# Patient Record
Sex: Male | Born: 1950 | Race: White | Hispanic: No | Marital: Married | Smoking: Former smoker
Health system: Southern US, Community
[De-identification: ages and names within clinical notes are randomized; demographics above are authoritative.]

## PROBLEM LIST (undated history)

## (undated) DIAGNOSIS — R51 Headache: Secondary | ICD-10-CM

## (undated) DIAGNOSIS — Z860100 Personal history of colon polyps, unspecified: Secondary | ICD-10-CM

## (undated) DIAGNOSIS — K449 Diaphragmatic hernia without obstruction or gangrene: Secondary | ICD-10-CM

## (undated) DIAGNOSIS — N289 Disorder of kidney and ureter, unspecified: Secondary | ICD-10-CM

## (undated) DIAGNOSIS — A088 Other specified intestinal infections: Secondary | ICD-10-CM

## (undated) DIAGNOSIS — B009 Herpesviral infection, unspecified: Secondary | ICD-10-CM

## (undated) DIAGNOSIS — Z86718 Personal history of other venous thrombosis and embolism: Secondary | ICD-10-CM

## (undated) DIAGNOSIS — D689 Coagulation defect, unspecified: Secondary | ICD-10-CM

## (undated) DIAGNOSIS — M1990 Unspecified osteoarthritis, unspecified site: Secondary | ICD-10-CM

## (undated) DIAGNOSIS — Z87442 Personal history of urinary calculi: Secondary | ICD-10-CM

## (undated) DIAGNOSIS — E785 Hyperlipidemia, unspecified: Secondary | ICD-10-CM

## (undated) DIAGNOSIS — I10 Essential (primary) hypertension: Principal | ICD-10-CM

## (undated) DIAGNOSIS — N200 Calculus of kidney: Secondary | ICD-10-CM

## (undated) DIAGNOSIS — IMO0002 Reserved for concepts with insufficient information to code with codable children: Secondary | ICD-10-CM

## (undated) DIAGNOSIS — K219 Gastro-esophageal reflux disease without esophagitis: Secondary | ICD-10-CM

## (undated) HISTORY — PX: GALLBLADDER SURGERY: SHX652

## (undated) HISTORY — DX: Diaphragmatic hernia without obstruction or gangrene: K44.9

## (undated) HISTORY — DX: Personal history of colon polyps, unspecified: Z86.0100

## (undated) HISTORY — DX: Herpesviral infection, unspecified: B00.9

## (undated) HISTORY — DX: Headache: R51

## (undated) HISTORY — PX: FINGER SURGERY: SHX640

## (undated) HISTORY — DX: Other specified intestinal infections: A08.8

## (undated) HISTORY — DX: Calculus of kidney: N20.0

## (undated) HISTORY — DX: Hyperlipidemia, unspecified: E78.5

## (undated) HISTORY — PX: APPENDECTOMY: SHX54

## (undated) HISTORY — PX: CHOLECYSTECTOMY: SHX55

## (undated) HISTORY — PX: TONSILLECTOMY: SUR1361

## (undated) HISTORY — PX: LITHOTRIPSY: SUR834

## (undated) HISTORY — PX: SHOULDER SURGERY: SHX246

## (undated) HISTORY — DX: Essential (primary) hypertension: I10

## (undated) HISTORY — DX: Personal history of colonic polyps: Z86.010

## (undated) HISTORY — DX: Coagulation defect, unspecified: D68.9

## (undated) HISTORY — PX: CERVICAL DISC SURGERY: SHX588

## (undated) HISTORY — DX: Gastro-esophageal reflux disease without esophagitis: K21.9

## (undated) HISTORY — DX: Reserved for concepts with insufficient information to code with codable children: IMO0002

## (undated) HISTORY — DX: Personal history of other venous thrombosis and embolism: Z86.718

## (undated) HISTORY — DX: Personal history of urinary calculi: Z87.442

---

## 1997-06-13 ENCOUNTER — Emergency Department (HOSPITAL_COMMUNITY): Admission: EM | Admit: 1997-06-13 | Discharge: 1997-06-13 | Payer: Self-pay | Admitting: Emergency Medicine

## 1997-12-16 ENCOUNTER — Emergency Department (HOSPITAL_COMMUNITY): Admission: EM | Admit: 1997-12-16 | Discharge: 1997-12-16 | Payer: Self-pay | Admitting: Emergency Medicine

## 1997-12-16 ENCOUNTER — Encounter: Payer: Self-pay | Admitting: Emergency Medicine

## 1998-06-03 ENCOUNTER — Ambulatory Visit (HOSPITAL_COMMUNITY): Admission: RE | Admit: 1998-06-03 | Discharge: 1998-06-03 | Payer: Self-pay | Admitting: Gastroenterology

## 1998-06-03 ENCOUNTER — Encounter: Payer: Self-pay | Admitting: Gastroenterology

## 1998-07-29 ENCOUNTER — Ambulatory Visit (HOSPITAL_COMMUNITY): Admission: RE | Admit: 1998-07-29 | Discharge: 1998-07-29 | Payer: Self-pay | Admitting: Gastroenterology

## 1998-07-29 ENCOUNTER — Encounter: Payer: Self-pay | Admitting: Gastroenterology

## 1998-10-01 ENCOUNTER — Ambulatory Visit (HOSPITAL_COMMUNITY): Admission: RE | Admit: 1998-10-01 | Discharge: 1998-10-01 | Payer: Self-pay | Admitting: Gastroenterology

## 1998-10-01 ENCOUNTER — Encounter (INDEPENDENT_AMBULATORY_CARE_PROVIDER_SITE_OTHER): Payer: Self-pay | Admitting: Specialist

## 1999-08-24 ENCOUNTER — Emergency Department (HOSPITAL_COMMUNITY): Admission: EM | Admit: 1999-08-24 | Discharge: 1999-08-25 | Payer: Self-pay | Admitting: Emergency Medicine

## 2000-02-15 ENCOUNTER — Emergency Department (HOSPITAL_COMMUNITY): Admission: EM | Admit: 2000-02-15 | Discharge: 2000-02-15 | Payer: Self-pay | Admitting: Emergency Medicine

## 2000-02-15 ENCOUNTER — Encounter: Payer: Self-pay | Admitting: Emergency Medicine

## 2000-06-29 ENCOUNTER — Encounter: Payer: Self-pay | Admitting: Neurosurgery

## 2000-06-29 ENCOUNTER — Ambulatory Visit (HOSPITAL_COMMUNITY): Admission: RE | Admit: 2000-06-29 | Discharge: 2000-06-29 | Payer: Self-pay | Admitting: Neurosurgery

## 2000-12-31 ENCOUNTER — Emergency Department (HOSPITAL_COMMUNITY): Admission: EM | Admit: 2000-12-31 | Discharge: 2001-01-01 | Payer: Self-pay | Admitting: Emergency Medicine

## 2001-01-28 ENCOUNTER — Encounter: Payer: Self-pay | Admitting: Family Medicine

## 2001-01-28 ENCOUNTER — Encounter: Admission: RE | Admit: 2001-01-28 | Discharge: 2001-01-28 | Payer: Self-pay | Admitting: Family Medicine

## 2001-08-14 ENCOUNTER — Encounter: Payer: Self-pay | Admitting: Family Medicine

## 2001-08-14 ENCOUNTER — Encounter: Admission: RE | Admit: 2001-08-14 | Discharge: 2001-08-14 | Payer: Self-pay | Admitting: Family Medicine

## 2001-08-24 ENCOUNTER — Ambulatory Visit (HOSPITAL_COMMUNITY): Admission: RE | Admit: 2001-08-24 | Discharge: 2001-08-24 | Payer: Self-pay | Admitting: Gastroenterology

## 2001-08-24 ENCOUNTER — Encounter: Payer: Self-pay | Admitting: Gastroenterology

## 2001-08-24 ENCOUNTER — Encounter: Payer: Self-pay | Admitting: Nurse Practitioner

## 2002-04-02 ENCOUNTER — Emergency Department (HOSPITAL_COMMUNITY): Admission: EM | Admit: 2002-04-02 | Discharge: 2002-04-02 | Payer: Self-pay | Admitting: Emergency Medicine

## 2002-04-02 ENCOUNTER — Encounter: Payer: Self-pay | Admitting: Emergency Medicine

## 2002-06-04 ENCOUNTER — Encounter: Admission: RE | Admit: 2002-06-04 | Discharge: 2002-09-02 | Payer: Self-pay | Admitting: Family Medicine

## 2002-09-18 ENCOUNTER — Encounter: Payer: Self-pay | Admitting: Gastroenterology

## 2002-09-18 ENCOUNTER — Encounter: Admission: RE | Admit: 2002-09-18 | Discharge: 2002-09-18 | Payer: Self-pay | Admitting: Gastroenterology

## 2002-09-27 ENCOUNTER — Ambulatory Visit (HOSPITAL_COMMUNITY): Admission: RE | Admit: 2002-09-27 | Discharge: 2002-09-27 | Payer: Self-pay | Admitting: Gastroenterology

## 2002-09-27 ENCOUNTER — Encounter: Payer: Self-pay | Admitting: Gastroenterology

## 2002-10-23 ENCOUNTER — Encounter: Admission: RE | Admit: 2002-10-23 | Discharge: 2002-10-23 | Payer: Self-pay | Admitting: Gastroenterology

## 2002-10-23 ENCOUNTER — Encounter: Payer: Self-pay | Admitting: Gastroenterology

## 2002-10-31 ENCOUNTER — Encounter: Payer: Self-pay | Admitting: Cardiology

## 2002-10-31 ENCOUNTER — Ambulatory Visit (HOSPITAL_COMMUNITY): Admission: RE | Admit: 2002-10-31 | Discharge: 2002-10-31 | Payer: Self-pay | Admitting: Cardiology

## 2002-11-08 ENCOUNTER — Encounter: Payer: Self-pay | Admitting: Nurse Practitioner

## 2002-11-08 ENCOUNTER — Ambulatory Visit (HOSPITAL_COMMUNITY): Admission: RE | Admit: 2002-11-08 | Discharge: 2002-11-08 | Payer: Self-pay | Admitting: Gastroenterology

## 2002-12-16 ENCOUNTER — Emergency Department (HOSPITAL_COMMUNITY): Admission: EM | Admit: 2002-12-16 | Discharge: 2002-12-16 | Payer: Self-pay | Admitting: Emergency Medicine

## 2003-02-12 ENCOUNTER — Encounter: Payer: Self-pay | Admitting: Nurse Practitioner

## 2003-08-20 ENCOUNTER — Encounter: Admission: RE | Admit: 2003-08-20 | Discharge: 2003-08-20 | Payer: Self-pay

## 2003-09-24 ENCOUNTER — Encounter: Payer: Self-pay | Admitting: Nurse Practitioner

## 2003-09-24 ENCOUNTER — Ambulatory Visit (HOSPITAL_COMMUNITY): Admission: RE | Admit: 2003-09-24 | Discharge: 2003-09-24 | Payer: Self-pay | Admitting: Gastroenterology

## 2004-02-01 ENCOUNTER — Ambulatory Visit: Payer: Self-pay | Admitting: Family Medicine

## 2004-02-03 ENCOUNTER — Ambulatory Visit: Payer: Self-pay | Admitting: Family Medicine

## 2004-02-14 ENCOUNTER — Ambulatory Visit: Payer: Self-pay | Admitting: Family Medicine

## 2004-04-10 ENCOUNTER — Ambulatory Visit: Payer: Self-pay | Admitting: Family Medicine

## 2004-07-29 ENCOUNTER — Ambulatory Visit (HOSPITAL_COMMUNITY): Admission: RE | Admit: 2004-07-29 | Discharge: 2004-07-29 | Payer: Self-pay | Admitting: Neurosurgery

## 2004-11-26 ENCOUNTER — Ambulatory Visit: Payer: Self-pay | Admitting: Family Medicine

## 2005-01-18 ENCOUNTER — Ambulatory Visit: Payer: Self-pay | Admitting: Family Medicine

## 2005-02-23 ENCOUNTER — Ambulatory Visit: Payer: Self-pay | Admitting: Family Medicine

## 2005-02-26 ENCOUNTER — Encounter: Payer: Self-pay | Admitting: Nurse Practitioner

## 2005-02-26 ENCOUNTER — Ambulatory Visit: Payer: Self-pay | Admitting: Family Medicine

## 2005-02-26 ENCOUNTER — Encounter: Admission: RE | Admit: 2005-02-26 | Discharge: 2005-02-26 | Payer: Self-pay | Admitting: Family Medicine

## 2005-03-03 ENCOUNTER — Ambulatory Visit: Payer: Self-pay | Admitting: Family Medicine

## 2005-05-05 ENCOUNTER — Ambulatory Visit: Payer: Self-pay | Admitting: Family Medicine

## 2005-05-24 ENCOUNTER — Encounter: Admission: RE | Admit: 2005-05-24 | Discharge: 2005-05-24 | Payer: Self-pay | Admitting: Gastroenterology

## 2005-06-14 ENCOUNTER — Encounter: Payer: Self-pay | Admitting: Nurse Practitioner

## 2005-06-22 ENCOUNTER — Ambulatory Visit: Payer: Self-pay | Admitting: Family Medicine

## 2005-07-02 ENCOUNTER — Encounter: Payer: Self-pay | Admitting: Nurse Practitioner

## 2005-08-23 ENCOUNTER — Ambulatory Visit (HOSPITAL_COMMUNITY): Admission: RE | Admit: 2005-08-23 | Discharge: 2005-08-23 | Payer: Self-pay | Admitting: Urology

## 2005-10-07 ENCOUNTER — Encounter: Payer: Self-pay | Admitting: Nurse Practitioner

## 2005-10-18 ENCOUNTER — Encounter: Admission: RE | Admit: 2005-10-18 | Discharge: 2005-10-18 | Payer: Self-pay | Admitting: Gastroenterology

## 2005-11-14 ENCOUNTER — Encounter: Admission: RE | Admit: 2005-11-14 | Discharge: 2005-11-14 | Payer: Self-pay | Admitting: Orthopedic Surgery

## 2006-01-31 ENCOUNTER — Ambulatory Visit: Payer: Self-pay | Admitting: Family Medicine

## 2006-04-26 ENCOUNTER — Ambulatory Visit: Payer: Self-pay | Admitting: Family Medicine

## 2006-06-15 ENCOUNTER — Ambulatory Visit: Payer: Self-pay | Admitting: Family Medicine

## 2006-06-22 ENCOUNTER — Ambulatory Visit: Payer: Self-pay | Admitting: Family Medicine

## 2006-06-22 LAB — CONVERTED CEMR LAB
ALT: 38 units/L (ref 0–40)
Albumin: 4 g/dL (ref 3.5–5.2)
Alkaline Phosphatase: 45 units/L (ref 39–117)
BUN: 19 mg/dL (ref 6–23)
Basophils Absolute: 0 10*3/uL (ref 0.0–0.1)
Basophils Relative: 0.7 % (ref 0.0–1.0)
CO2: 30 meq/L (ref 19–32)
Calcium: 9.7 mg/dL (ref 8.4–10.5)
Cholesterol: 192 mg/dL (ref 0–200)
Direct LDL: 119.6 mg/dL
GFR calc Af Amer: 62 mL/min
HDL: 30.9 mg/dL — ABNORMAL LOW (ref >39.0)
Lymphocytes Relative: 29.1 % (ref 12.0–46.0)
MCHC: 34.9 g/dL (ref 30.0–36.0)
Monocytes Relative: 9.2 % (ref 3.0–11.0)
Neutro Abs: 3 10*3/uL (ref 1.4–7.7)
Platelets: 242 10*3/uL (ref 150–400)
RDW: 12.5 % (ref 11.5–14.6)
TSH: 2.13 microintl units/mL (ref 0.35–5.50)
Total CHOL/HDL Ratio: 6.2
Total Protein: 6.7 g/dL (ref 6.0–8.3)
VLDL: 45 mg/dL — ABNORMAL HIGH (ref 0–40)

## 2006-07-01 ENCOUNTER — Ambulatory Visit: Payer: Self-pay | Admitting: Family Medicine

## 2006-08-15 ENCOUNTER — Encounter: Payer: Self-pay | Admitting: Family Medicine

## 2006-10-24 ENCOUNTER — Ambulatory Visit: Payer: Self-pay | Admitting: Family Medicine

## 2006-10-24 DIAGNOSIS — Z87442 Personal history of urinary calculi: Secondary | ICD-10-CM | POA: Insufficient documentation

## 2006-10-24 DIAGNOSIS — B009 Herpesviral infection, unspecified: Secondary | ICD-10-CM | POA: Insufficient documentation

## 2006-10-24 DIAGNOSIS — K219 Gastro-esophageal reflux disease without esophagitis: Secondary | ICD-10-CM

## 2006-10-24 DIAGNOSIS — Z8719 Personal history of other diseases of the digestive system: Secondary | ICD-10-CM

## 2006-10-24 DIAGNOSIS — E785 Hyperlipidemia, unspecified: Secondary | ICD-10-CM | POA: Insufficient documentation

## 2006-10-24 DIAGNOSIS — Z87898 Personal history of other specified conditions: Secondary | ICD-10-CM | POA: Insufficient documentation

## 2006-10-24 DIAGNOSIS — G43909 Migraine, unspecified, not intractable, without status migrainosus: Secondary | ICD-10-CM

## 2006-10-27 ENCOUNTER — Telehealth (INDEPENDENT_AMBULATORY_CARE_PROVIDER_SITE_OTHER): Payer: Self-pay | Admitting: *Deleted

## 2006-10-28 ENCOUNTER — Telehealth: Payer: Self-pay | Admitting: Family Medicine

## 2006-10-31 DIAGNOSIS — IMO0002 Reserved for concepts with insufficient information to code with codable children: Secondary | ICD-10-CM | POA: Insufficient documentation

## 2006-11-02 ENCOUNTER — Telehealth (INDEPENDENT_AMBULATORY_CARE_PROVIDER_SITE_OTHER): Payer: Self-pay | Admitting: *Deleted

## 2006-11-03 ENCOUNTER — Telehealth (INDEPENDENT_AMBULATORY_CARE_PROVIDER_SITE_OTHER): Payer: Self-pay | Admitting: *Deleted

## 2006-11-16 ENCOUNTER — Telehealth: Payer: Self-pay | Admitting: Internal Medicine

## 2007-01-18 ENCOUNTER — Ambulatory Visit: Payer: Self-pay | Admitting: Family Medicine

## 2007-01-26 ENCOUNTER — Telehealth: Payer: Self-pay | Admitting: Family Medicine

## 2007-01-27 ENCOUNTER — Telehealth: Payer: Self-pay | Admitting: Family Medicine

## 2007-02-01 ENCOUNTER — Telehealth: Payer: Self-pay | Admitting: Family Medicine

## 2007-03-01 ENCOUNTER — Inpatient Hospital Stay (HOSPITAL_COMMUNITY): Admission: AD | Admit: 2007-03-01 | Discharge: 2007-03-03 | Payer: Self-pay | Admitting: Family Medicine

## 2007-03-01 ENCOUNTER — Ambulatory Visit: Payer: Self-pay | Admitting: Internal Medicine

## 2007-03-01 ENCOUNTER — Ambulatory Visit: Payer: Self-pay | Admitting: Family Medicine

## 2007-03-01 ENCOUNTER — Ambulatory Visit: Payer: Self-pay

## 2007-03-01 DIAGNOSIS — I82409 Acute embolism and thrombosis of unspecified deep veins of unspecified lower extremity: Secondary | ICD-10-CM | POA: Insufficient documentation

## 2007-03-02 LAB — CONVERTED CEMR LAB
ALT: 31 units/L (ref 0–53)
AST: 24 units/L (ref 0–37)
Basophils Relative: 1 % (ref 0.0–1.0)
Bilirubin, Direct: 0.1 mg/dL (ref 0.0–0.3)
Calcium: 9.4 mg/dL (ref 8.4–10.5)
Chloride: 104 meq/L (ref 96–112)
Creatinine, Ser: 1.2 mg/dL (ref 0.4–1.5)
Eosinophils Relative: 2.7 % (ref 0.0–5.0)
GFR calc non Af Amer: 66 mL/min
Glucose, Bld: 119 mg/dL — ABNORMAL HIGH (ref 70–99)
HCT: 44.2 % (ref 39.0–52.0)
INR: 1.1 — ABNORMAL HIGH (ref 0.8–1.0)
Neutrophils Relative %: 64.1 % (ref 43.0–77.0)
Platelets: 285 10*3/uL (ref 150–400)
Prothrombin Time: 12.9 s (ref 10.9–13.3)
RBC: 5.02 M/uL (ref 4.22–5.81)
RDW: 12.4 % (ref 11.5–14.6)
Sodium: 138 meq/L (ref 135–145)
Total Bilirubin: 1 mg/dL (ref 0.3–1.2)
WBC: 6.7 10*3/uL (ref 4.5–10.5)
aPTT: 31.7 s — ABNORMAL HIGH (ref 21.7–29.8)

## 2007-03-06 ENCOUNTER — Ambulatory Visit: Payer: Self-pay | Admitting: Family Medicine

## 2007-03-06 DIAGNOSIS — Z86718 Personal history of other venous thrombosis and embolism: Secondary | ICD-10-CM | POA: Insufficient documentation

## 2007-03-07 ENCOUNTER — Ambulatory Visit: Payer: Self-pay | Admitting: Hematology & Oncology

## 2007-03-08 LAB — CONVERTED CEMR LAB
AntiThromb III Func: 123 % (ref 76–126)
Anticardiolipin IgA: <7 (ref <13)
Anticardiolipin IgM: <7 (ref <10)
Homocysteine: 17.1 micromoles/L — ABNORMAL HIGH (ref 4.0–15.4)
Protein S Ag, Total: 190 % — ABNORMAL HIGH (ref 70–140)

## 2007-03-15 ENCOUNTER — Ambulatory Visit: Payer: Self-pay | Admitting: Family Medicine

## 2007-03-15 LAB — CONVERTED CEMR LAB: Prothrombin Time: 21 s

## 2007-03-29 ENCOUNTER — Ambulatory Visit: Payer: Self-pay | Admitting: Family Medicine

## 2007-03-29 LAB — CONVERTED CEMR LAB: INR: 3.8

## 2007-04-07 ENCOUNTER — Encounter: Payer: Self-pay | Admitting: Family Medicine

## 2007-04-10 LAB — BASIC METABOLIC PANEL
CO2: 26 mEq/L (ref 19–32)
Calcium: 9.1 mg/dL (ref 8.4–10.5)
Sodium: 139 mEq/L (ref 135–145)

## 2007-04-11 ENCOUNTER — Ambulatory Visit (HOSPITAL_COMMUNITY): Admission: RE | Admit: 2007-04-11 | Discharge: 2007-04-11 | Payer: Self-pay | Admitting: Hematology & Oncology

## 2007-04-12 ENCOUNTER — Encounter: Payer: Self-pay | Admitting: Family Medicine

## 2007-04-12 ENCOUNTER — Ambulatory Visit: Payer: Self-pay | Admitting: Vascular Surgery

## 2007-04-13 ENCOUNTER — Ambulatory Visit: Payer: Self-pay | Admitting: Family Medicine

## 2007-04-13 LAB — CONVERTED CEMR LAB
INR: 2.3
Prothrombin Time: 18.6 s

## 2007-04-14 ENCOUNTER — Telehealth: Payer: Self-pay | Admitting: Family Medicine

## 2007-05-02 ENCOUNTER — Telehealth: Payer: Self-pay | Admitting: Family Medicine

## 2007-05-04 ENCOUNTER — Ambulatory Visit: Payer: Self-pay | Admitting: Hematology & Oncology

## 2007-05-08 ENCOUNTER — Encounter: Payer: Self-pay | Admitting: Family Medicine

## 2007-05-08 LAB — CBC WITH DIFFERENTIAL/PLATELET
BASO%: 0.1 % (ref 0.0–2.0)
Basophils Absolute: 0 10*3/uL (ref 0.0–0.1)
Eosinophils Absolute: 0.2 10*3/uL (ref 0.0–0.5)
HCT: 41.2 % (ref 38.7–49.9)
HGB: 14.4 g/dL (ref 13.0–17.1)
MCHC: 35 g/dL (ref 32.0–35.9)
MONO#: 0.4 10*3/uL (ref 0.1–0.9)
NEUT#: 4 10*3/uL (ref 1.5–6.5)
NEUT%: 67.5 % (ref 40.0–75.0)
WBC: 5.9 10*3/uL (ref 4.0–10.0)
lymph#: 1.3 10*3/uL (ref 0.9–3.3)

## 2007-05-08 LAB — D-DIMER, QUANTITATIVE: D-Dimer, Quant: 0.22 ug/mL-FEU (ref 0.00–0.48)

## 2007-05-08 LAB — PROTIME-INR: INR: 2.7 (ref 2.00–3.50)

## 2007-05-09 ENCOUNTER — Ambulatory Visit: Payer: Self-pay | Admitting: Family Medicine

## 2007-06-06 ENCOUNTER — Ambulatory Visit: Payer: Self-pay | Admitting: Family Medicine

## 2007-06-06 LAB — CONVERTED CEMR LAB: INR: 3.8

## 2007-06-12 ENCOUNTER — Encounter: Admission: RE | Admit: 2007-06-12 | Discharge: 2007-06-12 | Payer: Self-pay | Admitting: Family Medicine

## 2007-06-19 ENCOUNTER — Ambulatory Visit (HOSPITAL_COMMUNITY): Admission: RE | Admit: 2007-06-19 | Discharge: 2007-06-19 | Payer: Self-pay | Admitting: Hematology & Oncology

## 2007-06-21 ENCOUNTER — Ambulatory Visit: Payer: Self-pay | Admitting: Family Medicine

## 2007-06-21 DIAGNOSIS — M25569 Pain in unspecified knee: Secondary | ICD-10-CM | POA: Insufficient documentation

## 2007-06-26 ENCOUNTER — Ambulatory Visit: Payer: Self-pay | Admitting: Hematology & Oncology

## 2007-06-28 ENCOUNTER — Encounter: Payer: Self-pay | Admitting: Family Medicine

## 2007-07-19 ENCOUNTER — Ambulatory Visit: Payer: Self-pay | Admitting: Family Medicine

## 2007-07-20 LAB — CONVERTED CEMR LAB
ALT: 44 units/L (ref 0–53)
AST: 39 units/L — ABNORMAL HIGH (ref 0–37)
Albumin: 4 g/dL (ref 3.5–5.2)
BUN: 18 mg/dL (ref 6–23)
CO2: 26 meq/L (ref 19–32)
Chloride: 108 meq/L (ref 96–112)
Cholesterol: 204 mg/dL (ref 0–200)
Creatinine, Ser: 1.4 mg/dL (ref 0.4–1.5)
Direct LDL: 137.1 mg/dL
HCT: 43.3 % (ref 39.0–52.0)
Hemoglobin: 15.1 g/dL (ref 13.0–17.0)
MCHC: 34.9 g/dL (ref 30.0–36.0)
Monocytes Absolute: 0.5 10*3/uL (ref 0.1–1.0)
Neutro Abs: 2.5 10*3/uL (ref 1.4–7.7)
Platelets: 232 10*3/uL (ref 150–400)
RDW: 13.1 % (ref 11.5–14.6)
Total CHOL/HDL Ratio: 5.9
Triglycerides: 229 mg/dL (ref 0–149)

## 2007-08-16 ENCOUNTER — Ambulatory Visit: Payer: Self-pay | Admitting: Family Medicine

## 2007-08-16 LAB — CONVERTED CEMR LAB: INR: 3.1

## 2007-09-13 ENCOUNTER — Ambulatory Visit: Payer: Self-pay | Admitting: Family Medicine

## 2007-09-13 LAB — CONVERTED CEMR LAB: INR: 3.2

## 2007-10-06 ENCOUNTER — Ambulatory Visit: Payer: Self-pay | Admitting: Hematology & Oncology

## 2007-10-10 ENCOUNTER — Ambulatory Visit (HOSPITAL_COMMUNITY): Admission: RE | Admit: 2007-10-10 | Discharge: 2007-10-10 | Payer: Self-pay | Admitting: Hematology & Oncology

## 2007-10-10 LAB — COMPREHENSIVE METABOLIC PANEL WITH GFR
ALT: 40 U/L (ref 0–53)
AST: 36 U/L (ref 0–37)
Albumin: 4 g/dL (ref 3.5–5.2)
Alkaline Phosphatase: 55 U/L (ref 39–117)
BUN: 11 mg/dL (ref 6–23)
CO2: 28 meq/L (ref 19–32)
Calcium: 9 mg/dL (ref 8.4–10.5)
Chloride: 106 meq/L (ref 96–112)
Creatinine, Ser: 1.35 mg/dL (ref 0.40–1.50)
Glucose, Bld: 100 mg/dL — ABNORMAL HIGH (ref 70–99)
Potassium: 4 meq/L (ref 3.5–5.3)
Sodium: 140 meq/L (ref 135–145)
Total Bilirubin: 1 mg/dL (ref 0.3–1.2)
Total Protein: 6.6 g/dL (ref 6.0–8.3)

## 2007-10-11 ENCOUNTER — Ambulatory Visit: Payer: Self-pay | Admitting: Family Medicine

## 2007-10-12 LAB — CONVERTED CEMR LAB
Testosterone: 273.87 ng/dL — ABNORMAL LOW (ref 350.00–890)
Vitamin B-12: 396 pg/mL (ref 211–911)

## 2007-10-17 ENCOUNTER — Ambulatory Visit: Payer: Self-pay | Admitting: Hematology & Oncology

## 2007-10-18 ENCOUNTER — Encounter: Payer: Self-pay | Admitting: Family Medicine

## 2007-10-18 ENCOUNTER — Ambulatory Visit (HOSPITAL_BASED_OUTPATIENT_CLINIC_OR_DEPARTMENT_OTHER): Admission: RE | Admit: 2007-10-18 | Discharge: 2007-10-18 | Payer: Self-pay | Admitting: Hematology & Oncology

## 2007-11-08 ENCOUNTER — Ambulatory Visit: Payer: Self-pay | Admitting: Family Medicine

## 2007-11-08 LAB — CONVERTED CEMR LAB: INR: 2.9

## 2007-11-17 ENCOUNTER — Emergency Department (HOSPITAL_COMMUNITY): Admission: EM | Admit: 2007-11-17 | Discharge: 2007-11-17 | Payer: Self-pay | Admitting: Family Medicine

## 2007-11-17 ENCOUNTER — Telehealth: Payer: Self-pay | Admitting: Family Medicine

## 2007-12-08 ENCOUNTER — Ambulatory Visit: Payer: Self-pay | Admitting: Family Medicine

## 2007-12-08 LAB — CONVERTED CEMR LAB: Prothrombin Time: 19.2 s

## 2008-01-04 ENCOUNTER — Ambulatory Visit: Payer: Self-pay | Admitting: Family Medicine

## 2008-01-10 ENCOUNTER — Ambulatory Visit: Payer: Self-pay | Admitting: Family Medicine

## 2008-01-24 ENCOUNTER — Ambulatory Visit: Payer: Self-pay | Admitting: Hematology & Oncology

## 2008-01-25 ENCOUNTER — Encounter: Payer: Self-pay | Admitting: Family Medicine

## 2008-01-25 LAB — CBC WITH DIFFERENTIAL (CANCER CENTER ONLY)
BASO%: 0.9 % (ref 0.0–2.0)
EOS%: 3.5 % (ref 0.0–7.0)
Eosinophils Absolute: 0.2 10*3/uL (ref 0.0–0.5)
LYMPH%: 26.3 % (ref 14.0–48.0)
MCH: 29.5 pg (ref 28.0–33.4)
MCHC: 34.3 g/dL (ref 32.0–35.9)
MCV: 86 fL (ref 82–98)
MONO%: 7 % (ref 0.0–13.0)
NEUT#: 3.5 10*3/uL (ref 1.5–6.5)
Platelets: 217 10*3/uL (ref 145–400)
RBC: 5.26 10*6/uL (ref 4.20–5.70)
RDW: 11.8 % (ref 10.5–14.6)

## 2008-01-25 LAB — PROTIME-INR (CHCC SATELLITE)
INR: 2.9 (ref 2.0–3.5)
Protime: 34.8 Seconds — ABNORMAL HIGH (ref 10.6–13.4)

## 2008-02-29 ENCOUNTER — Ambulatory Visit: Payer: Self-pay | Admitting: Family Medicine

## 2008-02-29 LAB — CONVERTED CEMR LAB: Prothrombin Time: 18.1 s

## 2008-03-14 ENCOUNTER — Ambulatory Visit: Payer: Self-pay | Admitting: Family Medicine

## 2008-03-14 LAB — CONVERTED CEMR LAB
INR: 3.1
Prothrombin Time: 21.1 s

## 2008-03-28 ENCOUNTER — Telehealth: Payer: Self-pay | Admitting: Family Medicine

## 2008-04-08 ENCOUNTER — Ambulatory Visit: Payer: Self-pay | Admitting: Family Medicine

## 2008-04-08 LAB — CONVERTED CEMR LAB: Prothrombin Time: 21.6 s

## 2008-05-07 ENCOUNTER — Ambulatory Visit: Payer: Self-pay | Admitting: Family Medicine

## 2008-05-07 LAB — CONVERTED CEMR LAB: INR: 2.7

## 2008-06-04 ENCOUNTER — Ambulatory Visit: Payer: Self-pay | Admitting: Family Medicine

## 2008-07-03 ENCOUNTER — Ambulatory Visit: Payer: Self-pay | Admitting: Family Medicine

## 2008-07-03 LAB — CONVERTED CEMR LAB: INR: 3.6

## 2008-07-17 ENCOUNTER — Ambulatory Visit: Payer: Self-pay | Admitting: Hematology & Oncology

## 2008-07-18 ENCOUNTER — Encounter: Payer: Self-pay | Admitting: Family Medicine

## 2008-07-18 LAB — COMPREHENSIVE METABOLIC PANEL
Albumin: 4.4 g/dL (ref 3.5–5.2)
Alkaline Phosphatase: 52 U/L (ref 39–117)
BUN: 22 mg/dL (ref 6–23)
CO2: 25 mEq/L (ref 19–32)
Glucose, Bld: 70 mg/dL (ref 70–99)
Potassium: 4.3 mEq/L (ref 3.5–5.3)
Sodium: 139 mEq/L (ref 135–145)
Total Protein: 7 g/dL (ref 6.0–8.3)

## 2008-07-18 LAB — CBC WITH DIFFERENTIAL (CANCER CENTER ONLY)
BASO#: 0 10*3/uL (ref 0.0–0.2)
EOS%: 2.9 % (ref 0.0–7.0)
HGB: 14.1 g/dL (ref 13.0–17.1)
LYMPH%: 28.8 % (ref 14.0–48.0)
MCH: 29.5 pg (ref 28.0–33.4)
MCHC: 33.1 g/dL (ref 32.0–35.9)
MCV: 89 fL (ref 82–98)
MONO%: 7.5 % (ref 0.0–13.0)
NEUT#: 2.9 10*3/uL (ref 1.5–6.5)

## 2008-07-18 LAB — PROTIME-INR (CHCC SATELLITE): Protime: 33.6 Seconds — ABNORMAL HIGH (ref 10.6–13.4)

## 2008-07-18 LAB — LACTATE DEHYDROGENASE: LDH: 171 U/L (ref 94–250)

## 2008-07-24 ENCOUNTER — Ambulatory Visit: Payer: Self-pay | Admitting: Family Medicine

## 2008-07-24 LAB — CONVERTED CEMR LAB
Bilirubin Urine: NEGATIVE
Ketones, urine, test strip: NEGATIVE
Nitrite: NEGATIVE
Specific Gravity, Urine: 1.025
pH: 6

## 2008-07-26 LAB — CONVERTED CEMR LAB
Albumin: 4.1 g/dL (ref 3.5–5.2)
Alkaline Phosphatase: 55 units/L (ref 39–117)
BUN: 20 mg/dL (ref 6–23)
Basophils Absolute: 0 10*3/uL (ref 0.0–0.1)
Basophils Relative: 0.3 % (ref 0.0–3.0)
Bilirubin, Direct: 0.1 mg/dL (ref 0.0–0.3)
CO2: 30 meq/L (ref 19–32)
Calcium: 8.9 mg/dL (ref 8.4–10.5)
Chloride: 107 meq/L (ref 96–112)
Cholesterol: 192 mg/dL (ref 0–200)
Creatinine, Ser: 1.4 mg/dL (ref 0.4–1.5)
Eosinophils Absolute: 0.1 10*3/uL (ref 0.0–0.7)
Glucose, Bld: 97 mg/dL (ref 70–99)
HDL: 38.3 mg/dL — ABNORMAL LOW (ref >39.00)
Lymphocytes Relative: 30.7 % (ref 12.0–46.0)
MCHC: 35.2 g/dL (ref 30.0–36.0)
MCV: 88 fL (ref 78.0–100.0)
Monocytes Absolute: 0.4 10*3/uL (ref 0.1–1.0)
Neutrophils Relative %: 57 % (ref 43.0–77.0)
PSA: 0.5 ng/mL (ref 0.10–4.00)
Platelets: 224 10*3/uL (ref 150.0–400.0)
RDW: 12.6 % (ref 11.5–14.6)
Total CHOL/HDL Ratio: 5
Total Protein: 7 g/dL (ref 6.0–8.3)
Triglycerides: 184 mg/dL — ABNORMAL HIGH (ref 0.0–149.0)

## 2008-07-31 ENCOUNTER — Ambulatory Visit: Payer: Self-pay | Admitting: Family Medicine

## 2008-07-31 DIAGNOSIS — G609 Hereditary and idiopathic neuropathy, unspecified: Secondary | ICD-10-CM

## 2008-07-31 LAB — CONVERTED CEMR LAB
INR: 4.4
Prothrombin Time: 25.3 s

## 2008-08-08 ENCOUNTER — Ambulatory Visit: Payer: Self-pay | Admitting: Family Medicine

## 2008-08-08 LAB — CONVERTED CEMR LAB: INR: 3.9

## 2008-08-30 ENCOUNTER — Encounter: Payer: Self-pay | Admitting: Family Medicine

## 2008-09-04 ENCOUNTER — Ambulatory Visit: Payer: Self-pay | Admitting: Hematology & Oncology

## 2008-09-05 ENCOUNTER — Encounter: Payer: Self-pay | Admitting: Family Medicine

## 2008-09-05 LAB — CBC WITH DIFFERENTIAL (CANCER CENTER ONLY)
BASO#: 0 10*3/uL (ref 0.0–0.2)
EOS%: 3.4 % (ref 0.0–7.0)
Eosinophils Absolute: 0.2 10*3/uL (ref 0.0–0.5)
HCT: 45.2 % (ref 38.7–49.9)
HGB: 15.4 g/dL (ref 13.0–17.1)
LYMPH#: 1.7 10*3/uL (ref 0.9–3.3)
MCH: 30 pg (ref 28.0–33.4)
MCHC: 34 g/dL (ref 32.0–35.9)
MONO%: 7.5 % (ref 0.0–13.0)
NEUT%: 56.1 % (ref 40.0–80.0)

## 2008-09-05 LAB — PROTIME-INR (CHCC SATELLITE): Protime: 42 Seconds — ABNORMAL HIGH (ref 10.6–13.4)

## 2008-09-18 ENCOUNTER — Encounter: Payer: Self-pay | Admitting: Family Medicine

## 2008-09-18 ENCOUNTER — Emergency Department (HOSPITAL_COMMUNITY): Admission: EM | Admit: 2008-09-18 | Discharge: 2008-09-18 | Payer: Self-pay | Admitting: Emergency Medicine

## 2008-09-18 ENCOUNTER — Telehealth: Payer: Self-pay | Admitting: Internal Medicine

## 2008-09-19 ENCOUNTER — Telehealth: Payer: Self-pay | Admitting: Family Medicine

## 2008-09-20 ENCOUNTER — Ambulatory Visit (HOSPITAL_BASED_OUTPATIENT_CLINIC_OR_DEPARTMENT_OTHER): Admission: RE | Admit: 2008-09-20 | Discharge: 2008-09-20 | Payer: Self-pay | Admitting: Urology

## 2008-09-26 ENCOUNTER — Ambulatory Visit (HOSPITAL_COMMUNITY): Admission: RE | Admit: 2008-09-26 | Discharge: 2008-09-26 | Payer: Self-pay | Admitting: Urology

## 2008-10-04 ENCOUNTER — Emergency Department (HOSPITAL_COMMUNITY): Admission: EM | Admit: 2008-10-04 | Discharge: 2008-10-04 | Payer: Self-pay | Admitting: Emergency Medicine

## 2008-10-04 ENCOUNTER — Ambulatory Visit: Payer: Self-pay | Admitting: Family Medicine

## 2008-10-04 DIAGNOSIS — R1011 Right upper quadrant pain: Secondary | ICD-10-CM

## 2008-10-04 LAB — CONVERTED CEMR LAB
Glucose, Urine, Semiquant: NEGATIVE
Nitrite: NEGATIVE
Protein, U semiquant: NEGATIVE
WBC Urine, dipstick: NEGATIVE

## 2008-10-05 ENCOUNTER — Telehealth: Payer: Self-pay | Admitting: Family Medicine

## 2008-10-05 DIAGNOSIS — K8020 Calculus of gallbladder without cholecystitis without obstruction: Secondary | ICD-10-CM | POA: Insufficient documentation

## 2008-10-29 ENCOUNTER — Emergency Department (HOSPITAL_COMMUNITY): Admission: EM | Admit: 2008-10-29 | Discharge: 2008-10-30 | Payer: Self-pay | Admitting: Emergency Medicine

## 2008-11-05 ENCOUNTER — Ambulatory Visit: Payer: Self-pay | Admitting: Gastroenterology

## 2008-11-06 LAB — CONVERTED CEMR LAB
ALT: 50 units/L (ref 0–53)
AST: 29 units/L (ref 0–37)
Alkaline Phosphatase: 61 units/L (ref 39–117)
Basophils Absolute: 0 10*3/uL (ref 0.0–0.1)
Creatinine, Ser: 1.6 mg/dL — ABNORMAL HIGH (ref 0.4–1.5)
Eosinophils Absolute: 0.1 10*3/uL (ref 0.0–0.7)
GFR calc non Af Amer: 47.09 mL/min (ref >60)
HCT: 42 % (ref 39.0–52.0)
Hemoglobin: 14.3 g/dL (ref 13.0–17.0)
Lymphs Abs: 1.5 10*3/uL (ref 0.7–4.0)
MCHC: 34 g/dL (ref 30.0–36.0)
Monocytes Absolute: 0.5 10*3/uL (ref 0.1–1.0)
Neutro Abs: 3.2 10*3/uL (ref 1.4–7.7)
Platelets: 237 10*3/uL (ref 150.0–400.0)
RDW: 12.3 % (ref 11.5–14.6)
Sodium: 141 meq/L (ref 135–145)
Total Bilirubin: 0.9 mg/dL (ref 0.3–1.2)

## 2008-11-13 ENCOUNTER — Ambulatory Visit (HOSPITAL_COMMUNITY): Admission: RE | Admit: 2008-11-13 | Discharge: 2008-11-14 | Payer: Self-pay | Admitting: General Surgery

## 2008-11-13 ENCOUNTER — Encounter (INDEPENDENT_AMBULATORY_CARE_PROVIDER_SITE_OTHER): Payer: Self-pay | Admitting: General Surgery

## 2008-11-26 ENCOUNTER — Encounter (INDEPENDENT_AMBULATORY_CARE_PROVIDER_SITE_OTHER): Payer: Self-pay | Admitting: *Deleted

## 2008-12-17 ENCOUNTER — Ambulatory Visit: Payer: Self-pay | Admitting: Hematology & Oncology

## 2008-12-19 ENCOUNTER — Encounter: Payer: Self-pay | Admitting: Family Medicine

## 2008-12-19 LAB — CBC WITH DIFFERENTIAL (CANCER CENTER ONLY)
BASO#: 0 10*3/uL (ref 0.0–0.2)
HCT: 42.8 % (ref 38.7–49.9)
HGB: 14.8 g/dL (ref 13.0–17.1)
LYMPH#: 1.7 10*3/uL (ref 0.9–3.3)
MONO#: 0.4 10*3/uL (ref 0.1–0.9)
NEUT%: 59.6 % (ref 40.0–80.0)
RBC: 4.99 10*6/uL (ref 4.20–5.70)
WBC: 6 10*3/uL (ref 4.0–10.0)

## 2009-01-07 ENCOUNTER — Telehealth: Payer: Self-pay | Admitting: Family Medicine

## 2009-01-26 ENCOUNTER — Inpatient Hospital Stay (HOSPITAL_COMMUNITY): Admission: EM | Admit: 2009-01-26 | Discharge: 2009-01-28 | Payer: Self-pay | Admitting: Emergency Medicine

## 2009-01-27 ENCOUNTER — Ambulatory Visit: Payer: Self-pay | Admitting: Gastroenterology

## 2009-02-03 ENCOUNTER — Encounter: Payer: Self-pay | Admitting: Family Medicine

## 2009-02-03 ENCOUNTER — Ambulatory Visit (HOSPITAL_COMMUNITY): Admission: RE | Admit: 2009-02-03 | Discharge: 2009-02-03 | Payer: Self-pay | Admitting: General Surgery

## 2009-03-21 ENCOUNTER — Telehealth: Payer: Self-pay | Admitting: Family Medicine

## 2009-03-27 ENCOUNTER — Telehealth: Payer: Self-pay | Admitting: Family Medicine

## 2009-07-28 ENCOUNTER — Ambulatory Visit: Payer: Self-pay | Admitting: Family Medicine

## 2009-07-28 LAB — CONVERTED CEMR LAB
Blood in Urine, dipstick: NEGATIVE
Ketones, urine, test strip: NEGATIVE
Nitrite: NEGATIVE
Protein, U semiquant: NEGATIVE
Specific Gravity, Urine: 1.025
Urobilinogen, UA: 0.2

## 2009-07-29 LAB — CONVERTED CEMR LAB
Alkaline Phosphatase: 53 units/L (ref 39–117)
Basophils Absolute: 0 10*3/uL (ref 0.0–0.1)
Bilirubin, Direct: 0.1 mg/dL (ref 0.0–0.3)
CO2: 31 meq/L (ref 19–32)
Calcium: 9.3 mg/dL (ref 8.4–10.5)
Creatinine, Ser: 1.4 mg/dL (ref 0.4–1.5)
Eosinophils Absolute: 0.2 10*3/uL (ref 0.0–0.7)
GFR calc non Af Amer: 53.92 mL/min (ref >60)
HDL: 38.2 mg/dL — ABNORMAL LOW (ref >39.00)
Lymphocytes Relative: 31.1 % (ref 12.0–46.0)
MCHC: 34.9 g/dL (ref 30.0–36.0)
Neutrophils Relative %: 56.6 % (ref 43.0–77.0)
RBC: 4.8 M/uL (ref 4.22–5.81)
RDW: 13.5 % (ref 11.5–14.6)
Total CHOL/HDL Ratio: 5
Triglycerides: 162 mg/dL — ABNORMAL HIGH (ref 0.0–149.0)
VLDL: 32.4 mg/dL (ref 0.0–40.0)

## 2009-08-04 ENCOUNTER — Ambulatory Visit: Payer: Self-pay | Admitting: Family Medicine

## 2009-10-23 ENCOUNTER — Ambulatory Visit: Payer: Self-pay | Admitting: Family Medicine

## 2009-10-23 DIAGNOSIS — A088 Other specified intestinal infections: Secondary | ICD-10-CM

## 2009-10-27 ENCOUNTER — Encounter: Payer: Self-pay | Admitting: Family Medicine

## 2010-01-14 ENCOUNTER — Encounter: Payer: Self-pay | Admitting: Family Medicine

## 2010-01-14 ENCOUNTER — Ambulatory Visit: Payer: Self-pay | Admitting: Cardiology

## 2010-01-14 ENCOUNTER — Ambulatory Visit: Admit: 2010-01-14 | Payer: Self-pay | Admitting: Family Medicine

## 2010-01-14 ENCOUNTER — Ambulatory Visit
Admission: RE | Admit: 2010-01-14 | Discharge: 2010-01-14 | Payer: Self-pay | Source: Home / Self Care | Attending: Family Medicine | Admitting: Family Medicine

## 2010-01-14 ENCOUNTER — Other Ambulatory Visit: Payer: Self-pay | Admitting: Family Medicine

## 2010-01-14 LAB — BASIC METABOLIC PANEL
BUN: 19 mg/dL (ref 6–23)
CO2: 29 mEq/L (ref 19–32)
Calcium: 9.5 mg/dL (ref 8.4–10.5)
Chloride: 105 mEq/L (ref 96–112)
Creatinine, Ser: 1.4 mg/dL (ref 0.4–1.5)
GFR: 57.07 mL/min — ABNORMAL LOW (ref >60.00)
Glucose, Bld: 82 mg/dL (ref 70–99)
Potassium: 4.5 mEq/L (ref 3.5–5.1)
Sodium: 141 mEq/L (ref 135–145)

## 2010-01-14 LAB — CBC WITH DIFFERENTIAL/PLATELET
Basophils Absolute: 0 10*3/uL (ref 0.0–0.1)
Basophils Relative: 0.5 % (ref 0.0–3.0)
Eosinophils Absolute: 0.2 10*3/uL (ref 0.0–0.7)
Eosinophils Relative: 3.2 % (ref 0.0–5.0)
HCT: 45.8 % (ref 39.0–52.0)
Hemoglobin: 16.1 g/dL (ref 13.0–17.0)
Lymphocytes Relative: 28.1 % (ref 12.0–46.0)
Lymphs Abs: 1.8 10*3/uL (ref 0.7–4.0)
MCHC: 35.1 g/dL (ref 30.0–36.0)
MCV: 89 fl (ref 78.0–100.0)
Monocytes Absolute: 0.5 10*3/uL (ref 0.1–1.0)
Monocytes Relative: 8.2 % (ref 3.0–12.0)
Neutro Abs: 3.9 10*3/uL (ref 1.4–7.7)
Neutrophils Relative %: 60 % (ref 43.0–77.0)
Platelets: 248 10*3/uL (ref 150.0–400.0)
RBC: 5.15 Mil/uL (ref 4.22–5.81)
RDW: 13.3 % (ref 11.5–14.6)
WBC: 6.4 10*3/uL (ref 4.5–10.5)

## 2010-01-14 LAB — HEPATIC FUNCTION PANEL
ALT: 44 U/L (ref 0–53)
AST: 43 U/L — ABNORMAL HIGH (ref 0–37)
Albumin: 4.3 g/dL (ref 3.5–5.2)
Alkaline Phosphatase: 53 U/L (ref 39–117)
Bilirubin, Direct: 0.2 mg/dL (ref 0.0–0.3)
Total Bilirubin: 0.9 mg/dL (ref 0.3–1.2)
Total Protein: 7.4 g/dL (ref 6.0–8.3)

## 2010-01-14 LAB — TSH: TSH: 2.27 u[IU]/mL (ref 0.35–5.50)

## 2010-01-20 ENCOUNTER — Telehealth: Payer: Self-pay | Admitting: Family Medicine

## 2010-01-20 ENCOUNTER — Telehealth (INDEPENDENT_AMBULATORY_CARE_PROVIDER_SITE_OTHER): Payer: Self-pay | Admitting: Radiology

## 2010-01-21 ENCOUNTER — Encounter: Payer: Self-pay | Admitting: *Deleted

## 2010-01-21 ENCOUNTER — Encounter (HOSPITAL_COMMUNITY)
Admission: RE | Admit: 2010-01-21 | Discharge: 2010-02-10 | Payer: Self-pay | Source: Home / Self Care | Attending: Family Medicine | Admitting: Family Medicine

## 2010-01-21 ENCOUNTER — Encounter: Payer: Self-pay | Admitting: Cardiology

## 2010-01-21 ENCOUNTER — Ambulatory Visit: Admission: RE | Admit: 2010-01-21 | Discharge: 2010-01-21 | Payer: Self-pay | Source: Home / Self Care

## 2010-01-27 ENCOUNTER — Telehealth: Payer: Self-pay | Admitting: Gastroenterology

## 2010-01-27 ENCOUNTER — Ambulatory Visit
Admission: RE | Admit: 2010-01-27 | Discharge: 2010-01-27 | Payer: Self-pay | Source: Home / Self Care | Attending: Family Medicine | Admitting: Family Medicine

## 2010-01-27 DIAGNOSIS — J984 Other disorders of lung: Secondary | ICD-10-CM | POA: Insufficient documentation

## 2010-01-30 ENCOUNTER — Encounter: Payer: Self-pay | Admitting: Gastroenterology

## 2010-01-30 ENCOUNTER — Ambulatory Visit
Admission: RE | Admit: 2010-01-30 | Discharge: 2010-01-30 | Payer: Self-pay | Source: Home / Self Care | Attending: Gastroenterology | Admitting: Gastroenterology

## 2010-01-30 DIAGNOSIS — Z860100 Personal history of colon polyps, unspecified: Secondary | ICD-10-CM | POA: Insufficient documentation

## 2010-01-30 DIAGNOSIS — R1319 Other dysphagia: Secondary | ICD-10-CM | POA: Insufficient documentation

## 2010-01-30 DIAGNOSIS — R1012 Left upper quadrant pain: Secondary | ICD-10-CM | POA: Insufficient documentation

## 2010-02-01 ENCOUNTER — Encounter: Payer: Self-pay | Admitting: Family Medicine

## 2010-02-01 ENCOUNTER — Encounter: Payer: Self-pay | Admitting: Hematology & Oncology

## 2010-02-12 NOTE — Progress Notes (Signed)
Summary: nuc pre-procedure  Phone Note Outgoing Call   Call placed by: Domenic Polite, CNMT,  January 20, 2010 2:06 PM Call placed to: Patient Reason for Call: Confirm/change Appt Summary of Call: Reviewed information on My[employer] Information Sheet (see scanned document for further details).  Spoke with patient's wife.      Nuclear Med Background Indications for Stress Test: Evaluation for Ischemia   History: CT/MRI, Heart Catheterization, Myocardial Perfusion Study  History Comments: '02 MPS-nml EF=58%; '04 Cath EF= 60% -nml coronary arteries; 01/14/10  CT/MRI neg. PE  Symptoms: Chest Pain, SOB    Nuclear Pre-Procedure Cardiac Risk Factors: Family History - CAD, History of Smoking, Lipids Height (in): 70

## 2010-02-12 NOTE — Letter (Signed)
Summary: [facility]   Imported By: Maryln Gottron 02/14/2009 15:41:47  _____________________________________________________________________  External Attachment:    Type:   Image     Comment:   External Document

## 2010-02-12 NOTE — Progress Notes (Signed)
Summary: REQ FOR REFILL (Zetia)  Phone Note Call from Patient   Caller: Spouse [phone] Reason for Call: Refill Medication Summary of Call: Pts wife called to req a refill on med: Zetia 10 Mg  Tabs (Ezetimibe)..... Pts wife adv that Rx needs to be sent / faxed into [facility].... Would like samples as well if available because pt is almost out of med and may not receive med from [facility] before he runs out.    Initial call taken by: Debbra Riding,  March 27, 2009 10:09 AM  Follow-up for Phone Call        he may have samples if we have some, but if he went without it for a week or so that would be okay. RX was written Follow-up by: Nelwyn Salisbury MD,  March 27, 2009 11:45 AM  Additional Follow-up for Phone Call Additional follow up Details #1::        called. Additional Follow-up by: Raechel Ache, RN,  March 27, 2009 11:56 AM    New/Updated Medications: ZETIA 10 MG  TABS (EZETIMIBE) 1 by mouth once daily Prescriptions: ZETIA 10 MG  TABS (EZETIMIBE) 1 by mouth once daily  #90 x 3   Entered and Authorized by:   Nelwyn Salisbury MD   Signed by:   Nelwyn Salisbury MD on 03/27/2009   Method used:   Print then Give to Patient   RxID:   [id number]  faxed to [facility]

## 2010-02-12 NOTE — Procedures (Signed)
Summary: ERCP  Patient: Steven [name] Note: All result statuses are Final unless otherwise noted.  Tests: (1) ERCP (ERC)   ERC ERCP                  DONE     [facility]     [address] [place], [place]  [address]           ERCP PROCEDURE REPORT           PATIENT:  Steven [name], Steven [name]  MR#:  [id number]     BIRTHDATE:  1950/10/21  GENDER:  male           ENDOSCOPIST:  Barbette Hair. Arlyce Dice, MD     ASSISTANT:           PROCEDURE DATE:  01/27/2009     PROCEDURE:  ERCP with removal of stones, ERCP with sphincterotomy,     ERCP with stent placement           INDICATIONS:  suspected stone           MEDICATIONS:   Fentanyl 100 mcg, Versed 10 mg, Benadryl 25 mg     TOPICAL ANESTHETIC:  Cetacaine Spray           DESCRIPTION OF PROCEDURE:   After the risks benefits and     alternatives of the procedure were thoroughly explained, informed     consent was obtained.  The  endoscope was introduced through the     mouth and advanced to the third portion of the duodenum.           A stone was found in the common bile duct. 1cm distal CBD stone.     Diffuse dilitation of CBD and CHD.     Duct was cannulated with a 0.84mm wire. A 12mm sphincterotomy was     done and duct was swept with a 12mm balloon stone extractor     multiple times (see image1 and image3). A 10mm stone was delivered     into the duodenum  The pancreatic duct was filled to the tail and     appeared to be normal. Care was taken not to overfill the ductal     system. Pancreatic duct was cannulated. Because of difficulty     cannulating the CBD, a 5Fr 5cm pigtail pancreatic stent was     placed. Following this the CBD was successfully cannulated.    The     scope was then completely withdrawn from the patient and the     procedure terminated.     <<PROCEDUREIMAGES>>           COMPLICATIONS:  None           ENDOSCOPIC IMPRESSION:     1) Stone in the common bile duct - s/p sphincterotomy and stone     extraction  2) Normal pancreatic duct  - pancreatic stent in place           RECOMMENDATIONS:f/u abd x-ray in 5 days if pt doesn't     spontaneously pass pancreatic stent           ______________________________     Barbette Hair. Arlyce Dice, MD           cc Dr. Bertram Savin           n.     eSIGNED:   Barbette Hair. Emalie Mcwethy at 01/27/2009 11:50 AM  [place], [place], [phone]  Note: An exclamation mark (!) indicates a result that was not dispersed into the flowsheet. Document Creation Date: 01/27/2009 11:51 AM _______________________________________________________________________  (1) Order result status: Final Collection or observation date-time: 01/27/2009 11:39 Requested date-time:  Receipt date-time:  Reported date-time:  Referring Physician:   Ordering Physician: Melvia Heaps [phone]) Specimen Source:  Source: Launa Grill Order Number: [id number] Lab site:

## 2010-02-12 NOTE — Letter (Signed)
[facility]   Imported By: Maryln Gottron 01/29/2009 15:18:02  _____________________________________________________________________  External Attachment:    Type:   Image     Comment:   External Document

## 2010-02-12 NOTE — Letter (Signed)
Summary: [facility] Instructions  [facility]  [address] [place], [place] [address]   Phone: [phone]  Fax: [phone]       TRENTIN KNAPPENBERGER    Nov 14, 1963    MRN: [id number]        Procedure Day /Date: 02-24-2010     Arrival Time: 2:00 PM      Procedure Time: 3:00 PM     Location of Procedure:                    X      Endoscopy Center (4th Floor)                         PREPARATION FOR COLONOSCOPY WITH MOVIPREP   Starting 5 days prior to your procedure 02-19-2010 do not eat nuts, seeds, popcorn, corn, beans, peas,  salads, or any raw vegetables.  Do not take any fiber supplements (e.g. Metamucil, Citrucel, and Benefiber).  THE DAY BEFORE YOUR PROCEDURE         DATE: 02-23-2010  DAY: Monday  1.  Drink clear liquids the entire day-NO SOLID FOOD  2.  Do not drink anything colored red or purple.  Avoid juices with pulp.  No orange juice.  3.  Drink at least 64 oz. (8 glasses) of fluid/clear liquids during the day to prevent dehydration and help the prep work efficiently.  CLEAR LIQUIDS INCLUDE: Water Jello Ice Popsicles Tea (sugar ok, no milk/cream) Powdered fruit flavored drinks Coffee (sugar ok, no milk/cream) Gatorade Juice: apple, white grape, white cranberry  Lemonade Clear bullion, consomm, broth Carbonated beverages (any kind) Strained chicken noodle soup Hard Candy                             4.  In the morning, mix first dose of MoviPrep solution:    Empty 1 Pouch A and 1 Pouch B into the disposable container    Add lukewarm drinking water to the top line of the container. Mix to dissolve    Refrigerate (mixed solution should be used within 24 hrs)  5.  Begin drinking the prep at 5:00 p.m. The MoviPrep container is divided by 4 marks.   Every 15 minutes drink the solution down to the next mark (approximately 8 oz) until the full liter is complete.   6.  Follow completed prep with 16 oz of clear liquid of your choice (Nothing red  or purple).  Continue to drink clear liquids until bedtime.  7.  Before going to bed, mix second dose of MoviPrep solution:    Empty 1 Pouch A and 1 Pouch B into the disposable container    Add lukewarm drinking water to the top line of the container. Mix to dissolve    Refrigerate  THE DAY OF YOUR PROCEDURE      DATE: 02-24-2010  DAY: Tuesday  Beginning at 10:00 AM  (5 hours before procedure):         1. Every 15 minutes, drink the solution down to the next mark (approx 8 oz) until the full liter is complete.  2. Follow completed prep with 16 oz. of clear liquid of your choice.    3. You may drink clear liquids until 1:00 PM  (2 HOURS BEFORE PROCEDURE).   MEDICATION INSTRUCTIONS  Unless otherwise instructed, you should take regular prescription medications with a small sip of water   as  early as possible the morning of your procedure.       OTHER INSTRUCTIONS  You will need a responsible adult at least 62 years of age to accompany you and drive you home.   This person must remain in the waiting room during your procedure.  Wear loose fitting clothing that is easily removed.  Leave jewelry and other valuables at home.  However, you may wish to bring a book to read or  an iPod/MP3 player to listen to music as you wait for your procedure to start.  Remove all body piercing jewelry and leave at home.  Total time from sign-in until discharge is approximately 2-3 hours.  You should go home directly after your procedure and rest.  You can resume normal activities the  day after your procedure.  The day of your procedure you should not:   Drive   Make legal decisions   Operate machinery   Drink alcohol   Return to work  You will receive specific instructions about eating, activities and medications before you leave.    The above instructions have been reviewed and explained to me by   _______________________    I fully understand and can verbalize these  instructions _____________________________ Date _________

## 2010-02-12 NOTE — Assessment & Plan Note (Signed)
Summary: fup from last week//ccm   Vital Signs:  Patient profile:   60 year old male Weight:      196 pounds O2 Sat:      93 % Temp:     98.7 degrees F Pulse rate:   87 / minute BP sitting:   122 / 84  (left arm) Cuff size:   regular  Vitals Entered By: Pura Spice, RN (January 27, 2010 11:08 AM) CC: folllow up tests  c/o still having discomfort feeling chest    History of Present Illness: Here to folow up on chest pains since we saw him 2 weeks ago. At that time he had a chest CT which was negative for any PEs, and he had a normal cardiac stress test as well. The CT showed 3 tiny nodules, one of which may be a bit larger than before. We felt this could have been due to GERD, so we asked him to stop taking Celebrex. He feels a little better now, but he is still getting sharp intermittent chest pains throughout the day. No sweats or nausea or SOB. Eating and drinking normally.   Allergies: 1)  ! Augmentin 2)  ! Flexeril 3)  ! Morphine 4)  ! Prednisone 5)  ! Amitriptyline Hcl 6)  ! * Robinul  Past History:  Past Medical History: Reviewed history from 01/14/2010 and no changes required. GERD, sees Dr. Wendall Papa cervival disc disease Headache Nephrolithiasis, hx of, sees Dr. Vonita Moss herpes simplex Hyperlipidemia insomnia DVT, hx of, found on 03-01-07 (was on Coumadin for 2 years, off in 02-2009)  Pulmonary embolism, hx of, sees Dr. Myna Hidalgo torn medial meniscus left knee, sees Dr. Ranell Patrick Peripheral neuropathy low testosterone   Past Surgical History: Colonoscopy 2007 per Dr. Ewing Schlein, repeat in 5 yrs cervical disc surgeries, 1996 and 1998 per Dr. Jeral Fruit EGD and dilatation of esophageal strictures per Dr. Merideth Abbey Study, normal Appendectomy Tonsillectomy lithotripsy for left renal stone with stent placement 09-26-08 per Dr. Earlene Plater, stent removed on 10-03-08  laparoscopic cholecystectomy 11-13-08 per Dr. Bertram Savin ERCP with stone extraction from the common bile  duct 01-27-09 per Dr. Wendall Papa bilateral 3rd finger trigger releases 2011 per Dr. Ranell Patrick  Review of Systems  The patient denies anorexia, fever, weight loss, weight gain, vision loss, decreased hearing, hoarseness, syncope, dyspnea on exertion, peripheral edema, prolonged cough, headaches, hemoptysis, abdominal pain, melena, hematochezia, severe indigestion/heartburn, hematuria, incontinence, genital sores, muscle weakness, suspicious skin lesions, transient blindness, difficulty walking, depression, unusual weight change, abnormal bleeding, enlarged lymph nodes, angioedema, breast masses, and testicular masses.    Physical Exam  General:  Well-developed,well-nourished,in no acute distress; alert,appropriate and cooperative throughout examination Neck:  No deformities, masses, or tenderness noted. Chest Wall:  No deformities, masses, tenderness or gynecomastia noted. Lungs:  Normal respiratory effort, chest expands symmetrically. Lungs are clear to auscultation, no crackles or wheezes. Heart:  Normal rate and regular rhythm. S1 and S2 normal without gallop, murmur, click, rub or other extra sounds.   Impression & Recommendations:  Problem # 1:  CHEST PAIN (ICD-786.50)  Problem # 2:  PULMONARY NODULE (ICD-518.89)  Complete Medication List: 1)  Tricor 145 Mg Tabs (Fenofibrate) .Marland Kitchen.. 1 by mouth once daily 2)  Aciphex 20 Mg Tbec (Rabeprazole sodium) .... Two times a day 3)  Zetia 10 Mg Tabs (Ezetimibe) .Marland Kitchen.. 1 by mouth once daily 4)  Relpax 20 Mg Tabs (Eletriptan hydrobromide) .... As needed 5)  Ambien Cr 12.5 Mg Tbcr (Zolpidem tartrate) .... At  bedtime prn 6)  Valtrex 500 Mg Tabs (Valacyclovir hcl) .Marland Kitchen.. 1 by mouth once daily 7)  Multivitamins Tabs (Multiple vitamin) .Marland Kitchen.. 1 by mouth once daily 8)  Folic Acid 1 Mg Tabs (Folic acid) .... Once daily 9)  Nulev 0.125 Mg Tbdp (Hyoscyamine sulfate) .... One as needed 10)  Zofran Odt 4 Mg Tbdp (Ondansetron) .Marland Kitchen.. 1-2 q 3 hours as needed nausea 11)   Celebrex 200 Mg Caps (Celecoxib) .... Once daily  Patient Instructions: 1)  It still seems likely that the source of these pains is esophageal. I asked him to get an appt. with Dr. Christella Hartigan soon, and that he probably needs another upper endoscopy. We will plan on a follow up chest CT in July.    Orders Added: 1)  Est. Patient Level IV [[address]109]

## 2010-02-12 NOTE — Progress Notes (Signed)
Summary: REQ FOR REFILL  Phone Note Call from Patient   Caller: Patient [phone] Reason for Call: Refill Medication Summary of Call: PT CALLED TO ADV HE NEEDS REFILL ON MAGIC MOUTHWASH [facility] [address] [place]---- PT C/O SORES IN MOUTH.  Initial call taken by: Debbra Riding,  March 21, 2009 4:21 PM  Follow-up for Phone Call        swish one tsp in mouth q 4 hours as needed , call in 300 ml with 2 rf Follow-up by: Nelwyn Salisbury MD,  March 21, 2009 4:51 PM  Additional Follow-up for Phone Call Additional follow up Details #1::        Phone Call Completed, Rx Called In Additional Follow-up by: Alfred Levins, CMA,  March 21, 2009 4:59 PM

## 2010-02-12 NOTE — Assessment & Plan Note (Signed)
Summary: CHILLS, FEVER, N/V, H/A // RS   Vital Signs:  Patient profile:   60 year old male Weight:      198 pounds O2 Sat:      97 % Temp:     98.5 degrees F Pulse rate:   86 / minute BP sitting:   104 / 72  (left arm)  Vitals Entered By: Pura Spice, RN (October 23, 2009 11:08 AM) CC: vomiting diarrhea since last hs.    History of Present Illness: Here with his wife for the onset last night of nausea and vomitting, diarhhea, HA, and body aches. No URI symptoms. Unable to keep much fluids down. No fever.   Allergies: 1)  ! Augmentin 2)  ! Flexeril 3)  ! Morphine 4)  ! Prednisone 5)  ! Amitriptyline Hcl 6)  ! * Robinul  Past History:  Past Medical History: Reviewed history from 08/04/2009 and no changes required. GERD, sees Dr. Wendall Papa cervival disc disease Headache Nephrolithiasis, hx of, sees Dr. Vonita Moss herpes simplex Hyperlipidemia insomnia DVT, hx of, found on 03-01-07 (to be anticoagulated for at least 2 years) Pulmonary embolism, hx of, sees Dr. Myna Hidalgo torn medial meniscus left knee, sees Dr. Ranell Patrick Peripheral neuropathy low testosterone   Past Surgical History: Reviewed history from 08/04/2009 and no changes required. Colonoscopy 2007 per Dr. Ewing Schlein, repeat in 5 yrs cervical disc surgeries, 1996 and 1998 per Dr. Jeral Fruit EGD and dilatation of esophageal strictures Cardiolite Study, normal Appendectomy Tonsillectomy lithotripsy for left renal stone with stent placement 09-26-08 per Dr. Earlene Plater, stent removed on 10-03-08  laparoscopic cholecystectomy 11-13-08 per Dr. Bertram Savin ERCP with stone extraction from the common bile duct 01-27-09 per Dr. Wendall Papa bilateral 3rd finger trigger releases 2011 per Dr. Ranell Patrick  Review of Systems  The patient denies anorexia, fever, weight loss, weight gain, vision loss, decreased hearing, hoarseness, chest pain, syncope, dyspnea on exertion, peripheral edema, prolonged cough, headaches, hemoptysis, melena,  hematochezia, severe indigestion/heartburn, hematuria, incontinence, genital sores, muscle weakness, suspicious skin lesions, transient blindness, difficulty walking, depression, unusual weight change, abnormal bleeding, enlarged lymph nodes, angioedema, breast masses, and testicular masses.    Physical Exam  General:  alert but appears ill Neck:  No deformities, masses, or tenderness noted. Lungs:  Normal respiratory effort, chest expands symmetrically. Lungs are clear to auscultation, no crackles or wheezes. Heart:  Normal rate and regular rhythm. S1 and S2 normal without gallop, murmur, click, rub or other extra sounds. Abdomen:  soft, normal bowel sounds, no distention, no masses, no guarding, no rigidity, no rebound tenderness, no abdominal hernia, no inguinal hernia, no hepatomegaly, and no splenomegaly.  Mildly tender in both lower quadrants.    Impression & Recommendations:  Problem # 1:  GASTROENTERITIS, VIRAL (ICD-008.8)  Orders: Promethazine up to 50mg  (J2550) Admin of Therapeutic Inj  intramuscular or subcutaneous ([address]109)  Complete Medication List: 1)  Tricor 145 Mg Tabs (Fenofibrate) .Marland Kitchen.. 1 by mouth once daily 2)  Aciphex 20 Mg Tbec (Rabeprazole sodium) .... Two times a day 3)  Zetia 10 Mg Tabs (Ezetimibe) .Marland Kitchen.. 1 by mouth once daily 4)  Relpax 20 Mg Tabs (Eletriptan hydrobromide) .... As needed 5)  Ambien Cr 12.5 Mg Tbcr (Zolpidem tartrate) .... At bedtime prn 6)  Valtrex 500 Mg Tabs (Valacyclovir hcl) .Marland Kitchen.. 1 by mouth once daily 7)  Multivitamins Tabs (Multiple vitamin) .Marland Kitchen.. 1 by mouth once daily 8)  Folic Acid 1 Mg Tabs (Folic acid) .... Once daily 9)  Nulev 0.125 Mg Tbdp (Hyoscyamine  sulfate) .... One as needed 10)  Zofran Odt 4 Mg Tbdp (Ondansetron) .Marland Kitchen.. 1-2 q 3 hours as needed nausea  Patient Instructions: 1)  He has Vicodin at home he can use for pain. Use Imodium AD for diarrhea. Use Zofran ODT for nausea. Drink lots of fluids.  Prescriptions: ZOFRAN ODT 4 MG TBDP  (ONDANSETRON) 1-2 q 3 hours as needed nausea  #60 x 2   Entered and Authorized by:   Nelwyn Salisbury MD   Signed by:   Nelwyn Salisbury MD on 10/23/2009   Method used:   [facility]        [facility] [phone][facility] (retail)       [address] [place], [place]  [address]       Ph: [phone] or [phone]       Fax: [phone]   RxID:   [id number]      Medication Administration  Injection # 1:    Medication: Promethazine up to 50mg     Diagnosis: GASTROENTERITIS, VIRAL (ICD-008.8)    Route: IM    Site: RUOQ gluteus    Exp Date: 08/2011    Lot #: [id number]    Mfr: [email or web address]    Patient tolerated injection without complications    Given by: Pura Spice, RN (October 23, 2009 12:13 PM)  Orders Added: 1)  Est. Patient Level IV [51884] 2)  Promethazine up to 50mg  [J2550] 3)  Admin of Therapeutic Inj  intramuscular or subcutaneous [16606]

## 2010-02-12 NOTE — Progress Notes (Signed)
Summary: triage  Phone Note Call from Patient Call back at Home Phone [phone]   Caller: Spouse Call For: Dr Christella Hartigan Reason for Call: Talk to Nurse Summary of Call: Patient has non cardiac chest pains x two weeks, wants to be seen sooner than 2-15. Initial call taken by: Tawni Levy,  January 27, 2010 1:20 PM  Follow-up for Phone Call        Given appt. with Gunnar Fusi for 01/30/10 as Dr.Jacobs is supervising dr. Follow-up by: Teryl Lucy RN,  January 27, 2010 2:24 PM

## 2010-02-12 NOTE — Assessment & Plan Note (Signed)
Summary: CPX // RS   Vital Signs:  Patient profile:   60 year old male Weight:      203 pounds BMI:     29.23 BP sitting:   122 / 82  (left arm) Cuff size:   regular  Vitals Entered By: Raechel Ache, RN (August 04, 2009 9:11 AM) CC: CPX, labs done. C/o pain L side abdomen and chest and L leg still hurts where clot was. Needs refills.   History of Present Illness: 60 yr old male for a cpx. He is doing well in general. Active, no concerns.   Allergies: 1)  ! Augmentin 2)  ! Flexeril 3)  ! Morphine 4)  ! Prednisone 5)  ! Amitriptyline Hcl 6)  ! * Robinul  Past History:  Past Medical History: GERD, sees Dr. Wendall Papa cervival disc disease Headache Nephrolithiasis, hx of, sees Dr. Vonita Moss herpes simplex Hyperlipidemia insomnia DVT, hx of, found on 03-01-07 (to be anticoagulated for at least 2 years) Pulmonary embolism, hx of, sees Dr. Myna Hidalgo torn medial meniscus left knee, sees Dr. Ranell Patrick Peripheral neuropathy low testosterone   Past Surgical History: Colonoscopy 2007 per Dr. Ewing Schlein, repeat in 5 yrs cervical disc surgeries, 1996 and 1998 per Dr. Jeral Fruit EGD and dilatation of esophageal strictures Cardiolite Study, normal Appendectomy Tonsillectomy lithotripsy for left renal stone with stent placement 09-26-08 per Dr. Earlene Plater, stent removed on 10-03-08  laparoscopic cholecystectomy 11-13-08 per Dr. Bertram Savin ERCP with stone extraction from the common bile duct 01-27-09 per Dr. Wendall Papa bilateral 3rd finger trigger releases 2011 per Dr. Ranell Patrick  Family History: Reviewed history from 11/05/2008 and no changes required. Family History of CAD Male 1st degree relative <50 Family History of Colon CA 1st degree relative <60 Family History Hypertension Family History of Stroke M 1st degree relative <50  mother and son with DVTs and PEs  Social History: Reviewed history from 11/05/2008 and no changes required. Married Never Smoked Alcohol use-no   Review of  Systems  The patient denies anorexia, fever, weight loss, weight gain, vision loss, decreased hearing, hoarseness, chest pain, syncope, dyspnea on exertion, peripheral edema, prolonged cough, headaches, hemoptysis, abdominal pain, melena, hematochezia, severe indigestion/heartburn, hematuria, incontinence, genital sores, muscle weakness, suspicious skin lesions, transient blindness, difficulty walking, depression, unusual weight change, abnormal bleeding, enlarged lymph nodes, angioedema, breast masses, and testicular masses.    Physical Exam  General:  overweight-appearing.   Head:  Normocephalic and atraumatic without obvious abnormalities. No apparent alopecia or balding. Eyes:  No corneal or conjunctival inflammation noted. EOMI. Perrla. Funduscopic exam benign, without hemorrhages, exudates or papilledema. Vision grossly normal. Ears:  External ear exam shows no significant lesions or deformities.  Otoscopic examination reveals clear canals, tympanic membranes are intact bilaterally without bulging, retraction, inflammation or discharge. Hearing is grossly normal bilaterally. Nose:  External nasal examination shows no deformity or inflammation. Nasal mucosa are pink and moist without lesions or exudates. Mouth:  Oral mucosa and oropharynx without lesions or exudates.  Teeth in good repair. Neck:  No deformities, masses, or tenderness noted. Chest Wall:  No deformities, masses, tenderness or gynecomastia noted. Lungs:  Normal respiratory effort, chest expands symmetrically. Lungs are clear to auscultation, no crackles or wheezes. Heart:  Normal rate and regular rhythm. S1 and S2 normal without gallop, murmur, click, rub or other extra sounds. EKG normal Abdomen:  Bowel sounds positive,abdomen soft and non-tender without masses, organomegaly or hernias noted. Rectal:  No external abnormalities noted. Normal sphincter tone. No rectal masses or  tenderness. Heme neg Genitalia:  Testes bilaterally  descended without nodularity, tenderness or masses. No scrotal masses or lesions. No penis lesions or urethral discharge. Prostate:  Prostate gland firm and smooth, no enlargement, nodularity, tenderness, mass, asymmetry or induration. Msk:  No deformity or scoliosis noted of thoracic or lumbar spine.   Pulses:  R and L carotid,radial,femoral,dorsalis pedis and posterior tibial pulses are full and equal bilaterally Extremities:  No clubbing, cyanosis, edema, or deformity noted with normal full range of motion of all joints.   Neurologic:  No cranial nerve deficits noted. Station and gait are normal. Plantar reflexes are down-going bilaterally. DTRs are symmetrical throughout. Sensory, motor and coordinative functions appear intact. Skin:  Intact without suspicious lesions or rashes Cervical Nodes:  No lymphadenopathy noted Axillary Nodes:  No palpable lymphadenopathy Inguinal Nodes:  No significant adenopathy Psych:  Cognition and judgment appear intact. Alert and cooperative with normal attention span and concentration. No apparent delusions, illusions, hallucinations   Impression & Recommendations:  Problem # 1:  WELL ADULT EXAM (ICD-V70.0)  Orders: Hemoccult Guaiac-1 spec.(in office) (82270) EKG w/ Interpretation (93000)  Complete Medication List: 1)  Tricor 145 Mg Tabs (Fenofibrate) .Marland Kitchen.. 1 by mouth once daily 2)  Aciphex 20 Mg Tbec (Rabeprazole sodium) .... Two times a day 3)  Zetia 10 Mg Tabs (Ezetimibe) .Marland Kitchen.. 1 by mouth once daily 4)  Relpax 20 Mg Tabs (Eletriptan hydrobromide) .... As needed 5)  Ambien Cr 12.5 Mg Tbcr (Zolpidem tartrate) .... At bedtime prn 6)  Valtrex 500 Mg Tabs (Valacyclovir hcl) .Marland Kitchen.. 1 by mouth once daily 7)  Multivitamins Tabs (Multiple vitamin) .Marland Kitchen.. 1 by mouth once daily 8)  Folic Acid 1 Mg Tabs (Folic acid) .... Once daily 9)  Nulev 0.125 Mg Tbdp (Hyoscyamine sulfate) .... One as needed  Patient Instructions: 1)  Please schedule a follow-up appointment  in 6 months .  2)  It is important that you exercise reguarly at least 20 minutes 5 times a week. If you develop chest pain, have severe difficulty breathing, or feel very tired, stop exercising immediately and seek medical attention.  3)  You need to lose weight. Consider a lower calorie diet and regular exercise.  Prescriptions: FOLIC ACID 1 MG  TABS (FOLIC ACID) once daily  #90 x 3   Entered and Authorized by:   Nelwyn Salisbury MD   Signed by:   Nelwyn Salisbury MD on 08/04/2009   Method used:   Print then Give to Patient   RxID:   [id number] NULEV 0.125 MG TBDP (HYOSCYAMINE SULFATE) one as needed  #100 x 3   Entered and Authorized by:   Nelwyn Salisbury MD   Signed by:   Nelwyn Salisbury MD on 08/04/2009   Method used:   Print then Give to Patient   RxID:   [id number] VALTREX 500 MG TABS (VALACYCLOVIR HCL) 1 by mouth once daily Brand medically necessary #90 x 3   Entered and Authorized by:   Nelwyn Salisbury MD   Signed by:   Nelwyn Salisbury MD on 08/04/2009   Method used:   Print then Give to Patient   RxID:   [id number] AMBIEN CR 12.5 MG TBCR (ZOLPIDEM TARTRATE) at bedtime prn Brand medically necessary #90 x 1   Entered and Authorized by:   Nelwyn Salisbury MD   Signed by:   Nelwyn Salisbury MD on 08/04/2009   Method used:   Print then Give to Patient   RxID:  [id number] RELPAX 20 MG  TABS (ELETRIPTAN HYDROBROMIDE) as needed  #36 x 3   Entered and Authorized by:   Nelwyn Salisbury MD   Signed by:   Nelwyn Salisbury MD on 08/04/2009   Method used:   Print then Give to Patient   RxID:   [id number] ZETIA 10 MG  TABS (EZETIMIBE) 1 by mouth once daily  #90 x 3   Entered and Authorized by:   Nelwyn Salisbury MD   Signed by:   Nelwyn Salisbury MD on 08/04/2009   Method used:   Print then Give to Patient   RxID:   [id number] ACIPHEX 20 MG TBEC (RABEPRAZOLE SODIUM) two times a day  #180 x 3   Entered and Authorized by:   Nelwyn Salisbury MD   Signed by:   Nelwyn Salisbury MD on  08/04/2009   Method used:   Print then Give to Patient   RxID:   [id number] TRICOR 145 MG TABS (FENOFIBRATE) 1 by mouth once daily  #90 x 3   Entered and Authorized by:   Nelwyn Salisbury MD   Signed by:   Nelwyn Salisbury MD on 08/04/2009   Method used:   Print then Give to Patient   RxID:   [id number]

## 2010-02-12 NOTE — Assessment & Plan Note (Signed)
Summary: Cardiology Nuclear Testing  Nuclear Med Background Indications for Stress Test: Evaluation for Ischemia   History: CT/MRI, Heart Catheterization, Myocardial Perfusion Study  History Comments: '02 MPS-nml EF=58%; '04 Cath EF= 60% -nml coronary arteries; 01/14/10  CT/MRI neg. PE  Symptoms: Chest Pain, DOE, SOB, Vomiting  Symptoms Comments: Last episode of CP this morning.   Nuclear Pre-Procedure Cardiac Risk Factors: Family History - CAD, History of Smoking, Lipids Caffeine/Decaff Intake: None NPO After: 7:30 PM Lungs: Clear IV 0.9% NS with Angio Cath: 22g     IV Site: R Wrist IV Started by: Bonnita Levan, RN Chest Size (in) 44     Height (in): 70 Weight (lb): 200 BMI: 28.80  Nuclear Med Study 1 or 2 day study:  1 day     Stress Test Type:  Stress Reading MD:  Cassell Clement, MD     Referring MD:  Dr. Claris Che Resting Radionuclide:  Technetium 48m Tetrofosmin     Resting Radionuclide Dose:  11 mCi  Stress Radionuclide:  Technetium 21m Tetrofosmin     Stress Radionuclide Dose:  33 mCi   Stress Protocol Exercise Time (min):  10:15 min     Max HR:  142 bpm     Predicted Max HR:  159 bpm  Max Systolic BP: 168 mm Hg     Percent Max HR:  89.31 %     METS: 12.1 Rate Pressure Product:  60454    Stress Test Technologist:  Bonnita Levan, RN     Nuclear Technologist:  Doyne Keel, CNMT  Rest Procedure  Myocardial perfusion imaging was performed at rest 45 minutes following the intravenous administration of Technetium 64m Tetrofosmin.  Stress Procedure  The patient exercised for 10:15.  The patient stopped due to leg fatigue and dyspnea and denied any chest pain.  There were no significant ST-T wave changes.  Technetium 53m Tetrofosmin was injected at peak exercise and myocardial perfusion imaging was performed after a brief delay.  QPS Raw Data Images:  Normal; no motion artifact; normal heart/lung ratio. Stress Images:  Normal homogeneous uptake in all areas of the  myocardium. Rest Images:  Normal homogeneous uptake in all areas of the myocardium. Subtraction (SDS):  No evidence of ischemia. Transient Ischemic Dilatation:  0.96  (Normal <1.22)  Lung/Heart Ratio:  0.33  (Normal <0.45)  Quantitative Gated Spect Images QGS EDV:  75 ml QGS ESV:  22 ml QGS EF:  70 % QGS cine images:  No wall motion abnormalities.  Findings Normal nuclear study      Overall Impression  Exercise Capacity: Good exercise capacity. BP Response: Normal blood pressure response. Clinical Symptoms: No chest pain ECG Impression: No significant ST segment change suggestive of ischemia. Overall Impression: Normal stress nuclear study.  Appended Document: Cardiology Nuclear Testing normal stress test   Appended Document: Cardiology Nuclear Testing pt called

## 2010-02-12 NOTE — Progress Notes (Signed)
Summary: change Valtrex to generic  Phone Note From Pharmacy   Caller: [facility] Reason for Call: Needs renewal Details for Reason: valtrex 500mg  Summary of Call: They are wanting to change him to generic. Is this okay to do? Initial call taken by: Romualdo Bolk, CMA Duncan Dull),  January 20, 2010 3:25 PM  Follow-up for Phone Call        yes generic is fine  Follow-up by: Nelwyn Salisbury MD,  January 22, 2010 8:38 AM  Additional Follow-up for Phone Call Additional follow up Details #1::        [facility]  Additional Follow-up by: Pura Spice, RN,  January 22, 2010 8:55 AM

## 2010-02-12 NOTE — Letter (Signed)
Summary: [facility]  [facility]   Imported By: Maryln Gottron 11/04/2009 13:08:53  _____________________________________________________________________  External Attachment:    Type:   Image     Comment:   External Document

## 2010-02-12 NOTE — Assessment & Plan Note (Signed)
Summary: r/o PE/dm   Vital Signs:  Patient profile:   60 year old male Weight:      202 pounds O2 Sat:      93 % Temp:     98.4 degrees F Pulse rate:   70 / minute BP sitting:   140 / 100  (left arm) Cuff size:   regular  Vitals Entered By: Pura Spice, RN (January 14, 2010 10:42 AM) CC: ant chest pain radiating to back  states having alot SOB and stated has had hx of PE     History of Present Illness: Here for chest pains and SOB. He has noticed some intermittent mild chest pains for several months, and these are not related to exertion. He has had some mild SOB at times as well. He was started on Celebrex about 3 months ago, and this irritated his stomach, so he increased his Aciphex to two times a day dosing. Last night shortly after eating popcorn and drinking a soda he developed more intense chest pain which radiated through to the back. This was associated with some mild SOB. No nausea or sweats. These lasted through the night and then eased off this am. Now he feels fine except for some mild fatigue. He has not eaten anything this am.   Allergies: 1)  ! Augmentin 2)  ! Flexeril 3)  ! Morphine 4)  ! Prednisone 5)  ! Amitriptyline Hcl 6)  ! * Robinul  Past History:  Past Medical History: GERD, sees Dr. Wendall Papa cervival disc disease Headache Nephrolithiasis, hx of, sees Dr. Vonita Moss herpes simplex Hyperlipidemia insomnia DVT, hx of, found on 03-01-07 (was on Coumadin for 2 years, off in 02-2009)  Pulmonary embolism, hx of, sees Dr. Myna Hidalgo torn medial meniscus left knee, sees Dr. Ranell Patrick Peripheral neuropathy low testosterone   Past Surgical History: Reviewed history from 08/04/2009 and no changes required. Colonoscopy 2007 per Dr. Ewing Schlein, repeat in 5 yrs cervical disc surgeries, 1996 and 1998 per Dr. Jeral Fruit EGD and dilatation of esophageal strictures Cardiolite Study, normal Appendectomy Tonsillectomy lithotripsy for left renal stone with stent placement  09-26-08 per Dr. Earlene Plater, stent removed on 10-03-08  laparoscopic cholecystectomy 11-13-08 per Dr. Bertram Savin ERCP with stone extraction from the common bile duct 01-27-09 per Dr. Wendall Papa bilateral 3rd finger trigger releases 2011 per Dr. Ranell Patrick  Review of Systems  The patient denies anorexia, fever, weight loss, weight gain, vision loss, decreased hearing, hoarseness, syncope, peripheral edema, prolonged cough, headaches, hemoptysis, abdominal pain, melena, hematochezia, severe indigestion/heartburn, hematuria, incontinence, genital sores, muscle weakness, suspicious skin lesions, transient blindness, difficulty walking, depression, unusual weight change, abnormal bleeding, enlarged lymph nodes, angioedema, breast masses, and testicular masses.    Physical Exam  General:  Well-developed,well-nourished,in no acute distress; alert,appropriate and cooperative throughout examination Neck:  No deformities, masses, or tenderness noted. Chest Wall:  No deformities, masses, tenderness or gynecomastia noted. Lungs:  Normal respiratory effort, chest expands symmetrically. Lungs are clear to auscultation, no crackles or wheezes. Heart:  Normal rate and regular rhythm. S1 and S2 normal without gallop, murmur, click, rub or other extra sounds. EKG normal  Abdomen:  Bowel sounds positive,abdomen soft and non-tender without masses, organomegaly or hernias noted.   Impression & Recommendations:  Problem # 1:  CHEST PAIN (ICD-786.50)  Orders: UA Dipstick w/o Micro (automated)  (81003) EKG w/ Interpretation (93000) Venipuncture (16109) TLB-BMP (Basic Metabolic Panel-BMET) (80048-METABOL) TLB-CBC Platelet - w/Differential (85025-CBCD) TLB-Hepatic/Liver Function Pnl (80076-HEPATIC) TLB-TSH (Thyroid Stimulating Hormone) (84443-TSH)  Complete Medication List: 1)  Tricor 145 Mg Tabs (Fenofibrate) .Marland Kitchen.. 1 by mouth once daily 2)  Aciphex 20 Mg Tbec (Rabeprazole sodium) .... Two times a day 3)  Zetia 10 Mg  Tabs (Ezetimibe) .Marland Kitchen.. 1 by mouth once daily 4)  Relpax 20 Mg Tabs (Eletriptan hydrobromide) .... As needed 5)  Ambien Cr 12.5 Mg Tbcr (Zolpidem tartrate) .... At bedtime prn 6)  Valtrex 500 Mg Tabs (Valacyclovir hcl) .Marland Kitchen.. 1 by mouth once daily 7)  Multivitamins Tabs (Multiple vitamin) .Marland Kitchen.. 1 by mouth once daily 8)  Folic Acid 1 Mg Tabs (Folic acid) .... Once daily 9)  Nulev 0.125 Mg Tbdp (Hyoscyamine sulfate) .... One as needed 10)  Zofran Odt 4 Mg Tbdp (Ondansetron) .Marland Kitchen.. 1-2 q 3 hours as needed nausea 11)  Celebrex 200 Mg Caps (Celecoxib) .... Once daily  Patient Instructions: 1)  I think his chest symptoms are probably esophageal in nature, and the Celebrex is probably contributing to this. Stop Celebrex and stay on Aciphex two times a day . Get labs and a contrasted chest CT today to rule out a PE. After this, we will set up a cardiac ctress test in the near future.    Orders Added: 1)  Est. Patient Level V [99215] 2)  UA Dipstick w/o Micro (automated)  [81003] 3)  EKG w/ Interpretation [93000] 4)  Venipuncture [36415] 5)  TLB-BMP (Basic Metabolic Panel-BMET) [80048-METABOL] 6)  TLB-CBC Platelet - w/Differential [85025-CBCD] 7)  TLB-Hepatic/Liver Function Pnl [80076-HEPATIC] 8)  TLB-TSH (Thyroid Stimulating Hormone) [16109-UEA]  Appended Document: referral Chest CT     Clinical Lists Changes  Problems: Added new problem of SHORTNESS OF BREATH (ICD-786.05) Orders: Added new Referral order of Radiology Referral (Radiology) - Signed      Appended Document: r/o PE/dm  Laboratory Results   Urine Tests    Routine Urinalysis   Color: yellow Appearance: Clear Glucose: negative   (Normal Range: Negative) Bilirubin: negative   (Normal Range: Negative) Ketone: negative   (Normal Range: Negative) Spec. Gravity: 1.020   (Normal Range: 1.003-1.035) Blood: negative   (Normal Range: Negative) pH: 7.0   (Normal Range: 5.0-8.0) Protein: negative   (Normal Range:  Negative) Urobilinogen: 0.2   (Normal Range: 0-1) Nitrite: negative   (Normal Range: Negative) Leukocyte Esterace: negative   (Normal Range: Negative)    Comments: Rita Ohara  January 14, 2010 11:24 AM

## 2010-02-12 NOTE — Assessment & Plan Note (Addendum)
Summary: non cardiac chest pain-Rfe. by Dr.Fry   History of Present Illness Visit Type: Follow-up Visit Primary GI MD: Rob Bunting MD Primary Provider: Gershon Crane, MD  Requesting Provider: Gershon Crane, MD  Chief Complaint: chest pain non-cardiac x 1 month History of Present Illness:   Patient was seen a year ago by Dr. Christella Hartigan for abdominal pain. Turns out that he had gallbladder disease and underwent cholecystectomy. A couple of months later he was hospitalized with CBD stones and had ERCP with stone extraction. Patient hasn't been seen here since.  Patient comes in today for evaluation of chest pain for which he saw Dr. Abran Cantor 01/14/10. A CTA of chest was done for chest pain, SOB and history of PE, it was negative.  Patient had a nuclear stress test, also normal. Initally the chest pain radiated through to his back, now just achy chest pain radiating over to right chest. Pain is intermittent, he can not relate it to eating. Still has some SOB but mainly during exertion. Patient has been on Aciphex twice daily for years. He was formerly followed by Dr. Ewing Schlein who has stretched his esophagus and given him Botox injections. Patient hasn't seen Dr. Ewing Schlein in years.  Patient has chronic intermittent, eft-sided abdominal pain  Patient also states it is nearing time for his colonoscopy.    GI Review of Systems    Reports abdominal pain, acid reflux, bloating, and  chest pain.     Location of  Abdominal pain: LUQ.    Denies belching, dysphagia with liquids, dysphagia with solids, heartburn, loss of appetite, nausea, vomiting, vomiting blood, weight loss, and  weight gain.        Denies anal fissure, black tarry stools, change in bowel habit, constipation, diarrhea, diverticulosis, fecal incontinence, heme positive stool, hemorrhoids, irritable bowel syndrome, jaundice, light color stool, liver problems, rectal bleeding, and  rectal pain.    Current Medications (verified): 1)  Tricor 145 Mg Tabs  (Fenofibrate) .Marland Kitchen.. 1 By Mouth Once Daily 2)  Aciphex 20 Mg Tbec (Rabeprazole Sodium) .... Two Times A Day 3)  Zetia 10 Mg  Tabs (Ezetimibe) .Marland Kitchen.. 1 By Mouth Once Daily 4)  Relpax 20 Mg  Tabs (Eletriptan Hydrobromide) .... As Needed 5)  Ambien Cr 12.5 Mg Tbcr (Zolpidem Tartrate) .... At Bedtime Prn 6)  Valtrex 500 Mg Tabs (Valacyclovir Hcl) .Marland Kitchen.. 1 By Mouth Once Daily 7)  Multivitamins   Tabs (Multiple Vitamin) .Marland Kitchen.. 1 By Mouth Once Daily 8)  Folic Acid 1 Mg  Tabs (Folic Acid) .... Once Daily 9)  Nulev 0.125 Mg Tbdp (Hyoscyamine Sulfate) .... One As Needed 10)  Zofran Odt 4 Mg Tbdp (Ondansetron) .Marland Kitchen.. 1-2 Q 3 Hours As Needed Nausea  Allergies (verified): 1)  ! Augmentin 2)  ! Flexeril 3)  ! Morphine 4)  ! Prednisone 5)  ! Amitriptyline Hcl 6)  ! * Robinul  Past History:  Past Medical History: Reviewed history from 01/14/2010 and no changes required. GERD, sees Dr. Wendall Papa cervival disc disease Headache Nephrolithiasis, hx of, sees Dr. Vonita Moss herpes simplex Hyperlipidemia insomnia DVT, hx of, found on 03-01-07 (was on Coumadin for 2 years, off in 02-2009)  Pulmonary embolism, hx of, sees Dr. Myna Hidalgo torn medial meniscus left knee, sees Dr. Ranell Patrick Peripheral neuropathy low testosterone   Past Surgical History: Reviewed history from 01/27/2010 and no changes required. Colonoscopy 2007 per Dr. Ewing Schlein, repeat in 5 yrs cervical disc surgeries, 1996 and 1998 per Dr. Jeral Fruit EGD and dilatation of esophageal strictures per Dr.  Magod  Cardiolite Study, normal Appendectomy Tonsillectomy lithotripsy for left renal stone with stent placement 09-26-08 per Dr. Earlene Plater, stent removed on 10-03-08  laparoscopic cholecystectomy 11-13-08 per Dr. Bertram Savin ERCP with stone extraction from the common bile duct 01-27-09 per Dr. Wendall Papa bilateral 3rd finger trigger releases 2011 per Dr. Ranell Patrick  Family History: Reviewed history from 08/04/2009 and no changes required. Family History of CAD Male  1st degree relative <50 Family History of Colon CA 1st degree relative <60 Family History Hypertension Family History of Stroke M 1st degree relative <50  mother and son with DVTs and PEs  Social History: Married Never Smoked Alcohol use-no  Daily Caffeine Use  Review of Systems       The patient complains of muscle pains/cramps, sleeping problems, and swelling of feet/legs.  The patient denies allergy/sinus, anemia, anxiety-new, arthritis/joint pain, back pain, blood in urine, breast changes/lumps, change in vision, confusion, cough, coughing up blood, depression-new, fainting, fatigue, fever, headaches-new, hearing problems, heart murmur, heart rhythm changes, itching, menstrual pain, night sweats, nosebleeds, pregnancy symptoms, shortness of breath, skin rash, sore throat, swollen lymph glands, thirst - excessive , urination - excessive , urination changes/pain, urine leakage, vision changes, and voice change.    Vital Signs:  Patient profile:   60 year old male Height:      70 inches Weight:      202.25 pounds Pulse rate:   72 / minute Pulse rhythm:   regular BP sitting:   120 / 78  (left arm) Cuff size:   regular  Vitals Entered By: June McMurray CMA Duncan Dull) (January 30, 2010 8:16 AM)  Physical Exam  General:  Well developed, well nourished, no acute distress. Head:  Normocephalic and atraumatic. Eyes:  Conjunctiva pink, no icterus.  Neck:  no obvious masses  Lungs:  Clear throughout to auscultation. Heart:  Regular rate and rhythm; no murmurs, rubs,  or bruits. Abdomen:  Abdomen soft, nontender, nondistended. No obvious masses or hepatomegaly.Normal bowel sounds.  Msk:  Symmetrical with no gross deformities. Normal posture. Extremities:  Abdomen soft, nontender, nondistended. No obvious masses or hepatomegaly.Normal bowel sounds.  Neurologic:  Alert and  oriented x4;  grossly normal neurologically. Skin:  Intact without significant lesions or rashes. Cervical Nodes:   No significant cervical adenopathy. Psych:  Alert and cooperative. Normal mood and affect.   Impression & Recommendations:  Problem # 1:  CHEST PAIN UNSPECIFIED (ICD-786.50) Assessment Deteriorated Chest pain with negative CTA of chest and negative nuclear stress test this month. I requested and reviewed some old reports from  Dr. Ewing Schlein, unfortunately I didn't have all of the information at the time I saw the patient. Patient has a history of chest discomfort dating back to at least 2003. He had an EGD in 2003 with findings of a widely patent esophageal ring as well as a  increased distal esophageal spasm. In 2004 he had another EGD with findings of linear antritis, small hiatal hernia and distal esophageal spasm which looked like achalasia. In 2007 an EGD revealed spams just above lower esophageal sphincter. Prior to obtaining these records I had arrange for EGD but perhaps we should first treat the patient for esophageal spasms.   Orders: Colon/Endo (Colon/Endo)  Problem # 2:  DYSPHAGIA (ZOX-096.04) Assessment: Comment Only Occasional solid food dysphagia which may be secondary to an esophageal ring or from esophageal dysmotility.  Orders: Colon/Endo (Colon/Endo)  Problem # 3:  FAMILY HISTORY OF COLON CA 1ST DEGREE RELATIVE <60 (ICD-V16.0) Assessment: Comment Only  Problem # 4:  PERSONAL HX COLONIC POLYPS (ICD-V12.72) Assessment: Comment Only Adenomatous polyp removed June 2007. Will arrange for surveillance colonoscopy with Dr. Christella Hartigan.  Problem # 5:  ABDOMINAL PAIN-LUQ (ICD-789.02) Assessment: Unchanged Chronic  (years) left sided abdominal pain with unrevealiing CTscans, colonoscopies, upper endoscopies, UGI series. Pain only bothers him "sometimes".   Patient Instructions: 1)  The Endoscopy/Colonoscopy is scheduled with Dr. Christella Hartigan on 02-24-2010/ 2)  Directions and brochure provided. 3)  [facility] Patient Information Guide given to patient. 4)  We sent  perscriptions for the colonoscopy prep and Carafate to your pharmacy, [facility] [address], [place]. 5)  Take Prilosec before meals. 6)  Copy sent to : Gershon Crane, MD 7)  The medication list was reviewed and reconciled.  All changed / newly prescribed medications were explained.  A complete medication list was provided to the patient / caregiver. Prescriptions: CARAFATE 1 GM TABS (SUCRALFATE) Take 1 tab before meals and bedtime  #120 x 0   Entered by:   Lowry Ram NCMA   Authorized by:   Willette Cluster NP   Signed by:   Lowry Ram NCMA on 01/30/2010   Method used:   [facility] to        [facility] [phone]* (retail)       [address] [place], [place]  [address]       Ph: [phone] or [phone]       Fax: [phone]   RxID:   [id number] MOVIPREP 100 GM  SOLR (PEG-KCL-NACL-NASULF-NA ASC-C) As per prep instructions.  #1 x 0   Entered by:   Lowry Ram NCMA   Authorized by:   Willette Cluster NP   Signed by:   Lowry Ram NCMA on 01/30/2010   Method used:   [facility]ronically to        [facility] [phone]* (retail)       [address] [place], [place]  [address]       Ph: [phone] or [phone]       Fax: [phone]   RxID:   [id number]   Appended Document: non cardiac chest pain-Rfe. by Dr.Fry Spoke with Dr. Christella Hartigan, we will proceed with EGD as planned.

## 2010-02-23 ENCOUNTER — Telehealth: Payer: Self-pay | Admitting: Nurse Practitioner

## 2010-02-24 ENCOUNTER — Encounter (AMBULATORY_SURGERY_CENTER): Payer: 59 | Admitting: Gastroenterology

## 2010-02-24 ENCOUNTER — Other Ambulatory Visit: Payer: Self-pay | Admitting: Gastroenterology

## 2010-02-24 DIAGNOSIS — R1012 Left upper quadrant pain: Secondary | ICD-10-CM

## 2010-02-24 DIAGNOSIS — K449 Diaphragmatic hernia without obstruction or gangrene: Secondary | ICD-10-CM

## 2010-02-24 DIAGNOSIS — D126 Benign neoplasm of colon, unspecified: Secondary | ICD-10-CM

## 2010-02-24 DIAGNOSIS — Z86010 Personal history of colonic polyps: Secondary | ICD-10-CM

## 2010-02-24 DIAGNOSIS — R079 Chest pain, unspecified: Secondary | ICD-10-CM

## 2010-02-24 DIAGNOSIS — K5730 Diverticulosis of large intestine without perforation or abscess without bleeding: Secondary | ICD-10-CM

## 2010-02-24 HISTORY — PX: ESOPHAGOGASTRODUODENOSCOPY: SHX1529

## 2010-02-24 LAB — HM COLONOSCOPY

## 2010-02-26 NOTE — Op Note (Signed)
Summary: EGD/[place]  EGD/[place]   Imported By: Sherian Rein 02/17/2010 07:18:25  _____________________________________________________________________  External Attachment:    Type:   Image     Comment:   External Document

## 2010-02-26 NOTE — Letter (Signed)
Summary: [facility]  [facility]   Imported By: Sherian Rein 02/17/2010 07:22:03  _____________________________________________________________________  External Attachment:    Type:   Image     Comment:   External Document

## 2010-02-26 NOTE — Procedures (Signed)
Summary: Colonoscopy/[place]  Colonoscopy/[place]   Imported By: Sherian Rein 02/17/2010 07:20:25  _____________________________________________________________________  External Attachment:    Type:   Image     Comment:   External Document

## 2010-02-26 NOTE — Procedures (Signed)
Summary: Upper GI Endoscopy/[facility]  Upper GI Endoscopy/[facility]   Imported By: Sherian Rein 02/17/2010 07:15:47  _____________________________________________________________________  External Attachment:    Type:   Image     Comment:   External Document

## 2010-02-26 NOTE — Procedures (Signed)
Summary: Colonoscopy/[facility]  Colonoscopy/[facility]   Imported By: Sherian Rein 02/17/2010 07:16:43  _____________________________________________________________________  External Attachment:    Type:   Image     Comment:   External Document

## 2010-02-26 NOTE — Letter (Signed)
Summary: [facility]  [facility]   Imported By: Sherian Rein 02/17/2010 07:19:18  _____________________________________________________________________  External Attachment:    Type:   Image     Comment:   External Document

## 2010-02-26 NOTE — Letter (Signed)
Summary: [facility]  [facility]   Imported By: Sherian Rein 02/17/2010 07:22:56  _____________________________________________________________________  External Attachment:    Type:   Image     Comment:   External Document

## 2010-02-26 NOTE — Procedures (Signed)
Summary: EGD/[place]  EGD/[place]   Imported By: Sherian Rein 02/17/2010 07:17:29  _____________________________________________________________________  External Attachment:    Type:   Image     Comment:   External Document

## 2010-03-03 ENCOUNTER — Ambulatory Visit (INDEPENDENT_AMBULATORY_CARE_PROVIDER_SITE_OTHER): Payer: 59 | Admitting: Family Medicine

## 2010-03-03 ENCOUNTER — Encounter: Payer: Self-pay | Admitting: Family Medicine

## 2010-03-03 ENCOUNTER — Encounter: Payer: Self-pay | Admitting: Gastroenterology

## 2010-03-03 VITALS — BP 122/82 | Temp 99.3°F | Ht 70.0 in | Wt 196.0 lb

## 2010-03-03 DIAGNOSIS — R109 Unspecified abdominal pain: Secondary | ICD-10-CM

## 2010-03-03 DIAGNOSIS — R1032 Left lower quadrant pain: Secondary | ICD-10-CM

## 2010-03-03 MED ORDER — METRONIDAZOLE 500 MG PO TABS: 500.0000 mg | ORAL_TABLET | Freq: Three times a day (TID) | ORAL | Status: DC

## 2010-03-03 MED ORDER — CIPROFLOXACIN HCL 500 MG PO TABS: 500.0000 mg | ORAL_TABLET | Freq: Two times a day (BID) | ORAL | Status: DC

## 2010-03-03 NOTE — Progress Notes (Signed)
  Subjective:    Patient ID: Steven [name], male    DOB: 09-08-1950, 60 y.o.   MRN: [id number]  HPI  Patient is seen with onset yesterday of bilateral lower abdominal pain. Similar episode many years ago attributed to possible diverticulitis. He also relates some intermittent headaches and sinus pressure over past week improved with Advil. No midepigastric pain. Pain is cramp like sensation somewhat intermittent. Low-grade fever around 100. Pain 8/10 in severity at worst. No associated vomiting or diarrhea. No bloody stools. Has some chronic occasional loose stools since cholecystectomy several years ago. Also prior history of  Appendectomy.  Recent stress test reportedly negative. Had recent EGD and colonoscopy which patient states was unremarkable. He does relate history of diverticular disease. Had benign polyp biopsied   Review of Systems  Constitutional: Positive for fever. Negative for chills and appetite change.  Respiratory: Negative for cough and shortness of breath.   Cardiovascular: Negative for chest pain, palpitations and leg swelling.  Gastrointestinal: Positive for abdominal pain. Negative for nausea, vomiting, diarrhea, constipation, blood in stool and abdominal distention.  Genitourinary: Negative for dysuria and hematuria.  Musculoskeletal: Negative for back pain.  Neurological: Negative for syncope.  Hematological: Negative for adenopathy.       Objective:   Physical Exam  patient is alert and nontoxic in appearance. Vital signs significant for temperature 99.3. Oropharynx is moist and clear Eardrums are normal Nasal exam unremarkable Neck supple no adenopathy Chest clear to auscultation Heart regular rhythm and rate Abdomen normal bowel sounds, nondistended , soft with some mild to moderate tenderness right and left lower quadrants. No guarding or rebound. No masses palpated.       Assessment & Plan:   bilateral lower abdominal pain. Question recurrent  diverticulitis. Start Cipro 500 mg twice a day for 10 days and metronidazole 500 mg 3 times a day for 10 days. Follow up promptly if symptoms worsen or persist.

## 2010-03-03 NOTE — Patient Instructions (Signed)
Please call if  Symptoms persist or worsen.

## 2010-03-04 NOTE — Procedures (Addendum)
Summary: Upper Endoscopy  Patient: Steven [name] Note: All result statuses are Final unless otherwise noted.  Tests: (1) Upper Endoscopy (EGD)   EGD Upper Endoscopy       DONE     [facility]     [address]     [place], [place]  [address]           ENDOSCOPY PROCEDURE REPORT           PATIENT:  Steven, [name]  MR#:  [id number]     BIRTHDATE:  27-Feb-1950, 59 yrs. old  GENDER:  male     ENDOSCOPIST:  Rachael Fee, MD     PROCEDURE DATE:  02/24/2010     PROCEDURE:  EGD, diagnostic 43235     ASA CLASS:  Class II     INDICATIONS:  "non-cardiac" chest pain, h/o achalasia diagnosed by     Dr. Ewing Schlein (unclear how this was diagnosed)     MEDICATIONS:  There was residual sedation effect present from     prior procedure., Fentanyl 25 mcg IV, Versed 3 mg IV     TOPICAL ANESTHETIC:  Exactacain Spray           DESCRIPTION OF PROCEDURE:   After the risks benefits and     alternatives of the procedure were thoroughly explained, informed     consent was obtained.  The LB GIF-H180 [id number] endoscope was     introduced through the mouth and advanced to the second portion of     the duodenum, without limitations.  The instrument was slowly     withdrawn as the mucosa was fully examined.     <<PROCEDUREIMAGES>>     There was a small hiatal hernia. The GE junction above this was     somewhat snug without mucosal defects (see image5 and image6).     Otherwise the examination was normal (see image3, image1, and     image2).    Retroflexed views revealed no abnormalities.    The     scope was then withdrawn from the patient and the procedure     completed.     COMPLICATIONS:  None           ENDOSCOPIC IMPRESSION:     1) Small hiatal hernia, snug GE junction without mucosal     abnormality.  ?underlying motility disorder.     2) Otherwise normal examination           RECOMMENDATIONS:     Dr. Christella Hartigan office will get in touch with [facility] to see what     workup you had for achalasia  (especially an esophageal manometry     test). Depending on those results, you may need further testing to     check for underlying esophageal motility (muscle) disorder.     ______________________________     Rachael Fee, MD           n.     eSIGNED:   Rachael Fee at 02/24/2010 03:32 PM           Garcialopez, Molly Maduro, [id number]  Note: An exclamation mark (!) indicates a result that was not dispersed into the flowsheet. Document Creation Date: 02/24/2010 3:32 PM _______________________________________________________________________  (1) Order result status: Final Collection or observation date-time: 02/24/2010 15:22 Requested date-time:  Receipt date-time:  Reported date-time:  Referring Physician:   Ordering Physician: Rob Bunting [phone]) Specimen Source:  Source: Launa Grill Order Number: [id number] Lab site:  Appended Document: Upper Endoscopy called [facility] and they are sending any Achalasia testing today  Appended Document: Upper Endoscopy reviewed records:Esophageal manometry reading by Dr. Ewing Schlein, 07/1998: "overall an essentially normal manometry."  It is still unclear to me how the diagnosis (and treatments) for achalasia were arrived at.    patty, can you set him up with esophageal manometry for dysphagia, check for achalasia.  tahnks  Appended Document: Orders Update/Manometry    Clinical Lists Changes  Orders: Added new Test order of Manometry (Manometry) - Signed      Appended Document: Upper Endoscopy Dr Christella Hartigan do you want the pt to remain on PPI?  Appended Document: Upper Endoscopy pt has been scheduled and instructed  Appended Document: Upper Endoscopy yes, he should stay on PPI  Appended Document: Upper Endoscopy pt aware

## 2010-03-04 NOTE — Procedures (Addendum)
Summary: Colonoscopy  Patient: Steven [name] Note: All result statuses are Final unless otherwise noted.  Tests: (1) Colonoscopy (COL)   COL Colonoscopy           DONE     [facility]     [address]     [place], [place]  [address]           COLONOSCOPY PROCEDURE REPORT           PATIENT:  Steven [name], Steven [name]  MR#:  [id number]     BIRTHDATE:  02/05/1950, 59 yrs. old  GENDER:  male     ENDOSCOPIST:  Rachael Fee, MD     PROCEDURE DATE:  02/24/2010     PROCEDURE:  Colonoscopy with biopsy     ASA CLASS:  Class II     INDICATIONS:  adenomatous polyp removed 2007 (Dr. Ewing Schlein); recent     chest pain, recent luq pain, recent anorectal pains     MEDICATIONS:  Fentanyl 75 mcg IV, Versed 5 mg IV     DESCRIPTION OF PROCEDURE:   After the risks benefits and     alternatives of the procedure were thoroughly explained, informed     consent was obtained.  Digital rectal exam was performed and     revealed no rectal masses.   The LB CF-H180AL [id number] endoscope     was introduced through the anus and advanced to the cecum, which     was identified by both the appendix and ileocecal valve, without     limitations.  The quality of the prep was good, using MoviPrep.     The instrument was then slowly withdrawn as the colon was fully     examined.     <<PROCEDUREIMAGES>>     FINDINGS:  A diminutive polyp was found in the descending colon     (see image7).  This was removed with biopsy (pathology jar 1).     Moderate diverticulosis was found throughout the colon (see     image3).  External Hemorrhoids were found. These were small.  This     was otherwise a normal examination of the colon (see image4,     image6, and image9).   Retroflexed views in the rectum revealed no     abnormalities.    The scope was then withdrawn from the patient     and the procedure completed.     COMPLICATIONS:  None           ENDOSCOPIC IMPRESSION:     1) Diminutive polyp in the descending colon, removed and sent  to     pathology     2) Moderate diverticulosis throughout the colon     3) External hemorrhoids, these are probably the cause of     intermittent rectal discomfort.     4) Otherwise normal examination           RECOMMENDATIONS:     1) Given your significant family history of colon cancer, you     should have a repeat colonoscopy in 5 years even if the polyp     removed today is not precancerous.     2) You will receive a letter within 1-2 weeks with the results     of your biopsy as well as final recommendations. Please call my     office if you have not received a letter after 3 weeks.     3) Try OTC Preparation H with next episode of  anal "spasm"           ______________________________     Rachael Fee, MD           n.     eSIGNED:   Rachael Fee at 02/24/2010 03:15 PM           Steven [name], Steven [name], [id number]  Note: An exclamation mark (!) indicates a result that was not dispersed into the flowsheet. Document Creation Date: 02/24/2010 3:15 PM _______________________________________________________________________  (1) Order result status: Final Collection or observation date-time: 02/24/2010 15:07 Requested date-time:  Receipt date-time:  Reported date-time:  Referring Physician:   Ordering Physician: Rob Bunting [phone]) Specimen Source:  Source: Launa Grill Order Number: [id number] Lab site:   Appended Document: Colonoscopy     Procedures Next Due Date:    Colonoscopy: 02/2015

## 2010-03-04 NOTE — Progress Notes (Signed)
Summary: Talk to [facility]  Phone Note Call from Patient Call back at Home Phone [phone]   Call For: Steven Cluster, NP Reason for Call: Talk to Nurse Summary of Call: Question about his meds. Initial call taken by: Leanor Kail [place] [facility],  February 23, 2010 11:43 AM  Follow-up for Phone Call        LM for pt to call me back.  Pt's wife called and was asking about the med that he was to take twice daily.  It was the Aciphex.  On his instructions we listed Prilosec, but he has Aciphex.   Follow-up by: Joselyn Glassman,  February 23, 2010 1:41 PM

## 2010-03-10 ENCOUNTER — Telehealth: Payer: Self-pay | Admitting: Family Medicine

## 2010-03-10 MED ORDER — HYDROCORTISONE ACE-PRAMOXINE 1-1 % RE FOAM: 1.0000 | Freq: Three times a day (TID) | RECTAL | Status: AC

## 2010-03-10 NOTE — Telephone Encounter (Signed)
Pt needs new rx proctofoam hs call into [facility] [address] [phone] for hemorroids

## 2010-03-10 NOTE — Letter (Signed)
Summary: Results Letter  [facility]  [address] [place], [place] [address]   Phone: [phone]  Fax: [phone]        March 03, 2010 MRN: [id number]    [facility] [address] [place], [place]  [address]    Dear Mr. STICKLER,   Good news.  The polyps that were removed during your recent procedure were NOT pre-cancerous.  Given your family history of colon cancer you will need a repeat colonoscopy in 5 years.  We will put your information in our reminder system and contact you around that time.    Sincerely,  Rachael Fee MD  This letter has been electronically signed by your physician.  Appended Document: Results Letter letter mailed

## 2010-03-10 NOTE — Procedures (Signed)
Summary: Manometry/Marc Magod MD  Manometry/Marc Magod MD   Imported By: Lester Cushing 03/03/2010 10:30:30  _____________________________________________________________________  External Attachment:    Type:   Image     Comment:   External Document

## 2010-03-13 ENCOUNTER — Encounter: Payer: Self-pay | Admitting: Family Medicine

## 2010-03-13 ENCOUNTER — Ambulatory Visit (INDEPENDENT_AMBULATORY_CARE_PROVIDER_SITE_OTHER): Payer: 59 | Admitting: Family Medicine

## 2010-03-13 VITALS — BP 128/80 | HR 107 | Temp 98.7°F | Resp 16 | Wt 190.0 lb

## 2010-03-13 DIAGNOSIS — K5732 Diverticulitis of large intestine without perforation or abscess without bleeding: Secondary | ICD-10-CM

## 2010-03-13 DIAGNOSIS — R109 Unspecified abdominal pain: Secondary | ICD-10-CM

## 2010-03-13 DIAGNOSIS — R1031 Right lower quadrant pain: Secondary | ICD-10-CM

## 2010-03-13 DIAGNOSIS — K5792 Diverticulitis of intestine, part unspecified, without perforation or abscess without bleeding: Secondary | ICD-10-CM

## 2010-03-13 LAB — POCT URINALYSIS DIPSTICK
Blood, UA: NEGATIVE
Spec Grav, UA: 1.025
pH, UA: 5.5

## 2010-03-13 MED ORDER — CEFTRIAXONE SODIUM 1 G IJ SOLR: 1.0000 g | INTRAMUSCULAR | Status: DC

## 2010-03-13 MED ORDER — CEFTRIAXONE SODIUM 1 G IJ SOLR
1.0000 g | Freq: Once | INTRAMUSCULAR | Status: AC
  Administered 2010-03-13: 500 g via INTRAMUSCULAR

## 2010-03-13 MED ORDER — METRONIDAZOLE 500 MG PO TABS: 500.0000 mg | ORAL_TABLET | Freq: Three times a day (TID) | ORAL | Status: AC

## 2010-03-13 MED ORDER — SULFAMETHOXAZOLE-TRIMETHOPRIM 800-160 MG PO TABS: 1.0000 | ORAL_TABLET | Freq: Two times a day (BID) | ORAL | Status: AC

## 2010-03-13 NOTE — Progress Notes (Signed)
  Subjective:    Patient ID: Steven [name], male    DOB: 03/16/1950, 60 y.o.   MRN: [id number]  HPI Here for continued fevers and LLQ abdominal pains. He had a colonoscopy on 02-24-10 showing diffuse diverticular disease, then he developed pain and fever several days after that. He was seen here on 03-03-10 and was felt to have diverticulitis. He was placed on Flagyl and Cipro, and he improved a good bit for about a week. Then 3 days ago the LLQ pains and low grade fevers returned. No nausea or vomiting at all. No urinary symptoms.    Review of Systems  Constitutional: Positive for fever and appetite change.  Respiratory: Negative.   Cardiovascular: Negative.   Gastrointestinal: Positive for abdominal pain and diarrhea. Negative for nausea, vomiting, constipation, blood in stool, abdominal distention, anal bleeding and rectal pain.  Genitourinary: Negative.        Objective:   Physical Exam  Constitutional: He appears well-developed and well-nourished. He appears distressed.  Cardiovascular: Normal rate, regular rhythm, normal heart sounds and intact distal pulses.   Pulmonary/Chest: Effort normal and breath sounds normal.  Abdominal: Soft. Bowel sounds are normal. He exhibits no distension and no mass. There is no rebound and no guarding.       Tender in the LUQ and LLQ           Assessment & Plan:  This is partially treated diverticulitis. Given a shot of Rocephin. Continue Flagyl for anaerobic coverage, but switch from Cipro to Bactrim DS for aerobic coverage.

## 2010-03-18 ENCOUNTER — Telehealth: Payer: Self-pay

## 2010-03-18 NOTE — Telephone Encounter (Signed)
Message copied by Kyung Rudd on Wed Mar 18, 2010  2:15 PM ------      Message from: Dwaine Deter      Created: Fri Mar 13, 2010 11:24 AM       normal

## 2010-03-18 NOTE — Telephone Encounter (Signed)
Pt aware.

## 2010-03-29 LAB — DIFFERENTIAL
Eosinophils Absolute: 0.1 10*3/uL (ref 0.0–0.7)
Eosinophils Relative: 1 % (ref 0–5)
Lymphs Abs: 1 10*3/uL (ref 0.7–4.0)
Monocytes Relative: 10 % (ref 3–12)

## 2010-03-29 LAB — CBC
HCT: 39.3 % (ref 39.0–52.0)
MCHC: 33.9 g/dL (ref 30.0–36.0)
MCHC: 34 g/dL (ref 30.0–36.0)
MCV: 88.4 fL (ref 78.0–100.0)
Platelets: 229 10*3/uL (ref 150–400)
RBC: 4.72 MIL/uL (ref 4.22–5.81)
RDW: 13.9 % (ref 11.5–15.5)
WBC: 5.8 10*3/uL (ref 4.0–10.5)

## 2010-03-29 LAB — COMPREHENSIVE METABOLIC PANEL
ALT: 202 U/L — ABNORMAL HIGH (ref 0–53)
ALT: 61 U/L — ABNORMAL HIGH (ref 0–53)
AST: 110 U/L — ABNORMAL HIGH (ref 0–37)
AST: 95 U/L — ABNORMAL HIGH (ref 0–37)
Albumin: 3.4 g/dL — ABNORMAL LOW (ref 3.5–5.2)
Alkaline Phosphatase: 70 U/L (ref 39–117)
BUN: 14 mg/dL (ref 6–23)
CO2: 26 mEq/L (ref 19–32)
CO2: 28 mEq/L (ref 19–32)
Calcium: 8.8 mg/dL (ref 8.4–10.5)
Calcium: 8.8 mg/dL (ref 8.4–10.5)
GFR calc Af Amer: 53 mL/min — ABNORMAL LOW (ref >60)
GFR calc Af Amer: >60 mL/min (ref >60)
GFR calc non Af Amer: 51 mL/min — ABNORMAL LOW (ref >60)
GFR calc non Af Amer: 52 mL/min — ABNORMAL LOW (ref >60)
Glucose, Bld: 101 mg/dL — ABNORMAL HIGH (ref 70–99)
Potassium: 4.2 mEq/L (ref 3.5–5.1)
Sodium: 137 mEq/L (ref 135–145)
Sodium: 140 mEq/L (ref 135–145)
Total Bilirubin: 2.9 mg/dL — ABNORMAL HIGH (ref 0.3–1.2)
Total Protein: 6.2 g/dL (ref 6.0–8.3)
Total Protein: 6.2 g/dL (ref 6.0–8.3)

## 2010-03-29 LAB — LIPASE, BLOOD: Lipase: 30 U/L (ref 11–59)

## 2010-03-30 ENCOUNTER — Ambulatory Visit (HOSPITAL_COMMUNITY)
Admission: RE | Admit: 2010-03-30 | Discharge: 2010-03-30 | Disposition: A | Discharge status: Home / Self Care | Payer: 59 | Source: Ambulatory Visit | Attending: Gastroenterology | Admitting: Gastroenterology

## 2010-03-30 DIAGNOSIS — K219 Gastro-esophageal reflux disease without esophagitis: Secondary | ICD-10-CM | POA: Insufficient documentation

## 2010-03-30 DIAGNOSIS — R1310 Dysphagia, unspecified: Secondary | ICD-10-CM | POA: Insufficient documentation

## 2010-04-13 ENCOUNTER — Telehealth: Payer: Self-pay | Admitting: Gastroenterology

## 2010-04-13 NOTE — Telephone Encounter (Signed)
Patty, please call him.  Esophageal manometry reading does suggest that he may have mild achalasia.  He has had botox injection in the past and says it helped.  Please set him up with egd at [facility] for botox injection for dysphagia.  thanks

## 2010-04-13 NOTE — Telephone Encounter (Signed)
Left message on machine to call back  

## 2010-04-14 NOTE — Telephone Encounter (Signed)
Left message on machine to call back  

## 2010-04-15 NOTE — Telephone Encounter (Signed)
Unable to reach pt I have left several messages for return call letter mailed.

## 2010-04-16 LAB — COMPREHENSIVE METABOLIC PANEL
Alkaline Phosphatase: 66 U/L (ref 39–117)
BUN: 18 mg/dL (ref 6–23)
CO2: 25 mEq/L (ref 19–32)
Calcium: 9.7 mg/dL (ref 8.4–10.5)
GFR calc non Af Amer: 40 mL/min — ABNORMAL LOW (ref >60)
Glucose, Bld: 123 mg/dL — ABNORMAL HIGH (ref 70–99)
Potassium: 5 mEq/L (ref 3.5–5.1)
Total Protein: 7.3 g/dL (ref 6.0–8.3)

## 2010-04-16 LAB — URINALYSIS, ROUTINE W REFLEX MICROSCOPIC
Bilirubin Urine: NEGATIVE
Glucose, UA: NEGATIVE mg/dL
Hgb urine dipstick: NEGATIVE
Ketones, ur: NEGATIVE mg/dL
pH: 7.5 (ref 5.0–8.0)

## 2010-04-16 LAB — CBC
HCT: 44.1 % (ref 39.0–52.0)
Hemoglobin: 15.4 g/dL (ref 13.0–17.0)
MCHC: 34.8 g/dL (ref 30.0–36.0)
RBC: 5.01 MIL/uL (ref 4.22–5.81)
RDW: 12.4 % (ref 11.5–15.5)

## 2010-04-16 LAB — DIFFERENTIAL
Basophils Relative: 1 % (ref 0–1)
Monocytes Relative: 7 % (ref 3–12)
Neutro Abs: 6.3 10*3/uL (ref 1.7–7.7)
Neutrophils Relative %: 71 % (ref 43–77)

## 2010-04-16 LAB — LIPASE, BLOOD: Lipase: 31 U/L (ref 11–59)

## 2010-04-17 LAB — COMPREHENSIVE METABOLIC PANEL
ALT: 49 U/L (ref 0–53)
BUN: 25 mg/dL — ABNORMAL HIGH (ref 6–23)
CO2: 28 mEq/L (ref 19–32)
Calcium: 9.2 mg/dL (ref 8.4–10.5)
Creatinine, Ser: 1.48 mg/dL (ref 0.4–1.5)
GFR calc non Af Amer: 49 mL/min — ABNORMAL LOW (ref >60)
Glucose, Bld: 90 mg/dL (ref 70–99)
Total Protein: 7 g/dL (ref 6.0–8.3)

## 2010-04-17 LAB — URINALYSIS, ROUTINE W REFLEX MICROSCOPIC
Glucose, UA: NEGATIVE mg/dL
Glucose, UA: NEGATIVE mg/dL
Ketones, ur: NEGATIVE mg/dL
Ketones, ur: NEGATIVE mg/dL
Nitrite: NEGATIVE
Nitrite: NEGATIVE
Protein, ur: NEGATIVE mg/dL
Protein, ur: NEGATIVE mg/dL
Urobilinogen, UA: 1 mg/dL (ref 0.0–1.0)
pH: 7 (ref 5.0–8.0)

## 2010-04-17 LAB — DIFFERENTIAL
Basophils Absolute: 0 10*3/uL (ref 0.0–0.1)
Basophils Relative: 1 % (ref 0–1)
Eosinophils Absolute: 0.1 10*3/uL (ref 0.0–0.7)
Eosinophils Absolute: 0.2 10*3/uL (ref 0.0–0.7)
Eosinophils Relative: 4 % (ref 0–5)
Lymphs Abs: 1.5 10*3/uL (ref 0.7–4.0)
Monocytes Relative: 7 % (ref 3–12)
Neutro Abs: 4.3 10*3/uL (ref 1.7–7.7)
Neutrophils Relative %: 67 % (ref 43–77)
Neutrophils Relative %: 52 % (ref 43–77)

## 2010-04-17 LAB — BASIC METABOLIC PANEL
BUN: 20 mg/dL (ref 6–23)
Chloride: 107 mEq/L (ref 96–112)
Creatinine, Ser: 1.79 mg/dL — ABNORMAL HIGH (ref 0.4–1.5)
Glucose, Bld: 130 mg/dL — ABNORMAL HIGH (ref 70–99)
Potassium: 3.5 mEq/L (ref 3.5–5.1)

## 2010-04-17 LAB — CBC
HCT: 41.6 % (ref 39.0–52.0)
HCT: 42.5 % (ref 39.0–52.0)
Hemoglobin: 14.2 g/dL (ref 13.0–17.0)
MCHC: 34.2 g/dL (ref 30.0–36.0)
MCHC: 34.2 g/dL (ref 30.0–36.0)
MCV: 88.9 fL (ref 78.0–100.0)
MCV: 88.7 fL (ref 78.0–100.0)
Platelets: 246 10*3/uL (ref 150–400)
RBC: 4.68 MIL/uL (ref 4.22–5.81)
RDW: 13.3 % (ref 11.5–15.5)
RDW: 13.8 % (ref 11.5–15.5)

## 2010-04-17 LAB — LIPASE, BLOOD: Lipase: 31 U/L (ref 11–59)

## 2010-04-17 LAB — URINE CULTURE: Culture: NO GROWTH

## 2010-04-17 LAB — URINE MICROSCOPIC-ADD ON

## 2010-04-20 ENCOUNTER — Telehealth: Payer: Self-pay | Admitting: *Deleted

## 2010-04-20 NOTE — Telephone Encounter (Signed)
Pt's wife called to report they received a letter and they don't understand what it means.  Read last note to them and asked if it was about BOTOX injections and she stated yes. Explained Dr Christella Hartigan stated pt has mild achalasia and wants to schedule him for EGD with Botox at [facility]. Pt and wife stated they would rather speak to Dr Christella Hartigan first- pt was given a f/u Appt 05/22/2010 at 2:15 to discuss this with him.

## 2010-05-22 ENCOUNTER — Ambulatory Visit (INDEPENDENT_AMBULATORY_CARE_PROVIDER_SITE_OTHER): Payer: 59 | Admitting: Gastroenterology

## 2010-05-22 ENCOUNTER — Encounter: Payer: Self-pay | Admitting: Gastroenterology

## 2010-05-22 ENCOUNTER — Other Ambulatory Visit (INDEPENDENT_AMBULATORY_CARE_PROVIDER_SITE_OTHER): Payer: 59

## 2010-05-22 VITALS — BP 140/82 | HR 68 | Ht 70.0 in | Wt 192.0 lb

## 2010-05-22 DIAGNOSIS — R109 Unspecified abdominal pain: Secondary | ICD-10-CM

## 2010-05-22 DIAGNOSIS — R1310 Dysphagia, unspecified: Secondary | ICD-10-CM

## 2010-05-22 LAB — CBC WITH DIFFERENTIAL/PLATELET
Eosinophils Relative: 1.1 % (ref 0.0–5.0)
Lymphocytes Relative: 35.9 % (ref 12.0–46.0)
MCV: 82.8 fl (ref 78.0–100.0)
Monocytes Absolute: 0.3 10*3/uL (ref 0.1–1.0)
Monocytes Relative: 7.2 % (ref 3.0–12.0)
Neutrophils Relative %: 55.6 % (ref 43.0–77.0)
Platelets: 159 10*3/uL (ref 150.0–400.0)
WBC: 4.1 10*3/uL — ABNORMAL LOW (ref 4.5–10.5)

## 2010-05-22 LAB — BASIC METABOLIC PANEL
Chloride: 104 mEq/L (ref 96–112)
GFR: 60.61 mL/min (ref >60.00)
Glucose, Bld: 82 mg/dL (ref 70–99)
Potassium: 4.6 mEq/L (ref 3.5–5.1)
Sodium: 142 mEq/L (ref 135–145)

## 2010-05-22 NOTE — Patient Instructions (Addendum)
You will be set up for a CT scan of abdomen and pelvis with IV and oral contrast (for left sided abd pains). Nothing to eat or drink 4 hours prior. Drink one bottle of contrast at 11:00am and another at 12:00pm on the day of your CT scan. You will have labs checked today in the basement lab.  Please head down after you check out with the front desk  (cbc, bmet). If your swallowing gets worse, more bothersome please call Dr. Christella Hartigan to consider repeat Botox, or consider heller myotomy.  A copy of this information will be made available to Dr. Clent Ridges.

## 2010-05-22 NOTE — Progress Notes (Signed)
Review of pertinent gastrointestinal problems: 1. family history of colon cancer (father). Colonoscopy February 2012 found hyperplastic polyp, recall colonoscopy at 5 year interval 2. ? Esophageal dysmotility, achalasia.  Botox injection by previous gastroenterologist Magod.  EGD February 2012 suggested a snug E. junction, esophageal manometry March, 2012 showed normal peristalsis but high residual lower esophageal sphincter pressure.  Previoulsy was having dysphagia (around 2000 or so).  Botox injection by [facility] helped, but caused worse reflux problems.  HPI: This is a very pleasant 60 year old man whom I last saw the time of upper and lower endoscopies about 3 months ago. See those results summarized above  He occasionally with have swallowing problems.  Up to 3-4 times a week, but sometimes no issues for 2-3 weeks. Feels like water can get plugged up.  Rarely issues with solids. Overall he has lost weight. He is not too bothered by these symptoms. He does not want repeat Botox injection   Tells me he had diveticulutis following colonoscopy.  He went to see his primary care physician about a week after his colonoscopy with complaints of left-sided abdominal pain. He had mild fevers as well. He did not get imaging studies that he underwent a course of ciprofloxacin and Flagyl without much improvement. This was changed to Bactrim I believe. He is still bothered by intermittent, left-sided abdominal pains. Looking back in his history this is a chronic complaint for him and noted January 2012, prior to his colonoscopy, he was having "chronic intermittent left-sided abdominal pains".     Physical Exam: BP 140/82  Pulse 68  Ht 5\' 10"  (1.778 m)  Wt 192 lb (87.091 kg)  BMI 27.55 kg/m2 Constitutional: generally well-appearing Psychiatric: alert and oriented x3 Abdomen: soft, mild left-sided abdominal discomfort, tenderness , nondistended, no obvious ascites, no peritoneal signs, normal bowel  sounds    Assessment and plan: 60 y.o. male with Question early achalasia, left-sided abdominal pain  He has tried only liquid dysphasia, EGD suggesting a smooth narrowing at the GE junction, esophageal manometry showing elevated resting lower esophageal sphincter pressure with normal peristalsis in the body of the esophagus. He had previous Botox injection by another gastroenterologist with good relief of his dysphagia symptoms however he had significant reflux after that and is not interested in repeat injection at this point. He will call if his symptoms become more bothersome.  He had left-sided abdominal pain about a week after his colonoscopy. This was attributed to diverticulitis. He is still somewhat bothered by left-sided pains, these are chronic complaint for him however I would like to set up CT scan abdomen and pelvis to check for potential other problems, perhaps a walled off perforation from colonoscopy. He will get a set of labs including a CBC and complete metabolic profile as well.

## 2010-05-26 ENCOUNTER — Ambulatory Visit (INDEPENDENT_AMBULATORY_CARE_PROVIDER_SITE_OTHER)
Admission: RE | Admit: 2010-05-26 | Discharge: 2010-05-26 | Disposition: A | Discharge status: Home / Self Care | Payer: 59 | Source: Ambulatory Visit | Attending: Gastroenterology | Admitting: Gastroenterology

## 2010-05-26 DIAGNOSIS — R109 Unspecified abdominal pain: Secondary | ICD-10-CM

## 2010-05-26 MED ORDER — IOHEXOL 300 MG/ML  SOLN
100.0000 mL | Freq: Once | INTRAMUSCULAR | Status: AC | PRN
  Administered 2010-05-26: 100 mL via INTRAVENOUS

## 2010-05-26 NOTE — Assessment & Plan Note (Signed)
[facility] OFFICE NOTE   KASEY, EWINGS                        MRN:          [id number]  DATE:07/01/2006                            DOB:          07/19/1950    This is a 60 year old gentleman here for a complete physical  exanimation.  In general he is doing well and has no complaints for me.  Of note, I saw him here on June 4 after he got a fish hook caught in his  right thumb.  This was extracted that day and he was given a course of  Augmentin.  He has recovered well with no problems.  Also, last week Dr.  Annalee Genta did some sinus surgery on him and he currently on a short  course of Levaquin for that.  Otherwise for details of his past medical  history, family history, social history, habits, etc., I refer you to  our last physical note dated June 22, 2005.  He had upper and lower  endoscopy performed in June 2007 by Dr. Ewing Schlein.  This showed only hiatal  hernia, some diverticulosis, and some hemorrhoids.  A repeat colonoscopy  was recommended in 5 years.   ALLERGIES:  PREDNISONE, AMITRIPTYLINE, AND AUGMENTIN CAUSES GASTRIC  UPSET.   CURRENT MEDICATIONS:  1. Tricor 145 mg per day.  2. Aciphex 20 mg per day.  3. Zetia 10 mg per day.  4. Multivitamins daily.  5. He also uses Relpax 40 mg as needed for migraine headaches.  6. Ambien CR 12.5 mg nightly as needed for sleep.  7. Pamine Forte as needed for abdominal cramps.  8. Valtrex 500 mg as needed for fever blisters.   OBJECTIVE:  Height 5 foot 10 inches, weight 200, blood pressure 132/86,  pulse 78 and regular.  GENERAL:  He appears to be doing quite well.  SKIN:  Clear.  EYES:  Clear.  EARS:  Clear.  PHARYNX:  Clear.  NECK:  Supple without lymphadenopathy or masses.  LUNGS:  Clear.  CARDIAC: Rate and rhythm regular without gallops, murmurs, or rubs.  Distal pulses are full.  EKG is within normal limits.  ABDOMEN:  Soft, normal bowel sounds.  GENITALIA:  Normal male, is circumcised.  RECTAL:  No mass or tenderness, stool Hemoccult negative.  Prostate is  within normal limits.  NEUROLOGIC:  Exam grossly intact.   He was here for fasting labs on June 11, these were all within normal  limits except for his lipid panel.  Triglycerides are elevated to 225,  however this is the lowest value that I have seen for [place] since we  started treating it.  VLDL is slightly high at 45, HDL is low at 3, LDL  is slightly high at 119.   ASSESSMENT/PLAN:  1. Complete physical exam, Examiner reminded him to get regular      exercise.  2. Hyperlipidemia.  We will continue with our current regimen and I      encouraged him to continue to watch his strict diet.  3. Insomnia, Stable.  I refilled Ambien CR for the  coming year.  4. Herpes labialis.  5. Gastroesophageal reflux disease, stable.  6. Recent sinus surgery, he will follow up with Dr. Annalee Genta.     Tera Mater. Clent Ridges, MD  Electronically Signed    SAF/MedQ  DD: 07/01/2006  DT: 07/01/2006  Job #: [id number]

## 2010-05-26 NOTE — Assessment & Plan Note (Signed)
[facility]                                 ON-CALL NOTE   MARJORIE, LUSSIER                          MRN:          [id number]  DATE:06/27/2006                            DOB:          1950-03-06    TIME RECEIVED:  6:51 p.m.   The patient is Steven [name], the caller is his wife.  He sees Dr. Clent Ridges.   TELEPHONE:  [id number].   The patient has been having swelling and pain around hemorrhoids for the  past 3 days.  His bowel movements are regular, there has been no  bleeding.  He has had no success with his over-the-counter agents.  My  response is to call in ProctoFoam [facility] to apply 3 times daily as needed to  [facility] at [phone].  If this is not successful, he can followup with me in  the office.     Tera Mater. Clent Ridges, MD  Electronically Signed    SAF/MedQ  DD: 06/28/2006  DT: 06/28/2006  Job #: [id number]

## 2010-05-26 NOTE — H&P (Signed)
NAME:  Steven [name], Steven [name] NO.:  [id number]   MEDICAL RECORD NO.:  [id number]          PATIENT TYPE:  INP   LOCATION:  5523                         FACILITY:  [facility]   PHYSICIAN:  Tera Mater. Clent Ridges, MD     DATE OF BIRTH:  08/21/1950   DATE OF ADMISSION:  03/01/2007  DATE OF DISCHARGE:                              HISTORY & PHYSICAL   CHIEF COMPLAINT:  This is a 60 year old male who is admitted with left  leg DVT and was also found to have pulmonary emboli.   HISTORY OF PRESENT ILLNESS:  Four nights prior to admission the patient  began noticing a deep cramping pain in the left calf.  The pain then  spread from the left heel up to the back of the left thigh.  There was  no history of trauma, no redness or swelling.  He denied any chest pain  or shortness of breath.  He has never experienced anything like this  before.  He came to see me in my outpatient clinic the morning of  March 01, 2007.  At that point a DVT was suspected.  He was sent for  a venous Doppler which returned positive for an acute deep vein  thrombosis extending from the left proximal popliteal vein down to the  distal posterior tibial veins.  At that point, I asked the patient and  his wife to return to my clinic for further evaluation and treatment.  At that point during the afternoon, he was given a subcutaneous dose of  Lovenox 100 mg with the idea of beginning coumadinization that day as  well.  He was then sent for a contrasted chest CT scan at the [facility] CT  Facility.  I received a verbal report from [facility] at  approximately 4:30 p.m. that afternoon that the scan showed multiple  small bilateral pulmonary emboli.  I then spoke to the patient and his  wife with the result and recommended immediate hospitalization and he  and his wife drove to [facility] admissions office for a direct  admission.   PAST MEDICAL HISTORY:  1. Gastroesophageal reflux disease.  2. Cervical disk  disease.  3. Headaches.  4. History of nephrolithiasis.  5. Recurrent herpes simplex infections.  6. Insomnia.  7. Hyperlipidemia.   PAST SURGICAL HISTORY:  Cervical spine surgery.   HABITS:  He has never used tobacco.  He does not use alcohol.   SOCIAL HISTORY:  He is married.  He works full time.   ALLERGIES:  AUGMENTIN.   CURRENT MEDICATIONS:  1. TriCor 145 mg per day.  2. Aciphex 20 mg b.i.d.  3. Zetia 10 mg per day.  4. Relpax 20 mg per day.  5. Ambien CR 12.5 mg one at bedtime as needed.  6. Valtrex 500 mg once per day as needed.  7. Multivitamin daily.  8. Skelaxin 800 mg four times daily as needed.  9. Lidoderm 5% patches as needed.  10.Lortab 10/500 to use as needed for pain.   FAMILY HISTORY:  Unremarkable.   OBJECTIVE:  VITAL SIGNS:  Weight 192 pounds, temperature 98.7 degrees  orally, pulse 76 and regular, blood pressure sitting is 116/88 in the  left arm.  GENERAL:  The patient is breathing comfortably.  He is in no acute  distress but does walk with a slight limp.  HEENT:  Unremarkable.  NEUROLOGIC:  Grossly intact.  NECK:  Supple without lymphadenopathy or masses.  LUNGS:  Clear.  CARDIAC:  Rate and rhythm regular without gallops, murmurs, or rubs.  Distal pulses are full.  ABDOMEN:  Soft.  Normal bowel sounds.  Nontender.  No masses.  SKIN:  Free of significant lesions.  MUSCULOSKELETAL:  Unremarkable except for the left leg.  He does have  some very mild swelling and diffuse tenderness in the left calf  extending from the back of the knee down to the Achilles tendon.  No  discreet masses are felt.  No cords are felt.  Homan sign is positive.  There is no erythema and no warmth.   ASSESSMENT AND PLAN:  The patient presents with an acute left deep vein  thrombosis.  He also has evidence of bilateral pulmonary emboli.  He  received his first dose of Lovenox in the outpatient clinic.  He will  now be admitted for close observation as well as further   anticoagulation.  We will begin with oral Coumadin therapy and we will  bridge with Lovenox therapy according to the pharmacy protocol.  He will  be monitored closely and supported from a respiratory status if  necessary.  He has no known etiology for a hypercoagulable state but  this will be worked up further.  He did have a full hypercoagulation  profile drawn in the outpatient setting and this has been sent to  [facility] for evaluation.  This evaluation will continue in the  outpatient setting once he is discharged.      Tera Mater. Clent Ridges, MD  Electronically Signed     SAF/MEDQ  D:  03/02/2007  T:  03/02/2007  Job:  [id number]

## 2010-05-26 NOTE — Procedures (Signed)
DUPLEX DEEP VENOUS EXAM - LOWER EXTREMITY   INDICATION:  Bilateral lower extremity pain, L>R, with right pain more  recent occurrence.   HISTORY:  Edema:  Minimal.  Trauma/Surgery:  No.  Pain:  Yes, bilateral.  PE:  Yes.  Previous DVT:  02/18, left lower extremity popliteal and posterior  tibial veins.  Anticoagulants:  Coumadin.  Other:    DUPLEX EXAM:                CFV   SFV   PopV  PTV    GSV                R  L  R  L  R  L  R   L  R  L  Thrombosis    o  o  o  o  o  +  O   +  O  o  Spontaneous   +  +  +  +  +  0  +   0  +  +  Phasic        +  +  +  +  +  0  +   0  +  +  Augmentation  +  +  +  +  +  0  +   0  +  +  Compressible  +  +  +  +  +  0  +   0  +  +  Competent     +  +  +  +  +  0  +   0  +  +   Legend:  + - yes  o - no  p - partial  D - decreased    IMPRESSION:  1. Right lower extremity shows no evidence of deep venous thrombosis.  2. Left lower extremity shows evidence of acute deep venous thrombosis      in the popliteal and posterior tibial veins.  3. All other imaged veins appear patent.    _____________________________  Larina Earthly, M.D.   AS/MEDQ  D:  04/12/2007  T:  04/12/2007  Job:  [id number]   cc:   Rose Phi. Myna Hidalgo, M.D.  Tera Mater. Clent Ridges, MD

## 2010-05-26 NOTE — Discharge Summary (Signed)
NAME:  Steven [name], Steven [name] NO.:  [id number]   MEDICAL RECORD NO.:  [id number]          PATIENT TYPE:  INP   LOCATION:  5523                         FACILITY:  [facility]   PHYSICIAN:  Bruce Rexene Edison. Swords, MD    DATE OF BIRTH:  1950-03-03   DATE OF ADMISSION:  03/01/2007  DATE OF DISCHARGE:  03/03/2007                               DISCHARGE SUMMARY   PRIMARY CARE PHYSICIAN:  Jeannett Senior A. Clent Ridges, MD   DISCHARGE DIAGNOSES:  1. Acute pulmonary embolus in the right upper lobe, right middle lobe,      right lower lobe and left lower lobe.  2. Left lower extremity deep venous thrombosis.  3. Hyperlipidemia.  4. Gastroesophageal reflux disease.   HISTORY OF PRESENT ILLNESS:  Mr. Morriss is a 60 year old male who  presented to his primary care's office on March 01, 2007, secondary  to left leg pain.  He had no swelling at that time.  He was sent for  left lower extremity Doppler which was positive for DVT, and this was  followed by a CT angio of the chest which was positive for acute PE.  The patient was sent to the hospital for anticoagulation and further  evaluation.   COURSE OF HOSPITALIZATION:  1. Acute peripheral edema.  The patient was admitted.  His oxygen      saturation remained stable throughout this hospitalization.  He is      breathing without difficulty.  His heart rate is stable.  He is      currently maintained on full-dose Lovenox and has been instructed      on self administration.  We have confirmed that his co-pay was only      $15.00 with medication after discharge.  His INR time of discharge      is 1.2, and plan at this time of discharge to home on Coumadin 7.5      mg p.o. daily, with a Lovenox full-dose bridge to be continued for      48 hours post therapeutic INR.  The patient is complaining of left      leg pain and is encouraged to keep his left leg elevated.  He is      receiving good relief with Percocet.  He did not have as adequate      relief with  Vicodin during this admission.  We will discharge the      patient home with a prescription for 30 Percocet and follow up with      his primary care on Monday.   MEDICATIONS:  At time of discharge:  1. Lovenox 90 mg sub-cu injection q.12 h.  2. Coumadin 7.5 mg p.o. daily in the evening.  3. Percocet 5/25 1 tablet p.o. q.4 h. as needed.  4. Aciphex 20 mg p.o. b.i.d.  5. Tricor 145 mg p.o. daily.  6. Zetia 10 mg p.o. daily.  7. Relpax 40 mg as needed for migraines.   PERTINENT LABORATORY DATA:  At time of discharge INR 1.2, hemoglobin  15.2, hematocrit 44.2.   DISPOSITION:  Plan to discharge the  patient to home.   FOLLOW UP:  Follow up with Dr. Gershon Crane on Monday February 23 at  11:15 a.m.  He is instructed to return the ER, should he develop  worsening left lower extremity pain or increased shortness of breath.      Sandford Craze, NP      Valetta Mole. Swords, MD  Electronically Signed    MO/MEDQ  D:  03/03/2007  T:  03/04/2007  Job:  [id number]   cc:   Jeannett Senior A. Clent Ridges, MD

## 2010-05-27 ENCOUNTER — Telehealth: Payer: Self-pay

## 2010-05-27 DIAGNOSIS — D649 Anemia, unspecified: Secondary | ICD-10-CM

## 2010-05-27 NOTE — Telephone Encounter (Signed)
Pt aware labs in EPIC  

## 2010-05-29 NOTE — Op Note (Signed)
Steven [name], BARLOWE                          ACCOUNT NO.:  [id number]   MEDICAL RECORD NO.:  [id number]                   PATIENT TYPE:  AMB   LOCATION:  ENDO                                 FACILITY:  [facility]   PHYSICIAN:  Petra Kuba, M.D.                 DATE OF BIRTH:  1950/07/10   DATE OF PROCEDURE:  08/24/2001  DATE OF DISCHARGE:                                 OPERATIVE REPORT   PROCEDURE:  Esophagogastroduodenoscopy with Savary dilatation.   INDICATION:  The patient with recurrent dysphagia helped by dilatation in  the past.  Now with both swallowing problems and some atypical chest pain.  Consent was signed after risks, benefits, methods, options thoroughly  discussed in the office with both the patient and his wife.   MEDICINES USED:  Demerol 50, Versed 5.   DESCRIPTION OF PROCEDURE:  The scope was inserted by direct vision.  The  esophagus was normal.  In the distal esophagus, was initially some  significant esophageal spasm and almost had an achalasia-looking appearance.  However, with prolonged visualization and finally when the area relaxed, he  did have a tiny hiatal hernia and a widely patent fibrous ring with no signs  of esophagitis or other abnormalities.  The scope was inserted into the  stomach, advanced through a normal antrum, normal pylorus, into a normal  duodenal bulb and around the C-loop to a normal second portion of the  duodenum.  The scope was slowly withdrawn back to the bulb, and a good look  there ruled out ulcers in that location.  The scope was withdrawn back to  the stomach, and the stomach was evaluated on retroflexed and straight  visualization with a good look at the angularis, cardia, fundus, lesser and  greater curve without additional findings.  The scope was straightened, and  straight visualization of the stomach confirmed the above.  The scope was  then slowly withdrawn back to 20 cm which confirmed the above findings of  the  esophagus.  The scope was then advanced to the antrum, and a Savary wire  was advanced and confirmed in the proper position under fluoroscopy.  The  scope was removed, making sure to keep the wire in the proper location and  in succession the Savary 14, 16, and 17 mm dilators were all advanced into  the stomach.  All passed without resistance and without blood and were  confirmed in the proper position in the stomach.  Once the 17 was advanced,  the wire was withdrawn back into the dilator, and both were removed in  tandem.  The procedure was terminated.  There was no obvious immediate  complication.   ENDOSCOPIC DIAGNOSES:  1. Tiny hiatal hernia with a widely patent ring.  2. Increased distal esophageal spasm.  3. Otherwise normal EGD.   THERAPY:  Savary dilatation to 17 mm under fluoroscopy without blood or  resistance.   PLAN:  Go ahead and change his Nexium to Prevacid.  Consider a barium  swallow with a barium tablet, possibly a manometry and possibly even an  abdominal ultrasound if symptoms continue.  Otherwise ask him to call me  p.r.n. or follow up in 1-2 months with an update.                                               Petra Kuba, M.D.   MEM/MEDQ  D:  08/24/2001  T:  08/26/2001  Job:  [id number]

## 2010-05-29 NOTE — Cardiovascular Report (Signed)
NAME:  STEADMAN, PROSPERI                           ACCOUNT NO.:  [id number]   MEDICAL RECORD NO.:  [id number]                   PATIENT TYPE:  OIB   LOCATION:  2894                                 FACILITY:  [facility]   PHYSICIAN:  Charlies Constable, M.D.                  DATE OF BIRTH:  1950/09/14   DATE OF PROCEDURE:  10/31/2002  DATE OF DISCHARGE:                              CARDIAC CATHETERIZATION   CARDIOLOGIST:  Charlies Constable, M.D.   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  He has had esophageal  problems and esophageal stricture, and has been dilated by  Dr. Ewing Schlein.  He has had persistent symptoms and while he was putting up a  deer chair he developed substernal aching chest pain lasting five to 10  minutes.  Dr. Ewing Schlein referred him Dr. Antoine Poche for cardiac consultation and  Dr. Antoine Poche was concerned that his symptoms maybe ischemic, and arranged  for him to come into the hospital for angiography.   DESCRIPTION OF PROCEDURE:  The procedure was performed via the right femoral  artery using arterial sheath and 6 French preformed coronary catheters.  A  femoral arterial puncture was performed and Omnipaque was used.  The right  femoral artery was closed with Angio-Seal at the end of the procedure.   The patient tolerated the procedure well and left the laboratory in  satisfactory condition.   RESULTS:   ANGIOGRAPHIC DATA:  Left Main Coronary Artery:  The left main coronary  artery was free of significant disease.   Left Anterior Descending Artery:  The left anterior descending artery gave  rise to a diagonal branch and two septal perforators.  These and the LA  proper were free of significant disease.   Circumflex Artery:  The circumflex artery gave rise to an intermediate  branch, marginal branch, two posterolateral branches and a posterior  descending branch.  These vessels were free of  significant disease.   Right Coronary Artery:  The right coronary artery is a nondominant vessel  that supplied two right ventricular branches.  These vessels were free of  significant disease.   VENTRICULOGRAPHIC DATA:  Left Ventriculogram:  The left ventriculogram was  performed in the RAO projection and showed good wall motion, but no areas of  hypokinesis.  The estimated ejection fraction was 60%.   The aortic pressure was 120/79 with a mean of 98.  Left ventricular pressure  was 120/17.    CONCLUSION:  Normal coronary angiography and left ventricular wall motion.  Marland Kitchen   RECOMMENDATIONS:  Reassurance.  Charlies Constable, M.D.    BB/MEDQ  D:  10/31/2002  T:  10/31/2002  Job:  [id number]   cc:   Jeannett Senior A. Clent Ridges, M.D. [facility]   Petra Kuba, M.D.  [address]  [place]  [place] [address]  Fax: [phone]   Rollene Rotunda, M.D.

## 2010-05-29 NOTE — Op Note (Signed)
NAME:  Steven [name], Steven [name]                           ACCOUNT NO.:  [id number]   MEDICAL RECORD NO.:  [id number]                   PATIENT TYPE:  AMB   LOCATION:  ENDO                                 FACILITY:  [facility]   PHYSICIAN:  Petra Kuba, M.D.                 DATE OF BIRTH:  Jun 22, 1950   DATE OF PROCEDURE:  09/27/2002  DATE OF DISCHARGE:                                 OPERATIVE REPORT   PROCEDURE:  Esophagogastroduodenoscopy with Savary dilatation.   INDICATIONS:  The patient with dysphagia, abnormal barium swallow.  Consent  was signed after risks, benefits, methods, options were thoroughly discussed  with the patient and his wife multiple times in the past.   MEDICATIONS:  Demerol 7 mg, Versed 2 mg.   DESCRIPTION OF PROCEDURE:  The video endoscope was inserted by direct  vision.  The proximal and mid esophagus looked normal.  In the distal  esophagus, it looked more like achalasia with the folds coming together.  No  obvious ring, hiatal hernia or stricture were seen.  The scope did pass  readily into the stomach with a small popping and giving way that is  consistent with achalasia.  The stomach looked normal.  The scope advanced  ______________ .  Scope was withdrawn back to the stomach and retroflexed.  Angularis, cardia, fundus, lesser and greater curve were evaluated on  retroflexion and then straight visualization.  Straight visualization looked  normal.  Scope was slowly withdrawn back to 20 cm.  Again esophageal  abnormal was seen.  The scope was readvanced and again the distal esophagus  findings were confirmed as above.  The scope was advanced to the antrum.  Savary wire was advanced.  The customary J-tube was confirmed under fluoro.  The scope was removed making sure to keep the wire in the proper location  and succession.  Savary dilators, 14 and 16 mm,  were advanced.  The 14 mm  passed readily without resistance and without any heme.  The 16 mm did have  some  minimal resistance, possibly he was fighting his dilator.  There was no  heme on the dilator, however.  We went ahead and advanced to 17 and 18 under  fluoro with customary passage of the wire.  Neither had resistance.  Neither  had heme.  Once the tube was confirmed at the stomach, the wire was  withdrawn back in with a dilator.  Both were in tandem.  The procedure was  terminated at this junction.  The patient tolerated the procedure well.  There was no obvious immediate complications.   ENDOSCOPIC DIAGNOSES:  1. 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  2. Otherwise within normal esophagogastroduodenoscopy.   THERAPY:  Savary dilatation to 18 mm under fluoroscopy.   PLAN:  See how the dilation worked.  Consider a one-time prepared Botox  injection.  Follow up in six to eight weeks to recheck symptoms.  Make sure  no further work up plans are needed.       MEM/MEDQ  D:  09/27/2002  T:  09/28/2002  Job:  [id number]   cc:   Jeannett Senior A. Clent Ridges, M.D. [facility]

## 2010-05-29 NOTE — Op Note (Signed)
NAME:  BENGIE, KAUCHER                           ACCOUNT NO.:  [id number]   MEDICAL RECORD NO.:  [id number]                   PATIENT TYPE:  AMB   LOCATION:  ENDO                                 FACILITY:  [facility]   PHYSICIAN:  Petra Kuba, M.D.                 DATE OF BIRTH:  14-Feb-1950   DATE OF PROCEDURE:  09/24/2003  DATE OF DISCHARGE:                                 OPERATIVE REPORT   PROCEDURE:  Colonoscopy.   INDICATIONS:  Abdominal pain, due for colonic screening.  Consent was signed  after risks, benefits, methods, and options thoroughly discussed in the  office.   MEDICINES USED:  Demerol 80 mg, Versed 8 mg.   PROCEDURE:  Rectal inspection was pertinent for external hemorrhoids, small.  Digital exam was negative.  The video pediatric adjustable colonoscope was  inserted.  With abdominal pressure and rolling him on his back, we were able  to advance to the cecum.  Other than some left and right diverticula, no  abnormalities were seen on insertion.  The cecum was identified by the  appendiceal orifice and the ileocecal valve.  In fact, the scope was  inserted a short way into the terminal ileum, which was normal.  Photo  documentation was obtained.  The scope was slowly withdrawn.  Prep was  adequate.  There was some liquid stool that required washing and suctioning.  The worst of his diverticula were in the cecum.  The scope was slowly  withdrawn.  There was some in the more proximal ascending and then some in  the sigmoid and left side, but no other abnormalities were seen as we slowly  withdrew back to the rectum.  Anorectal pull-through and retroflexion  confirmed some small hemorrhoids.  The scope was straightened and readvanced  a short way up the left side of the colon, air was suctioned, the scope  removed.  The patient tolerated the procedure well.  There was no obvious  immediate complication.   ENDOSCOPIC DIAGNOSES:  1.  Internal-external hemorrhoids, small.  2.  Left and cecal diverticula.  3.  Otherwise within normal limits to the terminal ileum.   PLAN:  Repeat screening in five years.  Follow-up p.r.n. or in two months to  recheck symptoms and decide any other workup and plans.                                               Petra Kuba, M.D.    MEM/MEDQ  D:  09/24/2003  T:  09/24/2003  Job:  [id number]   cc:   Jeannett Senior A. Clent Ridges, M.D. [facility]

## 2010-05-29 NOTE — Op Note (Signed)
NAME:  Steven [name], Steven [name]                           ACCOUNT NO.:  [id number]   MEDICAL RECORD NO.:  [id number]                   PATIENT TYPE:  AMB   LOCATION:  ENDO                                 FACILITY:  [facility]   PHYSICIAN:  Petra Kuba, M.D.                 DATE OF BIRTH:  [birth year]   DATE OF PROCEDURE:  11/08/2002  DATE OF DISCHARGE:                                 OPERATIVE REPORT   PROCEDURE:  Esophagogastroduodenoscopy with Botox injection.   ENDOSCOPIST:  Petra Kuba, M.D.   INDICATIONS:  Patient with dysphagia not responding to dilation, with  endoscopic appearance compatible with achalasia although manometry did not  show that years ago.  The patient did not want to repeat the manometry and  prefers Botox injection.  Consent was signed as above.   PREMEDICATION:  Demerol 70 mg, Versed 7.5 mg.   DESCRIPTION OF PROCEDURE:  The video endoscope was inserted by direct  vision.  The proximal and mid esophagus was normal.  In the distal esophagus  was the customary spasm, with folds coming together.  The scope did pass  easily through that area with only minimal pressure.  Occasionally the GE  junction would relax, revealing a small hiatal hernia.  No obvious ring or  stricture was seen.  The scope passed into the antrum, pertinent for some  linear antritis, through a normal pylorus, into a normal duodenal bulb and  around the C-loop to a normal second portion of the duodenum.  The scope was  withdrawn back to the bulb, which confirmed its normal appearance.   The scope was withdrawn back to the stomach and retroflexed.  High in the  cardia the small hiatal hernia was confirmed.  Fundus, angularis, lesser and  greater curve were normal on retroflexed visualization, and straight  visualization of the stomach confirmed the antritis without any other  abnormalities.  The scope was then slowly withdrawn back to 20 cm and no  additional esophageal abnormalities were seen.  The  scope was reinserted  into the stomach, air was suctioned.   We then went ahead and proceeded with Botox injection, 4 mL of 25 units each  injected in four quadrants at the distal esophagus right at the spastic  area.  There was minimal heme but no obvious complication.  The scope was  removed.   The patient tolerated the procedure well.  There was no obvious immediate  complication.   ENDOSCOPIC DIAGNOSES:  1. Small hiatal hernia with distal esophageal spasm, which looked like     achalasia.  2. Linear antritis.  3. Otherwise, normal esophagogastroduodenoscopy.   THERAPY:  100 units of Botox injected in four injections of 1 mL in 25 units  each.   PLAN:  1. See how this works.  2.     Might use Levsin longer term or other antispasmodic, possibly with Librax.  3. Call me in two weeks if no better; otherwise, follow up in 4-6 weeks to     recheck symptoms and decide any other workup and plans.                                               Petra Kuba, M.D.    MEM/MEDQ  D:  11/08/2002  T:  11/08/2002  Job:  [id number]   cc:   Rollene Rotunda, M.D.   Tera Mater. Clent Ridges, M.D. [facility]

## 2010-06-11 ENCOUNTER — Other Ambulatory Visit (INDEPENDENT_AMBULATORY_CARE_PROVIDER_SITE_OTHER): Payer: 59

## 2010-06-11 DIAGNOSIS — D649 Anemia, unspecified: Secondary | ICD-10-CM

## 2010-06-11 LAB — CBC WITH DIFFERENTIAL/PLATELET
Basophils Absolute: 0 10*3/uL (ref 0.0–0.1)
Eosinophils Relative: 2.5 % (ref 0.0–5.0)
HCT: 45.1 % (ref 39.0–52.0)
Hemoglobin: 15.6 g/dL (ref 13.0–17.0)
Lymphs Abs: 1.7 10*3/uL (ref 0.7–4.0)
Monocytes Relative: 9.6 % (ref 3.0–12.0)
Neutro Abs: 3.2 10*3/uL (ref 1.4–7.7)
RDW: 13.8 % (ref 11.5–14.6)

## 2010-06-22 ENCOUNTER — Telehealth: Payer: Self-pay | Admitting: *Deleted

## 2010-06-22 DIAGNOSIS — R911 Solitary pulmonary nodule: Secondary | ICD-10-CM

## 2010-06-22 NOTE — Telephone Encounter (Signed)
Pt wants to have a CT done before his CPX. Left message on machine that md is out of the office and it would be next week before he gets back.

## 2010-06-29 NOTE — Telephone Encounter (Signed)
This is a 6 month follow up for a RML nodule. Will get this ordered.

## 2010-06-29 NOTE — Telephone Encounter (Signed)
Pt aware.

## 2010-06-30 ENCOUNTER — Other Ambulatory Visit: Payer: Self-pay | Admitting: Family Medicine

## 2010-06-30 DIAGNOSIS — R911 Solitary pulmonary nodule: Secondary | ICD-10-CM

## 2010-07-12 HISTORY — PX: URETERAL STENT PLACEMENT: SHX822

## 2010-07-16 ENCOUNTER — Other Ambulatory Visit: Payer: Self-pay | Admitting: Family Medicine

## 2010-07-16 NOTE — Telephone Encounter (Signed)
Pt leaving to go out of town. Needs a new script called in for Cortef-Nystatin-Ora/Pl swish 1 tsp by mouth every 4 hrs as needed. Pls call in to [facility].

## 2010-07-16 NOTE — Telephone Encounter (Signed)
Call in 300 ml with 2 rf

## 2010-07-16 NOTE — Telephone Encounter (Signed)
rx sent to pharmacy

## 2010-07-20 ENCOUNTER — Ambulatory Visit (HOSPITAL_BASED_OUTPATIENT_CLINIC_OR_DEPARTMENT_OTHER)
Admission: RE | Admit: 2010-07-20 | Discharge: 2010-07-20 | Disposition: A | Discharge status: Home / Self Care | Payer: 59 | Source: Ambulatory Visit | Attending: Urology | Admitting: Urology

## 2010-07-20 DIAGNOSIS — N201 Calculus of ureter: Secondary | ICD-10-CM | POA: Insufficient documentation

## 2010-07-20 DIAGNOSIS — Z86718 Personal history of other venous thrombosis and embolism: Secondary | ICD-10-CM | POA: Insufficient documentation

## 2010-07-20 DIAGNOSIS — N1330 Unspecified hydronephrosis: Secondary | ICD-10-CM | POA: Insufficient documentation

## 2010-07-20 DIAGNOSIS — Z01812 Encounter for preprocedural laboratory examination: Secondary | ICD-10-CM | POA: Insufficient documentation

## 2010-07-23 NOTE — Op Note (Signed)
  NAME:  DELLA, HOMAN NO.:  [id number]  MEDICAL RECORD NO.:  [id number]  LOCATION:                                 FACILITY:  PHYSICIAN:  Jmari Pelc I. Turhan Chill, M.D.DATE OF BIRTH:  1950/07/24  DATE OF PROCEDURE:  07/20/2010 DATE OF DISCHARGE:                              OPERATIVE REPORT   PREOPERATIVE DIAGNOSIS:  Left upper ureteral calculus with obstruction and hydronephrosis and pain and colic.  POSTOPERATIVE DIAGNOSIS:  Left upper ureteral calculus with obstruction and hydronephrosis and pain and colic.  OPERATIONS:  Cystourethroscopy, left retrograde pyelogram and interpretation, left double-J stent (6-French x 24 cm) with suture remaining.  SURGEON:  Kvion Shapley I. Patsi Sears, M.D.  ANESTHESIA:  General LMA.  PREPARATION:  After appropriate preanesthesia, the patient was brought to the operating room, placed on the operating room table in the dorsal supine position where general LMA anesthesia was introduced.  He was then replaced in dorsal lithotomy position where the pubis was prepped with Betadine solution and draped in usual fashion.  REVIEW OF HISTORY:  The patient is a 60 year old male with acute onset of left flank pain, with CT showing hydronephrosis secondary to left upper ureteral calculus measure approximately 5 mm.  The patient takes aspirin, could not have lithotripsy today.  Because the patient was seen by Dr. Jerilee Field, is now for double-J stent placement.  He will be for lithotripsy next week.  PROCEDURE:  Cystourethroscopy was accomplished and left retrograde pyelogram was accomplished which shows normal left ureter.  The patient tolerated the procedure well.  A string was left on per the patient's specific request.  It was taken to his penis.  The bladder drained of fluid, the patient was allowed to be discharged.  Note, the retrograde pyelogram showed the patient had normal-appearing ureter with no definite  hydronephrosis noted.  I was unable to positively identify the upper ureteral calculus, as seen on CT scan. Did see the double-J in excellent position, however.     Neithan Day I. Patsi Sears, M.D.     SIT/MEDQ  D:  07/20/2010  T:  07/20/2010  Job:  [id number]  cc:   Jerilee Field, MD Fax: [phone]  Electronically Signed by Jethro Bolus M.D. on 07/23/2010 08:38:55 AM

## 2010-07-30 ENCOUNTER — Other Ambulatory Visit (INDEPENDENT_AMBULATORY_CARE_PROVIDER_SITE_OTHER): Payer: 59

## 2010-07-30 ENCOUNTER — Ambulatory Visit (INDEPENDENT_AMBULATORY_CARE_PROVIDER_SITE_OTHER)
Admission: RE | Admit: 2010-07-30 | Discharge: 2010-07-30 | Disposition: A | Discharge status: Home / Self Care | Payer: 59 | Source: Ambulatory Visit | Attending: Family Medicine | Admitting: Family Medicine

## 2010-07-30 DIAGNOSIS — Z0000 Encounter for general adult medical examination without abnormal findings: Secondary | ICD-10-CM

## 2010-07-30 DIAGNOSIS — J984 Other disorders of lung: Secondary | ICD-10-CM

## 2010-07-30 DIAGNOSIS — R911 Solitary pulmonary nodule: Secondary | ICD-10-CM

## 2010-07-30 LAB — PSA: PSA: 0.61 ng/mL (ref 0.10–4.00)

## 2010-07-30 LAB — LIPID PANEL
Cholesterol: 174 mg/dL (ref 0–200)
Total CHOL/HDL Ratio: 4
VLDL: 45.4 mg/dL — ABNORMAL HIGH (ref 0.0–40.0)

## 2010-07-30 LAB — POCT URINALYSIS DIPSTICK
Blood, UA: NEGATIVE
Ketones, UA: NEGATIVE
Leukocytes, UA: NEGATIVE
Protein, UA: NEGATIVE
pH, UA: 6

## 2010-07-30 LAB — BASIC METABOLIC PANEL
BUN: 21 mg/dL (ref 6–23)
Chloride: 101 mEq/L (ref 96–112)
Glucose, Bld: 93 mg/dL (ref 70–99)
Potassium: 4.8 mEq/L (ref 3.5–5.1)
Sodium: 140 mEq/L (ref 135–145)

## 2010-07-30 LAB — CBC WITH DIFFERENTIAL/PLATELET
Eosinophils Relative: 2.5 % (ref 0.0–5.0)
HCT: 44.6 % (ref 39.0–52.0)
Hemoglobin: 15.3 g/dL (ref 13.0–17.0)
Lymphs Abs: 1.6 10*3/uL (ref 0.7–4.0)
MCV: 87.5 fl (ref 78.0–100.0)
Monocytes Absolute: 0.5 10*3/uL (ref 0.1–1.0)
Neutro Abs: 2.9 10*3/uL (ref 1.4–7.7)
Platelets: 253 10*3/uL (ref 150.0–400.0)
WBC: 5.2 10*3/uL (ref 4.5–10.5)

## 2010-07-30 LAB — HEPATIC FUNCTION PANEL
AST: 29 U/L (ref 0–37)
Albumin: 4.7 g/dL (ref 3.5–5.2)
Alkaline Phosphatase: 56 U/L (ref 39–117)
Bilirubin, Direct: 0.1 mg/dL (ref 0.0–0.3)
Total Bilirubin: 0.8 mg/dL (ref 0.3–1.2)

## 2010-07-31 ENCOUNTER — Telehealth: Payer: Self-pay

## 2010-07-31 NOTE — Telephone Encounter (Signed)
Pt's wife notified.

## 2010-07-31 NOTE — Telephone Encounter (Signed)
Message copied by Beverely Low on Fri Jul 31, 2010 10:03 AM ------      Message from: Gershon Crane A      Created: Thu Jul 30, 2010  1:28 PM       The nodules are stable and they look like simple lymph nodes. We will repeat another chest CT in one year

## 2010-08-06 ENCOUNTER — Ambulatory Visit (INDEPENDENT_AMBULATORY_CARE_PROVIDER_SITE_OTHER): Payer: 59 | Admitting: Family Medicine

## 2010-08-06 ENCOUNTER — Encounter: Payer: Self-pay | Admitting: Family Medicine

## 2010-08-06 VITALS — BP 106/78 | HR 69 | Temp 98.3°F | Ht 70.0 in | Wt 193.0 lb

## 2010-08-06 DIAGNOSIS — Z0000 Encounter for general adult medical examination without abnormal findings: Secondary | ICD-10-CM

## 2010-08-06 DIAGNOSIS — M79609 Pain in unspecified limb: Secondary | ICD-10-CM

## 2010-08-06 DIAGNOSIS — M79606 Pain in leg, unspecified: Secondary | ICD-10-CM

## 2010-08-06 MED ORDER — EZETIMIBE 10 MG PO TABS: 10.0000 mg | ORAL_TABLET | Freq: Every day | ORAL | Status: DC

## 2010-08-06 MED ORDER — FOLIC ACID 1 MG PO TABS: 1.0000 mg | ORAL_TABLET | Freq: Every day | ORAL | Status: DC

## 2010-08-06 MED ORDER — FENOFIBRATE 145 MG PO TABS: 145.0000 mg | ORAL_TABLET | Freq: Every day | ORAL | Status: DC

## 2010-08-06 NOTE — Progress Notes (Signed)
  Subjective:    Patient ID: Steven [name], male    DOB: [birth year], 60 y.o.   MRN: [id number]  HPI 60 yr old male for a cpx. He had been doing well until about a month ago when he had a left sided kidney stone. He saw Dr. Mena Goes who placed a stent in the left ureter. This allowed him to pass the stone, and he has done well since then as far as urinations go. Then about 3 weeks ago he developed an intermittent sharp pain in the right calf. No trauma hx. No swelling in the calf or foot. 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 No recent chest pain or SOB.    Review of Systems  Constitutional: Negative.   HENT: Negative.   Eyes: Negative.   Respiratory: Negative.   Cardiovascular: Negative.   Gastrointestinal: Negative.   Genitourinary: Negative.   Musculoskeletal: Negative.   Skin: Negative.   Neurological: Negative.   Hematological: Negative.   Psychiatric/Behavioral: Negative.        Objective:   Physical Exam  Constitutional: He is oriented to person, place, and time. He appears well-developed and well-nourished. No distress.  HENT:  Head: Normocephalic and atraumatic.  Right Ear: External ear normal.  Left Ear: External ear normal.  Nose: Nose normal.  Mouth/Throat: Oropharynx is clear and moist. No oropharyngeal exudate.  Eyes: Conjunctivae and EOM are normal. Pupils are equal, round, and reactive to light. Right eye exhibits no discharge. Left eye exhibits no discharge. No scleral icterus.  Neck: Neck supple. No JVD present. No tracheal deviation present. No thyromegaly present.  Cardiovascular: Normal rate, regular rhythm, normal heart sounds and intact distal pulses.  Exam reveals no gallop and no friction rub.   No murmur heard.      EKG normal  Pulmonary/Chest: Effort normal and breath sounds normal. No respiratory distress. He has no wheezes. He has no rales. He exhibits no tenderness.  Abdominal: Soft. Bowel sounds are  normal. He exhibits no distension and no mass. There is no tenderness. There is no rebound and no guarding.  Genitourinary: Rectum normal, prostate normal and penis normal. Guaiac negative stool. No penile tenderness.  Musculoskeletal: Normal range of motion. He exhibits tenderness. He exhibits no edema.       Tender in the right calf, bit no edema or cords. Negative Homans  Lymphadenopathy:    He has no cervical adenopathy.  Neurological: He is alert and oriented to person, place, and time. He has normal reflexes. No cranial nerve deficit. He exhibits normal muscle tone. Coordination normal.  Skin: Skin is warm and dry. No rash noted. He is not diaphoretic. No erythema. No pallor.  Psychiatric: He has a normal mood and affect. His behavior is normal. Judgment and thought content normal.          Assessment & Plan:  He is doing well in general, meds were refilled. I think the calf pain is muscular in origin, but we need to rule out another DVT. Set up a venous doppler ASAP. He is still on 81mg  of aspirin daily.

## 2010-08-07 ENCOUNTER — Encounter (INDEPENDENT_AMBULATORY_CARE_PROVIDER_SITE_OTHER): Payer: 59

## 2010-08-07 ENCOUNTER — Telehealth: Payer: Self-pay | Admitting: Family Medicine

## 2010-08-07 DIAGNOSIS — M79609 Pain in unspecified limb: Secondary | ICD-10-CM

## 2010-08-07 NOTE — Telephone Encounter (Signed)
Pts spouse called req status of get ultrasound done on pts leg. Pls call asap.

## 2010-08-07 NOTE — Telephone Encounter (Signed)
Pt is Negative for DVT. Pt is being sent home.

## 2010-08-07 NOTE — Telephone Encounter (Signed)
Please have this done today

## 2010-08-10 NOTE — Telephone Encounter (Signed)
noted 

## 2010-08-31 NOTE — Procedures (Unsigned)
DUPLEX DEEP VENOUS EXAM - LOWER EXTREMITY  INDICATION:  Right lower extremity calf pain for 3 weeks.  HISTORY:  Edema:  Yes. Trauma/Surgery:  No. Pain:  Yes. PE:  Yes. Previous DVT:  Yes. Anticoagulants:  Previously on Coumadin; now 1 aspirin per day. Other:  DUPLEX EXAM:               CFV   SFV   PopV  PTV    GSV               R  L  R  L  R  L  R   L  R  L Thrombosis    o  o  o     o     o      o Spontaneous   +  +  +     +     +      + Phasic        +  +  +     +     +      + Augmentation  +  +  +     +     +      + Compressible  +  +  +     +     +      + Competent     +  +  +     +     +      +  Legend:  + - yes  o - no  p - partial  D - decreased  IMPRESSION: 1. No evidence of deep venous thrombosis or superficial venous     thrombus in the right lower extremity. 2. Results were communicated to [facility] on 08/07/2010.   _____________________________ Larina Earthly, M.D.  LT/MEDQ  D:  08/07/2010  T:  08/07/2010  Job:  [id number]

## 2010-10-02 LAB — PROTIME-INR
INR: 1
INR: 1.1
Prothrombin Time: 13.9

## 2010-10-02 LAB — CBC
Platelets: 285
RBC: 5.11
WBC: 6.5

## 2010-10-02 LAB — URINALYSIS, ROUTINE W REFLEX MICROSCOPIC
Nitrite: NEGATIVE
Protein, ur: NEGATIVE
Specific Gravity, Urine: 1.016
Urobilinogen, UA: 1

## 2010-10-02 LAB — DIFFERENTIAL
Basophils Absolute: 0
Eosinophils Relative: 3
Lymphocytes Relative: 29
Lymphs Abs: 1.9
Monocytes Absolute: 0.5
Monocytes Relative: 8

## 2010-10-02 LAB — COMPREHENSIVE METABOLIC PANEL
AST: 30
Albumin: 3.9
Chloride: 104
Creatinine, Ser: 1.16
GFR calc Af Amer: >60
Total Bilirubin: 0.6
Total Protein: 7.1

## 2010-10-02 LAB — TSH: TSH: 2.285

## 2010-10-02 LAB — APTT: aPTT: 32

## 2010-10-05 LAB — CREATININE, SERUM: GFR calc non Af Amer: 56 — ABNORMAL LOW

## 2011-01-25 ENCOUNTER — Telehealth: Payer: Self-pay | Admitting: Family Medicine

## 2011-01-25 NOTE — Telephone Encounter (Signed)
Pt. Was prescribed nystatin (MYCOSTATIN) 100000 UNIT/ML in July but states it is not working. Would like to go back to the [employer] that he had been taking previously. Wants Dr. Clent Ridges to change prescription.

## 2011-01-26 MED ORDER — FIRST-DUKES MOUTHWASH MT SUSP: OROMUCOSAL | Status: DC

## 2011-01-26 NOTE — Telephone Encounter (Signed)
Call in Magic Mouthwash, swish 5 ml q 6 hours prn, 300 ml with 5 rf

## 2011-01-26 NOTE — Telephone Encounter (Signed)
Pt aware and rx sent to pharmacy. 

## 2011-03-16 ENCOUNTER — Ambulatory Visit (INDEPENDENT_AMBULATORY_CARE_PROVIDER_SITE_OTHER): Payer: 59 | Admitting: Family Medicine

## 2011-03-16 ENCOUNTER — Encounter: Payer: Self-pay | Admitting: Family Medicine

## 2011-03-16 VITALS — BP 130/88 | HR 82 | Temp 99.1°F | Wt 197.0 lb

## 2011-03-16 DIAGNOSIS — M546 Pain in thoracic spine: Secondary | ICD-10-CM

## 2011-03-16 DIAGNOSIS — R109 Unspecified abdominal pain: Secondary | ICD-10-CM

## 2011-03-16 MED ORDER — TEMAZEPAM 30 MG PO CAPS: 30.0000 mg | ORAL_CAPSULE | Freq: Every evening | ORAL | Status: DC | PRN

## 2011-03-16 NOTE — Progress Notes (Signed)
  Subjective:    Patient ID: Steven [name], male    DOB: 1950/03/05, 61 y.o.   MRN: [id number]  HPI Here asking about sharp LUQ and left flank pains that started 3 days ago. Today these feel a lot better. Yesterday it hurt for him to take a deep breath. No nausea or fever. BMs normal. He also mentions a week of middle back pains and spasm that is better today.    Review of Systems  Constitutional: Negative.   Respiratory: Negative.   Cardiovascular: Negative.   Gastrointestinal: Positive for abdominal pain.       Objective:   Physical Exam  Constitutional: He appears well-developed and well-nourished.  Cardiovascular: Normal rate, regular rhythm, normal heart sounds and intact distal pulses.   Pulmonary/Chest: Effort normal and breath sounds normal.  Abdominal: Soft. Bowel sounds are normal. He exhibits no distension and no mass. There is no tenderness. There is no rebound and no guarding.          Assessment & Plan:  These pains seem to be due to a pinched nerve in the middle of his back. Use heat and rest. Recheck if they get worse.

## 2011-05-10 ENCOUNTER — Encounter: Payer: Self-pay | Admitting: Family Medicine

## 2011-05-10 ENCOUNTER — Ambulatory Visit (INDEPENDENT_AMBULATORY_CARE_PROVIDER_SITE_OTHER): Payer: 59 | Admitting: Family Medicine

## 2011-05-10 VITALS — BP 122/88 | Temp 98.2°F | Wt 201.0 lb

## 2011-05-10 DIAGNOSIS — J329 Chronic sinusitis, unspecified: Secondary | ICD-10-CM

## 2011-05-10 MED ORDER — CLARITHROMYCIN 500 MG PO TABS: 500.0000 mg | ORAL_TABLET | Freq: Two times a day (BID) | ORAL | Status: AC

## 2011-05-10 NOTE — Progress Notes (Signed)
  Subjective:    Patient ID: Steven [name], male    DOB: 02/08/1950, 61 y.o.   MRN: [id number]  HPI  Acute visit. 2 week history of progressive sinus symptoms. Started off with lots of allergies. Using Allegra and Mucinex without relief. Progressive frontal sinus pressure colored nasal discharge. Intermittent headaches. No fever. Occasional chills. Occasional nonproductive cough. Symptoms have progressed over 2 weeks. History of sinusitis in the past with similar symptoms.  Past Medical History  Diagnosis Date  . Intestinal infection due to other organism, not elsewhere classified   . Personal history of venous thrombosis and embolism   . Hyperlipemia   . Personal history of urinary calculi   . Headache   . Esophageal reflux   . Hiatal hernia   . Hypertrophy (benign) of prostate   . Herpes simplex without mention of complication   . Displacement of intervertebral disc, site unspecified, without myelopathy   . Personal history of colonic polyps   . Kidney stones     sees Dr. Mena Goes   Past Surgical History  Procedure Date  . Appendectomy   . Tonsillectomy   . Lithotripsy   . Gallbladder surgery   . Finger surgery     3rd finger trigger bilateral   . Cervical disc surgery 1996 and 1998  . Esophagogastroduodenoscopy 02-24-10    per Dr. Christella Hartigan, small hiatal hernia only   . Colonoscopy 02-24-10    per Dr. Christella Hartigan, repeat in 5 yrs   . Ureteral stent placement July 2012    left ureter, per Dr. Mena Goes, for a stone     reports that he quit smoking about 38 years ago. His smoking use included Cigarettes. He has a 30 pack-year smoking history. He has never used smokeless tobacco. He reports that he does not drink alcohol or use illicit drugs. family history includes Colon cancer in his father. Allergies  Allergen Reactions  . Amitriptyline Hcl   . Cyclobenzaprine Hcl   . Glycopyrrolate   . FAO:ZHYQMVHQION+GEXBMWUXL+KGMWNUUVOZ Acid+Aspartame     REACTION: nausea  . Morphine   .  Prednisone       Review of Systems  Constitutional: Positive for chills and fatigue.  HENT: Positive for congestion and sinus pressure. Negative for sore throat.   Respiratory: Positive for cough. Negative for shortness of breath and wheezing.   Cardiovascular: Negative for chest pain.  Neurological: Positive for headaches.       Objective:   Physical Exam  Constitutional: He appears well-developed and well-nourished.  HENT:  Right Ear: External ear normal.  Left Ear: External ear normal.  Mouth/Throat: Oropharynx is clear and moist.  Neck: Neck supple.  Cardiovascular: Normal rate and regular rhythm.   Pulmonary/Chest: Effort normal and breath sounds normal. No respiratory distress. He has no wheezes. He has no rales.  Lymphadenopathy:    He has no cervical adenopathy.          Assessment & Plan:  Acute sinusitis. Biaxin 500 mg twice a day for 10 days. Continue Allegra. Follow up when necessary

## 2011-05-10 NOTE — Patient Instructions (Signed)

## 2011-08-02 ENCOUNTER — Other Ambulatory Visit (INDEPENDENT_AMBULATORY_CARE_PROVIDER_SITE_OTHER): Payer: 59

## 2011-08-02 DIAGNOSIS — Z0000 Encounter for general adult medical examination without abnormal findings: Secondary | ICD-10-CM

## 2011-08-02 LAB — PSA: PSA: 1.3 ng/mL (ref 0.10–4.00)

## 2011-08-02 LAB — POCT URINALYSIS DIPSTICK
Bilirubin, UA: NEGATIVE
Glucose, UA: NEGATIVE
Nitrite, UA: NEGATIVE
Urobilinogen, UA: 0.2

## 2011-08-02 LAB — BASIC METABOLIC PANEL
CO2: 28 mEq/L (ref 19–32)
Calcium: 9.4 mg/dL (ref 8.4–10.5)
GFR: 49.14 mL/min — ABNORMAL LOW (ref >60.00)
Glucose, Bld: 105 mg/dL — ABNORMAL HIGH (ref 70–99)
Potassium: 4.6 mEq/L (ref 3.5–5.1)
Sodium: 142 mEq/L (ref 135–145)

## 2011-08-02 LAB — CBC WITH DIFFERENTIAL/PLATELET
Basophils Absolute: 0 10*3/uL (ref 0.0–0.1)
Basophils Relative: 0.8 % (ref 0.0–3.0)
HCT: 43.5 % (ref 39.0–52.0)
Hemoglobin: 14.8 g/dL (ref 13.0–17.0)
Lymphocytes Relative: 31.4 % (ref 12.0–46.0)
Lymphs Abs: 1.8 10*3/uL (ref 0.7–4.0)
MCHC: 33.9 g/dL (ref 30.0–36.0)
Monocytes Relative: 9.3 % (ref 3.0–12.0)
Neutro Abs: 3.2 10*3/uL (ref 1.4–7.7)
RBC: 4.94 Mil/uL (ref 4.22–5.81)
RDW: 13.5 % (ref 11.5–14.6)

## 2011-08-02 LAB — LIPID PANEL
HDL: 38.4 mg/dL — ABNORMAL LOW (ref >39.00)
Total CHOL/HDL Ratio: 5

## 2011-08-02 LAB — HEPATIC FUNCTION PANEL
AST: 28 U/L (ref 0–37)
Albumin: 4.3 g/dL (ref 3.5–5.2)
Alkaline Phosphatase: 48 U/L (ref 39–117)
Total Protein: 6.8 g/dL (ref 6.0–8.3)

## 2011-08-02 LAB — TSH: TSH: 3.49 u[IU]/mL (ref 0.35–5.50)

## 2011-08-03 ENCOUNTER — Other Ambulatory Visit: Payer: 59

## 2011-08-09 ENCOUNTER — Encounter: Payer: Self-pay | Admitting: Family Medicine

## 2011-08-09 ENCOUNTER — Ambulatory Visit (INDEPENDENT_AMBULATORY_CARE_PROVIDER_SITE_OTHER): Payer: 59 | Admitting: Family Medicine

## 2011-08-09 VITALS — BP 128/88 | HR 67 | Temp 98.6°F | Ht 69.75 in | Wt 210.0 lb

## 2011-08-09 DIAGNOSIS — Z0000 Encounter for general adult medical examination without abnormal findings: Secondary | ICD-10-CM

## 2011-08-09 DIAGNOSIS — R918 Other nonspecific abnormal finding of lung field: Secondary | ICD-10-CM

## 2011-08-09 MED ORDER — VALACYCLOVIR HCL 500 MG PO TABS: 500.0000 mg | ORAL_TABLET | Freq: Two times a day (BID) | ORAL | Status: DC

## 2011-08-09 MED ORDER — EZETIMIBE 10 MG PO TABS: 10.0000 mg | ORAL_TABLET | Freq: Every day | ORAL | Status: DC

## 2011-08-09 MED ORDER — ELETRIPTAN HYDROBROMIDE 20 MG PO TABS: 20.0000 mg | ORAL_TABLET | ORAL | Status: DC | PRN

## 2011-08-09 MED ORDER — TEMAZEPAM 30 MG PO CAPS: 30.0000 mg | ORAL_CAPSULE | Freq: Every evening | ORAL | Status: DC | PRN

## 2011-08-09 MED ORDER — RABEPRAZOLE SODIUM 20 MG PO TBEC: 20.0000 mg | DELAYED_RELEASE_TABLET | Freq: Two times a day (BID) | ORAL | Status: DC

## 2011-08-09 MED ORDER — ONDANSETRON HCL 4 MG PO TABS: 4.0000 mg | ORAL_TABLET | Freq: Three times a day (TID) | ORAL | Status: DC | PRN

## 2011-08-09 MED ORDER — FENOFIBRATE 145 MG PO TABS: 145.0000 mg | ORAL_TABLET | Freq: Every day | ORAL | Status: DC

## 2011-08-09 NOTE — Progress Notes (Signed)
  Subjective:    Patient ID: Steven [name], male    DOB: Aug 12, 1950, 61 y.o.   MRN: [id number]  HPI 61 yr old male for a cpx. He feels well and has no concerns.    Review of Systems  Constitutional: Negative.   HENT: Negative.   Eyes: Negative.   Respiratory: Negative.   Cardiovascular: Negative.   Gastrointestinal: Negative.   Genitourinary: Negative.   Musculoskeletal: Negative.   Skin: Negative.   Neurological: Negative.   Hematological: Negative.   Psychiatric/Behavioral: Negative.        Objective:   Physical Exam  Constitutional: He is oriented to person, place, and time. He appears well-developed and well-nourished. No distress.  HENT:  Head: Normocephalic and atraumatic.  Right Ear: External ear normal.  Left Ear: External ear normal.  Nose: Nose normal.  Mouth/Throat: Oropharynx is clear and moist. No oropharyngeal exudate.  Eyes: Conjunctivae and EOM are normal. Pupils are equal, round, and reactive to light. Right eye exhibits no discharge. Left eye exhibits no discharge. No scleral icterus.  Neck: Neck supple. No JVD present. No tracheal deviation present. No thyromegaly present.  Cardiovascular: Normal rate, regular rhythm, normal heart sounds and intact distal pulses.  Exam reveals no gallop and no friction rub.   No murmur heard.      EKG normal   Pulmonary/Chest: Effort normal and breath sounds normal. No respiratory distress. He has no wheezes. He has no rales. He exhibits no tenderness.  Abdominal: Soft. Bowel sounds are normal. He exhibits no distension and no mass. There is no tenderness. There is no rebound and no guarding.  Genitourinary: Rectum normal, prostate normal and penis normal. Guaiac negative stool. No penile tenderness.  Musculoskeletal: Normal range of motion. He exhibits no edema and no tenderness.  Lymphadenopathy:    He has no cervical adenopathy.  Neurological: He is alert and oriented to person, place, and time. He has normal reflexes.  No cranial nerve deficit. He exhibits normal muscle tone. Coordination normal.  Skin: Skin is warm and dry. No rash noted. He is not diaphoretic. No erythema. No pallor.  Psychiatric: He has a normal mood and affect. His behavior is normal. Judgment and thought content normal.          Assessment & Plan:  Well exam. We will set up a chest CT to follow up right lung nodules from one year ago.

## 2011-08-13 ENCOUNTER — Ambulatory Visit (INDEPENDENT_AMBULATORY_CARE_PROVIDER_SITE_OTHER)
Admission: RE | Admit: 2011-08-13 | Discharge: 2011-08-13 | Disposition: A | Discharge status: Home / Self Care | Payer: 59 | Source: Ambulatory Visit | Attending: Family Medicine | Admitting: Family Medicine

## 2011-08-13 DIAGNOSIS — R918 Other nonspecific abnormal finding of lung field: Secondary | ICD-10-CM

## 2011-08-16 NOTE — Progress Notes (Signed)
Quick Note:  I left voice message with results. ______ 

## 2011-08-23 ENCOUNTER — Other Ambulatory Visit: Payer: Self-pay | Admitting: Family Medicine

## 2011-09-30 ENCOUNTER — Encounter: Payer: Self-pay | Admitting: Family Medicine

## 2011-09-30 ENCOUNTER — Ambulatory Visit (INDEPENDENT_AMBULATORY_CARE_PROVIDER_SITE_OTHER): Payer: 59 | Admitting: Family Medicine

## 2011-09-30 VITALS — BP 162/102 | HR 71 | Temp 98.5°F | Wt 198.0 lb

## 2011-09-30 DIAGNOSIS — L0390 Cellulitis, unspecified: Secondary | ICD-10-CM

## 2011-09-30 DIAGNOSIS — L0291 Cutaneous abscess, unspecified: Secondary | ICD-10-CM

## 2011-09-30 MED ORDER — DOXYCYCLINE HYCLATE 100 MG PO CAPS: 100.0000 mg | ORAL_CAPSULE | Freq: Two times a day (BID) | ORAL | Status: AC

## 2011-09-30 MED ORDER — LIDOCAINE HCL 1 % IJ SOLN
1.0000 g | Freq: Once | INTRAMUSCULAR | Status: AC
  Administered 2011-09-30: 1 g via INTRAMUSCULAR

## 2011-09-30 NOTE — Addendum Note (Signed)
Addended by: Alfred Levins D on: 09/30/2011 11:50 AM   Modules accepted: Orders

## 2011-09-30 NOTE — Progress Notes (Signed)
  Subjective:    Patient ID: Steven [name], male    DOB: 1950/09/24, 61 y.o.   MRN: [id number]  HPI Here for infection in the left thumb after he had a briar thorn stick him 5 days ago while walking through the woods. He thinks he was able to pull all the thorn fragments out, but over the past 2 days the thumb has become red, swollen, and painful. No streaks up the arm. No fever. He is soaking it in hot water with Epsom salts several times a day.   Review of Systems  Constitutional: Negative.        Objective:   Physical Exam  Constitutional: He appears well-developed and well-nourished. No distress.  Skin:       The left thumb has a small ulcerated area surrounded by swelling, erythema, and tenderness that extends to the base of the thumb. Full ROM           Assessment & Plan:  Given a Rocephin shot along with a course of Doxycycline. Continue the hot soaks. Recheck prn

## 2011-12-06 ENCOUNTER — Encounter: Payer: Self-pay | Admitting: Family Medicine

## 2011-12-06 ENCOUNTER — Ambulatory Visit (INDEPENDENT_AMBULATORY_CARE_PROVIDER_SITE_OTHER): Payer: 59 | Admitting: Family Medicine

## 2011-12-06 VITALS — BP 152/90 | HR 81 | Temp 98.9°F | Wt 196.0 lb

## 2011-12-06 DIAGNOSIS — S46819A Strain of other muscles, fascia and tendons at shoulder and upper arm level, unspecified arm, initial encounter: Secondary | ICD-10-CM

## 2011-12-06 DIAGNOSIS — S46219A Strain of muscle, fascia and tendon of other parts of biceps, unspecified arm, initial encounter: Secondary | ICD-10-CM

## 2011-12-06 NOTE — Progress Notes (Signed)
  Subjective:    Patient ID: Steven [name], male    DOB: [birth year], 61 y.o.   MRN: [id number]  HPI Here for some mild pain in the right upper arm and a lump which appeared about 10 days ago while he was loading a truck with car batteries, heavy bags of corn, etc for a hunting trip. The area has been sore ever since. Interestingly he had a steroid shot in the anterior right shoulder about 4 weeks ago per Dr. Ranell Patrick for shoulder pain.    Review of Systems  Constitutional: Negative.   Musculoskeletal: Positive for myalgias and arthralgias.       Objective:   Physical Exam  Constitutional: He appears well-developed and well-nourished.  Musculoskeletal:       There is a lump over the distal portion of the right upper arm consistent with a partially retracted biceps muscle. There is some ecchymosis here as well. Full ROM of the arm.           Assessment & Plan:  He has torn one biceps tendon. He will rest the arm, no heavy lifting. We will get him in to see Dr. Ranell Patrick soon

## 2012-01-10 HISTORY — PX: ROTATOR CUFF REPAIR: SHX139

## 2012-02-10 ENCOUNTER — Other Ambulatory Visit: Payer: Self-pay | Admitting: Family Medicine

## 2012-02-10 NOTE — Telephone Encounter (Signed)
Pt is out of RABEprazole (ACIPHEX) 20 MG tablet.  It will take 8 days to get from [facility]. Pt would like refill sent to [facility]/ [address]/ [place], [place]. [phone]) to get him through,  Pt is out today of this med and needs.

## 2012-02-11 MED ORDER — RABEPRAZOLE SODIUM 20 MG PO TBEC: 20.0000 mg | DELAYED_RELEASE_TABLET | Freq: Two times a day (BID) | ORAL | Status: DC

## 2012-02-11 NOTE — Telephone Encounter (Signed)
Pt is out of med

## 2012-02-11 NOTE — Telephone Encounter (Signed)
I sent script e-scribe and left voice message for pt 

## 2012-03-17 ENCOUNTER — Ambulatory Visit: Payer: 59 | Admitting: Gastroenterology

## 2012-03-22 ENCOUNTER — Encounter: Payer: Self-pay | Admitting: Gastroenterology

## 2012-03-22 ENCOUNTER — Ambulatory Visit (INDEPENDENT_AMBULATORY_CARE_PROVIDER_SITE_OTHER): Payer: 59 | Admitting: Gastroenterology

## 2012-03-22 VITALS — BP 140/80 | HR 62 | Ht 69.75 in | Wt 201.0 lb

## 2012-03-22 DIAGNOSIS — R1310 Dysphagia, unspecified: Secondary | ICD-10-CM

## 2012-03-22 NOTE — Patient Instructions (Addendum)
You will be set up for an upper endoscopy with possible botox injection ([facility], moderate sedation) for dysphagia.                                               We are excited to introduce MyChart, a new best-in-class service that provides you online access to important information in your electronic medical record. We want to make it easier for you to view your health information - all in one secure location - when and where you need it. We expect MyChart will enhance the quality of care and service we provide.  When you register for MyChart, you can:    View your test results.    Request appointments and receive appointment reminders via email.    Request medication renewals.    View your medical history, allergies, medications and immunizations.    Communicate with your physician's office through a password-protected site.    Conveniently print information such as your medication lists.  To find out if MyChart is right for you, please talk to a member of our clinical staff today. We will gladly answer your questions about this free health and wellness tool.  If you are age 65 or older and want a member of your family to have access to your record, you must provide written consent by completing a proxy form available at our office. Please speak to our clinical staff about guidelines regarding accounts for patients younger than age 8.  As you activate your MyChart account and need any technical assistance, please call the MyChart technical support line at [phone]) or email your question to [email or web address]. If you email your question(s), please include your name, a return phone number and the best time to reach you.  If you have non-urgent health-related questions, you can send a message to our office through MyChart at [email or web address]. If you have a medical emergency, call 911.  Thank you for using MyChart as your new health and wellness  resource!   MyChart licensed from [employer],  [id number]. Patents Pending.

## 2012-03-22 NOTE — Progress Notes (Signed)
Review of pertinent gastrointestinal problems:  1. family history of colon cancer (father). Colonoscopy February 2012 found hyperplastic polyp, recall colonoscopy at 5 year interval  2. ? Esophageal dysmotility, achalasia. Botox injection by previous gastroenterologist Magod. EGD February 2012 suggested a snug E. junction, esophageal manometry March, 2012 showed normal peristalsis but high residual lower esophageal sphincter pressure. Previoulsy was having dysphagia (around 2000 or so). Botox injection by [facility] helped, but caused worse reflux problems.   HPI: This is a  very pleasant 62 year old man whom I last saw about 2 years ago.  Still having the same left sided abdominal pains.    This is chronic, intermittent, has been thoroughly evaluated with scans and endoscopies. No clear etiology.  Had shoulder surgery 2 months ago, was on pain meds and muscle relaxors.  Swallowing has been difficult.  HAd to force vomit twice recently.  Solid food dysphagia.  OVerall stable weight.  NO overt bleeding.  Had EGD in past with EGD, botox was helpful for swallowing but created some reflux afterwards.   Past Medical History  Diagnosis Date  . Intestinal infection due to other organism, not elsewhere classified   . Personal history of venous thrombosis and embolism   . Hyperlipemia   . Personal history of urinary calculi   . Headache   . Esophageal reflux   . Hiatal hernia   . Hypertrophy (benign) of prostate   . Herpes simplex without mention of complication   . Displacement of intervertebral disc, site unspecified, without myelopathy   . Personal history of colonic polyps   . Kidney stones     sees Dr. Mena Goes    Past Surgical History  Procedure Laterality Date  . Appendectomy    . Tonsillectomy    . Lithotripsy    . Gallbladder surgery    . Finger surgery      3rd finger trigger bilateral   . Cervical disc surgery  1996 and 1998  . Esophagogastroduodenoscopy  02-24-10    per Dr.  Christella Hartigan, small hiatal hernia only   . Colonoscopy  02-24-10    per Dr. Christella Hartigan, repeat in 5 yrs   . Ureteral stent placement  July 2012    left ureter, per Dr. Mena Goes, for a stone   . Rotator cuff repair      Current Outpatient Prescriptions  Medication Sig Dispense Refill  . aspirin 81 MG tablet Take 81 mg by mouth daily.        . Diphenhyd-Hydrocort-Nystatin (FIRST-DUKES MOUTHWASH) SUSP Swish 5ml q 6 hours prn  300 mL  5  . eletriptan (RELPAX) 20 MG tablet One tablet by mouth at onset of headache. May repeat in 2 hours if headache persists or recurs.  30 tablet  3  . ezetimibe (ZETIA) 10 MG tablet Take 1 tablet (10 mg total) by mouth daily.  90 tablet  3  . fenofibrate (TRICOR) 145 MG tablet Take 1 tablet (145 mg total) by mouth daily.  90 tablet  3  . folic acid (FOLVITE) 1 MG tablet TAKE 1 TABLET DAILY  90 tablet  3  . Hyoscyamine Sulfate 0.125 MG TBDP Take by mouth. One daily as needed       . ondansetron (ZOFRAN) 4 MG tablet Take 1 tablet (4 mg total) by mouth every 8 (eight) hours as needed for nausea.  60 tablet  3  . RABEprazole (ACIPHEX) 20 MG tablet Take 1 tablet (20 mg total) by mouth 2 (two) times daily.  60 tablet  0  .  temazepam (RESTORIL) 30 MG capsule Take 30 mg by mouth at bedtime as needed.      . valACYclovir (VALTREX) 500 MG tablet Take 1 tablet (500 mg total) by mouth 2 (two) times daily. Two times daily as needed  180 tablet  3   No current facility-administered medications for this visit.    Allergies as of 03/22/2012 - Review Complete 03/22/2012  Allergen Reaction Noted  . Amitriptyline hcl    . Amoxicillin-pot clavulanate  01/18/2007  . Cyclobenzaprine hcl  07/31/2008  . Glycopyrrolate    . Morphine    . Prednisone      Family History  Problem Relation Age of Onset  . Colon cancer Father     History   Social History  . Marital Status: Married    Spouse Name: N/A    Number of Children: N/A  . Years of Education: N/A   Occupational History  .  Retired    Social History Main Topics  . Smoking status: Former Smoker -- 1.50 packs/day for 20 years    Types: Cigarettes    Quit date: 03/03/1973  . Smokeless tobacco: Never Used  . Alcohol Use: No  . Drug Use: No  . Sexually Active: Not on file   Other Topics Concern  . Not on file   Social History Narrative  . No narrative on file      Physical Exam: BP 140/80  Pulse 62  Ht 5' 9.75" (1.772 m)  Wt 201 lb (91.173 kg)  BMI 29.04 kg/m2 Constitutional: generally well-appearing Psychiatric: alert and oriented x3 Abdomen: soft, nontender, nondistended, no obvious ascites, no peritoneal signs, normal bowel sounds     Assessment and plan: 62 y.o. male with recurrent dysphagia, history of possible, probable achalasia  We'll plan on repeat EGD at his soonest convenience. Likely to Botox his GE junction. I see no reason for any further tests studies prior to then.

## 2012-03-23 ENCOUNTER — Encounter: Payer: Self-pay | Admitting: Gastroenterology

## 2012-04-04 ENCOUNTER — Encounter (HOSPITAL_COMMUNITY): Payer: Self-pay | Admitting: *Deleted

## 2012-04-12 ENCOUNTER — Encounter (HOSPITAL_COMMUNITY): Payer: Self-pay | Admitting: Pharmacy Technician

## 2012-04-13 ENCOUNTER — Encounter (HOSPITAL_COMMUNITY): Payer: Self-pay | Admitting: *Deleted

## 2012-04-13 ENCOUNTER — Ambulatory Visit (HOSPITAL_COMMUNITY)
Admission: RE | Admit: 2012-04-13 | Discharge: 2012-04-13 | Disposition: A | Discharge status: Home / Self Care | Payer: 59 | Source: Ambulatory Visit | Attending: Gastroenterology | Admitting: Gastroenterology

## 2012-04-13 ENCOUNTER — Encounter (HOSPITAL_COMMUNITY)
Admission: RE | Disposition: A | Discharge status: Home / Self Care | Payer: Self-pay | Source: Ambulatory Visit | Attending: Gastroenterology

## 2012-04-13 DIAGNOSIS — R1310 Dysphagia, unspecified: Secondary | ICD-10-CM | POA: Insufficient documentation

## 2012-04-13 DIAGNOSIS — Z7982 Long term (current) use of aspirin: Secondary | ICD-10-CM | POA: Insufficient documentation

## 2012-04-13 DIAGNOSIS — K224 Dyskinesia of esophagus: Secondary | ICD-10-CM | POA: Insufficient documentation

## 2012-04-13 DIAGNOSIS — K449 Diaphragmatic hernia without obstruction or gangrene: Secondary | ICD-10-CM | POA: Insufficient documentation

## 2012-04-13 DIAGNOSIS — Z860100 Personal history of colon polyps, unspecified: Secondary | ICD-10-CM | POA: Insufficient documentation

## 2012-04-13 DIAGNOSIS — Z79899 Other long term (current) drug therapy: Secondary | ICD-10-CM | POA: Insufficient documentation

## 2012-04-13 DIAGNOSIS — Z86718 Personal history of other venous thrombosis and embolism: Secondary | ICD-10-CM | POA: Insufficient documentation

## 2012-04-13 DIAGNOSIS — K219 Gastro-esophageal reflux disease without esophagitis: Secondary | ICD-10-CM | POA: Insufficient documentation

## 2012-04-13 DIAGNOSIS — E785 Hyperlipidemia, unspecified: Secondary | ICD-10-CM | POA: Insufficient documentation

## 2012-04-13 DIAGNOSIS — Z800 Family history of malignant neoplasm of digestive organs: Secondary | ICD-10-CM | POA: Insufficient documentation

## 2012-04-13 DIAGNOSIS — R109 Unspecified abdominal pain: Secondary | ICD-10-CM | POA: Insufficient documentation

## 2012-04-13 DIAGNOSIS — K220 Achalasia of cardia: Secondary | ICD-10-CM | POA: Insufficient documentation

## 2012-04-13 HISTORY — PX: ESOPHAGOGASTRODUODENOSCOPY (EGD) WITH ESOPHAGEAL DILATION: SHX5812

## 2012-04-13 HISTORY — PX: BOTOX INJECTION: SHX5754

## 2012-04-13 SURGERY — ESOPHAGOGASTRODUODENOSCOPY (EGD) WITH ESOPHAGEAL DILATION
Anesthesia: Moderate Sedation

## 2012-04-13 MED ORDER — SODIUM CHLORIDE 0.9 % IJ SOLN
INTRAMUSCULAR | Status: DC | PRN
  Administered 2012-04-13: 10:00:00 via SUBMUCOSAL

## 2012-04-13 MED ORDER — FENTANYL CITRATE 0.05 MG/ML IJ SOLN
INTRAMUSCULAR | Status: DC | PRN
  Administered 2012-04-13 (×3): 25 ug via INTRAVENOUS

## 2012-04-13 MED ORDER — ONABOTULINUMTOXINA 100 UNITS IJ SOLR: 100.0000 [IU] | INTRAMUSCULAR | Status: DC

## 2012-04-13 MED ORDER — BUTAMBEN-TETRACAINE-BENZOCAINE 2-2-14 % EX AERO
INHALATION_SPRAY | CUTANEOUS | Status: DC | PRN
  Administered 2012-04-13: 2 via TOPICAL

## 2012-04-13 MED ORDER — MIDAZOLAM HCL 10 MG/2ML IJ SOLN
INTRAMUSCULAR | Status: AC
  Filled 2012-04-13: qty 4

## 2012-04-13 MED ORDER — DIPHENHYDRAMINE HCL 50 MG/ML IJ SOLN
INTRAMUSCULAR | Status: AC
  Filled 2012-04-13: qty 1

## 2012-04-13 MED ORDER — SODIUM CHLORIDE 0.9 % IJ SOLN: 100.0000 [IU] | Freq: Once | INTRAMUSCULAR | Status: DC

## 2012-04-13 MED ORDER — MIDAZOLAM HCL 10 MG/2ML IJ SOLN
INTRAMUSCULAR | Status: DC | PRN
  Administered 2012-04-13 (×3): 2 mg via INTRAVENOUS

## 2012-04-13 MED ORDER — FENTANYL CITRATE 0.05 MG/ML IJ SOLN
INTRAMUSCULAR | Status: AC
  Filled 2012-04-13: qty 4

## 2012-04-13 MED ORDER — ONABOTULINUMTOXINA 100 UNITS IJ SOLR
100.0000 [IU] | INTRAMUSCULAR | Status: DC
  Filled 2012-04-13: qty 100

## 2012-04-13 MED ORDER — SODIUM CHLORIDE 0.9 % IV SOLN
INTRAVENOUS | Status: DC
  Administered 2012-04-13: 09:00:00 via INTRAVENOUS

## 2012-04-13 NOTE — Op Note (Signed)
[facility] [address] [place], [address]   ENDOSCOPY PROCEDURE REPORT  PATIENT: Steven [name], Steven [name]  MR#: [id number] BIRTHDATE: [birth year] , 61  yrs. old GENDER: Male ENDOSCOPIST: Rachael Fee, MD PROCEDURE DATE:  04/13/2012 PROCEDURE:  EGD w/ directed submucosal injection(s), any substance ASA CLASS:     Class III INDICATIONS:  ? Esophageal dysmotility, achalasia.  Botox injection by previous gastroenterologist Magod.  EGD February 2012 suggested a snug E.  junction, esophageal manometry March, 2012 showed normal peristalsis but high residual lower esophageal sphincter pressure. Previoulsy was having dysphagia (around 2000 or so).  Botox injection by [facility] helped, but caused worse reflux problems.Marland Kitchen MEDICATIONS: Fentanyl 75 mcg IV and Versed 6 mg IV TOPICAL ANESTHETIC: Cetacaine Spray  DESCRIPTION OF PROCEDURE: After the risks benefits and alternatives of the procedure were thoroughly explained, informed consent was obtained.  The Pentax Gastroscope [id number] endoscope was introduced through the mouth and advanced to the second portion of the duodenum. Without limitations.  The instrument was slowly withdrawn as the mucosa was fully examined.  The GE junction was intermittently very snug and appeared Achalasia-like (smoothly narrowed at GE junction).  There were also observed times of more normal appearing GE junction.  Given his clear dysphagia good response to Botox injection in the past with Dr.  Ewing Schlein, I injected 25units of Botox in 4 quadrants of GE junction.  The examination was otherwise normal.  Retroflexed views revealed no abnormalities.     The scope was then withdrawn from the patient and the procedure completed. COMPLICATIONS: There were no complications.  ENDOSCOPIC IMPRESSION: The GE junction was intermittently very snug and appeared Achalasia-like (smoothly narrowed at GE junction).  There were also observed times of more normal appearing GE  junction.  Given his clear dysphagia good response to Botox injection in the past with Dr.  Ewing Schlein, I injected 25units of Botox in 4 quadrants of GE junction.  The examination was otherwise normal.  RECOMMENDATIONS: My office will contact you about follow up appt in 4-5 weeks to assess your response to this Botox injection.  If clear, signficant improvement in swallowing I will likely refer you to general surgeon to consider Heller myotomy which is a surgery that can more permanently improve your swallowing.   eSigned:  Rachael Fee, MD 04/13/2012 10:35 AM

## 2012-04-13 NOTE — Interval H&P Note (Signed)
History and Physical Interval Note:  04/13/2012 9:59 AM  Steven [name]  has presented today for surgery, with the diagnosis of Dysphagia [787.20]  The various methods of treatment have been discussed with the patient and family. After consideration of risks, benefits and other options for treatment, the patient has consented to  Procedure(s) with comments: ESOPHAGOGASTRODUODENOSCOPY (EGD) WITH ESOPHAGEAL DILATION (N/A) - possible botox injection BOTOX INJECTION (N/A) as a surgical intervention .  The patient's history has been reviewed, patient examined, no change in status, stable for surgery.  I have reviewed the patient's chart and labs.  Questions were answered to the patient's satisfaction.     Rob Bunting

## 2012-04-13 NOTE — H&P (View-Only) (Signed)
Review of pertinent gastrointestinal problems:  1. family history of colon cancer (father). Colonoscopy February 2012 found hyperplastic polyp, recall colonoscopy at 5 year interval  2. ? Esophageal dysmotility, achalasia. Botox injection by previous gastroenterologist Magod. EGD February 2012 suggested a snug E. junction, esophageal manometry March, 2012 showed normal peristalsis but high residual lower esophageal sphincter pressure. Previoulsy was having dysphagia (around 2000 or so). Botox injection by [facility] helped, but caused worse reflux problems.   HPI: This is a  very pleasant 62 year old man whom I last saw about 2 years ago.  Still having the same left sided abdominal pains.    This is chronic, intermittent, has been thoroughly evaluated with scans and endoscopies. No clear etiology.  Had shoulder surgery 2 months ago, was on pain meds and muscle relaxors.  Swallowing has been difficult.  HAd to force vomit twice recently.  Solid food dysphagia.  OVerall stable weight.  NO overt bleeding.  Had EGD in past with EGD, botox was helpful for swallowing but created some reflux afterwards.   Past Medical History  Diagnosis Date  . Intestinal infection due to other organism, not elsewhere classified   . Personal history of venous thrombosis and embolism   . Hyperlipemia   . Personal history of urinary calculi   . Headache   . Esophageal reflux   . Hiatal hernia   . Hypertrophy (benign) of prostate   . Herpes simplex without mention of complication   . Displacement of intervertebral disc, site unspecified, without myelopathy   . Personal history of colonic polyps   . Kidney stones     sees Dr. Mena Goes    Past Surgical History  Procedure Laterality Date  . Appendectomy    . Tonsillectomy    . Lithotripsy    . Gallbladder surgery    . Finger surgery      3rd finger trigger bilateral   . Cervical disc surgery  1996 and 1998  . Esophagogastroduodenoscopy  02-24-10    per Dr.  Christella Hartigan, small hiatal hernia only   . Colonoscopy  02-24-10    per Dr. Christella Hartigan, repeat in 5 yrs   . Ureteral stent placement  July 2012    left ureter, per Dr. Mena Goes, for a stone   . Rotator cuff repair      Current Outpatient Prescriptions  Medication Sig Dispense Refill  . aspirin 81 MG tablet Take 81 mg by mouth daily.        . Diphenhyd-Hydrocort-Nystatin (FIRST-DUKES MOUTHWASH) SUSP Swish 5ml q 6 hours prn  300 mL  5  . eletriptan (RELPAX) 20 MG tablet One tablet by mouth at onset of headache. May repeat in 2 hours if headache persists or recurs.  30 tablet  3  . ezetimibe (ZETIA) 10 MG tablet Take 1 tablet (10 mg total) by mouth daily.  90 tablet  3  . fenofibrate (TRICOR) 145 MG tablet Take 1 tablet (145 mg total) by mouth daily.  90 tablet  3  . folic acid (FOLVITE) 1 MG tablet TAKE 1 TABLET DAILY  90 tablet  3  . Hyoscyamine Sulfate 0.125 MG TBDP Take by mouth. One daily as needed       . ondansetron (ZOFRAN) 4 MG tablet Take 1 tablet (4 mg total) by mouth every 8 (eight) hours as needed for nausea.  60 tablet  3  . RABEprazole (ACIPHEX) 20 MG tablet Take 1 tablet (20 mg total) by mouth 2 (two) times daily.  60 tablet  0  .  temazepam (RESTORIL) 30 MG capsule Take 30 mg by mouth at bedtime as needed.      . valACYclovir (VALTREX) 500 MG tablet Take 1 tablet (500 mg total) by mouth 2 (two) times daily. Two times daily as needed  180 tablet  3   No current facility-administered medications for this visit.    Allergies as of 03/22/2012 - Review Complete 03/22/2012  Allergen Reaction Noted  . Amitriptyline hcl    . Amoxicillin-pot clavulanate  01/18/2007  . Cyclobenzaprine hcl  07/31/2008  . Glycopyrrolate    . Morphine    . Prednisone      Family History  Problem Relation Age of Onset  . Colon cancer Father     History   Social History  . Marital Status: Married    Spouse Name: N/A    Number of Children: N/A  . Years of Education: N/A   Occupational History  .  Retired    Social History Main Topics  . Smoking status: Former Smoker -- 1.50 packs/day for 20 years    Types: Cigarettes    Quit date: 03/03/1973  . Smokeless tobacco: Never Used  . Alcohol Use: No  . Drug Use: No  . Sexually Active: Not on file   Other Topics Concern  . Not on file   Social History Narrative  . No narrative on file      Physical Exam: BP 140/80  Pulse 62  Ht 5' 9.75" (1.772 m)  Wt 201 lb (91.173 kg)  BMI 29.04 kg/m2 Constitutional: generally well-appearing Psychiatric: alert and oriented x3 Abdomen: soft, nontender, nondistended, no obvious ascites, no peritoneal signs, normal bowel sounds     Assessment and plan: 62 y.o. male with recurrent dysphagia, history of possible, probable achalasia  We'll plan on repeat EGD at his soonest convenience. Likely to Botox his GE junction. I see no reason for any further tests studies prior to then.

## 2012-04-14 ENCOUNTER — Encounter (HOSPITAL_COMMUNITY): Payer: Self-pay | Admitting: Gastroenterology

## 2012-04-17 ENCOUNTER — Encounter: Payer: Self-pay | Admitting: Family Medicine

## 2012-04-17 ENCOUNTER — Ambulatory Visit (INDEPENDENT_AMBULATORY_CARE_PROVIDER_SITE_OTHER): Payer: 59 | Admitting: Family Medicine

## 2012-04-17 VITALS — BP 138/86 | HR 83 | Temp 99.6°F | Wt 200.0 lb

## 2012-04-17 DIAGNOSIS — J209 Acute bronchitis, unspecified: Secondary | ICD-10-CM

## 2012-04-17 MED ORDER — CEFTRIAXONE SODIUM 1 G IJ SOLR
1.0000 g | Freq: Once | INTRAMUSCULAR | Status: AC
  Administered 2012-04-17: 1 g via INTRAMUSCULAR

## 2012-04-17 MED ORDER — CLARITHROMYCIN 500 MG PO TABS: 500.0000 mg | ORAL_TABLET | Freq: Two times a day (BID) | ORAL | Status: DC

## 2012-04-17 MED ORDER — HYDROCODONE-HOMATROPINE 5-1.5 MG/5ML PO SYRP: 5.0000 mL | ORAL_SOLUTION | ORAL | Status: DC | PRN

## 2012-04-17 NOTE — Progress Notes (Signed)
  Subjective:    Patient ID: Steven [name], male    DOB: 1950/12/28, 62 y.o.   MRN: [id number]  HPI Here for 4 days of sinus pressure, PND, chest burning, and coughing up green sputum. Some fever. On Advil and Mucinex.    Review of Systems  Constitutional: Positive for fever.  HENT: Positive for postnasal drip.   Eyes: Negative.   Respiratory: Positive for cough and wheezing.        Objective:   Physical Exam  Constitutional: He appears well-developed and well-nourished.  HENT:  Right Ear: External ear normal.  Left Ear: External ear normal.  Nose: Nose normal.  Mouth/Throat: Oropharynx is clear and moist.  Eyes: Conjunctivae are normal.  Pulmonary/Chest: Effort normal. No respiratory distress. He has no wheezes. He has no rales.  Rhonchi   Lymphadenopathy:    He has no cervical adenopathy.          Assessment & Plan:  Given a Rocephin shot.

## 2012-04-17 NOTE — Addendum Note (Signed)
Addended by: Aniceto Boss A on: 04/17/2012 12:25 PM   Modules accepted: Orders

## 2012-05-10 ENCOUNTER — Ambulatory Visit (INDEPENDENT_AMBULATORY_CARE_PROVIDER_SITE_OTHER): Payer: 59 | Admitting: Family Medicine

## 2012-05-10 ENCOUNTER — Encounter: Payer: Self-pay | Admitting: Family Medicine

## 2012-05-10 VITALS — BP 154/100 | HR 68 | Temp 98.1°F | Wt 198.0 lb

## 2012-05-10 DIAGNOSIS — I10 Essential (primary) hypertension: Secondary | ICD-10-CM

## 2012-05-10 HISTORY — DX: Essential (primary) hypertension: I10

## 2012-05-10 LAB — BASIC METABOLIC PANEL
BUN: 15 mg/dL (ref 6–23)
CO2: 33 mEq/L — ABNORMAL HIGH (ref 19–32)
Chloride: 102 mEq/L (ref 96–112)
Creatinine, Ser: 1.3 mg/dL (ref 0.4–1.5)

## 2012-05-10 LAB — TSH: TSH: 2.16 u[IU]/mL (ref 0.35–5.50)

## 2012-05-10 MED ORDER — LISINOPRIL-HYDROCHLOROTHIAZIDE 10-12.5 MG PO TABS: 1.0000 | ORAL_TABLET | Freq: Every day | ORAL | Status: DC

## 2012-05-10 NOTE — Progress Notes (Signed)
  Subjective:    Patient ID: Steven [name], male    DOB: 1950/08/28, 62 y.o.   MRN: [id number]  HPI Here for 3 days of feeling weak and lightheaded and for elevated BPs. He has never had high BP before, but he has a strong family hx with HTN in his parents and all his siblings. His wife has a cuff at home, and his BP has run from the 150s to the 180s systolic. No chest pain or SOB. He uses very little salt in the diet and no tobacco.    Review of Systems  Constitutional: Positive for fatigue.  Respiratory: Negative.   Cardiovascular: Negative.   Neurological: Positive for light-headedness. Negative for dizziness and headaches.       Objective:   Physical Exam  Constitutional: He is oriented to person, place, and time. He appears well-developed and well-nourished. No distress.  Neck: No thyromegaly present.  Cardiovascular: Normal rate, regular rhythm, normal heart sounds and intact distal pulses.   Pulmonary/Chest: Effort normal and breath sounds normal.  Lymphadenopathy:    He has no cervical adenopathy.  Neurological: He is alert and oriented to person, place, and time.          Assessment & Plan:  New onset HTN. Get a BMET and TSH today. Start on Lisinopril HCT daily. Recheck in 2 weeks.

## 2012-05-11 NOTE — Progress Notes (Signed)
Quick Note:  I spoke with pt ______ 

## 2012-05-19 ENCOUNTER — Encounter: Payer: Self-pay | Admitting: Gastroenterology

## 2012-05-19 ENCOUNTER — Ambulatory Visit (INDEPENDENT_AMBULATORY_CARE_PROVIDER_SITE_OTHER): Payer: 59 | Admitting: Gastroenterology

## 2012-05-19 VITALS — BP 110/70 | HR 72 | Ht 69.25 in | Wt 198.4 lb

## 2012-05-19 DIAGNOSIS — K220 Achalasia of cardia: Secondary | ICD-10-CM

## 2012-05-19 NOTE — Patient Instructions (Addendum)
You almost certainly have achalasia. If you interested in hearing about surgery referral for Heller myotomy then please call Dr. Christella Hartigan. If your swallowing difficulty returns, please call Dr. Christella Hartigan. If your left sided pains signficantly worsen, please call.

## 2012-05-19 NOTE — Progress Notes (Signed)
Review of pertinent gastrointestinal problems:  1. family history of colon cancer (father). Colonoscopy February 2012 found hyperplastic polyp, recall colonoscopy at 5 year interval  2. Esophageal dysmotility, achalasia. Botox injection by previous gastroenterologist Magod. EGD February 2012 suggested a snug E. junction, esophageal manometry March, 2012 showed normal peristalsis but high residual lower esophageal sphincter pressure. Previoulsy was having dysphagia (around 2000 or so). Botox injection by [facility] helped, but caused worse reflux problems.  EGD 4.2014 The GE junction was intermittently very snug and appeared Achalasia-like (smoothly narrowed at GE junction). There were also observed times of more normal appearing GE junction. Given his clear dysphagia good response to Botox injection in the past with Dr. Ewing Schlein, I injected 25units of Botox in 4 quadrants of GE junction. The examination was otherwise normal.  May 2014: Clear, significant improvement again after Botox injection last month  HPI: This is a  very pleasant 62 year old man who is here with his wife today.  I last saw him about 4 weeks ago at the time of EGD with Botox injection of the GE junction.  Has noticed a clear improvement in swallowing since botox   He asked again about his chronic, 10-15 years of intermittent left-sided abdominal pains.   Past Medical History  Diagnosis Date  . Intestinal infection due to other organism, not elsewhere classified   . Personal history of venous thrombosis and embolism   . Hyperlipemia   . Personal history of urinary calculi   . Esophageal reflux   . Hypertrophy (benign) of prostate   . Herpes simplex without mention of complication   . Displacement of intervertebral disc, site unspecified, without myelopathy   . Personal history of colonic polyps   . Hiatal hernia   . Kidney stones     sees Dr. Mena Goes  . Headache     sinus and migraines  . HTN (hypertension) 05/10/2012     Past Surgical History  Procedure Laterality Date  . Appendectomy    . Tonsillectomy    . Lithotripsy    . Gallbladder surgery    . Finger surgery      3rd finger trigger bilateral   . Cervical disc surgery  1996 and 1998  . Esophagogastroduodenoscopy  02-24-10    per Dr. Christella Hartigan, small hiatal hernia only   . Colonoscopy  02-24-10    per Dr. Christella Hartigan, repeat in 5 yrs   . Ureteral stent placement  July 2012    left ureter, per Dr. Mena Goes, for a stone   . Rotator cuff repair Right 01-10-12    per Dr. Malon Kindle, also repair torn biceps tendon   . Cholecystectomy    . Esophagogastroduodenoscopy (egd) with esophageal dilation N/A 04/13/2012    Procedure: ESOPHAGOGASTRODUODENOSCOPY (EGD) WITH ESOPHAGEAL DILATION;  Surgeon: Rachael Fee, MD;  Location: [facility];  Service: Endoscopy;  Laterality: N/A;  possible botox injection  . Botox injection N/A 04/13/2012    Procedure: BOTOX INJECTION;  Surgeon: Rachael Fee, MD;  Location: [facility];  Service: Endoscopy;  Laterality: N/A;    Current Outpatient Prescriptions  Medication Sig Dispense Refill  . aspirin 81 MG tablet Take 81 mg by mouth daily.        . Diphenhyd-Hydrocort-Nystatin (FIRST-DUKES MOUTHWASH) SUSP Swish 5ml q 6 hours prn  300 mL  5  . eletriptan (RELPAX) 20 MG tablet One tablet by mouth at onset of headache. May repeat in 2 hours if headache persists or recurs.  30 tablet  3  .  ezetimibe (ZETIA) 10 MG tablet Take 10 mg by mouth every evening.      . fenofibrate (TRICOR) 145 MG tablet Take 145 mg by mouth every evening.      . folic acid (FOLVITE) 1 MG tablet TAKE 1 TABLET DAILY  90 tablet  3  . lisinopril-hydrochlorothiazide (PRINZIDE,ZESTORETIC) 10-12.5 MG per tablet Take 1 tablet by mouth daily.  30 tablet  2  . Multiple Vitamin (MULTIVITAMIN WITH MINERALS) TABS Take 1 tablet by mouth daily.      . ondansetron (ZOFRAN) 4 MG tablet Take 1 tablet (4 mg total) by mouth every 8 (eight) hours as needed for nausea.   60 tablet  3  . oxyCODONE-acetaminophen (PERCOCET/ROXICET) 5-325 MG per tablet Take 1-2 tablets by mouth every 4 (four) hours as needed for pain.      . RABEprazole (ACIPHEX) 20 MG tablet Take 1 tablet (20 mg total) by mouth 2 (two) times daily.  60 tablet  0  . temazepam (RESTORIL) 30 MG capsule Take 30 mg by mouth at bedtime as needed for sleep or anxiety.       . valACYclovir (VALTREX) 500 MG tablet Take 500 mg by mouth 2 (two) times daily.       No current facility-administered medications for this visit.    Allergies as of 05/19/2012 - Review Complete 05/19/2012  Allergen Reaction Noted  . Amitriptyline hcl    . Amoxicillin-pot clavulanate  01/18/2007  . Cyclobenzaprine hcl  07/31/2008  . Glycopyrrolate    . Morphine    . Prednisone Other (See Comments)     Family History  Problem Relation Age of Onset  . Colon cancer Father     History   Social History  . Marital Status: Married    Spouse Name: N/A    Number of Children: N/A  . Years of Education: N/A   Occupational History  . Retired    Social History Main Topics  . Smoking status: Former Smoker -- 1.50 packs/day for 20 years    Types: Cigarettes    Quit date: 03/03/1973  . Smokeless tobacco: Never Used  . Alcohol Use: No  . Drug Use: No  . Sexually Active: Not on file   Other Topics Concern  . Not on file   Social History Narrative  . No narrative on file      Physical Exam: BP 110/70  Pulse 72  Ht 5' 9.25" (1.759 m)  Wt 198 lb 6 oz (89.982 kg)  BMI 29.08 kg/m2 Constitutional: generally well-appearing Psychiatric: alert and oriented x3 Abdomen: soft, nontender, nondistended, no obvious ascites, no peritoneal signs, normal bowel sounds     Assessment and plan: 62 y.o. male with achalasia  I offered general surgery referral to consider Heller myotomy for achalasia. He wants to think about that for now. I think given his significant symptom response and his previous esophageal manometry he  truly does have achalasia. He recalled his swallowing worsens or if he reconsiders general surgery referral.,

## 2012-05-24 ENCOUNTER — Ambulatory Visit (INDEPENDENT_AMBULATORY_CARE_PROVIDER_SITE_OTHER): Payer: 59 | Admitting: Family Medicine

## 2012-05-24 ENCOUNTER — Encounter: Payer: Self-pay | Admitting: Family Medicine

## 2012-05-24 VITALS — BP 120/80 | HR 87 | Temp 98.5°F | Wt 200.0 lb

## 2012-05-24 DIAGNOSIS — I10 Essential (primary) hypertension: Secondary | ICD-10-CM

## 2012-05-24 MED ORDER — AMLODIPINE BESYLATE 5 MG PO TABS: 5.0000 mg | ORAL_TABLET | Freq: Every day | ORAL | Status: DC

## 2012-05-24 NOTE — Progress Notes (Signed)
  Subjective:    Patient ID: Steven [name], male    DOB: 10/10/1950, 62 y.o.   MRN: [id number]  HPI Here to recheck his BP, and it has responded well to the lisinopril HCT. His BP at home has been in the 120s over 80s. However he says that it makes him feel depressed and tired. He has lost any motivation to do things, which is unusual for him. The lightheadedness he was having from the high BPs has resolved.    Review of Systems  Constitutional: Positive for fatigue.  Respiratory: Negative.   Cardiovascular: Negative.   Neurological: Negative.   Psychiatric/Behavioral: Positive for dysphoric mood.       Objective:   Physical Exam  Constitutional: He is oriented to person, place, and time. He appears well-developed and well-nourished.  Cardiovascular: Normal rate, regular rhythm, normal heart sounds and intact distal pulses.   Pulmonary/Chest: Effort normal and breath sounds normal.  Neurological: He is alert and oriented to person, place, and time.  Psychiatric: He has a normal mood and affect. His behavior is normal. Thought content normal.          Assessment & Plan:  His HTN is controlled but he seems to be having side effects from the lisinopril hct. Stop this and start Amlodipine 5 mg daily. Recheck one month.

## 2012-06-30 ENCOUNTER — Ambulatory Visit (INDEPENDENT_AMBULATORY_CARE_PROVIDER_SITE_OTHER): Payer: 59 | Admitting: Family Medicine

## 2012-06-30 ENCOUNTER — Encounter: Payer: Self-pay | Admitting: Family Medicine

## 2012-06-30 VITALS — BP 126/80 | HR 68 | Temp 98.1°F | Wt 195.0 lb

## 2012-06-30 DIAGNOSIS — R51 Headache: Secondary | ICD-10-CM

## 2012-06-30 NOTE — Progress Notes (Signed)
  Subjective:    Patient ID: Steven [name], male    DOB: 10/18/1950, 62 y.o.   MRN: [id number]  HPI Here for 5 days of intermittent sharp fleeting pains in the right posterior neck, the right back of the head and the right forehead. No pain in the temple. No blurred vision or any neurologic deficits. This started when he was remodeling his deck. He has tried heat to the neck with mixed results.    Review of Systems  Constitutional: Negative.   HENT: Positive for neck pain and neck stiffness.   Eyes: Negative.   Neurological: Positive for headaches. Negative for dizziness, tremors, seizures, syncope, facial asymmetry, speech difficulty, weakness, light-headedness and numbness.       Objective:   Physical Exam  Constitutional: He is oriented to person, place, and time. He appears well-developed and well-nourished.  HENT:  Head: Normocephalic and atraumatic.  Eyes: Conjunctivae and EOM are normal. Pupils are equal, round, and reactive to light.  Neck: Normal range of motion. Neck supple.  Mildly tender at the right skull base   Lymphadenopathy:    He has no cervical adenopathy.  Neurological: He is alert and oriented to person, place, and time. He has normal reflexes. No cranial nerve deficit. He exhibits normal muscle tone. Coordination normal.          Assessment & Plan:  This seems to be an irritated occipital nerve. Try ice to the neck, use Aleve bid, rest. Recheck prn

## 2012-08-17 ENCOUNTER — Other Ambulatory Visit: Payer: Self-pay | Admitting: Family Medicine

## 2012-08-17 NOTE — Telephone Encounter (Signed)
Request is for a mail order, can we refill these?

## 2012-08-17 NOTE — Telephone Encounter (Signed)
Okay for all three for one year

## 2012-08-27 ENCOUNTER — Other Ambulatory Visit: Payer: Self-pay | Admitting: Family Medicine

## 2012-08-28 ENCOUNTER — Other Ambulatory Visit (INDEPENDENT_AMBULATORY_CARE_PROVIDER_SITE_OTHER): Payer: 59

## 2012-08-28 DIAGNOSIS — Z0000 Encounter for general adult medical examination without abnormal findings: Secondary | ICD-10-CM

## 2012-08-28 LAB — POCT URINALYSIS DIPSTICK
Bilirubin, UA: NEGATIVE
Blood, UA: NEGATIVE
Glucose, UA: NEGATIVE
Ketones, UA: NEGATIVE
Leukocytes, UA: NEGATIVE
Nitrite, UA: NEGATIVE
Protein, UA: NEGATIVE
Spec Grav, UA: 1.02
Urobilinogen, UA: 0.2
pH, UA: 7

## 2012-08-28 LAB — HEPATIC FUNCTION PANEL
Albumin: 4.1 g/dL (ref 3.5–5.2)
Alkaline Phosphatase: 46 U/L (ref 39–117)
Bilirubin, Direct: 0.1 mg/dL (ref 0.0–0.3)

## 2012-08-28 LAB — CBC WITH DIFFERENTIAL/PLATELET
Basophils Absolute: 0 10*3/uL (ref 0.0–0.1)
Basophils Relative: 0.5 % (ref 0.0–3.0)
Eosinophils Absolute: 0.2 10*3/uL (ref 0.0–0.7)
Eosinophils Relative: 3.2 % (ref 0.0–5.0)
HCT: 42.3 % (ref 39.0–52.0)
Hemoglobin: 14.6 g/dL (ref 13.0–17.0)
Lymphocytes Relative: 30.2 % (ref 12.0–46.0)
Lymphs Abs: 1.5 10*3/uL (ref 0.7–4.0)
MCHC: 34.5 g/dL (ref 30.0–36.0)
MCV: 85.9 fl (ref 78.0–100.0)
Monocytes Absolute: 0.4 10*3/uL (ref 0.1–1.0)
Monocytes Relative: 8.4 % (ref 3.0–12.0)
Neutro Abs: 2.8 10*3/uL (ref 1.4–7.7)
Neutrophils Relative %: 57.7 % (ref 43.0–77.0)
Platelets: 236 10*3/uL (ref 150.0–400.0)
RBC: 4.93 Mil/uL (ref 4.22–5.81)
RDW: 13.4 % (ref 11.5–14.6)
WBC: 4.9 10*3/uL (ref 4.5–10.5)

## 2012-08-28 LAB — LIPID PANEL
Cholesterol: 203 mg/dL — ABNORMAL HIGH (ref 0–200)
HDL: 38.4 mg/dL — ABNORMAL LOW
Total CHOL/HDL Ratio: 5
Triglycerides: 167 mg/dL — ABNORMAL HIGH (ref 0.0–149.0)
VLDL: 33.4 mg/dL (ref 0.0–40.0)

## 2012-08-28 LAB — LDL CHOLESTEROL, DIRECT: Direct LDL: 143.6 mg/dL

## 2012-08-28 LAB — BASIC METABOLIC PANEL
CO2: 29 mEq/L (ref 19–32)
Calcium: 9.2 mg/dL (ref 8.4–10.5)
Creatinine, Ser: 1.3 mg/dL (ref 0.4–1.5)
Glucose, Bld: 99 mg/dL (ref 70–99)

## 2012-08-28 LAB — TSH: TSH: 2.02 u[IU]/mL (ref 0.35–5.50)

## 2012-08-28 LAB — PSA: PSA: 0.62 ng/mL (ref 0.10–4.00)

## 2012-09-05 ENCOUNTER — Encounter: Payer: Self-pay | Admitting: Family Medicine

## 2012-09-05 ENCOUNTER — Ambulatory Visit (INDEPENDENT_AMBULATORY_CARE_PROVIDER_SITE_OTHER): Payer: 59 | Admitting: Family Medicine

## 2012-09-05 VITALS — BP 138/84 | HR 75 | Temp 98.5°F | Ht 70.25 in | Wt 196.0 lb

## 2012-09-05 DIAGNOSIS — Z0000 Encounter for general adult medical examination without abnormal findings: Secondary | ICD-10-CM

## 2012-09-05 MED ORDER — AMLODIPINE BESYLATE 5 MG PO TABS: 5.0000 mg | ORAL_TABLET | Freq: Every day | ORAL | Status: DC

## 2012-09-05 MED ORDER — CYCLOBENZAPRINE HCL 10 MG PO TABS: 10.0000 mg | ORAL_TABLET | Freq: Three times a day (TID) | ORAL | Status: DC | PRN

## 2012-09-05 NOTE — Progress Notes (Signed)
  Subjective:    Patient ID: Steven [name], male    DOB: August 10, 1950, 63 y.o.   MRN: [id number]  HPI 62 yr old male for a cpx. He has been well until his low back pain started acting up a few months ago. He saw Dr. Shelle Iron about 6 weeks ago and got an epidural steroid shot. This did not help much. Then this am when he got out of bed he developed low back spasms that have continued all morning. He has Dilaudid at home to take but he did not take any yet today. He using heat.    Review of Systems  Constitutional: Negative.   HENT: Negative.   Eyes: Negative.   Respiratory: Negative.   Cardiovascular: Negative.   Gastrointestinal: Negative.   Genitourinary: Negative.   Musculoskeletal: Positive for back pain.  Skin: Negative.   Neurological: Negative.   Psychiatric/Behavioral: Negative.        Objective:   Physical Exam  Constitutional: He is oriented to person, place, and time. He appears well-developed and well-nourished.  In pain, walks very slowly   HENT:  Head: Normocephalic and atraumatic.  Right Ear: External ear normal.  Left Ear: External ear normal.  Nose: Nose normal.  Mouth/Throat: Oropharynx is clear and moist. No oropharyngeal exudate.  Eyes: Conjunctivae and EOM are normal. Pupils are equal, round, and reactive to light. Right eye exhibits no discharge. Left eye exhibits no discharge. No scleral icterus.  Neck: Neck supple. No JVD present. No tracheal deviation present. No thyromegaly present.  Cardiovascular: Normal rate, regular rhythm, normal heart sounds and intact distal pulses.  Exam reveals no gallop and no friction rub.   No murmur heard. EKG normal  Pulmonary/Chest: Effort normal and breath sounds normal. No respiratory distress. He has no wheezes. He has no rales. He exhibits no tenderness.  Abdominal: Soft. Bowel sounds are normal. He exhibits no distension and no mass. There is no tenderness. There is no rebound and no guarding.  Genitourinary: Rectum  normal, prostate normal and penis normal. Guaiac negative stool. No penile tenderness.  Musculoskeletal: He exhibits no edema.  The lower back has a lot of spasm and is tender   Lymphadenopathy:    He has no cervical adenopathy.  Neurological: He is alert and oriented to person, place, and time. He has normal reflexes. No cranial nerve deficit. He exhibits normal muscle tone. Coordination normal.  Skin: Skin is warm and dry. No rash noted. No erythema. No pallor.  Psychiatric: He has a normal mood and affect. His behavior is normal. Judgment and thought content normal.          Assessment & Plan:  Well exam. Given some Flexeril for the back spasms. He will follow up with Dr. Shelle Iron

## 2012-10-26 ENCOUNTER — Other Ambulatory Visit: Payer: Self-pay | Admitting: Family Medicine

## 2012-11-20 ENCOUNTER — Telehealth: Payer: Self-pay | Admitting: Family Medicine

## 2012-11-20 NOTE — Telephone Encounter (Signed)
Pt needs refill of eletriptan (RELPAX) 20 MG tablet  90 day w/ refills [facility]

## 2012-11-22 MED ORDER — ELETRIPTAN HYDROBROMIDE 20 MG PO TABS: 20.0000 mg | ORAL_TABLET | ORAL | Status: DC | PRN

## 2012-11-22 NOTE — Telephone Encounter (Signed)
I sent script e-scribe. 

## 2012-11-27 IMAGING — CT CT ANGIO CHEST
2 of 6 series · 19 of 36 positions shown · IV contrast (Omnipaque 300)
Comparison: 10/10/2007

CLINICAL DATA: Shortness of breath and chest pain.

CT ANGIOGRAPHY CHEST WITH CONTRAST
TECHNIQUE: Multidetector CT imaging of the chest was performed
using the standard protocol during bolus administration of
intravenous contrast.  Multiplanar CT image reconstructions
including MIPs were obtained to evaluate the vascular anatomy.
Contrast:  80 ml 3mnipaque-L55

[Series 5: thins (id) / (id) · axial · 0.75mm/px · z∈[+21,+246]mm · 18 of 251 slices shown]
[im 13/251  lung]
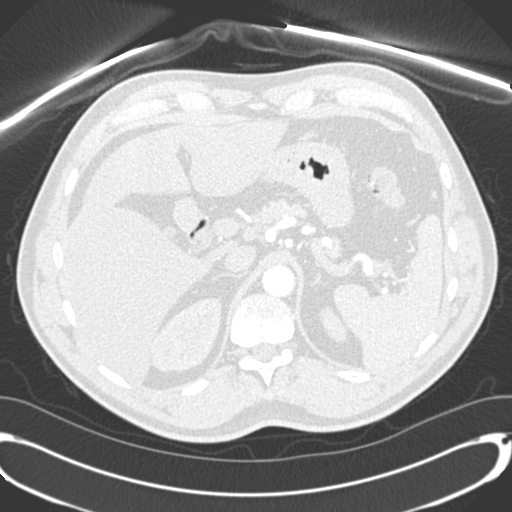
[im 26/251  mediastinal]
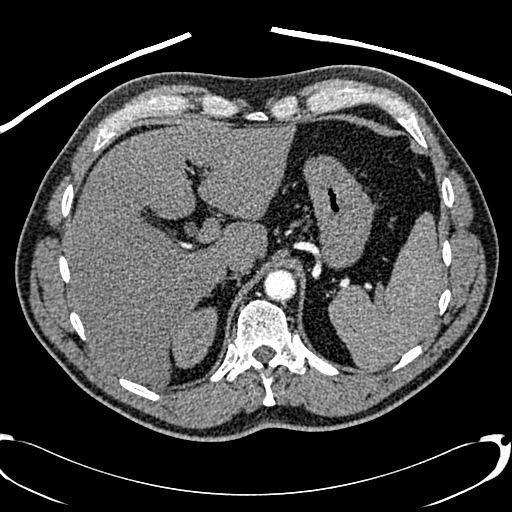
[im 38/251  lung]
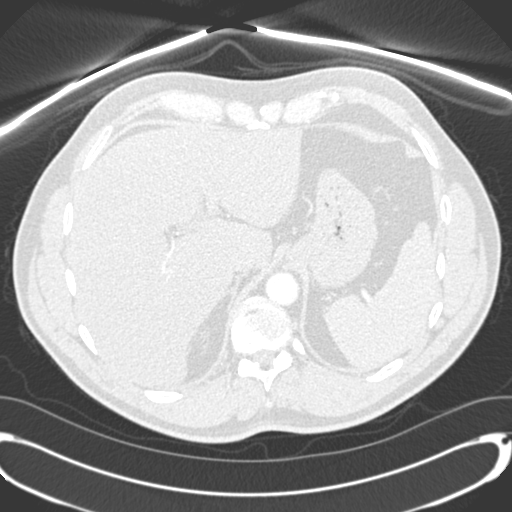
[im 51/251  mediastinal]
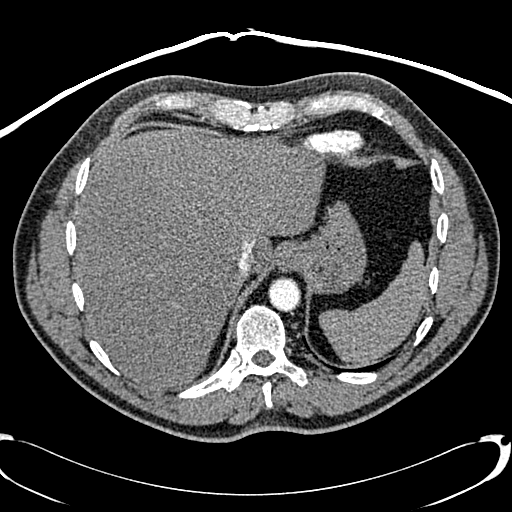
[im 63/251  lung]
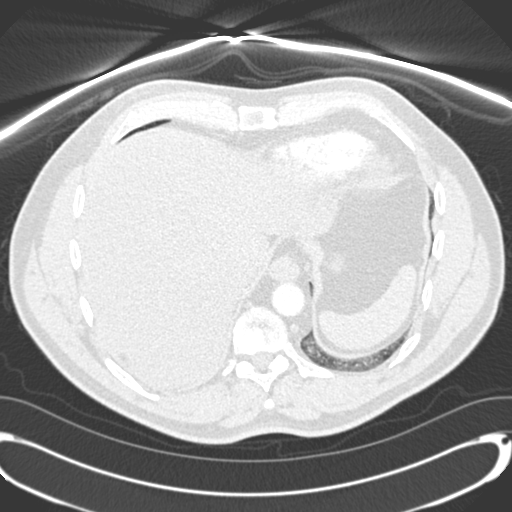
[im 76/251  mediastinal]
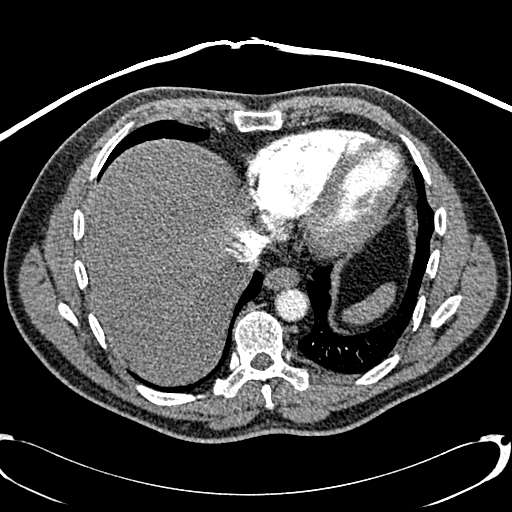
[im 88/251  lung]
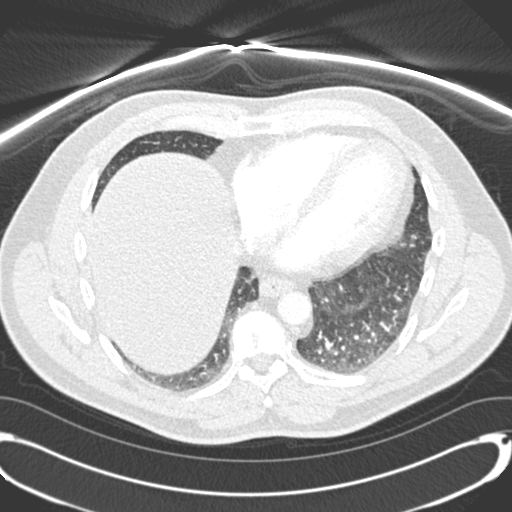
[im 101/251  mediastinal]
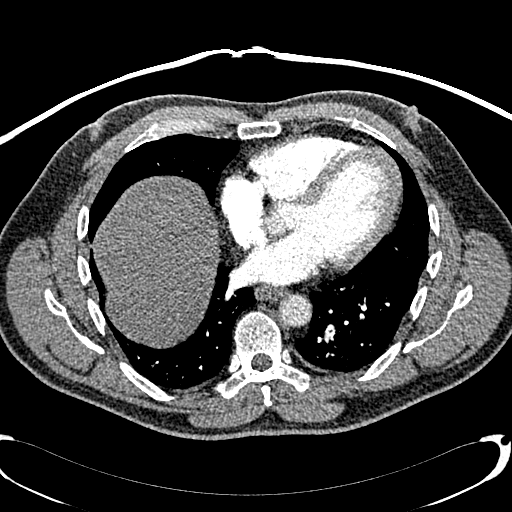
[im 113/251  lung]
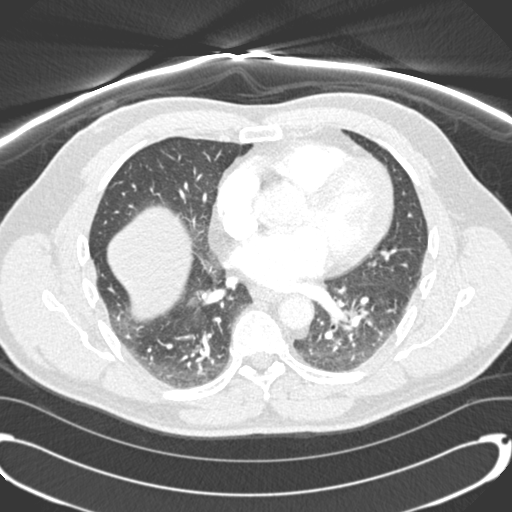
[im 138/251  mediastinal]
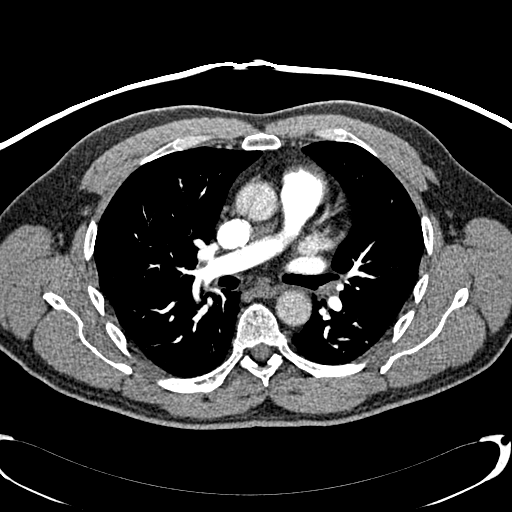
[im 151/251  lung]
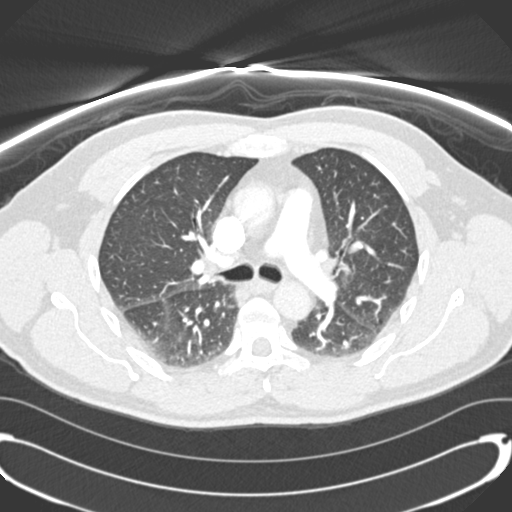
[im 163/251  mediastinal]
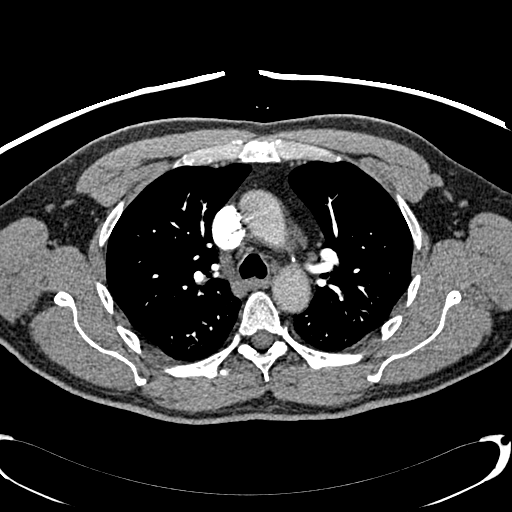
[im 176/251  lung]
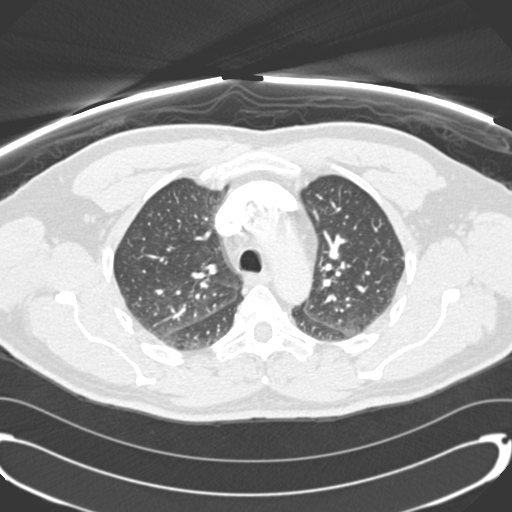
[im 188/251  mediastinal]
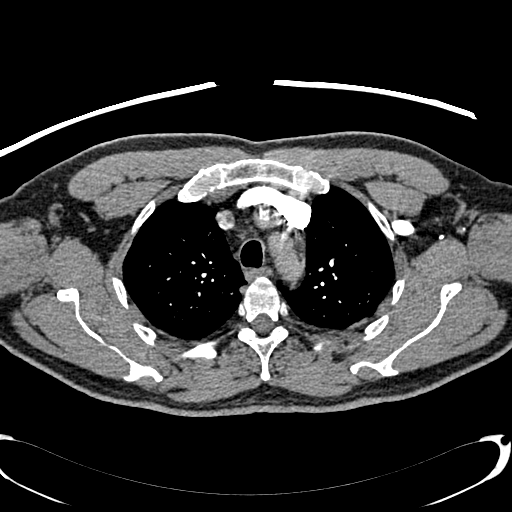
[im 201/251  lung]
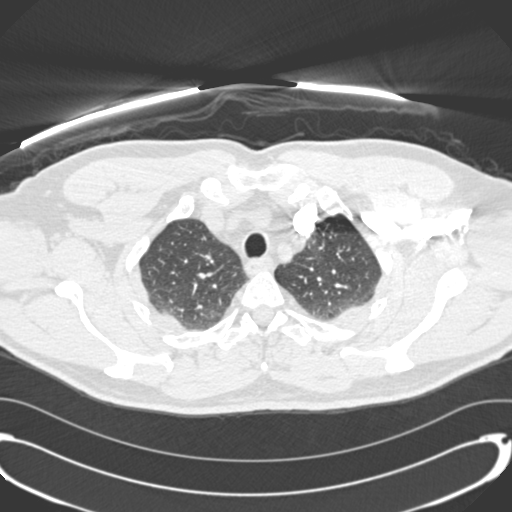
[im 213/251  mediastinal]
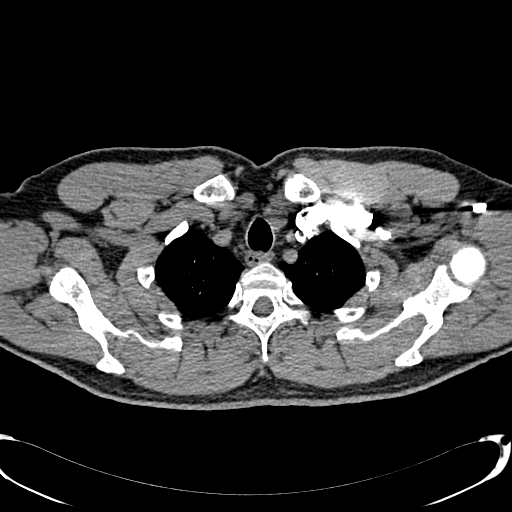
[im 226/251  lung]
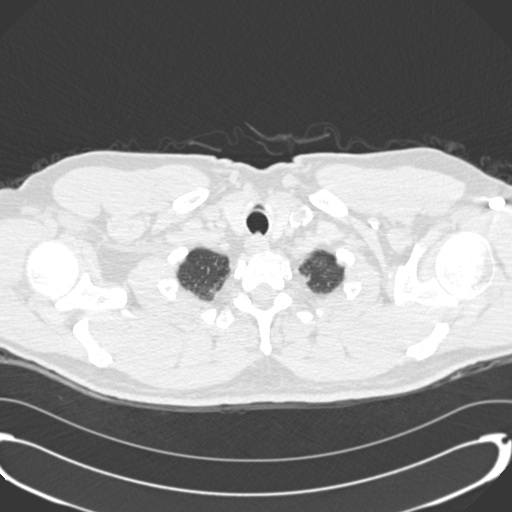
[im 238/251  mediastinal]
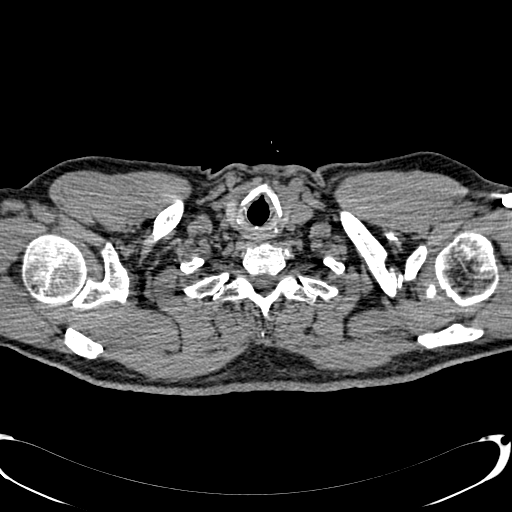

[Series 602: <mpr thick range> · coronal · 0.75mm/px · 1 of 110 slices shown]
[im 55/110  mediastinal]
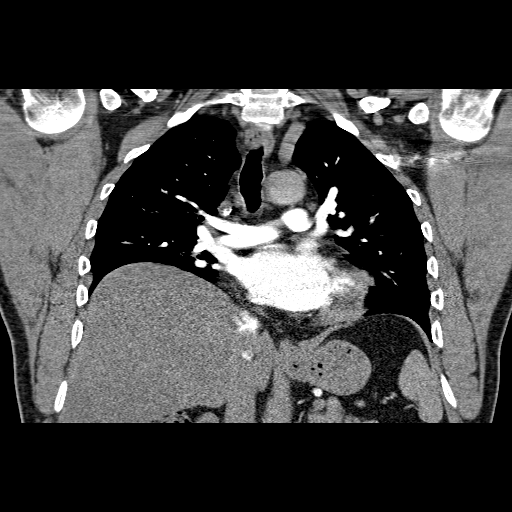

[19 of 36 positions shown; findings below may reference images not displayed]

FINDINGS: There is no filling defect within the opacified
pulmonary arteries to suggest the presence of an acute pulmonary
embolus.  No thoracic aortic aneurysm.  There is no evidence for
dissection flap within the thoracic aorta.

No axillary lymphadenopathy.  No mediastinal or hilar
lymphadenopathy.  The heart is at upper limits of normal for size.
No pericardial or pleural effusion.

Lung windows reveal a 4-5 mm nodule on the minor fissure (image 37)
which is stable since the prior study and also when comparing back
to 10/23/2002.  5 mm right middle lobe pulmonary nodule on image 45
has increased in size since 10/10/2007 and is new since 10/23/2002.
Tiny lingular nodule on image 42 is unchanged since 8445

Bone windows reveal no worrisome lytic or sclerotic osseous
lesions.  Imaging through the lung bases shows fatty infiltration
of the liver.

Review of the MIP images confirms the above findings.
IMPRESSION: No CT evidence for acute pulmonary embolus.

5 mm right middle lobe pulmonary nodule has progressed slightly
since the study from 10/10/2007 is new since 8445.  Given the
increasing conspicuity, follow-up is recommended.  Consider repeat
CT chest and 6 months without IV contrast.

## 2012-12-01 ENCOUNTER — Ambulatory Visit (INDEPENDENT_AMBULATORY_CARE_PROVIDER_SITE_OTHER): Payer: 59 | Admitting: Family Medicine

## 2012-12-01 ENCOUNTER — Ambulatory Visit (INDEPENDENT_AMBULATORY_CARE_PROVIDER_SITE_OTHER)
Admission: RE | Admit: 2012-12-01 | Discharge: 2012-12-01 | Disposition: A | Discharge status: Home / Self Care | Payer: 59 | Source: Ambulatory Visit | Attending: Family Medicine | Admitting: Family Medicine

## 2012-12-01 ENCOUNTER — Encounter: Payer: Self-pay | Admitting: Family Medicine

## 2012-12-01 VITALS — BP 128/80 | HR 76 | Temp 98.5°F | Wt 196.0 lb

## 2012-12-01 DIAGNOSIS — J019 Acute sinusitis, unspecified: Secondary | ICD-10-CM

## 2012-12-01 DIAGNOSIS — R918 Other nonspecific abnormal finding of lung field: Secondary | ICD-10-CM

## 2012-12-01 MED ORDER — ACYCLOVIR 5 % EX CREA: 1.0000 "application " | TOPICAL_CREAM | CUTANEOUS | Status: DC | PRN

## 2012-12-01 MED ORDER — FIRST-DUKES MOUTHWASH MT SUSP: OROMUCOSAL | Status: DC

## 2012-12-01 MED ORDER — AMOXICILLIN-POT CLAVULANATE 875-125 MG PO TABS: 1.0000 | ORAL_TABLET | Freq: Two times a day (BID) | ORAL | Status: DC

## 2012-12-01 MED ORDER — CEFTRIAXONE SODIUM 1 G IJ SOLR
500.0000 mg | Freq: Once | INTRAMUSCULAR | Status: AC
  Administered 2012-12-01: 500 mg via INTRAMUSCULAR

## 2012-12-01 NOTE — Progress Notes (Signed)
Pre visit review using our clinic review tool, if applicable. No additional management support is needed unless otherwise documented below in the visit note. 

## 2012-12-01 NOTE — Progress Notes (Signed)
  Subjective:    Patient ID: Steven [name], male    DOB: [birth year], 62 y.o.   MRN: [id number]  HPI Here for 2 things. First he has had one week of sinus pressure, PND, ST and blowing yellow mucus from the nose. No fever or cough. Also he has noticed a slight bulge unde the skin above the right clavicle. It is not tender.    Review of Systems  Constitutional: Negative.   HENT: Positive for congestion, postnasal drip and sinus pressure.   Eyes: Negative.   Respiratory: Negative.        Objective:   Physical Exam  Constitutional: He appears well-developed and well-nourished.  HENT:  Right Ear: External ear normal.  Left Ear: External ear normal.  Nose: Nose normal.  Mouth/Throat: Oropharynx is clear and moist.  Eyes: Conjunctivae are normal.  Neck:  He has some asymmetrical fat deposits above both clavicles, the right is slightly more prominent than the left. I do not palpate any discreet lymph nodes   Pulmonary/Chest: Effort normal and breath sounds normal.  Lymphadenopathy:    He has no cervical adenopathy.          Assessment & Plan:  Treat the sinusitis with a shot of Rocephin and Augmentin. I think he has some asymmetrical fat deposits above the clavicles but we will send him for a CXR today .

## 2012-12-01 NOTE — Addendum Note (Signed)
Addended by: Aniceto Boss A on: 12/01/2012 09:37 AM   Modules accepted: Orders

## 2013-01-24 ENCOUNTER — Other Ambulatory Visit: Payer: Self-pay | Admitting: Family Medicine

## 2013-02-07 ENCOUNTER — Encounter (HOSPITAL_COMMUNITY): Payer: Self-pay | Admitting: Emergency Medicine

## 2013-02-07 ENCOUNTER — Observation Stay (HOSPITAL_COMMUNITY)
Admission: EM | Admit: 2013-02-07 | Discharge: 2013-02-08 | Disposition: A | Discharge status: Home / Self Care | Payer: 59 | Attending: Cardiovascular Disease | Admitting: Cardiovascular Disease

## 2013-02-07 ENCOUNTER — Ambulatory Visit: Payer: 59 | Admitting: Family Medicine

## 2013-02-07 ENCOUNTER — Emergency Department (HOSPITAL_COMMUNITY): Payer: 59

## 2013-02-07 ENCOUNTER — Telehealth: Payer: Self-pay | Admitting: Family Medicine

## 2013-02-07 DIAGNOSIS — Z87891 Personal history of nicotine dependence: Secondary | ICD-10-CM | POA: Insufficient documentation

## 2013-02-07 DIAGNOSIS — Z7982 Long term (current) use of aspirin: Secondary | ICD-10-CM | POA: Insufficient documentation

## 2013-02-07 DIAGNOSIS — R079 Chest pain, unspecified: Secondary | ICD-10-CM | POA: Diagnosis present

## 2013-02-07 DIAGNOSIS — E785 Hyperlipidemia, unspecified: Secondary | ICD-10-CM | POA: Insufficient documentation

## 2013-02-07 DIAGNOSIS — I10 Essential (primary) hypertension: Secondary | ICD-10-CM | POA: Insufficient documentation

## 2013-02-07 DIAGNOSIS — Z8619 Personal history of other infectious and parasitic diseases: Secondary | ICD-10-CM | POA: Insufficient documentation

## 2013-02-07 DIAGNOSIS — R0789 Other chest pain: Principal | ICD-10-CM | POA: Insufficient documentation

## 2013-02-07 DIAGNOSIS — Z860100 Personal history of colon polyps, unspecified: Secondary | ICD-10-CM | POA: Insufficient documentation

## 2013-02-07 DIAGNOSIS — N400 Enlarged prostate without lower urinary tract symptoms: Secondary | ICD-10-CM | POA: Insufficient documentation

## 2013-02-07 DIAGNOSIS — Z888 Allergy status to other drugs, medicaments and biological substances status: Secondary | ICD-10-CM | POA: Insufficient documentation

## 2013-02-07 DIAGNOSIS — IMO0002 Reserved for concepts with insufficient information to code with codable children: Secondary | ICD-10-CM | POA: Insufficient documentation

## 2013-02-07 DIAGNOSIS — Z79899 Other long term (current) drug therapy: Secondary | ICD-10-CM | POA: Insufficient documentation

## 2013-02-07 DIAGNOSIS — K449 Diaphragmatic hernia without obstruction or gangrene: Secondary | ICD-10-CM | POA: Insufficient documentation

## 2013-02-07 DIAGNOSIS — Z87442 Personal history of urinary calculi: Secondary | ICD-10-CM | POA: Insufficient documentation

## 2013-02-07 DIAGNOSIS — Z86718 Personal history of other venous thrombosis and embolism: Secondary | ICD-10-CM | POA: Insufficient documentation

## 2013-02-07 DIAGNOSIS — K219 Gastro-esophageal reflux disease without esophagitis: Secondary | ICD-10-CM | POA: Insufficient documentation

## 2013-02-07 LAB — CREATININE, SERUM
Creatinine, Ser: 1.23 mg/dL (ref 0.50–1.35)
GFR calc non Af Amer: 60 mL/min — ABNORMAL LOW (ref >90)
GFR, EST AFRICAN AMERICAN: 70 mL/min — AB (ref >90)

## 2013-02-07 LAB — BASIC METABOLIC PANEL
BUN: 22 mg/dL (ref 6–23)
CHLORIDE: 105 meq/L (ref 96–112)
CO2: 23 meq/L (ref 19–32)
CREATININE: 1.17 mg/dL (ref 0.50–1.35)
Calcium: 9.3 mg/dL (ref 8.4–10.5)
GFR calc Af Amer: 74 mL/min — ABNORMAL LOW (ref >90)
GFR calc non Af Amer: 64 mL/min — ABNORMAL LOW (ref >90)
GLUCOSE: 91 mg/dL (ref 70–99)
Potassium: 4 mEq/L (ref 3.7–5.3)
Sodium: 143 mEq/L (ref 137–147)

## 2013-02-07 LAB — CBC
HEMATOCRIT: 42 % (ref 39.0–52.0)
HEMATOCRIT: 41.9 % (ref 39.0–52.0)
HEMOGLOBIN: 15.3 g/dL (ref 13.0–17.0)
Hemoglobin: 15.1 g/dL (ref 13.0–17.0)
MCH: 29.9 pg (ref 26.0–34.0)
MCH: 30 pg (ref 26.0–34.0)
MCHC: 36.4 g/dL — ABNORMAL HIGH (ref 30.0–36.0)
MCHC: 36 g/dL (ref 30.0–36.0)
MCV: 82.2 fL (ref 78.0–100.0)
MCV: 83.1 fL (ref 78.0–100.0)
Platelets: 244 10*3/uL (ref 150–400)
Platelets: 229 10*3/uL (ref 150–400)
RBC: 5.11 MIL/uL (ref 4.22–5.81)
RBC: 5.04 MIL/uL (ref 4.22–5.81)
RDW: 12.6 % (ref 11.5–15.5)
RDW: 12.9 % (ref 11.5–15.5)
WBC: 5.9 10*3/uL (ref 4.0–10.5)
WBC: 6.4 10*3/uL (ref 4.0–10.5)

## 2013-02-07 LAB — POCT I-STAT TROPONIN I
TROPONIN I, POC: 0 ng/mL (ref 0.00–0.08)
Troponin i, poc: 0.01 ng/mL (ref 0.00–0.08)

## 2013-02-07 LAB — TSH: TSH: 2.444 u[IU]/mL (ref 0.350–4.500)

## 2013-02-07 LAB — HEMOGLOBIN A1C
Hgb A1c MFr Bld: 5.6 % (ref <5.7)
Mean Plasma Glucose: 114 mg/dL (ref <117)

## 2013-02-07 LAB — TROPONIN I: Troponin I: <0.3 ng/mL (ref <0.30)

## 2013-02-07 MED ORDER — ENOXAPARIN SODIUM 40 MG/0.4ML ~~LOC~~ SOLN
40.0000 mg | SUBCUTANEOUS | Status: DC
  Administered 2013-02-07: 40 mg via SUBCUTANEOUS
  Filled 2013-02-07 (×2): qty 0.4

## 2013-02-07 MED ORDER — EZETIMIBE 10 MG PO TABS
10.0000 mg | ORAL_TABLET | Freq: Every evening | ORAL | Status: DC
  Administered 2013-02-07: 10 mg via ORAL
  Filled 2013-02-07 (×2): qty 1

## 2013-02-07 MED ORDER — ONDANSETRON HCL 4 MG/2ML IJ SOLN: 4.0000 mg | Freq: Four times a day (QID) | INTRAMUSCULAR | Status: DC | PRN

## 2013-02-07 MED ORDER — PANTOPRAZOLE SODIUM 40 MG PO TBEC
40.0000 mg | DELAYED_RELEASE_TABLET | Freq: Two times a day (BID) | ORAL | Status: DC
  Administered 2013-02-07 – 2013-02-08 (×2): 40 mg via ORAL
  Filled 2013-02-07 (×2): qty 1

## 2013-02-07 MED ORDER — NITROGLYCERIN 0.4 MG SL SUBL: 0.4000 mg | SUBLINGUAL_TABLET | SUBLINGUAL | Status: DC | PRN

## 2013-02-07 MED ORDER — ASPIRIN EC 81 MG PO TBEC
81.0000 mg | DELAYED_RELEASE_TABLET | Freq: Every day | ORAL | Status: DC
  Administered 2013-02-08: 81 mg via ORAL
  Filled 2013-02-07: qty 1

## 2013-02-07 MED ORDER — FOLIC ACID 1 MG PO TABS
1.0000 mg | ORAL_TABLET | Freq: Every day | ORAL | Status: DC
  Administered 2013-02-07: 1 mg via ORAL
  Filled 2013-02-07 (×2): qty 1

## 2013-02-07 MED ORDER — TEMAZEPAM 15 MG PO CAPS: 30.0000 mg | ORAL_CAPSULE | Freq: Every evening | ORAL | Status: DC | PRN

## 2013-02-07 MED ORDER — ACETAMINOPHEN 325 MG PO TABS: 650.0000 mg | ORAL_TABLET | ORAL | Status: DC | PRN

## 2013-02-07 MED ORDER — FENOFIBRATE 160 MG PO TABS
160.0000 mg | ORAL_TABLET | Freq: Every evening | ORAL | Status: DC
  Administered 2013-02-07: 160 mg via ORAL
  Filled 2013-02-07 (×2): qty 1

## 2013-02-07 MED ORDER — METOPROLOL TARTRATE 12.5 MG HALF TABLET
12.5000 mg | ORAL_TABLET | Freq: Two times a day (BID) | ORAL | Status: DC
  Filled 2013-02-07 (×3): qty 1

## 2013-02-07 MED ORDER — ELETRIPTAN HYDROBROMIDE 20 MG PO TABS
20.0000 mg | ORAL_TABLET | ORAL | Status: DC | PRN
  Filled 2013-02-07: qty 1

## 2013-02-07 MED ORDER — AMLODIPINE BESYLATE 5 MG PO TABS
5.0000 mg | ORAL_TABLET | Freq: Every day | ORAL | Status: DC
  Administered 2013-02-08: 5 mg via ORAL
  Filled 2013-02-07 (×2): qty 1

## 2013-02-07 MED ORDER — ASPIRIN 81 MG PO CHEW
324.0000 mg | CHEWABLE_TABLET | Freq: Once | ORAL | Status: AC
  Administered 2013-02-07: 324 mg via ORAL
  Filled 2013-02-07: qty 4

## 2013-02-07 NOTE — Telephone Encounter (Signed)
I spoke with pt and he has had chest three times last night and sweats. Also the night before he had fast heartbeat and sweating. Dr. Sarajane Jews did recommend pt go to the [facility] ([place] ER, pt does have a history of PE. Pt did agree to have wife drive him right now.

## 2013-02-07 NOTE — ED Notes (Signed)
Cardiologist at bedside.  

## 2013-02-07 NOTE — H&P (Signed)
Cardiologist:  New PCP: Laurey Morale, MD  Steven [name] is an 63 y.o. male.   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  He quit smoking 40 years ago.  He has a history of DVT, PE, reflux, HLD, HTN, BPH, kidney stones.  He was on coumadin but only ASA now.  He had a coronary angiogram ~10 years ago and it was clear.  He presents today with CP which woke him from sleep on Monday evening.  He describes it as a "deep aching feeling", 10/10 with a "clammy feeling".  It resolved on its own but he had another episode Tuesday night which radiated to his back.  He currently has 1/10 "discomfort".  The patient currently denies nausea, vomiting, fever, shortness of breath, orthopnea, dizziness, PND, cough, congestion, abdominal pain, hematochezia, melena, lower extremity edema, claudication.  His father had CABG in his 61's and both his older and younger brothers have cardiac disease.  The patient is very active and was out on the last day of deer season(Jan 1).  He got two and his wife got one.  He did drag them a short distance without difficulty.  Last week he was in the woods walking up and down the hills without discomfort or pain.  Troponin is negative with no acute EKG changes.  He also reports severe, sharp abdominal pain yesterday which he thought was gas and resolved after taking Gas-X.     Past Medical History  Diagnosis Date  . Intestinal infection due to other organism, not elsewhere classified   . Personal history of venous thrombosis and embolism   . Hyperlipemia   . Personal history of urinary calculi   . Esophageal reflux   . Hypertrophy (benign) of prostate   . Herpes simplex without mention of complication   . Displacement of intervertebral disc, site unspecified, without myelopathy   . Personal history of colonic polyps   . Hiatal hernia   . Kidney stones     sees Dr. Junious Silk  . Headache(784.0)       sinus and migraines  . HTN (hypertension) 05/10/2012    Past Surgical History  Procedure Laterality Date  . Appendectomy    . Tonsillectomy    . Lithotripsy    . Gallbladder surgery    . Finger surgery      3rd finger trigger bilateral   . Cervical disc surgery  1996 and 1998  . Esophagogastroduodenoscopy  02-24-10    per Dr. Ardis Hughs, small hiatal hernia only   . Colonoscopy  02-24-10    per Dr. Ardis Hughs, repeat in 5 yrs   . Ureteral stent placement  July 2012    left ureter, per Dr. Junious Silk, for a stone   . Rotator cuff repair Right 01-10-12    per Dr. Esmond Plants, also repair torn biceps tendon   . Cholecystectomy    . Esophagogastroduodenoscopy (egd) with esophageal dilation N/A 04/13/2012    Procedure: ESOPHAGOGASTRODUODENOSCOPY (EGD) WITH ESOPHAGEAL DILATION;  Surgeon: Milus Banister, MD;  Location: [facility];  Service: Endoscopy;  Laterality: N/A;  possible botox injection  . Botox injection N/A 04/13/2012    Procedure: BOTOX INJECTION;  Surgeon: Milus Banister, MD;  Location: [facility];  Service: Endoscopy;  Laterality: N/A;    Family History  Problem Relation Age of Onset  . Colon cancer Father   . Heart attack Father   .  Heart disease Brother   . Heart disease Brother    Social History:  reports that he quit smoking about 39 years ago. His smoking use included Cigarettes. He has a 30 pack-year smoking history. He has never used smokeless tobacco. He reports that he does not drink alcohol or use illicit drugs.  Allergies:  Allergies  Allergen Reactions  . Amitriptyline Hcl Other (See Comments)    Mood changes  . Amoxicillin-Pot Clavulanate     REACTION: nausea  . Cyclobenzaprine Hcl     unknown  . Glycopyrrolate     unknown  . Lisinopril-Hydrochlorothiazide     depression  . Morphine     headache  . Prednisone Other (See Comments)    Blurred vision     (Not in a hospital admission)  Results for orders placed during the hospital encounter of  02/07/13 (from the past 48 hour(s))  CBC     Status: Abnormal   Collection Time    02/07/13 10:07 AM      Result Value Range   WBC 5.9  4.0 - 10.5 K/uL   RBC 5.11  4.22 - 5.81 MIL/uL   Hemoglobin 15.3  13.0 - 17.0 g/dL   HCT 42.0  39.0 - 52.0 %   MCV 82.2  78.0 - 100.0 fL   MCH 29.9  26.0 - 34.0 pg   MCHC 36.4 (*) 30.0 - 36.0 g/dL   RDW 12.6  11.5 - 15.5 %   Platelets 244  150 - 400 K/uL  BASIC METABOLIC PANEL     Status: Abnormal   Collection Time    02/07/13 10:07 AM      Result Value Range   Sodium 143  137 - 147 mEq/L   Potassium 4.0  3.7 - 5.3 mEq/L   Chloride 105  96 - 112 mEq/L   CO2 23  19 - 32 mEq/L   Glucose, Bld 91  70 - 99 mg/dL   BUN 22  6 - 23 mg/dL   Creatinine, Ser 1.17  0.50 - 1.35 mg/dL   Calcium 9.3  8.4 - 10.5 mg/dL   GFR calc non Af Amer 64 (*) >90 mL/min   GFR calc Af Amer 74 (*) >90 mL/min   Comment: (NOTE)     The eGFR has been calculated using the CKD EPI equation.     This calculation has not been validated in all clinical situations.     eGFR's persistently <90 mL/min signify possible Chronic Kidney     Disease.  POCT I-STAT TROPONIN I     Status: None   Collection Time    02/07/13 10:39 AM      Result Value Range   Troponin i, poc 0.01  0.00 - 0.08 ng/mL   Comment 3            Comment: Due to the release kinetics of cTnI,     a negative result within the first hours     of the onset of symptoms does not rule out     myocardial infarction with certainty.     If myocardial infarction is still suspected,     repeat the test at appropriate intervals.   Dg Chest 2 View  02/07/2013   CLINICAL DATA:  Chest pain radiating to the back.  EXAM: CHEST  2 VIEW  COMPARISON:  12/01/2012  FINDINGS: Heart size is normal. Mediastinal shadows are normal. The lungs are clear. No effusions. No acute bony findings. Previous cervical fusion.  IMPRESSION: No active cardiopulmonary disease.   Electronically Signed   By: Nelson Chimes M.D.   On: 02/07/2013 11:04     Review of Systems  Constitutional: Positive for diaphoresis ("Clammy"). Negative for fever.  HENT: Negative for congestion and sore throat.   Respiratory: Negative for cough and shortness of breath.   Cardiovascular: Positive for chest pain. Negative for orthopnea, claudication, leg swelling and PND.  Gastrointestinal: Positive for abdominal pain (Yesterday, "Sharp"). Negative for nausea, vomiting, blood in stool and melena.  Genitourinary: Negative for hematuria.  Musculoskeletal: Positive for back pain (Radiated from Chest).  Neurological: Negative for dizziness.  All other systems reviewed and are negative.    Blood pressure 129/79, pulse 64, temperature 98.6 F (37 C), resp. rate 18, weight 199 lb (90.266 kg), SpO2 93.00%. Physical Exam  Nursing note and vitals reviewed. Constitutional: He appears well-developed and well-nourished. No distress.  HENT:  Head: Normocephalic.  Mouth/Throat: Oropharynx is clear and moist. No oropharyngeal exudate.  Eyes: EOM are normal. Pupils are equal, round, and reactive to light. No scleral icterus.  Neck: Normal range of motion. Neck supple. No JVD present.  Cardiovascular: Normal rate, regular rhythm, S1 normal and S2 normal.   No murmur heard. Pulses:      Radial pulses are 2+ on the right side, and 2+ on the left side.       Dorsalis pedis pulses are 2+ on the right side, and 2+ on the left side.  No carotid Bruits  Respiratory: Effort normal and breath sounds normal. He has no wheezes. He has no rales. He exhibits no tenderness.  GI: Soft. Bowel sounds are normal. He exhibits no distension. There is no tenderness.  Musculoskeletal: He exhibits no edema.  Lymphadenopathy:    He has no cervical adenopathy.  Neurological: He is alert. He exhibits normal muscle tone.  Skin: Skin is warm.  Psychiatric: He has a normal mood and affect.     Assessment/Plan Principal Problem:   Chest pain Active Problems:   HYPERLIPIDEMIA   GERD    Personal history of venous thrombosis and embolism   HTN (hypertension)  Plan:  The patient will be admitted for observation to the CP floor.  Treadmill myoview in the AM.  Recurrent PE could be considered, however, I would expect the pain to be more prolonged and associated with SOB and tachycardia.  He has neither.  GERD is also a possibility.  Onset of symptoms was while lying down each time.   He takes Aciphex at home.  Will continue PPI.  EKG shows no acute changes.  First troponin negative.      HAGER, BRYAN 02/07/2013, 1:25 PM   I have seen and examined the patient along with Tarri Fuller, PA.  I have reviewed the chart, notes and new data.  I agree with PA's note.  Key new complaints: Still has mild ongoing chest discomfort, points with 2 fingers to parasternal 5th intercostal space Key examination changes: normal exam Key new findings / data: normal ECG and labs  PLAN: Symptoms are atypical, pointing more towards GERD rather than CAD. However, he has a remarkably prominent family history of early CAD. Reportedly had normal cath at [facility] about 10 years ago. Monitor overnight, stress test in AM.  Sanda Klein, MD, [facility] [phone] 02/07/2013, 2:26 PM

## 2013-02-07 NOTE — Progress Notes (Signed)
Unit CM UR Completed by [facility] ED CM  W. Lavora Brisbon RN  

## 2013-02-07 NOTE — ED Notes (Signed)
Cp started Monday night and then had fast heart rate got sweaty and then last night pain went to back so he called his dr and he is here

## 2013-02-08 ENCOUNTER — Encounter (HOSPITAL_COMMUNITY): Payer: Self-pay | Admitting: General Practice

## 2013-02-08 ENCOUNTER — Observation Stay (HOSPITAL_COMMUNITY): Payer: 59

## 2013-02-08 DIAGNOSIS — R079 Chest pain, unspecified: Secondary | ICD-10-CM

## 2013-02-08 LAB — BASIC METABOLIC PANEL
BUN: 21 mg/dL (ref 6–23)
CHLORIDE: 103 meq/L (ref 96–112)
CO2: 24 meq/L (ref 19–32)
Calcium: 9 mg/dL (ref 8.4–10.5)
Creatinine, Ser: 1.32 mg/dL (ref 0.50–1.35)
GFR calc Af Amer: 64 mL/min — ABNORMAL LOW (ref >90)
GFR calc non Af Amer: 55 mL/min — ABNORMAL LOW (ref >90)
GLUCOSE: 95 mg/dL (ref 70–99)
POTASSIUM: 4.7 meq/L (ref 3.7–5.3)
SODIUM: 141 meq/L (ref 137–147)

## 2013-02-08 LAB — TROPONIN I
Troponin I: <0.3 ng/mL (ref <0.30)
Troponin I: <0.3 ng/mL (ref <0.30)

## 2013-02-08 LAB — LIPID PANEL
CHOL/HDL RATIO: 5.5 ratio
CHOLESTEROL: 176 mg/dL (ref 0–200)
HDL: 32 mg/dL — AB (ref >39)
LDL CALC: 99 mg/dL (ref 0–99)
TRIGLYCERIDES: 227 mg/dL — AB (ref <150)
VLDL: 45 mg/dL — AB (ref 0–40)

## 2013-02-08 MED ORDER — TECHNETIUM TC 99M SESTAMIBI GENERIC - CARDIOLITE
30.0000 | Freq: Once | INTRAVENOUS | Status: AC | PRN
  Administered 2013-02-08: 30 via INTRAVENOUS

## 2013-02-08 MED ORDER — TECHNETIUM TC 99M SULFUR COLLOID FILTERED: 0.5000 | Freq: Once | INTRAVENOUS | Status: DC | PRN

## 2013-02-08 MED ORDER — TECHNETIUM TC 99M SESTAMIBI GENERIC - CARDIOLITE
10.0000 | Freq: Once | INTRAVENOUS | Status: AC | PRN
  Administered 2013-02-08: 10 via INTRAVENOUS

## 2013-02-08 NOTE — Progress Notes (Signed)
Pt very anxiety re CAD  On chronic PPI rx so would have LOW threshold for pursing cath following myoview Pt had myoview preciously at [facility]

## 2013-02-08 NOTE — Progress Notes (Signed)
Subjective: He reports 1-2/10 dull ache in his chest this morning.  It resolved by the start of the treadmill.  Objective: Vital signs in last 24 hours: Temp:  [98.1 F (36.7 C)-98.6 F (37 C)] 98.1 F (36.7 C) (01/28 2148) Pulse Rate:  [62-77] 65 (01/29 0745) Resp:  [12-18] 18 (01/28 2148) BP: (118-153)/(70-100) 123/74 mmHg (01/29 0745) SpO2:  [93 %-98 %] 96 % (01/28 2148) Weight:  [199 lb (90.266 kg)] 199 lb (90.266 kg) (01/28 0946)    Intake/Output from previous day:   Intake/Output this shift:    Medications Current Facility-Administered Medications  Medication Dose Route Frequency Provider Last Rate Last Dose  . acetaminophen (TYLENOL) tablet 650 mg  650 mg Oral Q4H PRN Tarri Fuller, PA-C      . amLODipine (NORVASC) tablet 5 mg  5 mg Oral Daily Tarri Fuller, PA-C      . aspirin EC tablet 81 mg  81 mg Oral Daily Tarri Fuller, PA-C      . eletriptan (RELPAX) tablet 20 mg  20 mg Oral PRN Tarri Fuller, PA-C      . enoxaparin (LOVENOX) injection 40 mg  40 mg Subcutaneous Q24H Tarri Fuller, PA-C   40 mg at 02/07/13 1744  . ezetimibe (ZETIA) tablet 10 mg  10 mg Oral QPM Tarri Fuller, PA-C   10 mg at 02/07/13 2137  . fenofibrate tablet 160 mg  160 mg Oral QPM Tarri Fuller, PA-C   160 mg at 02/07/13 2137  . folic acid (FOLVITE) tablet 1 mg  1 mg Oral Daily Tarri Fuller, PA-C   1 mg at 02/07/13 2137  . metoprolol tartrate (LOPRESSOR) tablet 12.5 mg  12.5 mg Oral BID Tarri Fuller, PA-C      . nitroGLYCERIN (NITROSTAT) SL tablet 0.4 mg  0.4 mg Sublingual Q5 Min x 3 PRN Tarri Fuller, PA-C      . ondansetron (ZOFRAN) injection 4 mg  4 mg Intravenous Q6H PRN Tarri Fuller, PA-C      . pantoprazole (PROTONIX) EC tablet 40 mg  40 mg Oral BID Tarri Fuller, PA-C   40 mg at 02/07/13 2137  . temazepam (RESTORIL) capsule 30 mg  30 mg Oral QHS PRN Tarri Fuller, PA-C        PE: General appearance: alert, cooperative and no distress Lungs: clear to auscultation bilaterally Heart: regular rate and  rhythm, S1, S2 normal, no murmur, click, rub or gallop Extremities: No LEE Pulses: 2+ and symmetric Skin: Warm and dry Neurologic: Grossly normal  Lab Results:   Recent Labs  02/07/13 1007 02/07/13 1810  WBC 5.9 6.4  HGB 15.3 15.1  HCT 42.0 41.9  PLT 244 229   BMET  Recent Labs  02/07/13 1007 02/07/13 1810 02/08/13 0447  NA 143  --  141  K 4.0  --  4.7  CL 105  --  103  CO2 23  --  24  GLUCOSE 91  --  95  BUN 22  --  21  CREATININE 1.17 1.23 1.32  CALCIUM 9.3  --  9.0   PT/INR No results found for this basename: LABPROT, INR,  in the last 72 hours Cholesterol  Recent Labs  02/08/13 0447  CHOL 176   Cardiac Panel (last 3 results)  Recent Labs  02/07/13 1645 02/07/13 2317 02/08/13 0447  TROPONINI <0.30 <0.30 <0.30    Lipid Panel     Component Value Date/Time   CHOL 176 02/08/2013 0447   TRIG 227* 02/08/2013 0447  HDL 32* 02/08/2013 0447   CHOLHDL 5.5 02/08/2013 0447   VLDL 45* 02/08/2013 0447   LDLCALC 99 02/08/2013 0447      Assessment/Plan   Principal Problem:   Chest pain Active Problems:   HYPERLIPIDEMIA   GERD   Personal history of venous thrombosis and embolism   HTN (hypertension)   Plan:  Ruled out for MI.  Asymptomatic on Treadmill.  No acute ekg changes.  Interpretation to follow. Triglycerides are elevated.  We discussed reducing carbohydrate intake.  Symptoms are possibly GI related.     LOS: 1 day    Steven [name] 02/08/2013 8:56 AM

## 2013-02-08 NOTE — Progress Notes (Signed)
Reviewed discharge instructions with patient and wife, they stated their understanding.  Patient discharged home with wife.  Sanda Linger

## 2013-02-08 NOTE — Discharge Instructions (Signed)
Chest Pain (Nonspecific) °Chest pain has many causes. Your pain could be caused by something serious, such as a heart attack or a blood clot in the lungs. It could also be caused by something less serious, such as a chest bruise or a virus. Follow up with your doctor. More lab tests or other studies may be needed to find the cause of your pain. Most of the time, nonspecific chest pain will improve within 2 to 3 days of rest and mild pain medicine. °HOME CARE °· For chest bruises, you may put ice on the sore area for 15-20 minutes, 03-04 times a day. Do this only if it makes you feel better. °· Put ice in a plastic bag. °· Place a towel between the skin and the bag. °· Rest for the next 2 to 3 days. °· Go back to work if the pain improves. °· See your doctor if the pain lasts longer than 1 to 2 weeks. °· Only take medicine as told by your doctor. °· Quit smoking if you smoke. °GET HELP RIGHT AWAY IF:  °· There is more pain or pain that spreads to the arm, neck, jaw, back, or belly (abdomen). °· You have shortness of breath. °· You cough more than usual or cough up blood. °· You have very bad back or belly pain, feel sick to your stomach (nauseous), or throw up (vomit). °· You have very bad weakness. °· You pass out (faint). °· You have a fever. °Any of these problems may be serious and may be an emergency. Do not wait to see if the problems will go away. Get medical help right away. Call your local emergency services 911 in [place] Do not drive yourself to the hospital. °MAKE SURE YOU:  °· Understand these instructions. °· Will watch this condition. °· Will get help right away if you or your child is not doing well or gets worse. °Document Released: 06/16/2007 Document Revised: 03/22/2011 Document Reviewed: 06/16/2007 °ExitCare® Patient Information ©2014 [employer]. ° °

## 2013-02-08 NOTE — Discharge Summary (Signed)
Physician Discharge Summary  Patient ID: Steven [name] MRN: [id number] DOB/AGE: May 20, 1950 63 y.o.  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 He has a history of DVT, PE, reflux, HLD, HTN, BPH, kidney stones. He was on coumadin but only ASA now. He had a coronary angiogram ~10 years ago and it was clear. He presents today with CP which woke him from sleep on Monday evening. He describes it as a "deep aching feeling", 10/10 with a "clammy feeling". It resolved on its own but he had another episode Tuesday night which radiated to his back. He currently has 1/10 "discomfort". The patient currently denies nausea, vomiting, fever, shortness of breath, orthopnea, dizziness, PND, cough, congestion, abdominal pain, hematochezia, melena, lower extremity edema, claudication. His father had CABG in his 46's and both his older and younger brothers have cardiac disease. The patient is very active and was out on the last day of deer season(Jan 1). He got two and his wife got one. He did drag them a short distance without difficulty. Last week he was in the woods walking up and down the hills without discomfort or pain. Troponin is negative with no acute EKG changes. He also reports severe, sharp abdominal pain yesterday which he thought was gas and resolved after taking Gas-X.  The patient was admitted for observation.  He was started on lopressor 12.5mg  bid empirically.  He ruled out for MI.  Treadmill myoview was nonischemic with EF of 64%.  He did have another episode of mild  CP early in the morning on 02/08/13.  It resolved spontaneously.  Triglycerides are elevated at 227.  We discussed decreasing carbohydrate intake and eating healthy fats, along with exercise to increase HDL.   The patient history includes GERD and esophageal stricture.   His last EGD was two years ago and he was given Botox injections at that time.  I recommended continued Aciphex and follow up with GI physician.  No eating within two hours of going to bed.   Lopressor DCd.  Follow up with Dr. Sallyanne Kuster as needed.  The patient was seen by Dr. Caryl Comes who felt he was stable for DC home.    Consults: None  Significant Diagnostic Studies:  EXAM: MYOCARDIAL IMAGING WITH SPECT (REST AND PHARMACOLOGIC-STRESS) GATED LEFT VENTRICULAR WALL MOTION STUDY LEFT VENTRICULAR EJECTION FRACTION  TECHNIQUE: Standard myocardial SPECT imaging was performed after resting intravenous injection of 10 mCi Tc-21m sestamibi. Subsequently, intravenous infusion of Lexiscan was performed under the supervision of the Cardiology staff. At peak effect of the drug, 30 mCi Tc-51m sestamibi was injected intravenously and standard myocardial SPECT imaging was performed. Quantitative gated imaging was also performed to evaluate left ventricular wall motion, and estimate left ventricular ejection fraction.  COMPARISON: Chest x-ray 02/07/2013; prior CT chest 08/13/2011  FINDINGS: Evaluation of the cardiac a gated data demonstrates an end-diastolic volume of 69 mL and end systolic volume of 25 mL yielding a calculated ejection fraction of 64%. There is no evidence of focal or global cardiac wall motion abnormality.  Evaluation of the  static resting and post pharmacological stress images demonstrate normal and symmetric radiotracer uptake throughout the ventricular myocardium. No fixed or reversible perfusion defect.  IMPRESSION: 1. No evidence for inducible/reversible ischemia. 2. Normal cardiac wall motion. 3. Calculated  ejection fraction 64%.   Electronically Signed By: Jacqulynn Cadet M.D. On: 02/08/2013 14:11  Lipid Panel     Component Value Date/Time   CHOL 176 02/08/2013 0447   TRIG 227* 02/08/2013 0447   HDL 32* 02/08/2013 0447   CHOLHDL 5.5 02/08/2013 0447   VLDL 45* 02/08/2013 0447   LDLCALC 99 02/08/2013 0447    Treatments: See above  Discharge Exam: Blood pressure 147/81, pulse 93, temperature 98.1 F (36.7 C), temperature source Oral, resp. rate 18, weight 199 lb (90.266 kg), SpO2 96.00%.   Disposition: 01-Home or Self Care      Discharge Orders   Future Orders Complete By Expires   Diet - low sodium heart healthy  As directed    Discharge instructions  As directed    Comments:     Follow up with GI physician.       Medication List         amLODipine 5 MG tablet  Commonly known as:  NORVASC  Take 1 tablet (5 mg total) by mouth daily.     aspirin EC 81 MG tablet  Take 81 mg by mouth daily.     eletriptan 20 MG tablet  Commonly known as:  RELPAX  Take 1 tablet (20 mg total) by mouth as needed for migraine.     ezetimibe 10 MG tablet  Commonly known as:  ZETIA  Take 10 mg by mouth every evening.     fenofibrate 145 MG tablet  Commonly known as:  TRICOR  Take 145 mg by mouth every evening.     FIRST-DUKES MOUTHWASH Susp  Use as directed 5 mLs in the mouth or throat every 6 (six) hours as needed (ulcers). Swish and spit     folic acid 1 MG tablet  Commonly known as:  FOLVITE  Take 1 mg by mouth daily.     RABEprazole 20 MG tablet  Commonly known as:  ACIPHEX  Take 20 mg by mouth 2 (two) times daily.     temazepam 30 MG capsule  Commonly known as:  RESTORIL  Take 30 mg by mouth at bedtime as needed for sleep or anxiety.     valACYclovir 500 MG tablet  Commonly known as:  VALTREX  Take 500 mg by mouth 2 (two) times daily as needed (herpes).       Follow-up Information   Follow up with Sanda Klein, MD. (As needed)    Specialty:  Cardiology   Contact  information:   [address] [place] [address] [phone]      Signed: Tarri Fuller 02/08/2013, 4:04 PM

## 2013-02-09 ENCOUNTER — Telehealth: Payer: Self-pay | Admitting: Gastroenterology

## 2013-02-09 ENCOUNTER — Telehealth: Payer: Self-pay

## 2013-02-09 DIAGNOSIS — K220 Achalasia of cardia: Secondary | ICD-10-CM

## 2013-02-09 NOTE — Telephone Encounter (Signed)
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 Referral to general surgery was offered last year, still would refer now.  Thanks

## 2013-02-09 NOTE — Telephone Encounter (Signed)
[facility] to call pt  

## 2013-02-09 NOTE — Telephone Encounter (Signed)
Pt aware referral has been made 

## 2013-02-09 NOTE — Telephone Encounter (Signed)
Pt was seen in the ER last night for chest pain, cardiac was ruled out.  Pt was seen in May 2014 and was diagnosed with Acalashia and was advised he could see a Psychologist, sport and exercise.  Do you want to see the pt or refer to surgeon? [phone]

## 2013-02-09 NOTE — Telephone Encounter (Signed)
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

## 2013-02-09 NOTE — ED Provider Notes (Signed)
CSN: [id number]     Arrival date & time 02/07/13  [id number] History   First MD Initiated Contact with Patient 02/07/13 1017     Chief Complaint  Patient presents with  . Chest Pain    HPI  Patient presents with a three-day history of episodes of chest pain. These all been nocturnal, happening approximately 3 of 4 AM. Described as a pressure in his chest. He said that for the last 3 consecutive nights. 2 nights ago he felt like his heart racing fast and he felt sweaty and clammy. He has had no symptoms during the day. He has not get angina when he exerts. He admits that he does not exercise, however with stairs her ADLs he has not had similar symptoms. He had left lower extremity DVT, and pulmonary embolus over 8 years ago. He completed 9 months of Coumadin therapy. Etiology of the thrombus and embolus were not ascertained per his report.  He had a normal coronary angiogram over 10 years ago. This was done because of atypical pain. His father and brother both had coronary artery disease. He is hypertensive, and has hyperlipidemia on 2 medications. He is a lifetime nonsmoker.  Past Medical History  Diagnosis Date  . Intestinal infection due to other organism, not elsewhere classified   . Personal history of venous thrombosis and embolism   . Hyperlipemia   . Personal history of urinary calculi   . Esophageal reflux   . Hypertrophy (benign) of prostate   . Herpes simplex without mention of complication   . Displacement of intervertebral disc, site unspecified, without myelopathy   . Personal history of colonic polyps   . Hiatal hernia   . Kidney stones     sees Dr. Junious Silk  . Headache(784.0)     sinus and migraines  . HTN (hypertension) 05/10/2012   Past Surgical History  Procedure Laterality Date  . Appendectomy    . Tonsillectomy    . Lithotripsy    . Gallbladder surgery    . Finger surgery      3rd finger trigger bilateral   . Cervical disc surgery  1996 and 1998  .  Esophagogastroduodenoscopy  02-24-10    per Dr. Ardis Hughs, small hiatal hernia only   . Colonoscopy  02-24-10    per Dr. Ardis Hughs, repeat in 5 yrs   . Ureteral stent placement  July 2012    left ureter, per Dr. Junious Silk, for a stone   . Rotator cuff repair Right 01-10-12    per Dr. Esmond Plants, also repair torn biceps tendon   . Cholecystectomy    . Esophagogastroduodenoscopy (egd) with esophageal dilation N/A 04/13/2012    Procedure: ESOPHAGOGASTRODUODENOSCOPY (EGD) WITH ESOPHAGEAL DILATION;  Surgeon: Milus Banister, MD;  Location: [facility];  Service: Endoscopy;  Laterality: N/A;  possible botox injection  . Botox injection N/A 04/13/2012    Procedure: BOTOX INJECTION;  Surgeon: Milus Banister, MD;  Location: [facility];  Service: Endoscopy;  Laterality: N/A;   Family History  Problem Relation Age of Onset  . Colon cancer Father   . Heart attack Father   . Heart disease Brother   . Heart disease Brother    History  Substance Use Topics  . Smoking status: Former Smoker -- 1.50 packs/day for 20 years    Types: Cigarettes    Quit date: 03/03/1973  . Smokeless tobacco: Never Used  . Alcohol Use: No    Review of Systems  Constitutional: Negative for fever, chills, diaphoresis,  appetite change and fatigue.  HENT: Negative for mouth sores, sore throat and trouble swallowing.   Eyes: Negative for visual disturbance.  Respiratory: Negative for cough, chest tightness, shortness of breath and wheezing.   Cardiovascular: Negative for chest pain.       No murmur. Normal cardiac exam. No ectopy  Gastrointestinal: Negative for nausea, vomiting, abdominal pain, diarrhea and abdominal distention.  Endocrine: Negative for polydipsia, polyphagia and polyuria.  Genitourinary: Negative for dysuria, frequency and hematuria.  Musculoskeletal: Negative for gait problem.  Skin: Negative for color change, pallor and rash.       No asymmetry swelling or pain to lower extremities.  Neurological:  Negative for dizziness, syncope, light-headedness and headaches.  Hematological: Does not bruise/bleed easily.  Psychiatric/Behavioral: Negative for behavioral problems and confusion.    Allergies  Amitriptyline hcl; Amoxicillin-pot clavulanate; Cyclobenzaprine hcl; Glycopyrrolate; Lisinopril-hydrochlorothiazide; Morphine; and Prednisone  Home Medications   Current Outpatient Rx  Name  Route  Sig  Dispense  Refill  . amLODipine (NORVASC) 5 MG tablet   Oral   Take 1 tablet (5 mg total) by mouth daily.   90 tablet   3   . aspirin EC 81 MG tablet   Oral   Take 81 mg by mouth daily.         . Diphenhyd-Hydrocort-Nystatin (FIRST-DUKES MOUTHWASH) SUSP   Mouth/Throat   Use as directed 5 mLs in the mouth or throat every 6 (six) hours as needed (ulcers). Swish and spit         . eletriptan (RELPAX) 20 MG tablet   Oral   Take 1 tablet (20 mg total) by mouth as needed for migraine.   30 tablet   1   . ezetimibe (ZETIA) 10 MG tablet   Oral   Take 10 mg by mouth every evening.         . fenofibrate (TRICOR) 145 MG tablet   Oral   Take 145 mg by mouth every evening.         . folic acid (FOLVITE) 1 MG tablet   Oral   Take 1 mg by mouth daily.         . RABEprazole (ACIPHEX) 20 MG tablet   Oral   Take 20 mg by mouth 2 (two) times daily.         Marland Kitchen EXPIRED: temazepam (RESTORIL) 30 MG capsule   Oral   Take 30 mg by mouth at bedtime as needed for sleep or anxiety.          . valACYclovir (VALTREX) 500 MG tablet   Oral   Take 500 mg by mouth 2 (two) times daily as needed (herpes).          BP 147/81  Pulse 93  Temp(Src) 98.1 F (36.7 C) (Oral)  Resp 18  Wt 199 lb (90.266 kg)  SpO2 96% Physical Exam  Constitutional: He is oriented to person, place, and time. He appears well-developed and well-nourished. No distress.  Pleasant conversant adult male in no distress.  HENT:  Head: Normocephalic.  Eyes: Conjunctivae are normal. Pupils are equal, round, and  reactive to light. No scleral icterus.  Neck: Normal range of motion. Neck supple. No thyromegaly present.  Cardiovascular: Normal rate and regular rhythm.  Exam reveals no gallop and no friction rub.   No murmur heard. Pulmonary/Chest: Effort normal and breath sounds normal. No respiratory distress. He has no wheezes. He has no rales.  Abdominal: Soft. Bowel sounds are normal. He exhibits no distension.  There is no tenderness. There is no rebound.  Musculoskeletal: Normal range of motion.  Neurological: He is alert and oriented to person, place, and time.  Skin: Skin is warm and dry. No rash noted.  Psychiatric: He has a normal mood and affect. His behavior is normal.    ED Course  Procedures (including critical care time) Labs Review Labs Reviewed  CBC - Abnormal; Notable for the following:    MCHC 36.4 (*)    All other components within normal limits  BASIC METABOLIC PANEL - Abnormal; Notable for the following:    GFR calc non Af Amer 64 (*)    GFR calc Af Amer 74 (*)    All other components within normal limits  CREATININE, SERUM - Abnormal; Notable for the following:    GFR calc non Af Amer 60 (*)    GFR calc Af Amer 70 (*)    All other components within normal limits  BASIC METABOLIC PANEL - Abnormal; Notable for the following:    GFR calc non Af Amer 55 (*)    GFR calc Af Amer 64 (*)    All other components within normal limits  LIPID PANEL - Abnormal; Notable for the following:    Triglycerides 227 (*)    HDL 32 (*)    VLDL 45 (*)    All other components within normal limits  TROPONIN I  TROPONIN I  TROPONIN I  TSH  HEMOGLOBIN A1C  CBC  POCT I-STAT TROPONIN I  POCT I-STAT TROPONIN I   Imaging Review Dg Chest 2 View  02/07/2013   CLINICAL DATA:  Chest pain radiating to the back.  EXAM: CHEST  2 VIEW  COMPARISON:  12/01/2012  FINDINGS: Heart size is normal. Mediastinal shadows are normal. The lungs are clear. No effusions. No acute bony findings. Previous cervical  fusion.  IMPRESSION: No active cardiopulmonary disease.   Electronically Signed   By: Nelson Chimes M.D.   On: 02/07/2013 11:04   Nm Myocar Multi W/spect W/wall Motion / Ef  02/08/2013   CLINICAL DATA:  63 year old male with chest pain  EXAM: MYOCARDIAL IMAGING WITH SPECT (REST AND PHARMACOLOGIC-STRESS)  GATED LEFT VENTRICULAR WALL MOTION STUDY  LEFT VENTRICULAR EJECTION FRACTION  TECHNIQUE: Standard myocardial SPECT imaging was performed after resting intravenous injection of 10 mCi Tc-22m sestamibi. Subsequently, intravenous infusion of Lexiscan was performed under the supervision of the Cardiology staff. At peak effect of the drug, 30 mCi Tc-39m sestamibi was injected intravenously and standard myocardial SPECT imaging was performed. Quantitative gated imaging was also performed to evaluate left ventricular wall motion, and estimate left ventricular ejection fraction.  COMPARISON:  Chest x-ray 02/07/2013; prior CT chest 08/13/2011  FINDINGS: Evaluation of the cardiac a gated data demonstrates an end-diastolic volume of 69 mL and end systolic volume of 25 mL yielding a calculated ejection fraction of 64%. There is no evidence of focal or global cardiac wall motion abnormality.  Evaluation of the static resting and post pharmacological stress images demonstrate normal and symmetric radiotracer uptake throughout the ventricular myocardium. No fixed or reversible perfusion defect.  IMPRESSION: 1. No evidence for inducible/reversible ischemia. 2. Normal cardiac wall motion. 3. Calculated ejection fraction 64%.   Electronically Signed   By: Jacqulynn Cadet M.D.   On: 02/08/2013 14:11      MDM   1. Chest pain    He has a normal resting EKG. He has a normal first enzymes. His symptoms are concerning for angina. Another possible etiology would  be GI source with acid reflux, however he does take AcipHex twice a day which would make this much less likely. I discussed the case with cardiology service. The  patient will be seen and examined.  After cardiology evaluation plan is overnight monitoring for nuclear medicine imaging. He is remained symptom free in the emergency room    Tanna Furry, MD 02/09/13 1056

## 2013-02-14 ENCOUNTER — Telehealth: Payer: Self-pay | Admitting: Gastroenterology

## 2013-02-14 NOTE — Telephone Encounter (Signed)
The patient has been notified of this information and all questions answered.

## 2013-02-14 NOTE — Telephone Encounter (Signed)
Pt has apt on Feb 12 arrive at 10.00am for a 10.20am apt with Dr Rosendo Gros.Marland KitchenMarland KitchenThayer Headings

## 2013-02-22 ENCOUNTER — Telehealth: Payer: Self-pay | Admitting: Gastroenterology

## 2013-02-22 ENCOUNTER — Ambulatory Visit (INDEPENDENT_AMBULATORY_CARE_PROVIDER_SITE_OTHER): Payer: 59 | Admitting: General Surgery

## 2013-02-22 ENCOUNTER — Encounter (INDEPENDENT_AMBULATORY_CARE_PROVIDER_SITE_OTHER): Payer: Self-pay | Admitting: General Surgery

## 2013-02-22 VITALS — BP 124/76 | HR 78 | Resp 16 | Ht 70.0 in | Wt 196.2 lb

## 2013-02-22 DIAGNOSIS — K220 Achalasia of cardia: Secondary | ICD-10-CM

## 2013-02-22 DIAGNOSIS — K219 Gastro-esophageal reflux disease without esophagitis: Secondary | ICD-10-CM

## 2013-02-22 NOTE — Progress Notes (Signed)
Patient ID: Steven [name], male   DOB: 1950/07/02, 63 y.o.   MRN: [id number]  No chief complaint on file.   HPI Steven [name] is a 63 y.o. male.  The patient is a 63 year old male who is referred by Dr. 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 The patient states that approximately 10 years ago he began with epigastric/esophageal/chest pain. The patient's been seen by Dr. Ardis Hughs and Dr. Watt Climes in the past and has had approximately 4 esophageal dilations and 2 Botox injections. The patient also underwent manometry studies in March of 2012 which revealed normal esophageal motility and increased lower esophageal sphincter tone as per Dr. Ardis Hughs note.  I do not have the results  The patient states that he continues with reflux as well as chest pain. The patient recently was in the ER workup for cardiac issues which was negative on workup.  The patient states that his pain is substernal and occurs at night.  He still has issues with reflux at this time.  The patient has no inciting or relieving factors. He does state he is able to swallow both liquids and solids in the pain occurs several hours after that.  HPI  Past Medical History  Diagnosis Date  . Intestinal infection due to other organism, not elsewhere classified   . Personal history of venous thrombosis and embolism   . Hyperlipemia   . Personal history of urinary calculi   . Esophageal reflux   . Hypertrophy (benign) of prostate   . Herpes simplex without mention of complication   . Displacement of intervertebral disc, site unspecified, without myelopathy   . Personal history of colonic polyps   . Hiatal hernia   . Kidney stones     sees Dr. Junious Silk  . Headache(784.0)     sinus and migraines  . HTN (hypertension) 05/10/2012  . Clotting disorder     Past Surgical History  Procedure Laterality Date  . Appendectomy    . Tonsillectomy    . Lithotripsy    . Gallbladder surgery    . Finger surgery      3rd finger trigger bilateral   .  Cervical disc surgery  1996 and 1998  . Esophagogastroduodenoscopy  02-24-10    per Dr. Ardis Hughs, small hiatal hernia only   . Colonoscopy  02-24-10    per Dr. Ardis Hughs, repeat in 5 yrs   . Ureteral stent placement  July 2012    left ureter, per Dr. Junious Silk, for a stone   . Rotator cuff repair Right 01-10-12    per Dr. Esmond Plants, also repair torn biceps tendon   . Cholecystectomy    . Esophagogastroduodenoscopy (egd) with esophageal dilation N/A 04/13/2012    Procedure: ESOPHAGOGASTRODUODENOSCOPY (EGD) WITH ESOPHAGEAL DILATION;  Surgeon: Milus Banister, MD;  Location: [facility];  Service: Endoscopy;  Laterality: N/A;  possible botox injection  . Botox injection N/A 04/13/2012    Procedure: BOTOX INJECTION;  Surgeon: Milus Banister, MD;  Location: [facility];  Service: Endoscopy;  Laterality: N/A;    Family History  Problem Relation Age of Onset  . Colon cancer Father   . Heart attack Father   . Heart disease Brother   . Heart disease Brother     Social History History  Substance Use Topics  . Smoking status: Former Smoker -- 1.50 packs/day for 20 years    Types: Cigarettes    Quit date: 03/03/1973  . Smokeless tobacco: Never Used  . Alcohol Use: No  Allergies  Allergen Reactions  . Amitriptyline Hcl Other (See Comments)    Mood changes  . Amoxicillin-Pot Clavulanate     REACTION: nausea  . Cyclobenzaprine Hcl     unknown  . Glycopyrrolate     unknown  . Lisinopril-Hydrochlorothiazide     depression  . Morphine     headache  . Prednisone Other (See Comments)    Blurred vision    Current Outpatient Prescriptions  Medication Sig Dispense Refill  . amLODipine (NORVASC) 5 MG tablet Take 1 tablet (5 mg total) by mouth daily.  90 tablet  3  . aspirin EC 81 MG tablet Take 81 mg by mouth daily.      Marland Kitchen eletriptan (RELPAX) 20 MG tablet Take 1 tablet (20 mg total) by mouth as needed for migraine.  30 tablet  1  . ezetimibe (ZETIA) 10 MG tablet Take 10 mg by mouth  every evening.      . fenofibrate (TRICOR) 145 MG tablet Take 145 mg by mouth every evening.      . folic acid (FOLVITE) 1 MG tablet Take 1 mg by mouth daily.      . RABEprazole (ACIPHEX) 20 MG tablet Take 20 mg by mouth 2 (two) times daily.      . Diphenhyd-Hydrocort-Nystatin (FIRST-DUKES MOUTHWASH) SUSP Use as directed 5 mLs in the mouth or throat every 6 (six) hours as needed (ulcers). Swish and spit      . temazepam (RESTORIL) 30 MG capsule Take 30 mg by mouth at bedtime as needed for sleep or anxiety.       . valACYclovir (VALTREX) 500 MG tablet Take 500 mg by mouth 2 (two) times daily as needed (herpes).       No current facility-administered medications for this visit.    Review of Systems Review of Systems  Constitutional: Negative.   HENT: Negative.   Eyes: Negative.   Respiratory: Negative.   Cardiovascular: Negative.   Gastrointestinal: Negative.   Endocrine: Negative.   Neurological: Negative.     Blood pressure 124/76, pulse 78, resp. rate 16, height 5\' 10"  (1.778 m), weight 196 lb 3.2 oz (88.996 kg).  Physical Exam Physical Exam  Constitutional: He is oriented to person, place, and time. He appears well-developed and well-nourished.  HENT:  Head: Normocephalic and atraumatic.  Eyes: Conjunctivae and EOM are normal. Pupils are equal, round, and reactive to light.  Neck: Normal range of motion. Neck supple.  Cardiovascular: Normal rate, regular rhythm and normal heart sounds.   Pulmonary/Chest: Effort normal and breath sounds normal.  Abdominal: Soft. Bowel sounds are normal. He exhibits no distension and no mass. There is no tenderness. There is no rebound and no guarding.  Musculoskeletal: Normal range of motion.  Neurological: He is alert and oriented to person, place, and time.  Skin: Skin is warm and dry.    Data Reviewed As per Dr. Ardis Hughs notes manometry revealed normal esophageal motility and increased lower esophageal sphincter  The patient's undergone  esophageal dilations x4, Botox injections x2  Assessment    63 year old male with likely esophageal dysmotility, possible reflux and hiatal hernia.     Plan    1. I would like the patient to undergo a repeat manometry study, To reassess his esophageal motility as well as any other possibility such as nutcracker esophagus. 2. I would like the patient to undergo an esophagogram as well as gastric emptying study to evaluate for hiatal hernia as well as possible gastric outlet obstruction. 3. After  the patient has had these tests will have him follow back up, likely 2- 3 weeks, to discuss the results as well as any surgical intervention at that time.       Rosario Jacks., Minervia Osso 02/22/2013, 10:38 AM

## 2013-02-23 NOTE — Telephone Encounter (Signed)
You have been scheduled for an esophageal manometry at [facility] on 03/12/13 at 930 am. Please arrive 30 minutes prior to your procedure for registration. You will need to go to outpatient registration (1st floor of the hospital) first. Make certain to bring your insurance cards as well as a complete list of medications.  Please remember the following:  1) Nothing to eat or drink after 12:00 midnight on the night before your test.  2) Hold all diabetic medications/insulin the morning of the test. You may eat and take  your medications after the test.  3) For 3 days prior to your test do not take: Dexilant, Prevacid, Nexium, Protonix,  Aciphex, Zegerid, Pantoprazole, Prilosec or omeprazole.  4) For 2 days prior to your test, do not take: Reglan, Tagamet, Zantac, Axid or Pepcid.  Pt has been instructed   5) You MAY use an antacid such as Rolaids or Tums up to 12 hours prior to your test.  It will take at least 2 weeks to receive the results of this test from your physician. ------------------------------------------ ABOUT ESOPHAGEAL MANOMETRY Esophageal manometry (muh-NOM-uh-tree) is a test that gauges how well your esophagus works. Your esophagus is the long, muscular tube that connects your throat to your stomach. Esophageal manometry measures the rhythmic muscle contractions (peristalsis) that occur in your esophagus when you swallow. Esophageal manometry also measures the coordination and force exerted by the muscles of your esophagus.  During esophageal manometry, a thin, flexible tube (catheter) that contains sensors is passed through your nose, down your esophagus and into your stomach. Esophageal manometry can be helpful in diagnosing some mostly uncommon disorders that affect your esophagus.  Why it's done Esophageal manometry is used to evaluate the movement (motility) of food through the esophagus and into the stomach. The test measures how well the circular bands of  muscle (sphincters) at the top and bottom of your esophagus open and close, as well as the pressure, strength and pattern of the wave of esophageal muscle contractions that moves food along.  What you can expect Esophageal manometry is an outpatient procedure done without sedation. Most people tolerate it well. You may be asked to change into a hospital gown before the test starts.  During esophageal manometry  While you are sitting up, a member of your health care team sprays your throat with a numbing medication or puts numbing gel in your nose or both.  A catheter is guided through your nose into your esophagus. The catheter may be sheathed in a water-filled sleeve. It doesn't interfere with your breathing. However, your eyes may water, and you may gag. You may have a slight nosebleed from irritation.  After the catheter is in place, you may be asked to lie on your back on an exam table, or you may be asked to remain seated.  You then swallow small sips of water. As you do, a computer connected to the catheter records the pressure, strength and pattern of your esophageal muscle contractions.  During the test, you'll be asked to breathe slowly and smoothly, remain as still as possible, and swallow only when you're asked to do so.  A member of your health care team may move the catheter down into your stomach while the catheter continues its measurements.  The catheter then is slowly withdrawn. The test usually lasts 20 to 30 minutes.  After esophageal manometry  When your esophageal manometry is complete, you may return to your normal activities  This test typically  takes 30-45 minutes to complete. ________________________________________________________________________________

## 2013-02-23 NOTE — Telephone Encounter (Signed)
Is it ok to schedule?

## 2013-02-23 NOTE — Telephone Encounter (Signed)
OK, I read a note from his surgeon as well from yesterday asking for repeat esophageal manometry.  Can we schedule him for hi resolution esophageal manometry for dysphagia, check for achalasia. Thanks

## 2013-02-26 ENCOUNTER — Encounter (HOSPITAL_COMMUNITY)
Admission: RE | Disposition: A | Discharge status: Home / Self Care | Payer: Self-pay | Source: Ambulatory Visit | Attending: Gastroenterology

## 2013-02-26 ENCOUNTER — Ambulatory Visit (HOSPITAL_COMMUNITY)
Admission: RE | Admit: 2013-02-26 | Discharge: 2013-02-26 | Disposition: A | Discharge status: Home / Self Care | Payer: 59 | Source: Ambulatory Visit | Attending: Gastroenterology | Admitting: Gastroenterology

## 2013-02-26 DIAGNOSIS — I10 Essential (primary) hypertension: Secondary | ICD-10-CM | POA: Insufficient documentation

## 2013-02-26 DIAGNOSIS — R12 Heartburn: Secondary | ICD-10-CM | POA: Insufficient documentation

## 2013-02-26 DIAGNOSIS — E785 Hyperlipidemia, unspecified: Secondary | ICD-10-CM | POA: Insufficient documentation

## 2013-02-26 DIAGNOSIS — Z7982 Long term (current) use of aspirin: Secondary | ICD-10-CM | POA: Insufficient documentation

## 2013-02-26 DIAGNOSIS — K220 Achalasia of cardia: Secondary | ICD-10-CM | POA: Insufficient documentation

## 2013-02-26 DIAGNOSIS — Z79899 Other long term (current) drug therapy: Secondary | ICD-10-CM | POA: Insufficient documentation

## 2013-02-26 DIAGNOSIS — Z87891 Personal history of nicotine dependence: Secondary | ICD-10-CM | POA: Insufficient documentation

## 2013-02-26 DIAGNOSIS — Z9089 Acquired absence of other organs: Secondary | ICD-10-CM | POA: Insufficient documentation

## 2013-02-26 HISTORY — PX: ESOPHAGEAL MANOMETRY: SHX5429

## 2013-02-26 SURGERY — MANOMETRY, ESOPHAGUS
Anesthesia: LOCAL

## 2013-02-26 MED ORDER — LIDOCAINE VISCOUS 2 % MT SOLN
OROMUCOSAL | Status: AC
  Filled 2013-02-26: qty 15

## 2013-02-26 SURGICAL SUPPLY — 1 items: FACESHIELD LNG OPTICON STERILE (SAFETY) IMPLANT

## 2013-02-27 ENCOUNTER — Encounter (HOSPITAL_COMMUNITY): Payer: Self-pay | Admitting: Gastroenterology

## 2013-02-27 ENCOUNTER — Encounter: Payer: Self-pay | Admitting: Gastroenterology

## 2013-03-02 ENCOUNTER — Telehealth (INDEPENDENT_AMBULATORY_CARE_PROVIDER_SITE_OTHER): Payer: Self-pay

## 2013-03-02 NOTE — Telephone Encounter (Signed)
Called and left message for patient to call our office.  Patient Gastric Emptying Study has been scheduled for 03/07/13 @ 11:00am [facility].  Patient need's to arrive at 10:45 am, NPO after midnight.

## 2013-03-05 ENCOUNTER — Telehealth (INDEPENDENT_AMBULATORY_CARE_PROVIDER_SITE_OTHER): Payer: Self-pay | Admitting: *Deleted

## 2013-03-05 NOTE — Telephone Encounter (Signed)
I spoke with pt and informed him of the appt for his esophagogram at [facility] on 03/09/13 with an arrival time of 11:15am.  He is agreeable with this appt.  Pt may eat and drink as usual, NO restrictions.

## 2013-03-07 ENCOUNTER — Encounter (HOSPITAL_COMMUNITY)
Admission: RE | Admit: 2013-03-07 | Discharge: 2013-03-07 | Disposition: A | Discharge status: Home / Self Care | Payer: 59 | Source: Ambulatory Visit | Attending: General Surgery | Admitting: General Surgery

## 2013-03-07 DIAGNOSIS — K219 Gastro-esophageal reflux disease without esophagitis: Secondary | ICD-10-CM | POA: Insufficient documentation

## 2013-03-07 DIAGNOSIS — K220 Achalasia of cardia: Secondary | ICD-10-CM | POA: Insufficient documentation

## 2013-03-07 MED ORDER — TECHNETIUM TC 99M SULFUR COLLOID
2.0000 | Freq: Once | INTRAVENOUS | Status: AC | PRN
  Administered 2013-03-07: 2 via ORAL

## 2013-03-09 ENCOUNTER — Ambulatory Visit
Admission: RE | Admit: 2013-03-09 | Discharge: 2013-03-09 | Disposition: A | Discharge status: Home / Self Care | Payer: 59 | Source: Ambulatory Visit | Attending: General Surgery | Admitting: General Surgery

## 2013-03-09 DIAGNOSIS — K220 Achalasia of cardia: Secondary | ICD-10-CM

## 2013-03-12 ENCOUNTER — Telehealth (INDEPENDENT_AMBULATORY_CARE_PROVIDER_SITE_OTHER): Payer: Self-pay

## 2013-03-12 NOTE — Telephone Encounter (Signed)
Called and left message for patient to call our office regarding appointment scheduled for 03/15/13.  Patient need's to come in at 10:40 or 10:50 am on 03/15/13 due to a change in Dr. Johney Frame schedule.

## 2013-03-12 NOTE — Telephone Encounter (Signed)
Patient called back and is willing to come in at 1040a.  Appt changed at this time.

## 2013-03-15 ENCOUNTER — Encounter (INDEPENDENT_AMBULATORY_CARE_PROVIDER_SITE_OTHER): Payer: Self-pay | Admitting: General Surgery

## 2013-03-15 ENCOUNTER — Ambulatory Visit (INDEPENDENT_AMBULATORY_CARE_PROVIDER_SITE_OTHER): Payer: 59 | Admitting: General Surgery

## 2013-03-15 VITALS — BP 118/80 | HR 80 | Temp 98.9°F | Resp 16 | Ht 70.5 in | Wt 197.2 lb

## 2013-03-15 DIAGNOSIS — R1310 Dysphagia, unspecified: Secondary | ICD-10-CM

## 2013-03-15 NOTE — Progress Notes (Signed)
Subjective:     Patient ID: Steven [name], male   DOB: [birth year], 63 y.o.   MRN: [id number]  HPI The patient is a 63 year old male who recently saw secondary to dysphasia and possible achalasia. The patient underwent repeat manometry which is currently pending for final read. 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 Patient also with esophagogram which reveals hernia with some nonspecific esophageal motility issues. The patient also underwent a gastric intensity which was normal.  The patient does state that he continues with chest pain he states is mainly at nighttime. He states he has no chest pain with swallowing. The patient still has reflux as been an issue for him. There is an episode of reflux on esophagogram as well.  Review of Systems  Constitutional: Negative.   HENT: Negative.   Eyes: Negative.   Respiratory: Negative.   Cardiovascular: Negative.   Gastrointestinal: Negative.   Endocrine: Negative.   Neurological: Negative.        Objective:   Physical Exam  Constitutional: He is oriented to person, place, and time. He appears well-developed and well-nourished.  HENT:  Head: Normocephalic and atraumatic.  Eyes: Conjunctivae and EOM are normal. Pupils are equal, round, and reactive to light.  Neck: Normal range of motion. Neck supple.  Cardiovascular: Normal rate, regular rhythm and normal heart sounds.   Pulmonary/Chest: Effort normal and breath sounds normal.  Abdominal: Soft. Bowel sounds are normal. He exhibits no distension and no mass. There is no tenderness. There is no rebound and no guarding.  Musculoskeletal: Normal range of motion.  Neurological: He is alert and oriented to person, place, and time.  Skin: Skin is warm and dry.       Assessment:     63 year old male with dysphagia. 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 Based on his esophagram the patient does have a small hiatal hernia  which could also because of his chest pain as well as his reflux/regurgitation. Long discussion with the patient in regards to possible procedures of achalasia, nutcracker esophagus, and her hiatal hernia in the surgery to fix use. Once the final result of the manometry or pain I will call the patient to discuss the possibilities in regards to surgical repair his pathology.    Plan:     1. Once results are obtained no return the patient to call to discuss any further surgical treatment.

## 2013-03-20 ENCOUNTER — Telehealth (INDEPENDENT_AMBULATORY_CARE_PROVIDER_SITE_OTHER): Payer: Self-pay

## 2013-03-20 NOTE — Telephone Encounter (Signed)
Called and spoke to patient regarding recent Manometry with Dr. Ardis Hughs.  Per Dr. Rosendo Gros patient's Manometry results are Normal and at this time they do not feel that surgery is needed.  Patient will follow up with Dr. Ardis Hughs and we will contact patient with further details after patient meets with Dr. Ardis Hughs.  Patient verbalized understanding at this time and is agreeable with the plan at this time.

## 2013-03-22 ENCOUNTER — Telehealth: Payer: Self-pay | Admitting: Gastroenterology

## 2013-03-23 NOTE — Telephone Encounter (Signed)
Pt has been given an appt for 03/27/13 to see Dr Ardis Hughs

## 2013-03-23 NOTE — Telephone Encounter (Signed)
Pt is already scheduled for manometry per Dr Ardis Hughs Left message on machine to call back

## 2013-03-27 ENCOUNTER — Ambulatory Visit (INDEPENDENT_AMBULATORY_CARE_PROVIDER_SITE_OTHER): Payer: 59 | Admitting: Gastroenterology

## 2013-03-27 ENCOUNTER — Encounter: Payer: Self-pay | Admitting: Gastroenterology

## 2013-03-27 VITALS — BP 110/70 | HR 80 | Ht 69.25 in | Wt 198.1 lb

## 2013-03-27 DIAGNOSIS — K219 Gastro-esophageal reflux disease without esophagitis: Secondary | ICD-10-CM

## 2013-03-27 NOTE — Patient Instructions (Signed)
You should change the way you are taking your antiacid medicine (aciphex) so that you are taking it 20-30 minutes prior to a decent meal as that is the way the pill is designed to work most effectively. Take one pill before BF and one pill before dinner. Call Dr. Ardis Hughs office in 6-7 weeks to report on your response to these changes. Avoiding chocolate peppermint would probably help your reflux.

## 2013-03-27 NOTE — Progress Notes (Signed)
Review of pertinent gastrointestinal problems:  1. family history of colon cancer (father). Colonoscopy February 2012 found hyperplastic polyp, recall colonoscopy at 5 year interval  2. ? Esophageal dysmotility, achalasia? (unlikely). Botox injection by previous gastroenterologist [place]. EGD February 2012 suggested a snug E. junction, esophageal manometry March, 2012 showed normal peristalsis but high residual lower esophageal sphincter pressure. Previoulsy was having dysphagia (around 2000 or so). Botox injection by [facility] helped, but caused worse reflux problems. 04/2012 EGD Ardis Hughs with botox injection again helped his symptoms.  High Res esophageal manometry 02/2013 was essentially normal; no sign of achalasia   HPI: This is a  very pleasant 63 year old man who is here with his wife today.  Feels pretty good.  He still has chest pains at times.  Not always after he eats.  Food is not hanging or catching.  Takes aciphex in AM and PM.     Chest pains can be anytime of the day.  Not as severe as when he went to hosp.   Past Medical History  Diagnosis Date  . Intestinal infection due to other organism, not elsewhere classified   . Personal history of venous thrombosis and embolism   . Hyperlipemia   . Personal history of urinary calculi   . Esophageal reflux   . Hypertrophy (benign) of prostate   . Herpes simplex without mention of complication   . Displacement of intervertebral disc, site unspecified, without myelopathy   . Personal history of colonic polyps   . Hiatal hernia   . Kidney stones     sees Dr. Junious Silk  . Headache(784.0)     sinus and migraines  . HTN (hypertension) 05/10/2012  . Clotting disorder     Past Surgical History  Procedure Laterality Date  . Appendectomy    . Tonsillectomy    . Lithotripsy    . Gallbladder surgery    . Finger surgery      3rd finger trigger bilateral   . Cervical disc surgery  1996 and 1998  . Esophagogastroduodenoscopy  02-24-10    per  Dr. Ardis Hughs, small hiatal hernia only   . Colonoscopy  02-24-10    per Dr. Ardis Hughs, repeat in 5 yrs   . Ureteral stent placement  July 2012    left ureter, per Dr. Junious Silk, for a stone   . Rotator cuff repair Right 01-10-12    per Dr. Esmond Plants, also repair torn biceps tendon   . Cholecystectomy    . Esophagogastroduodenoscopy (egd) with esophageal dilation N/A 04/13/2012    Procedure: ESOPHAGOGASTRODUODENOSCOPY (EGD) WITH ESOPHAGEAL DILATION;  Surgeon: Milus Banister, MD;  Location: [facility];  Service: Endoscopy;  Laterality: N/A;  possible botox injection  . Botox injection N/A 04/13/2012    Procedure: BOTOX INJECTION;  Surgeon: Milus Banister, MD;  Location: [facility];  Service: Endoscopy;  Laterality: N/A;  . Esophageal manometry N/A 02/26/2013    Procedure: ESOPHAGEAL MANOMETRY (EM);  Surgeon: Milus Banister, MD;  Location: [facility];  Service: Endoscopy;  Laterality: N/A;    Current Outpatient Prescriptions  Medication Sig Dispense Refill  . amLODipine (NORVASC) 5 MG tablet Take 1 tablet (5 mg total) by mouth daily.  90 tablet  3  . aspirin EC 81 MG tablet Take 81 mg by mouth daily.      . Diphenhyd-Hydrocort-Nystatin (FIRST-DUKES MOUTHWASH) SUSP Use as directed 5 mLs in the mouth or throat every 6 (six) hours as needed (ulcers). Swish and spit      . eletriptan (  RELPAX) 20 MG tablet Take 1 tablet (20 mg total) by mouth as needed for migraine.  30 tablet  1  . ezetimibe (ZETIA) 10 MG tablet Take 10 mg by mouth every evening.      . fenofibrate (TRICOR) 145 MG tablet Take 145 mg by mouth every evening.      . folic acid (FOLVITE) 1 MG tablet Take 1 mg by mouth daily.      . RABEprazole (ACIPHEX) 20 MG tablet Take 20 mg by mouth 2 (two) times daily.      . temazepam (RESTORIL) 30 MG capsule Take 30 mg by mouth at bedtime as needed for sleep or anxiety.       . valACYclovir (VALTREX) 500 MG tablet Take 500 mg by mouth 2 (two) times daily as needed (herpes).       No current  facility-administered medications for this visit.    Allergies as of 03/27/2013 - Review Complete 03/27/2013  Allergen Reaction Noted  . Amitriptyline hcl Other (See Comments)   . Amoxicillin-pot clavulanate  01/18/2007  . Cyclobenzaprine hcl  07/31/2008  . Glycopyrrolate    . Lisinopril-hydrochlorothiazide  05/24/2012  . Morphine    . Prednisone Other (See Comments)     Family History  Problem Relation Age of Onset  . Colon cancer Father   . Heart attack Father   . Heart disease Brother   . Heart disease Brother     History   Social History  . Marital Status: Married    Spouse Name: N/A    Number of Children: N/A  . Years of Education: N/A   Occupational History  . Retired    Social History Main Topics  . Smoking status: Former Smoker -- 1.50 packs/day for 20 years    Types: Cigarettes    Quit date: 03/03/1973  . Smokeless tobacco: Never Used  . Alcohol Use: No  . Drug Use: No  . Sexual Activity: Not on file   Other Topics Concern  . Not on file   Social History Narrative  . No narrative on file      Physical Exam: BP 110/70  Pulse 80  Ht 5' 9.25" (1.759 m)  Wt 198 lb 2 oz (89.869 kg)  BMI 29.05 kg/m2 Constitutional: generally well-appearing Psychiatric: alert and oriented x3 Abdomen: soft, nontender, nondistended, no obvious ascites, no peritoneal signs, normal bowel sounds     Assessment and plan: 63 y.o. male with GERD, small hiatal hernia, previous dysphasia  First of all his dysphagia has improved. It is not clear to me why he has responded so well to local achalasia therapies with Botox injection however manometrically he is not have any evidence of achalasia. He does seem to have some GERD troubles. Perhaps GERD is contributing to his intermittent chest pains. I don't think he has motility disorder causing his chest pains. He will continue taking proton pump inhibitor, will take at the correct time and relation to his meals. He asked about  "leaky gut syndrome" and I told him I don't have much knowledge her experience with that syndrome. He has been taking cows milk colostrum,  and tells me this helps with his blood pressure and wonders if it could help with his leaky gut. I explained to them that I don't have any experience or knowledge about that therapy.

## 2013-06-09 ENCOUNTER — Other Ambulatory Visit: Payer: Self-pay | Admitting: Family Medicine

## 2013-06-12 ENCOUNTER — Telehealth: Payer: Self-pay | Admitting: Family Medicine

## 2013-06-12 MED ORDER — ACYCLOVIR 5 % EX CREA: TOPICAL_CREAM | CUTANEOUS | Status: DC

## 2013-06-12 NOTE — Telephone Encounter (Signed)
Per Dr. Sarajane Jews send in the cream. I resent script e-scribe and spoke with pt.

## 2013-06-12 NOTE — Telephone Encounter (Signed)
Clarify dosage form for Zovirax 5 % either cream or ointment? Then resend to [facility].

## 2013-07-13 ENCOUNTER — Encounter (HOSPITAL_COMMUNITY): Payer: Self-pay | Admitting: Pharmacy Technician

## 2013-07-14 ENCOUNTER — Other Ambulatory Visit: Payer: Self-pay | Admitting: Family Medicine

## 2013-07-16 ENCOUNTER — Ambulatory Visit (INDEPENDENT_AMBULATORY_CARE_PROVIDER_SITE_OTHER): Payer: 59 | Admitting: Physician Assistant

## 2013-07-16 ENCOUNTER — Encounter: Payer: Self-pay | Admitting: Physician Assistant

## 2013-07-16 VITALS — BP 110/72 | HR 70 | Temp 97.9°F | Resp 18 | Wt 198.0 lb

## 2013-07-16 DIAGNOSIS — J069 Acute upper respiratory infection, unspecified: Secondary | ICD-10-CM

## 2013-07-16 MED ORDER — HYDROCODONE-HOMATROPINE 5-1.5 MG/5ML PO SYRP: 5.0000 mL | ORAL_SOLUTION | Freq: Three times a day (TID) | ORAL | Status: DC | PRN

## 2013-07-16 NOTE — Patient Instructions (Signed)
Hycodan every 8 hours as needed for cough. Do not drive while taking this medication as it will in your ability to operate a motor vehicle.  Plain Over the Counter Mucinex (NOT Mucinex D) for thick secretions  Force NON dairy fluids, drinking plenty of water is best.    Over the Counter Flonase OR Nasacort AQ 1 spray in each nostril twice a day as needed. Use the "crossover" technique into opposite nostril spraying toward opposite ear @ 45 degree angle, not straight up into nostril.   Plain Over the Counter Allegra (NOT D )  160 daily , OR Loratidine 10 mg , OR Zyrtec 10 mg @ bedtime  as needed for itchy eyes & sneezing.  Saline Irrigation and Saline Sprays can also help reduce symptoms.  If emergency symptoms discussed during visit developed, seek medical attention immediately.  Followup as needed, or for worsening or persistent symptoms despite treatment.    Upper Respiratory Infection, Adult An upper respiratory infection (URI) is also known as the common cold. It is often caused by a type of germ (virus). Colds are easily spread (contagious). You can pass it to others by kissing, coughing, sneezing, or drinking out of the same glass. Usually, you get better in 1 or 2 weeks.  HOME CARE   Only take medicine as told by your doctor.  Use a warm mist humidifier or breathe in steam from a hot shower.  Drink enough water and fluids to keep your pee (urine) clear or pale yellow.  Get plenty of rest.  Return to work when your temperature is back to normal or as told by your doctor. You may use a face mask and wash your hands to stop your cold from spreading. GET HELP RIGHT AWAY IF:   After the first few days, you feel you are getting worse.  You have questions about your medicine.  You have chills, shortness of breath, or brown or red spit (mucus).  You have yellow or brown snot (nasal discharge) or pain in the face, especially when you bend forward.  You have a fever, puffy  (swollen) neck, pain when you swallow, or white spots in the back of your throat.  You have a bad headache, ear pain, sinus pain, or chest pain.  You have a high-pitched whistling sound when you breathe in and out (wheezing).  You have a lasting cough or cough up blood.  You have sore muscles or a stiff neck. MAKE SURE YOU:   Understand these instructions.  Will watch your condition.  Will get help right away if you are not doing well or get worse. Document Released: 06/16/2007 Document Revised: 03/22/2011 Document Reviewed: 05/04/2010 [facility] Patient Information 2015 [place], [place]. This information is not intended to replace advice given to you by your health care provider. Make sure you discuss any questions you have with your health care provider.

## 2013-07-16 NOTE — Progress Notes (Signed)
Subjective:    Patient ID: Steven [name], male    DOB: June 12, 1950, 63 y.o.   MRN: [id number]  URI  This is a new problem. The current episode started in the past 7 days (3 days). The problem has been unchanged. There has been no fever. Associated symptoms include congestion, coughing (thick green sputum), headaches, rhinorrhea, sinus pain and a sore throat. Pertinent negatives include no abdominal pain, chest pain, diarrhea, dysuria, ear pain, joint pain, joint swelling, nausea, neck pain, plugged ear sensation, rash, sneezing, swollen glands, vomiting or wheezing. Treatments tried: allegra, mucinex, gargles with magic mouthwash for mouth ulcers. The treatment provided mild relief.  No history of COPD.    Review of Systems  Constitutional: Negative for fever and chills.  HENT: Positive for congestion, postnasal drip, rhinorrhea, sinus pressure and sore throat. Negative for ear discharge, ear pain and sneezing.   Respiratory: Positive for cough (thick green sputum). Negative for shortness of breath and wheezing.   Cardiovascular: Negative for chest pain.  Gastrointestinal: Negative for nausea, vomiting, abdominal pain and diarrhea.  Genitourinary: Negative for dysuria.  Musculoskeletal: Negative for joint pain and neck pain.  Skin: Negative for rash.  Neurological: Positive for headaches.  All other systems reviewed and are negative.    Past Medical History  Diagnosis Date  . Intestinal infection due to other organism, not elsewhere classified   . Personal history of venous thrombosis and embolism   . Hyperlipemia   . Personal history of urinary calculi   . Esophageal reflux   . Hypertrophy (benign) of prostate   . Herpes simplex without mention of complication   . Displacement of intervertebral disc, site unspecified, without myelopathy   . Personal history of colonic polyps   . Hiatal hernia   . Kidney stones     sees Dr. Junious Silk  . Headache(784.0)     sinus and migraines    . HTN (hypertension) 05/10/2012  . Clotting disorder     History   Social History  . Marital Status: Married    Spouse Name: N/A    Number of Children: N/A  . Years of Education: N/A   Occupational History  . Retired    Social History Main Topics  . Smoking status: Former Smoker -- 1.50 packs/day for 20 years    Types: Cigarettes    Quit date: 03/03/1973  . Smokeless tobacco: Never Used  . Alcohol Use: No  . Drug Use: No  . Sexual Activity: Not on file   Other Topics Concern  . Not on file   Social History Narrative  . No narrative on file    Past Surgical History  Procedure Laterality Date  . Appendectomy    . Tonsillectomy    . Lithotripsy    . Gallbladder surgery    . Finger surgery      3rd finger trigger bilateral   . Cervical disc surgery  1996 and 1998  . Esophagogastroduodenoscopy  02-24-10    per Dr. Ardis Hughs, small hiatal hernia only   . Colonoscopy  02-24-10    per Dr. Ardis Hughs, repeat in 5 yrs   . Ureteral stent placement  July 2012    left ureter, per Dr. Junious Silk, for a stone   . Rotator cuff repair Right 01-10-12    per Dr. Esmond Plants, also repair torn biceps tendon   . Cholecystectomy    . Esophagogastroduodenoscopy (egd) with esophageal dilation N/A 04/13/2012    Procedure: ESOPHAGOGASTRODUODENOSCOPY (EGD) WITH ESOPHAGEAL DILATION;  Surgeon: Milus Banister, MD;  Location: [facility];  Service: Endoscopy;  Laterality: N/A;  possible botox injection  . Botox injection N/A 04/13/2012    Procedure: BOTOX INJECTION;  Surgeon: Milus Banister, MD;  Location: [facility];  Service: Endoscopy;  Laterality: N/A;  . Esophageal manometry N/A 02/26/2013    Procedure: ESOPHAGEAL MANOMETRY (EM);  Surgeon: Milus Banister, MD;  Location: [facility];  Service: Endoscopy;  Laterality: N/A;    Family History  Problem Relation Age of Onset  . Colon cancer Father   . Heart attack Father   . Heart disease Brother   . Heart disease Brother     Allergies   Allergen Reactions  . Amitriptyline Hcl Other (See Comments)    Mood changes  . Amoxicillin-Pot Clavulanate     REACTION: nausea  . Cyclobenzaprine Hcl     unknown  . Glycopyrrolate     unknown  . Lisinopril-Hydrochlorothiazide     depression  . Morphine     headache  . Prednisone Other (See Comments)    Blurred vision    Current Outpatient Prescriptions on File Prior to Visit  Medication Sig Dispense Refill  . acyclovir cream (ZOVIRAX) 5 % Apply 1 application topically daily as needed (fever blisters).      Marland Kitchen amLODipine (NORVASC) 5 MG tablet Take 1 tablet (5 mg total) by mouth daily.  90 tablet  3  . aspirin EC 81 MG tablet Take 81 mg by mouth daily.      . Diphenhyd-Hydrocort-Nystatin (FIRST-DUKES MOUTHWASH) SUSP Use as directed 5 mLs in the mouth or throat every 6 (six) hours as needed (ulcers). Swish and spit      . eletriptan (RELPAX) 20 MG tablet Take 1 tablet (20 mg total) by mouth as needed for migraine.  30 tablet  1  . ezetimibe (ZETIA) 10 MG tablet Take 10 mg by mouth every evening.      . fenofibrate (TRICOR) 145 MG tablet Take 145 mg by mouth every evening.      . folic acid (FOLVITE) 1 MG tablet Take 1 mg by mouth daily.      . RABEprazole (ACIPHEX) 20 MG tablet Take 20 mg by mouth 2 (two) times daily.      . temazepam (RESTORIL) 30 MG capsule Take 30 mg by mouth at bedtime as needed for sleep.      . valACYclovir (VALTREX) 500 MG tablet Take 500 mg by mouth 2 (two) times daily as needed (herpes).       No current facility-administered medications on file prior to visit.    EXAM: BP 110/72  Pulse 70  Temp(Src) 97.9 F (36.6 C) (Oral)  Resp 18  Wt 198 lb (89.812 kg)  SpO2 98%     Objective:   Physical Exam  Nursing note and vitals reviewed. Constitutional: He is oriented to person, place, and time. He appears well-developed and well-nourished. No distress.  HENT:  Head: Normocephalic and atraumatic.  Right Ear: External ear normal.  Left Ear: External  ear normal.  Nose: Nose normal.  Mouth/Throat: No oropharyngeal exudate.  Oropharynx is slightly erythematous, no exudate. Bilateral TMs normal. Bilateral frontal and maxillary sinuses non-TTP.   Eyes: Conjunctivae and EOM are normal. Pupils are equal, round, and reactive to light.  Neck: Normal range of motion. Neck supple. No JVD present.  Cardiovascular: Normal rate, regular rhythm and intact distal pulses.   Pulmonary/Chest: Effort normal and breath sounds normal. No stridor. No respiratory distress. He has  no wheezes. He has no rales. He exhibits no tenderness.  Mild rhonchi bilateral lung bases.  Musculoskeletal: Normal range of motion.  Lymphadenopathy:    He has no cervical adenopathy.  Neurological: He is alert and oriented to person, place, and time.  Skin: Skin is warm and dry. No rash noted. He is not diaphoretic. No erythema. No pallor.  Psychiatric: He has a normal mood and affect. His behavior is normal. Judgment and thought content normal.    Lab Results  Component Value Date   WBC 6.4 02/07/2013   HGB 15.1 02/07/2013   HCT 41.9 02/07/2013   PLT 229 02/07/2013   GLUCOSE 95 02/08/2013   CHOL 176 02/08/2013   TRIG 227* 02/08/2013   HDL 32* 02/08/2013   LDLDIRECT 143.6 08/28/2012   LDLCALC 99 02/08/2013   ALT 36 08/28/2012   AST 33 08/28/2012   NA 141 02/08/2013   K 4.7 02/08/2013   CL 103 02/08/2013   CREATININE 1.32 02/08/2013   BUN 21 02/08/2013   CO2 24 02/08/2013   TSH 2.444 02/07/2013   PSA 0.62 08/28/2012   INR 3.5 09/05/2008   HGBA1C 5.6 02/07/2013         Assessment & Plan:  Rodricus was seen today for uri.  Diagnoses and associated orders for this visit:  URI (upper respiratory infection) Comments: with cough. Will rx hycodan. Force fluids, add nasal steroid to OTC meds. - HYDROcodone-homatropine (HYCODAN) 5-1.5 MG/5ML syrup; Take 5 mLs by mouth every 8 (eight) hours as needed for cough.    Reviewed, patient has allergy to morphine-headache. Patient states  that he has tolerated Vicodin and hydrocodone in the past without adverse effects. Will do trial of Hycodan for cough as needed.  Return precautions provided, and patient handout on URI.  Plan to follow up as needed, or for worsening or persistent symptoms despite treatment.  Patient Instructions  Hycodan every 8 hours as needed for cough. Do not drive while taking this medication as it will in your ability to operate a motor vehicle.  Plain Over the Counter Mucinex (NOT Mucinex D) for thick secretions  Force NON dairy fluids, drinking plenty of water is best.    Over the Counter Flonase OR Nasacort AQ 1 spray in each nostril twice a day as needed. Use the "crossover" technique into opposite nostril spraying toward opposite ear @ 45 degree angle, not straight up into nostril.   Plain Over the Counter Allegra (NOT D )  160 daily , OR Loratidine 10 mg , OR Zyrtec 10 mg @ bedtime  as needed for itchy eyes & sneezing.  Saline Irrigation and Saline Sprays can also help reduce symptoms.  If emergency symptoms discussed during visit developed, seek medical attention immediately.  Followup as needed, or for worsening or persistent symptoms despite treatment.

## 2013-07-17 NOTE — H&P (Signed)
Steven [name] is an 63 y.o. male.    Chief Complaint: right 4th finger triggering  HPI: Pt is a 63 y.o. male complaining of right 4th finger triggering for several months. Pain had continually increased since the beginning. Pt has tried various conservative treatments which have failed to alleviate their symptoms, including injections and splinting. Various options are discussed with the patient. Risks, benefits and expectations were discussed with the patient. Patient understand the risks, benefits and expectations and wishes to proceed with surgery.   PCP:  Laurey Morale, MD  D/C Plans:  Home   PMH: Past Medical History  Diagnosis Date  . Intestinal infection due to other organism, not elsewhere classified   . Personal history of venous thrombosis and embolism   . Hyperlipemia   . Personal history of urinary calculi   . Esophageal reflux   . Hypertrophy (benign) of prostate   . Herpes simplex without mention of complication   . Displacement of intervertebral disc, site unspecified, without myelopathy   . Personal history of colonic polyps   . Hiatal hernia   . Kidney stones     sees Dr. Junious Silk  . Headache(784.0)     sinus and migraines  . HTN (hypertension) 05/10/2012  . Clotting disorder     PSH: Past Surgical History  Procedure Laterality Date  . Appendectomy    . Tonsillectomy    . Lithotripsy    . Gallbladder surgery    . Finger surgery      3rd finger trigger bilateral   . Cervical disc surgery  1996 and 1998  . Esophagogastroduodenoscopy  02-24-10    per Dr. Ardis Hughs, small hiatal hernia only   . Colonoscopy  02-24-10    per Dr. Ardis Hughs, repeat in 5 yrs   . Ureteral stent placement  July 2012    left ureter, per Dr. Junious Silk, for a stone   . Rotator cuff repair Right 01-10-12    per Dr. Esmond Plants, also repair torn biceps tendon   . Cholecystectomy    . Esophagogastroduodenoscopy (egd) with esophageal dilation N/A 04/13/2012    Procedure:  ESOPHAGOGASTRODUODENOSCOPY (EGD) WITH ESOPHAGEAL DILATION;  Surgeon: Milus Banister, MD;  Location: [facility];  Service: Endoscopy;  Laterality: N/A;  possible botox injection  . Botox injection N/A 04/13/2012    Procedure: BOTOX INJECTION;  Surgeon: Milus Banister, MD;  Location: [facility];  Service: Endoscopy;  Laterality: N/A;  . Esophageal manometry N/A 02/26/2013    Procedure: ESOPHAGEAL MANOMETRY (EM);  Surgeon: Milus Banister, MD;  Location: [facility];  Service: Endoscopy;  Laterality: N/A;    Social History:  reports that he quit smoking about 40 years ago. His smoking use included Cigarettes. He has a 30 pack-year smoking history. He has never used smokeless tobacco. He reports that he does not drink alcohol or use illicit drugs.  Allergies:  Allergies  Allergen Reactions  . Amitriptyline Hcl Other (See Comments)    Mood changes  . Amoxicillin-Pot Clavulanate     REACTION: nausea  . Cyclobenzaprine Hcl     unknown  . Glycopyrrolate     unknown  . Lisinopril-Hydrochlorothiazide     depression  . Morphine     headache  . Prednisone Other (See Comments)    Blurred vision    Medications: No current facility-administered medications for this encounter.   Current Outpatient Prescriptions  Medication Sig Dispense Refill  . acyclovir cream (ZOVIRAX) 5 % Apply 1 application topically daily as needed (fever  blisters).      Marland Kitchen amLODipine (NORVASC) 5 MG tablet Take 1 tablet (5 mg total) by mouth daily.  90 tablet  3  . aspirin EC 81 MG tablet Take 81 mg by mouth daily.      . Diphenhyd-Hydrocort-Nystatin (FIRST-DUKES MOUTHWASH) SUSP Use as directed 5 mLs in the mouth or throat every 6 (six) hours as needed (ulcers). Swish and spit      . eletriptan (RELPAX) 20 MG tablet Take 1 tablet (20 mg total) by mouth as needed for migraine.  30 tablet  1  . ezetimibe (ZETIA) 10 MG tablet Take 10 mg by mouth every evening.      . fenofibrate (TRICOR) 145 MG tablet Take 145 mg by mouth  every evening.      . folic acid (FOLVITE) 1 MG tablet Take 1 mg by mouth daily.      . RABEprazole (ACIPHEX) 20 MG tablet Take 20 mg by mouth 2 (two) times daily.      . temazepam (RESTORIL) 30 MG capsule Take 30 mg by mouth at bedtime as needed for sleep.      . valACYclovir (VALTREX) 500 MG tablet Take 500 mg by mouth 2 (two) times daily as needed (herpes).      Marland Kitchen HYDROcodone-homatropine (HYCODAN) 5-1.5 MG/5ML syrup Take 5 mLs by mouth every 8 (eight) hours as needed for cough.  120 mL  0  . VALTREX 500 MG tablet TAKE 1 TABLET TWICE A DAY  AS NEEDED  180 tablet  0    No results found for this or any previous visit (from the past 48 hour(s)). No results found.  ROS: Pain with rom of the right hand   Physical Exam:  Alert and oriented 63 y.o. male in no acute distress Cranial nerves 2-12 intact Cervical spine: full rom with no tenderness, nv intact distally Chest: active breath sounds bilaterally, no wheeze rhonchi or rales Heart: regular rate and rhythm, no murmur Abd: non tender non distended with active bowel sounds Hip is stable with rom  Right 4th finger with mild pain to A1 pulley nv intact distally All flexor and extensors intact  Assessment/Plan Assessment: right 4th finger triggering   Plan: Patient will undergo a right 4th finger A1 pulley release by Dr. Veverly Fells at [facility]. Risks benefits and expectations were discussed with the patient. Patient understand risks, benefits and expectations and wishes to proceed.

## 2013-07-26 MED ORDER — CLINDAMYCIN PHOSPHATE 900 MG/50ML IV SOLN
900.0000 mg | INTRAVENOUS | Status: AC
  Administered 2013-07-27: 900 mg via INTRAVENOUS
  Filled 2013-07-26: qty 50

## 2013-07-27 ENCOUNTER — Ambulatory Visit (HOSPITAL_COMMUNITY): Payer: 59 | Admitting: Anesthesiology

## 2013-07-27 ENCOUNTER — Encounter (HOSPITAL_COMMUNITY)
Admission: RE | Disposition: A | Discharge status: Home / Self Care | Payer: Self-pay | Source: Ambulatory Visit | Attending: Orthopedic Surgery

## 2013-07-27 ENCOUNTER — Encounter (HOSPITAL_COMMUNITY): Payer: 59 | Admitting: Anesthesiology

## 2013-07-27 ENCOUNTER — Ambulatory Visit (HOSPITAL_COMMUNITY)
Admission: RE | Admit: 2013-07-27 | Discharge: 2013-07-27 | Disposition: A | Discharge status: Home / Self Care | Payer: 59 | Source: Ambulatory Visit | Attending: Orthopedic Surgery | Admitting: Orthopedic Surgery

## 2013-07-27 ENCOUNTER — Encounter (HOSPITAL_COMMUNITY): Payer: Self-pay | Admitting: *Deleted

## 2013-07-27 DIAGNOSIS — E785 Hyperlipidemia, unspecified: Secondary | ICD-10-CM | POA: Insufficient documentation

## 2013-07-27 DIAGNOSIS — K219 Gastro-esophageal reflux disease without esophagitis: Secondary | ICD-10-CM | POA: Insufficient documentation

## 2013-07-27 DIAGNOSIS — Z7982 Long term (current) use of aspirin: Secondary | ICD-10-CM | POA: Insufficient documentation

## 2013-07-27 DIAGNOSIS — M6530 Trigger finger, unspecified finger: Secondary | ICD-10-CM | POA: Insufficient documentation

## 2013-07-27 DIAGNOSIS — Z86718 Personal history of other venous thrombosis and embolism: Secondary | ICD-10-CM | POA: Insufficient documentation

## 2013-07-27 DIAGNOSIS — I10 Essential (primary) hypertension: Secondary | ICD-10-CM | POA: Insufficient documentation

## 2013-07-27 DIAGNOSIS — K449 Diaphragmatic hernia without obstruction or gangrene: Secondary | ICD-10-CM | POA: Insufficient documentation

## 2013-07-27 DIAGNOSIS — Z79899 Other long term (current) drug therapy: Secondary | ICD-10-CM | POA: Insufficient documentation

## 2013-07-27 DIAGNOSIS — Z87891 Personal history of nicotine dependence: Secondary | ICD-10-CM | POA: Insufficient documentation

## 2013-07-27 DIAGNOSIS — IMO0002 Reserved for concepts with insufficient information to code with codable children: Secondary | ICD-10-CM | POA: Insufficient documentation

## 2013-07-27 HISTORY — PX: STERIOD INJECTION: SHX5046

## 2013-07-27 HISTORY — PX: TRIGGER FINGER RELEASE: SHX641

## 2013-07-27 LAB — CBC
HEMATOCRIT: 42.9 % (ref 39.0–52.0)
HEMOGLOBIN: 14.7 g/dL (ref 13.0–17.0)
MCH: 28.8 pg (ref 26.0–34.0)
MCHC: 34.3 g/dL (ref 30.0–36.0)
MCV: 84 fL (ref 78.0–100.0)
Platelets: 264 10*3/uL (ref 150–400)
RBC: 5.11 MIL/uL (ref 4.22–5.81)
RDW: 12.8 % (ref 11.5–15.5)
WBC: 6.6 10*3/uL (ref 4.0–10.5)

## 2013-07-27 LAB — BASIC METABOLIC PANEL
Anion gap: 15 (ref 5–15)
BUN: 18 mg/dL (ref 6–23)
CO2: 25 meq/L (ref 19–32)
Calcium: 9.5 mg/dL (ref 8.4–10.5)
Chloride: 99 mEq/L (ref 96–112)
Creatinine, Ser: 1.24 mg/dL (ref 0.50–1.35)
GFR calc non Af Amer: 60 mL/min — ABNORMAL LOW (ref >90)
GFR, EST AFRICAN AMERICAN: 69 mL/min — AB (ref >90)
Glucose, Bld: 85 mg/dL (ref 70–99)
Potassium: 4.4 mEq/L (ref 3.7–5.3)
Sodium: 139 mEq/L (ref 137–147)

## 2013-07-27 LAB — PROTIME-INR
INR: 1.04 (ref 0.00–1.49)
Prothrombin Time: 13.6 seconds (ref 11.6–15.2)

## 2013-07-27 LAB — APTT: aPTT: 29 seconds (ref 24–37)

## 2013-07-27 SURGERY — MINOR RELEASE TRIGGER FINGER/A-1 PULLEY
Anesthesia: General | Site: Finger | Laterality: Right

## 2013-07-27 MED ORDER — LIDOCAINE HCL (PF) 1 % IJ SOLN
INTRAMUSCULAR | Status: DC | PRN
  Administered 2013-07-27: 6 mL

## 2013-07-27 MED ORDER — METOCLOPRAMIDE HCL 5 MG/ML IJ SOLN: 10.0000 mg | Freq: Once | INTRAMUSCULAR | Status: DC | PRN

## 2013-07-27 MED ORDER — FENTANYL CITRATE 0.05 MG/ML IJ SOLN
INTRAMUSCULAR | Status: DC | PRN
  Administered 2013-07-27 (×2): 50 ug via INTRAVENOUS

## 2013-07-27 MED ORDER — PROPOFOL 10 MG/ML IV BOLUS
INTRAVENOUS | Status: AC
  Filled 2013-07-27: qty 20

## 2013-07-27 MED ORDER — MIDAZOLAM HCL 5 MG/5ML IJ SOLN
INTRAMUSCULAR | Status: DC | PRN
  Administered 2013-07-27: 2 mg via INTRAVENOUS

## 2013-07-27 MED ORDER — METHYLPREDNISOLONE ACETATE 40 MG/ML IJ SUSP
INTRAMUSCULAR | Status: AC
  Filled 2013-07-27: qty 1

## 2013-07-27 MED ORDER — FENTANYL CITRATE 0.05 MG/ML IJ SOLN
INTRAMUSCULAR | Status: AC
  Filled 2013-07-27: qty 5

## 2013-07-27 MED ORDER — 0.9 % SODIUM CHLORIDE (POUR BTL) OPTIME
TOPICAL | Status: DC | PRN
  Administered 2013-07-27: 1000 mL

## 2013-07-27 MED ORDER — MIDAZOLAM HCL 2 MG/2ML IJ SOLN
INTRAMUSCULAR | Status: AC
  Filled 2013-07-27: qty 2

## 2013-07-27 MED ORDER — LACTATED RINGERS IV SOLN
INTRAVENOUS | Status: DC
  Administered 2013-07-27: 13:00:00 via INTRAVENOUS

## 2013-07-27 MED ORDER — LIDOCAINE HCL (PF) 1 % IJ SOLN
INTRAMUSCULAR | Status: AC
  Filled 2013-07-27: qty 30

## 2013-07-27 MED ORDER — OXYCODONE-ACETAMINOPHEN 5-325 MG PO TABS: 1.0000 | ORAL_TABLET | ORAL | Status: DC | PRN

## 2013-07-27 MED ORDER — PROPOFOL INFUSION 10 MG/ML OPTIME
INTRAVENOUS | Status: DC | PRN
  Administered 2013-07-27: 100 ug/kg/min via INTRAVENOUS

## 2013-07-27 MED ORDER — PROPOFOL INFUSION 10 MG/ML OPTIME: INTRAVENOUS | Status: DC | PRN

## 2013-07-27 MED ORDER — CHLORHEXIDINE GLUCONATE 4 % EX LIQD
60.0000 mL | Freq: Once | CUTANEOUS | Status: DC
  Filled 2013-07-27: qty 60

## 2013-07-27 MED ORDER — FENTANYL CITRATE 0.05 MG/ML IJ SOLN: 25.0000 ug | INTRAMUSCULAR | Status: DC | PRN

## 2013-07-27 SURGICAL SUPPLY — 42 items
BANDAGE ELASTIC 3 VELCRO ST LF (GAUZE/BANDAGES/DRESSINGS) ×4 IMPLANT
BANDAGE ELASTIC 4 VELCRO ST LF (GAUZE/BANDAGES/DRESSINGS) ×4 IMPLANT
BANDAGE GAUZE ELAST BULKY 4 IN (GAUZE/BANDAGES/DRESSINGS) ×4 IMPLANT
BNDG ESMARK 4X9 LF (GAUZE/BANDAGES/DRESSINGS) ×4 IMPLANT
BNDG GAUZE ELAST 4 BULKY (GAUZE/BANDAGES/DRESSINGS) ×4 IMPLANT
COVER SURGICAL LIGHT HANDLE (MISCELLANEOUS) ×4 IMPLANT
CUFF TOURNIQUET SINGLE 18IN (TOURNIQUET CUFF) ×4 IMPLANT
CUFF TOURNIQUET SINGLE 24IN (TOURNIQUET CUFF) IMPLANT
DRAPE SURG 17X23 STRL (DRAPES) ×4 IMPLANT
DURAPREP 26ML APPLICATOR (WOUND CARE) ×4 IMPLANT
ELECT NEEDLE TIP 2.8 STRL (NEEDLE) ×4 IMPLANT
GAUZE XEROFORM 1X8 LF (GAUZE/BANDAGES/DRESSINGS) ×4 IMPLANT
GLOVE BIOGEL PI ORTHO PRO 7.5 (GLOVE) ×2
GLOVE BIOGEL PI ORTHO PRO SZ8 (GLOVE) ×2
GLOVE ORTHO TXT STRL SZ7.5 (GLOVE) ×4 IMPLANT
GLOVE PI ORTHO PRO STRL 7.5 (GLOVE) ×2 IMPLANT
GLOVE PI ORTHO PRO STRL SZ8 (GLOVE) ×2 IMPLANT
GLOVE SURG ORTHO 8.5 STRL (GLOVE) ×4 IMPLANT
GOWN STRL REUS W/ TWL LRG LVL3 (GOWN DISPOSABLE) ×4 IMPLANT
GOWN STRL REUS W/ TWL XL LVL3 (GOWN DISPOSABLE) ×4 IMPLANT
GOWN STRL REUS W/TWL LRG LVL3 (GOWN DISPOSABLE) ×4
GOWN STRL REUS W/TWL XL LVL3 (GOWN DISPOSABLE) ×4
KIT BASIN OR (CUSTOM PROCEDURE TRAY) ×4 IMPLANT
KIT ROOM TURNOVER OR (KITS) ×4 IMPLANT
NEEDLE HYPO 25GX1X1/2 BEV (NEEDLE) IMPLANT
NS IRRIG 1000ML POUR BTL (IV SOLUTION) ×4 IMPLANT
PACK ORTHO EXTREMITY (CUSTOM PROCEDURE TRAY) ×4 IMPLANT
PAD ARMBOARD 7.5X6 YLW CONV (MISCELLANEOUS) ×8 IMPLANT
PAD CAST 4YDX4 CTTN HI CHSV (CAST SUPPLIES) ×4 IMPLANT
PADDING CAST COTTON 4X4 STRL (CAST SUPPLIES) ×4
SPONGE GAUZE 4X4 12PLY (GAUZE/BANDAGES/DRESSINGS) ×4 IMPLANT
SPONGE GAUZE 4X4 12PLY STER LF (GAUZE/BANDAGES/DRESSINGS) ×4 IMPLANT
SPONGE SCRUB IODOPHOR (GAUZE/BANDAGES/DRESSINGS) ×4 IMPLANT
SUCTION FRAZIER TIP 10 FR DISP (SUCTIONS) IMPLANT
SUT ETHILON 3 0 PS 1 (SUTURE) ×4 IMPLANT
SYR CONTROL 10ML LL (SYRINGE) IMPLANT
TOWEL OR 17X24 6PK STRL BLUE (TOWEL DISPOSABLE) ×4 IMPLANT
TOWEL OR 17X26 10 PK STRL BLUE (TOWEL DISPOSABLE) ×4 IMPLANT
TUBE CONNECTING 12'X1/4 (SUCTIONS)
TUBE CONNECTING 12X1/4 (SUCTIONS) IMPLANT
UNDERPAD 30X30 INCONTINENT (UNDERPADS AND DIAPERS) ×4 IMPLANT
WATER STERILE IRR 1000ML POUR (IV SOLUTION) ×4 IMPLANT

## 2013-07-27 NOTE — Discharge Instructions (Signed)
Elevate the hand and wiggle the fingers,  Keep the dressing dry for one week then ok to remove the bandage and place Ban Aid over the sutures  Splint the left ring finger after the injection and ice to the left palm for several days  Follow up in two weeks in the office  [phone],

## 2013-07-27 NOTE — Anesthesia Postprocedure Evaluation (Signed)
Anesthesia Post Note  Patient: Steven [name]  Procedure(s) Performed: Procedure(s) (LRB): RIGHT RING FINGER A-1 RELEASE (Right) STEROID INJECTION (Left)  Anesthesia type: MAC  Patient location: PACU  Post pain: Pain level controlled  Post assessment: Patient's Cardiovascular Status Stable  Last Vitals:  Filed Vitals:   07/27/13 1517  BP:   Pulse:   Temp: 36.4 C  Resp:     Post vital signs: Reviewed and stable  Level of consciousness: sedated  Complications: No apparent anesthesia complications

## 2013-07-27 NOTE — Anesthesia Preprocedure Evaluation (Addendum)
Anesthesia Evaluation  Patient identified by MRN, date of birth, ID band Patient awake    Reviewed: Allergy & Precautions, H&P , NPO status , Patient's Chart, lab work & pertinent test results, reviewed documented beta blocker date and time   Airway Mallampati: II TM Distance: >3 FB Neck ROM: full    Dental  (+) Edentulous Upper   Pulmonary shortness of breath, former smoker,  breath sounds clear to auscultation        Cardiovascular hypertension, On Medications + Peripheral Vascular Disease negative cardio ROS  Rhythm:regular     Neuro/Psych  Headaches,  Neuromuscular disease negative psych ROS   GI/Hepatic Neg liver ROS, hiatal hernia, GERD-  Medicated and Controlled,  Endo/Other  negative endocrine ROS  Renal/GU Renal disease  negative genitourinary   Musculoskeletal   Abdominal   Peds  Hematology negative hematology ROS (+)   Anesthesia Other Findings See surgeon's H&P   Reproductive/Obstetrics negative OB ROS                         Anesthesia Physical Anesthesia Plan  ASA: III  Anesthesia Plan: General   Post-op Pain Management:    Induction: Intravenous  Airway Management Planned: LMA  Additional Equipment:   Intra-op Plan:   Post-operative Plan:   Informed Consent: I have reviewed the patients History and Physical, chart, labs and discussed the procedure including the risks, benefits and alternatives for the proposed anesthesia with the patient or authorized representative who has indicated his/her understanding and acceptance.   Dental Advisory Given  Plan Discussed with: CRNA and Surgeon  Anesthesia Plan Comments:         Anesthesia Quick Evaluation

## 2013-07-27 NOTE — Interval H&P Note (Signed)
History and Physical Interval Note:  07/27/2013 1:29 PM  Steven [name]  has presented today for surgery, with the diagnosis of RIGHT RING FINGER A-1 TRIIGGERING  The various methods of treatment have been discussed with the patient and family. After consideration of risks, benefits and other options for treatment, the patient has consented to  Procedure(s): RIGHT RING FINGER A-1 RELEASE (Right) as a surgical intervention .  The patient's history has been reviewed, patient examined, no change in status, stable for surgery.  I have reviewed the patient's chart and labs.  Questions were answered to the patient's satisfaction.     Coyt Govoni,STEVEN R

## 2013-07-27 NOTE — Brief Op Note (Signed)
07/27/2013  2:40 PM  PATIENT:  Steven [name]  63 y.o. male  PRE-OPERATIVE DIAGNOSIS:  RIGHT RING FINGER A-1 TRIIGGERING, left Ring finger triggering  POST-OPERATIVE DIAGNOSIS:  RIGHT RING FINGER A-1 TRIIGGERING, left ring finger triggering  PROCEDURE:  Procedure(s): RIGHT RING FINGER A-1 RELEASE (Right) STEROID INJECTION (Left) RF  SURGEON:  Surgeon(s) and Role:    * Augustin Schooling, MD - Primary  PHYSICIAN ASSISTANT:   ASSISTANTS: none   ANESTHESIA:   local and general  EBL:  min  BLOOD ADMINISTERED:none  DRAINS: none   LOCAL MEDICATIONS USED:  LIDOCAINE   SPECIMEN:  No Specimen  DISPOSITION OF SPECIMEN:  N/A  COUNTS:  YES  TOURNIQUET:  * No tourniquets in log *  DICTATION: .Other Dictation: Dictation Number [id number]  PLAN OF CARE: Discharge to home after PACU  PATIENT DISPOSITION:  PACU - hemodynamically stable.   Delay start of Pharmacological VTE agent (>24hrs) due to surgical blood loss or risk of bleeding: not applicable

## 2013-07-27 NOTE — Transfer of Care (Signed)
Immediate Anesthesia Transfer of Care Note  Patient: Steven [name]  Procedure(s) Performed: Procedure(s): RIGHT RING FINGER A-1 RELEASE (Right) STEROID INJECTION (Left)  Patient Location: PACU  Anesthesia Type:MAC  Level of Consciousness: awake, alert  and oriented  Airway & Oxygen Therapy: Patient Spontanous Breathing and Patient connected to nasal cannula oxygen  Post-op Assessment: Report given to PACU RN and Post -op Vital signs reviewed and stable  Post vital signs: Reviewed and stable  Complications: No apparent anesthesia complications

## 2013-07-28 NOTE — Op Note (Signed)
NAME:  Steven [name], Steven [name] NO.:  [id number]  MEDICAL RECORD NO.:  [id number]  LOCATION:  [facility]                         FACILITY:  [facility]  PHYSICIAN:  Doran Heater. Veverly Fells, M.D. DATE OF BIRTH:  1950/08/31  DATE OF PROCEDURE:  07/27/2013 DATE OF DISCHARGE:  07/27/2013                              OPERATIVE REPORT   PREOPERATIVE DIAGNOSIS: 1. Right ring finger triggering. 2. Left ring finger triggering.  POSTOPERATIVE DIAGNOSIS: 1. Right ring finger triggering. 2. Left ring finger triggering.  PROCEDURE PERFORMED:  Right ring finger A1 release and left ring finger injection with corticosteroid and local.  ATTENDING SURGEON:  Doran Heater. Veverly Fells, MD.  ASSISTANT:  None.  ANESTHESIA:  MAC plus local was used.  ESTIMATED BLOOD LOSS:  Minimal.  FLUID REPLACEMENT:  1000 mL crystalloid.  INSTRUMENT COUNTS:  Correct.  COMPLICATIONS:  There were no complications.  ANTIBIOTICS:  Perioperative antibiotics were given.  INDICATIONS:  Patient is a 63 year old male who presents with right ring finger trigger and this has been established for a quite some time and has failed conservative management including injections and bracing. Patient desired operative release of his A1 pulley.  Upon presentation to the OR today, in the pre-anesthesia area, he admits that his left ring began triggering and this is frustrating __________ wanted an injection.  While we are back in the operating room, we added this to the consent, and the patient __________ informed consent was obtained.  DESCRIPTION OF PROCEDURE:  After an adequate level of anesthesia achieved, the patient was positioned supine on the operating room table. He had monitored anesthesia care.  We prepped his left ring finger and we performed a time-out.  We then injected with 1 mL of lidocaine and 1 mL of Depo 40 into flexor sheaths with loss of resistance technique and applied a Band-Aid.  Next, we directed our attention  towards the right hand, we turned the table, did not use a tourniquet, sterilely prepped and draped the right hand and arm.  We then called our time-out on the right side.  Used a transverse skin incision over the A1 pulley in the palm.  A loop magnification was utilized to perform some local with lidocaine with no epinephrine, both at the incision and down in the deeper subcutaneous tissue, and down around the A1 pulley.  We next identified the A1 pulley which was easily identifiable using a 15 blade scalpel under direct vision and with protecting the flexor tendon underneath, we cut that A1 pulley, placed a micro hemostat up under the pulley distally and then expanded that and it popped.  We had nice release of A1 pulley, flexed the finger passively.  No impingement was noted.  We thoroughly irrigated and closed with nylon closure for skin. Sterile compressive bandage was applied.  The patient was transported to recovery room in stable condition having tolerated the surgery well.     Doran Heater. Veverly Fells, M.D.     SRN/MEDQ  D:  07/27/2013  T:  07/28/2013  Job:  [id number]

## 2013-07-31 ENCOUNTER — Encounter (HOSPITAL_COMMUNITY): Payer: Self-pay | Admitting: Orthopedic Surgery

## 2013-08-07 ENCOUNTER — Other Ambulatory Visit: Payer: Self-pay | Admitting: Family Medicine

## 2013-08-30 ENCOUNTER — Other Ambulatory Visit (INDEPENDENT_AMBULATORY_CARE_PROVIDER_SITE_OTHER): Payer: 59

## 2013-08-30 ENCOUNTER — Encounter: Payer: Self-pay | Admitting: *Deleted

## 2013-08-30 DIAGNOSIS — Z0000 Encounter for general adult medical examination without abnormal findings: Secondary | ICD-10-CM

## 2013-08-30 LAB — BASIC METABOLIC PANEL
BUN: 16 mg/dL (ref 6–23)
CALCIUM: 9.6 mg/dL (ref 8.4–10.5)
CO2: 29 mEq/L (ref 19–32)
Chloride: 103 mEq/L (ref 96–112)
Creatinine, Ser: 1.4 mg/dL (ref 0.4–1.5)
GFR: 56.4 mL/min — ABNORMAL LOW (ref >60.00)
Glucose, Bld: 79 mg/dL (ref 70–99)
Potassium: 4.6 mEq/L (ref 3.5–5.1)
Sodium: 140 mEq/L (ref 135–145)

## 2013-08-30 LAB — HEPATIC FUNCTION PANEL
ALK PHOS: 57 U/L (ref 39–117)
ALT: 34 U/L (ref 0–53)
AST: 29 U/L (ref 0–37)
Albumin: 4.5 g/dL (ref 3.5–5.2)
Bilirubin, Direct: 0.2 mg/dL (ref 0.0–0.3)
Total Bilirubin: 1 mg/dL (ref 0.2–1.2)
Total Protein: 7.3 g/dL (ref 6.0–8.3)

## 2013-08-30 LAB — LIPID PANEL
CHOL/HDL RATIO: 5
Cholesterol: 194 mg/dL (ref 0–200)
HDL: 41 mg/dL (ref >39.00)
LDL Cholesterol: 114 mg/dL — ABNORMAL HIGH (ref 0–99)
NONHDL: 153
Triglycerides: 193 mg/dL — ABNORMAL HIGH (ref 0.0–149.0)
VLDL: 38.6 mg/dL (ref 0.0–40.0)

## 2013-08-30 LAB — CBC WITH DIFFERENTIAL/PLATELET
Basophils Absolute: 0 10*3/uL (ref 0.0–0.1)
Basophils Relative: 0.7 % (ref 0.0–3.0)
Eosinophils Absolute: 0.1 10*3/uL (ref 0.0–0.7)
Eosinophils Relative: 2.5 % (ref 0.0–5.0)
HEMATOCRIT: 44.7 % (ref 39.0–52.0)
HEMOGLOBIN: 15.1 g/dL (ref 13.0–17.0)
Lymphocytes Relative: 29.4 % (ref 12.0–46.0)
Lymphs Abs: 1.6 10*3/uL (ref 0.7–4.0)
MCHC: 33.8 g/dL (ref 30.0–36.0)
MCV: 85.6 fl (ref 78.0–100.0)
MONO ABS: 0.5 10*3/uL (ref 0.1–1.0)
Monocytes Relative: 8.1 % (ref 3.0–12.0)
NEUTROS ABS: 3.3 10*3/uL (ref 1.4–7.7)
Neutrophils Relative %: 59.3 % (ref 43.0–77.0)
PLATELETS: 266 10*3/uL (ref 150.0–400.0)
RBC: 5.22 Mil/uL (ref 4.22–5.81)
RDW: 13.7 % (ref 11.5–15.5)
WBC: 5.6 10*3/uL (ref 4.0–10.5)

## 2013-08-30 LAB — POCT URINALYSIS DIPSTICK
BILIRUBIN UA: NEGATIVE
Blood, UA: NEGATIVE
Glucose, UA: NEGATIVE
Ketones, UA: NEGATIVE
LEUKOCYTES UA: NEGATIVE
Nitrite, UA: NEGATIVE
PH UA: 7
PROTEIN UA: NEGATIVE
SPEC GRAV UA: 1.02
Urobilinogen, UA: 0.2

## 2013-08-30 LAB — TSH: TSH: 3.57 u[IU]/mL (ref 0.35–4.50)

## 2013-08-30 LAB — PSA: PSA: 1.26 ng/mL (ref 0.10–4.00)

## 2013-09-06 ENCOUNTER — Ambulatory Visit (INDEPENDENT_AMBULATORY_CARE_PROVIDER_SITE_OTHER): Payer: 59 | Admitting: Family Medicine

## 2013-09-06 ENCOUNTER — Encounter: Payer: Self-pay | Admitting: Family Medicine

## 2013-09-06 VITALS — BP 125/79 | HR 62 | Temp 98.2°F | Ht 69.5 in | Wt 196.0 lb

## 2013-09-06 DIAGNOSIS — Z0000 Encounter for general adult medical examination without abnormal findings: Secondary | ICD-10-CM

## 2013-09-06 DIAGNOSIS — Z23 Encounter for immunization: Secondary | ICD-10-CM

## 2013-09-06 MED ORDER — ELETRIPTAN HYDROBROMIDE 20 MG PO TABS: 20.0000 mg | ORAL_TABLET | ORAL | Status: DC | PRN

## 2013-09-06 MED ORDER — TEMAZEPAM 30 MG PO CAPS: 30.0000 mg | ORAL_CAPSULE | Freq: Every evening | ORAL | Status: DC | PRN

## 2013-09-06 MED ORDER — ESOMEPRAZOLE MAGNESIUM 40 MG PO CPDR: 40.0000 mg | DELAYED_RELEASE_CAPSULE | Freq: Every day | ORAL | Status: DC

## 2013-09-06 MED ORDER — ACYCLOVIR 5 % EX CREA: 1.0000 "application " | TOPICAL_CREAM | Freq: Every day | CUTANEOUS | Status: DC | PRN

## 2013-09-06 MED ORDER — EZETIMIBE 10 MG PO TABS: 10.0000 mg | ORAL_TABLET | Freq: Every evening | ORAL | Status: DC

## 2013-09-06 MED ORDER — VALACYCLOVIR HCL 500 MG PO TABS: 500.0000 mg | ORAL_TABLET | Freq: Two times a day (BID) | ORAL | Status: DC | PRN

## 2013-09-06 MED ORDER — FENOFIBRATE 145 MG PO TABS: 145.0000 mg | ORAL_TABLET | Freq: Every evening | ORAL | Status: DC

## 2013-09-06 MED ORDER — AMLODIPINE BESYLATE 5 MG PO TABS: ORAL_TABLET | ORAL | Status: DC

## 2013-09-06 NOTE — Progress Notes (Signed)
   Subjective:    Patient ID: Steven [name], male    DOB: [birth year], 63 y.o.   MRN: [id number]  HPI 63 yr old male for a cpx. He feels well.    Review of Systems  Constitutional: Negative.   HENT: Negative.   Eyes: Negative.   Respiratory: Negative.   Cardiovascular: Negative.   Gastrointestinal: Negative.   Genitourinary: Negative.   Musculoskeletal: Negative.   Skin: Negative.   Neurological: Negative.   Psychiatric/Behavioral: Negative.        Objective:   Physical Exam  Constitutional: He is oriented to person, place, and time. He appears well-developed and well-nourished. No distress.  HENT:  Head: Normocephalic and atraumatic.  Right Ear: External ear normal.  Left Ear: External ear normal.  Nose: Nose normal.  Mouth/Throat: Oropharynx is clear and moist. No oropharyngeal exudate.  Eyes: Conjunctivae and EOM are normal. Pupils are equal, round, and reactive to light. Right eye exhibits no discharge. Left eye exhibits no discharge. No scleral icterus.  Neck: Neck supple. No JVD present. No tracheal deviation present. No thyromegaly present.  Cardiovascular: Normal rate, regular rhythm, normal heart sounds and intact distal pulses.  Exam reveals no gallop and no friction rub.   No murmur heard. Pulmonary/Chest: Effort normal and breath sounds normal. No respiratory distress. He has no wheezes. He has no rales. He exhibits no tenderness.  Abdominal: Soft. Bowel sounds are normal. He exhibits no distension and no mass. There is no tenderness. There is no rebound and no guarding.  Genitourinary: Rectum normal, prostate normal and penis normal. Guaiac negative stool. No penile tenderness.  Musculoskeletal: Normal range of motion. He exhibits no edema and no tenderness.  Lymphadenopathy:    He has no cervical adenopathy.  Neurological: He is alert and oriented to person, place, and time. He has normal reflexes. No cranial nerve deficit. He exhibits normal muscle tone.  Coordination normal.  Skin: Skin is warm and dry. No rash noted. He is not diaphoretic. No erythema. No pallor.  Psychiatric: He has a normal mood and affect. His behavior is normal. Judgment and thought content normal.          Assessment & Plan:  Well exam.

## 2013-09-06 NOTE — Addendum Note (Signed)
Addended by: Aggie Hacker A on: 09/06/2013 11:31 AM   Modules accepted: Orders

## 2013-09-06 NOTE — Progress Notes (Signed)
Pre visit review using our clinic review tool, if applicable. No additional management support is needed unless otherwise documented below in the visit note. 

## 2013-09-11 ENCOUNTER — Encounter: Payer: Self-pay | Admitting: Gastroenterology

## 2013-09-18 ENCOUNTER — Encounter: Payer: Self-pay | Admitting: Family Medicine

## 2013-10-04 ENCOUNTER — Telehealth: Payer: Self-pay | Admitting: Family Medicine

## 2013-10-04 NOTE — Telephone Encounter (Signed)
Opened in error

## 2013-10-10 ENCOUNTER — Other Ambulatory Visit: Payer: Self-pay | Admitting: Family Medicine

## 2014-01-16 ENCOUNTER — Telehealth: Payer: Self-pay | Admitting: Family Medicine

## 2014-01-16 NOTE — Telephone Encounter (Signed)
amLODipine (NORVASC) 5 MG tablet, Aciphex 20mg     fenofibrate (TRICOR) 145 MG tablet , ezetimibe (ZETIA) 10 MG tablet  Pt request  90 day supply on all above meds   Pharmacy ; [facility] NUMBER  [phone]

## 2014-01-18 MED ORDER — EZETIMIBE 10 MG PO TABS: 10.0000 mg | ORAL_TABLET | Freq: Every evening | ORAL | Status: DC

## 2014-01-18 MED ORDER — FENOFIBRATE 145 MG PO TABS: 145.0000 mg | ORAL_TABLET | Freq: Every evening | ORAL | Status: DC

## 2014-01-18 MED ORDER — AMLODIPINE BESYLATE 5 MG PO TABS: ORAL_TABLET | ORAL | Status: DC

## 2014-01-18 NOTE — Telephone Encounter (Signed)
I faxed all 3 scripts to below number.

## 2014-01-30 ENCOUNTER — Telehealth: Payer: Self-pay | Admitting: Family Medicine

## 2014-01-30 MED ORDER — RABEPRAZOLE SODIUM 20 MG PO TBEC: 20.0000 mg | DELAYED_RELEASE_TABLET | Freq: Two times a day (BID) | ORAL | Status: DC

## 2014-01-30 NOTE — Telephone Encounter (Signed)
Patient's wife states [facility] did not receive  Fenofibrate faxed on 01/18/14 and the Aciphex 20 mg was left off.  Can you please send the 2 medications to the pharmacy? The information is in the below initial request.

## 2014-01-30 NOTE — Telephone Encounter (Signed)
I called in 3 scripts to [facility] at [phone] and faxed the script for Aciphex to [phone]. I left a voice message for pt with this information and advised pt to check with the pharmacy today and make sure that they received all 4 script requests.

## 2014-01-30 NOTE — Telephone Encounter (Signed)
Pt request refill of the following: RABEprazole (ACIPHEX) 20 MG tablet  Pt said she contacted the pharmacy and they said they did not get the above rx    Phamacy:  [facility]

## 2014-01-30 NOTE — Addendum Note (Signed)
Addended by: Aggie Hacker A on: 01/30/2014 10:27 AM   Modules accepted: Orders

## 2014-01-30 NOTE — Telephone Encounter (Signed)
I called in script and spoke with pt's wife.

## 2014-02-08 ENCOUNTER — Telehealth: Payer: Self-pay | Admitting: Family Medicine

## 2014-02-08 ENCOUNTER — Other Ambulatory Visit: Payer: Self-pay | Admitting: Family Medicine

## 2014-02-08 MED ORDER — FIRST-DUKES MOUTHWASH MT SUSP: 5.0000 mL | Freq: Four times a day (QID) | OROMUCOSAL | Status: DC | PRN

## 2014-02-08 NOTE — Telephone Encounter (Signed)
Per Dr. Sarajane Jews okay to refill, script was sent e-scribe and I spoke with pt's wife.

## 2014-02-08 NOTE — Telephone Encounter (Signed)
Pt is going on vacation this wekend and gets sores in his mouth.  Would like to take this rx w/ him. Would like a refill. Diphenhyd-Hydrocort-Nystatin (FIRST-DUKES MOUTHWASH) SUSP [facility] [address]

## 2014-02-08 NOTE — Telephone Encounter (Signed)
Per Dr. Sarajane Jews okay to refill and I did send e-scribe.

## 2014-05-13 ENCOUNTER — Telehealth: Payer: Self-pay

## 2014-05-13 NOTE — Telephone Encounter (Signed)
[name] refill request for fenofibrate (TRICOR) 145 MG tablet and RABEprazole (ACIPHEX) 20 MG tablet

## 2014-05-13 NOTE — Telephone Encounter (Signed)
Patient's wife called about sending re-fills to [facility].  She is requesting the medications have re-fills to last the rest of the year.  Patient need re-fill on the following: amLODipine (NORVASC) 5 MG tablet, ezetimibe (ZETIA) 10 MG tablet, RABEprazole (ACIPHEX) 20 MG tablet, and fenofibrate (TRICOR) 145 MG tablet.

## 2014-05-14 MED ORDER — RABEPRAZOLE SODIUM 20 MG PO TBEC: 20.0000 mg | DELAYED_RELEASE_TABLET | Freq: Two times a day (BID) | ORAL | Status: DC

## 2014-05-14 MED ORDER — FENOFIBRATE 145 MG PO TABS: 145.0000 mg | ORAL_TABLET | Freq: Every evening | ORAL | Status: DC

## 2014-05-14 MED ORDER — EZETIMIBE 10 MG PO TABS: 10.0000 mg | ORAL_TABLET | Freq: Every evening | ORAL | Status: DC

## 2014-05-14 MED ORDER — AMLODIPINE BESYLATE 5 MG PO TABS: ORAL_TABLET | ORAL | Status: DC

## 2014-05-14 NOTE — Telephone Encounter (Signed)
I sent all 4 scripts e-scribe.

## 2014-06-24 ENCOUNTER — Encounter: Payer: Self-pay | Admitting: Family Medicine

## 2014-06-24 ENCOUNTER — Ambulatory Visit (INDEPENDENT_AMBULATORY_CARE_PROVIDER_SITE_OTHER): Payer: PPO | Admitting: Family Medicine

## 2014-06-24 VITALS — BP 130/78 | HR 66 | Temp 98.4°F | Ht 69.5 in | Wt 199.0 lb

## 2014-06-24 DIAGNOSIS — E785 Hyperlipidemia, unspecified: Secondary | ICD-10-CM

## 2014-06-24 DIAGNOSIS — I10 Essential (primary) hypertension: Secondary | ICD-10-CM | POA: Diagnosis not present

## 2014-06-24 DIAGNOSIS — K219 Gastro-esophageal reflux disease without esophagitis: Secondary | ICD-10-CM

## 2014-06-24 DIAGNOSIS — G43009 Migraine without aura, not intractable, without status migrainosus: Secondary | ICD-10-CM

## 2014-06-24 LAB — CBC WITH DIFFERENTIAL/PLATELET
Basophils Absolute: 0 10*3/uL (ref 0.0–0.1)
Basophils Relative: 0.8 % (ref 0.0–3.0)
Eosinophils Absolute: 0.2 10*3/uL (ref 0.0–0.7)
Eosinophils Relative: 3.4 % (ref 0.0–5.0)
HEMATOCRIT: 45 % (ref 39.0–52.0)
Hemoglobin: 15.2 g/dL (ref 13.0–17.0)
LYMPHS PCT: 30.4 % (ref 12.0–46.0)
Lymphs Abs: 1.7 10*3/uL (ref 0.7–4.0)
MCHC: 33.8 g/dL (ref 30.0–36.0)
MCV: 86.7 fl (ref 78.0–100.0)
MONOS PCT: 9.8 % (ref 3.0–12.0)
Monocytes Absolute: 0.5 10*3/uL (ref 0.1–1.0)
NEUTROS ABS: 3 10*3/uL (ref 1.4–7.7)
Neutrophils Relative %: 55.6 % (ref 43.0–77.0)
PLATELETS: 261 10*3/uL (ref 150.0–400.0)
RBC: 5.19 Mil/uL (ref 4.22–5.81)
RDW: 13.7 % (ref 11.5–15.5)
WBC: 5.4 10*3/uL (ref 4.0–10.5)

## 2014-06-24 LAB — BASIC METABOLIC PANEL
BUN: 18 mg/dL (ref 6–23)
CO2: 28 meq/L (ref 19–32)
Calcium: 9.8 mg/dL (ref 8.4–10.5)
Chloride: 105 mEq/L (ref 96–112)
Creatinine, Ser: 1.33 mg/dL (ref 0.40–1.50)
GFR: 57.24 mL/min — ABNORMAL LOW (ref >60.00)
Glucose, Bld: 95 mg/dL (ref 70–99)
Potassium: 4.5 mEq/L (ref 3.5–5.1)
Sodium: 139 mEq/L (ref 135–145)

## 2014-06-24 LAB — LIPID PANEL
Cholesterol: 197 mg/dL (ref 0–200)
HDL: 38.3 mg/dL — AB (ref >39.00)
LDL CALC: 126 mg/dL — AB (ref 0–99)
NonHDL: 158.7
TRIGLYCERIDES: 163 mg/dL — AB (ref 0.0–149.0)
Total CHOL/HDL Ratio: 5
VLDL: 32.6 mg/dL (ref 0.0–40.0)

## 2014-06-24 LAB — HEPATIC FUNCTION PANEL
ALBUMIN: 4.5 g/dL (ref 3.5–5.2)
ALT: 28 U/L (ref 0–53)
AST: 23 U/L (ref 0–37)
Alkaline Phosphatase: 55 U/L (ref 39–117)
Bilirubin, Direct: 0.1 mg/dL (ref 0.0–0.3)
TOTAL PROTEIN: 7.2 g/dL (ref 6.0–8.3)
Total Bilirubin: 0.5 mg/dL (ref 0.2–1.2)

## 2014-06-24 LAB — TSH: TSH: 2.11 u[IU]/mL (ref 0.35–4.50)

## 2014-06-24 MED ORDER — FENOFIBRATE 145 MG PO TABS: 145.0000 mg | ORAL_TABLET | Freq: Every evening | ORAL | Status: DC

## 2014-06-24 MED ORDER — AMLODIPINE BESYLATE 5 MG PO TABS: ORAL_TABLET | ORAL | Status: DC

## 2014-06-24 MED ORDER — TEMAZEPAM 30 MG PO CAPS: 30.0000 mg | ORAL_CAPSULE | Freq: Every evening | ORAL | Status: DC | PRN

## 2014-06-24 MED ORDER — EZETIMIBE 10 MG PO TABS: 10.0000 mg | ORAL_TABLET | Freq: Every evening | ORAL | Status: DC

## 2014-06-24 MED ORDER — ESOMEPRAZOLE MAGNESIUM 40 MG PO CPDR: 40.0000 mg | DELAYED_RELEASE_CAPSULE | Freq: Every day | ORAL | Status: DC

## 2014-06-24 MED ORDER — OMEGA-3 FISH OIL 1000 MG PO CAPS: 1.0000 | ORAL_CAPSULE | Freq: Every day | ORAL | Status: DC

## 2014-06-24 MED ORDER — ELETRIPTAN HYDROBROMIDE 20 MG PO TABS: 20.0000 mg | ORAL_TABLET | ORAL | Status: DC | PRN

## 2014-06-24 MED ORDER — RABEPRAZOLE SODIUM 20 MG PO TBEC: 20.0000 mg | DELAYED_RELEASE_TABLET | Freq: Two times a day (BID) | ORAL | Status: DC

## 2014-06-24 NOTE — Progress Notes (Signed)
Pre visit review using our clinic review tool, if applicable. No additional management support is needed unless otherwise documented below in the visit note. 

## 2014-06-24 NOTE — Progress Notes (Signed)
   Subjective:    Patient ID: Steven [name], male    DOB: 1950/11/17, 64 y.o.   MRN: [id number]  HPI Here to follow up. He feels well. He started taking omega 3 fatty acid pills 4 months ago to help his arthritis and this has worked well. He hopes it hads helped his lipids levels as well . His BP is stable. His HAs are stab;le.  Review of Systems  Constitutional: Negative.   Respiratory: Negative.   Cardiovascular: Negative.   Endocrine: Negative.   Hematological: Negative.        Objective:   Physical Exam  Constitutional: He is oriented to person, place, and time. He appears well-developed and well-nourished.  Neck: No thyromegaly present.  Cardiovascular: Normal rate, regular rhythm, normal heart sounds and intact distal pulses.   Pulmonary/Chest: Effort normal and breath sounds normal.  Musculoskeletal: Normal range of motion. He exhibits no edema or tenderness.  Lymphadenopathy:    He has no cervical adenopathy.  Neurological: He is alert and oriented to person, place, and time.          Assessment & Plan:  His arthritis pain has improved. His HTN is well controlled. We will get fasting labs to check lipids, etc.

## 2014-07-19 ENCOUNTER — Encounter: Payer: Self-pay | Admitting: Family Medicine

## 2014-08-29 ENCOUNTER — Telehealth: Payer: Self-pay | Admitting: Family Medicine

## 2014-08-29 NOTE — Telephone Encounter (Signed)
Pt is having surgery on left hand trigger finger on wed, 8/24. [facility], dr Veverly Fells has been waiting to hear from dr fry For pt's surgery clearance. pls advise  Aware dr is out this afternoon. But need to hear asap.

## 2014-08-30 NOTE — Telephone Encounter (Signed)
The form was signed and we will fax it over now

## 2014-08-30 NOTE — Telephone Encounter (Signed)
Form faxed and confirmed

## 2014-09-18 ENCOUNTER — Encounter: Payer: Self-pay | Admitting: Family Medicine

## 2014-11-04 ENCOUNTER — Encounter: Payer: Self-pay | Admitting: Family Medicine

## 2014-11-04 ENCOUNTER — Ambulatory Visit (INDEPENDENT_AMBULATORY_CARE_PROVIDER_SITE_OTHER): Payer: PPO | Admitting: Family Medicine

## 2014-11-04 VITALS — BP 128/77 | HR 61 | Temp 98.4°F | Ht 69.5 in | Wt 191.0 lb

## 2014-11-04 DIAGNOSIS — Z23 Encounter for immunization: Secondary | ICD-10-CM | POA: Diagnosis not present

## 2014-11-04 DIAGNOSIS — Z0000 Encounter for general adult medical examination without abnormal findings: Secondary | ICD-10-CM | POA: Diagnosis not present

## 2014-11-04 DIAGNOSIS — E785 Hyperlipidemia, unspecified: Secondary | ICD-10-CM | POA: Diagnosis not present

## 2014-11-04 LAB — POCT URINALYSIS DIPSTICK
BILIRUBIN UA: NEGATIVE
GLUCOSE UA: NEGATIVE
Ketones, UA: NEGATIVE
NITRITE UA: NEGATIVE
Protein, UA: NEGATIVE
RBC UA: NEGATIVE
Spec Grav, UA: 1.025
Urobilinogen, UA: 1
pH, UA: 6.5

## 2014-11-04 LAB — LIPID PANEL
Cholesterol: 170 mg/dL (ref 0–200)
HDL: 37 mg/dL — ABNORMAL LOW (ref >39.00)
LDL CALC: 106 mg/dL — AB (ref 0–99)
NonHDL: 133.44
Total CHOL/HDL Ratio: 5
Triglycerides: 138 mg/dL (ref 0.0–149.0)
VLDL: 27.6 mg/dL (ref 0.0–40.0)

## 2014-11-04 LAB — CBC WITH DIFFERENTIAL/PLATELET
BASOS ABS: 0 10*3/uL (ref 0.0–0.1)
Basophils Relative: 0.8 % (ref 0.0–3.0)
EOS ABS: 0.1 10*3/uL (ref 0.0–0.7)
Eosinophils Relative: 2.2 % (ref 0.0–5.0)
HEMATOCRIT: 46.5 % (ref 39.0–52.0)
HEMOGLOBIN: 15.5 g/dL (ref 13.0–17.0)
LYMPHS PCT: 32.6 % (ref 12.0–46.0)
Lymphs Abs: 1.6 10*3/uL (ref 0.7–4.0)
MCHC: 33.3 g/dL (ref 30.0–36.0)
MCV: 87.4 fl (ref 78.0–100.0)
MONOS PCT: 9.8 % (ref 3.0–12.0)
Monocytes Absolute: 0.5 10*3/uL (ref 0.1–1.0)
Neutro Abs: 2.7 10*3/uL (ref 1.4–7.7)
Neutrophils Relative %: 54.6 % (ref 43.0–77.0)
Platelets: 271 10*3/uL (ref 150.0–400.0)
RBC: 5.33 Mil/uL (ref 4.22–5.81)
RDW: 14 % (ref 11.5–15.5)
WBC: 5 10*3/uL (ref 4.0–10.5)

## 2014-11-04 LAB — BASIC METABOLIC PANEL
BUN: 22 mg/dL (ref 6–23)
CO2: 29 mEq/L (ref 19–32)
Calcium: 9.8 mg/dL (ref 8.4–10.5)
Chloride: 104 mEq/L (ref 96–112)
Creatinine, Ser: 1.29 mg/dL (ref 0.40–1.50)
GFR: 59.23 mL/min — ABNORMAL LOW (ref >60.00)
Glucose, Bld: 92 mg/dL (ref 70–99)
POTASSIUM: 4.9 meq/L (ref 3.5–5.1)
SODIUM: 141 meq/L (ref 135–145)

## 2014-11-04 LAB — HEPATIC FUNCTION PANEL
ALK PHOS: 63 U/L (ref 39–117)
ALT: 28 U/L (ref 0–53)
AST: 25 U/L (ref 0–37)
Albumin: 4.5 g/dL (ref 3.5–5.2)
BILIRUBIN DIRECT: 0.2 mg/dL (ref 0.0–0.3)
TOTAL PROTEIN: 7.2 g/dL (ref 6.0–8.3)
Total Bilirubin: 0.8 mg/dL (ref 0.2–1.2)

## 2014-11-04 LAB — TSH: TSH: 1.07 u[IU]/mL (ref 0.35–4.50)

## 2014-11-04 LAB — PSA: PSA: 0.91 ng/mL (ref 0.10–4.00)

## 2014-11-04 MED ORDER — LOSARTAN POTASSIUM 50 MG PO TABS: 50.0000 mg | ORAL_TABLET | Freq: Every day | ORAL | Status: DC

## 2014-11-04 NOTE — Progress Notes (Signed)
   Subjective:    Patient ID: Steven [name], male    DOB: Apr 21, 1950, 64 y.o.   MRN: [id number]  HPI 64 yr old male for a cpx. He feels well most of the time but he does describe an occasional episode of extreme fatigue. 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 He suddenly felt very tired and had to stop and go home. After he sat and rested for awhile he felt okay. These episodes never involve chest pains or SOB. His BP as been stable but his pulse rate stays low most of the time.    Review of Systems  Constitutional: Positive for fatigue. Negative for fever, chills, diaphoresis, activity change, appetite change and unexpected weight change.  HENT: Negative.   Eyes: Negative.   Respiratory: Negative.   Cardiovascular: Negative.   Gastrointestinal: Negative.   Genitourinary: Negative.   Musculoskeletal: Negative.   Skin: Negative.   Neurological: Negative.   Psychiatric/Behavioral: Negative.        Objective:   Physical Exam  Constitutional: He is oriented to person, place, and time. He appears well-developed and well-nourished. No distress.  HENT:  Head: Normocephalic and atraumatic.  Right Ear: External ear normal.  Left Ear: External ear normal.  Nose: Nose normal.  Mouth/Throat: Oropharynx is clear and moist. No oropharyngeal exudate.  Eyes: Conjunctivae and EOM are normal. Pupils are equal, round, and reactive to light. Right eye exhibits no discharge. Left eye exhibits no discharge. No scleral icterus.  Neck: Neck supple. No JVD present. No tracheal deviation present. No thyromegaly present.  Cardiovascular: Normal rate, regular rhythm, normal heart sounds and intact distal pulses.  Exam reveals no gallop and no friction rub.   No murmur heard. EKG shows sinus bradycardia   Pulmonary/Chest: Effort normal and breath sounds normal. No respiratory distress. He has no wheezes. He has no rales. He exhibits no tenderness.  Abdominal: Soft. Bowel  sounds are normal. He exhibits no distension and no mass. There is no tenderness. There is no rebound and no guarding.  Genitourinary: Rectum normal, prostate normal and penis normal. Guaiac negative stool. No penile tenderness.  Musculoskeletal: Normal range of motion. He exhibits no edema or tenderness.  Lymphadenopathy:    He has no cervical adenopathy.  Neurological: He is alert and oriented to person, place, and time. He has normal reflexes. No cranial nerve deficit. He exhibits normal muscle tone. Coordination normal.  Skin: Skin is warm and dry. No rash noted. He is not diaphoretic. No erythema. No pallor.  Psychiatric: He has a normal mood and affect. His behavior is normal. Judgment and thought content normal.          Assessment & Plan:  Well exam. Get fasting labs. We discussed diet and exercise. I think his fatigue spells may be related to bradycardia, and the amlodipine may be exacerbating this. We will stop this and switch to Losartan 50 mg daily. Recheck one month.

## 2014-11-04 NOTE — Progress Notes (Signed)
Pre visit review using our clinic review tool, if applicable. No additional management support is needed unless otherwise documented below in the visit note. 

## 2014-12-27 ENCOUNTER — Encounter: Payer: Self-pay | Admitting: Family Medicine

## 2014-12-27 ENCOUNTER — Ambulatory Visit (INDEPENDENT_AMBULATORY_CARE_PROVIDER_SITE_OTHER): Payer: PPO | Admitting: Family Medicine

## 2014-12-27 VITALS — BP 148/82 | HR 75 | Temp 98.9°F | Ht 69.5 in | Wt 195.0 lb

## 2014-12-27 DIAGNOSIS — J019 Acute sinusitis, unspecified: Secondary | ICD-10-CM

## 2014-12-27 MED ORDER — METHYLPREDNISOLONE ACETATE 80 MG/ML IJ SUSP
120.0000 mg | Freq: Once | INTRAMUSCULAR | Status: AC
  Administered 2014-12-27: 120 mg via INTRAMUSCULAR

## 2014-12-27 MED ORDER — LEVOFLOXACIN 500 MG PO TABS: 500.0000 mg | ORAL_TABLET | Freq: Every day | ORAL | Status: AC

## 2014-12-27 NOTE — Progress Notes (Signed)
   Subjective:    Patient ID: Steven [name], male    DOB: [birth year], 64 y.o.   MRN: [id number]  HPI Here for one week of sinus pressure, PND, and a dry cough. No fever. On Mucinex.    Review of Systems  Constitutional: Negative.   HENT: Positive for congestion, postnasal drip, sinus pressure and sore throat. Negative for ear pain.   Eyes: Negative.   Respiratory: Positive for cough.        Objective:   Physical Exam  Constitutional: He appears well-developed and well-nourished.  HENT:  Right Ear: External ear normal.  Left Ear: External ear normal.  Nose: Nose normal.  Mouth/Throat: Oropharynx is clear and moist.  Eyes: Conjunctivae are normal.  Pulmonary/Chest: Effort normal and breath sounds normal.  Lymphadenopathy:    He has no cervical adenopathy.          Assessment & Plan:  Sinusitis, treat with Levaquin.

## 2014-12-27 NOTE — Addendum Note (Signed)
Addended by: Aggie Hacker A on: 12/27/2014 02:07 PM   Modules accepted: Orders

## 2014-12-27 NOTE — Progress Notes (Signed)
Pre visit review using our clinic review tool, if applicable. No additional management support is needed unless otherwise documented below in the visit note. 

## 2015-01-24 ENCOUNTER — Telehealth: Payer: Self-pay | Admitting: Family Medicine

## 2015-01-24 MED ORDER — LOSARTAN POTASSIUM 50 MG PO TABS: 50.0000 mg | ORAL_TABLET | Freq: Every day | ORAL | Status: DC

## 2015-01-24 NOTE — Telephone Encounter (Signed)
done

## 2015-02-02 ENCOUNTER — Other Ambulatory Visit: Payer: Self-pay | Admitting: Family Medicine

## 2015-02-13 DIAGNOSIS — N400 Enlarged prostate without lower urinary tract symptoms: Secondary | ICD-10-CM | POA: Diagnosis not present

## 2015-02-13 DIAGNOSIS — N200 Calculus of kidney: Secondary | ICD-10-CM | POA: Diagnosis not present

## 2015-02-17 DIAGNOSIS — L739 Follicular disorder, unspecified: Secondary | ICD-10-CM | POA: Diagnosis not present

## 2015-02-17 DIAGNOSIS — B360 Pityriasis versicolor: Secondary | ICD-10-CM | POA: Diagnosis not present

## 2015-02-17 DIAGNOSIS — L859 Epidermal thickening, unspecified: Secondary | ICD-10-CM | POA: Diagnosis not present

## 2015-02-17 DIAGNOSIS — L814 Other melanin hyperpigmentation: Secondary | ICD-10-CM | POA: Diagnosis not present

## 2015-02-17 DIAGNOSIS — D1800 Hemangioma unspecified site: Secondary | ICD-10-CM | POA: Diagnosis not present

## 2015-02-17 DIAGNOSIS — I788 Other diseases of capillaries: Secondary | ICD-10-CM | POA: Diagnosis not present

## 2015-02-17 DIAGNOSIS — L821 Other seborrheic keratosis: Secondary | ICD-10-CM | POA: Diagnosis not present

## 2015-02-17 DIAGNOSIS — D229 Melanocytic nevi, unspecified: Secondary | ICD-10-CM | POA: Diagnosis not present

## 2015-02-17 DIAGNOSIS — Z1283 Encounter for screening for malignant neoplasm of skin: Secondary | ICD-10-CM | POA: Diagnosis not present

## 2015-03-17 ENCOUNTER — Encounter: Payer: Self-pay | Admitting: Family Medicine

## 2015-03-17 ENCOUNTER — Ambulatory Visit (INDEPENDENT_AMBULATORY_CARE_PROVIDER_SITE_OTHER): Payer: PPO | Admitting: Family Medicine

## 2015-03-17 VITALS — BP 137/77 | HR 68 | Temp 98.8°F | Ht 69.5 in | Wt 200.0 lb

## 2015-03-17 DIAGNOSIS — J019 Acute sinusitis, unspecified: Secondary | ICD-10-CM | POA: Diagnosis not present

## 2015-03-17 MED ORDER — TEMAZEPAM 30 MG PO CAPS: 30.0000 mg | ORAL_CAPSULE | Freq: Every evening | ORAL | Status: DC | PRN

## 2015-03-17 MED ORDER — LEVOFLOXACIN 500 MG PO TABS: 500.0000 mg | ORAL_TABLET | Freq: Every day | ORAL | Status: AC

## 2015-03-17 MED ORDER — METHYLPREDNISOLONE ACETATE 80 MG/ML IJ SUSP
120.0000 mg | Freq: Once | INTRAMUSCULAR | Status: AC
  Administered 2015-03-17: 120 mg via INTRAMUSCULAR

## 2015-03-17 NOTE — Progress Notes (Signed)
   Subjective:    Patient ID: Steven [name], male    DOB: 1950-09-06, 65 y.o.   MRN: [id number]  HPI Here for 4 days of sinus pressure, HA, and blowing green mucus from the nose. On Mucinex.   Review of Systems  Constitutional: Negative.   HENT: Positive for congestion, ear pain, postnasal drip, sinus pressure and sore throat.   Eyes: Negative.   Respiratory: Negative.        Objective:   Physical Exam  Constitutional: He appears well-developed and well-nourished.  HENT:  Right Ear: External ear normal.  Left Ear: External ear normal.  Nose: Nose normal.  Mouth/Throat: Oropharynx is clear and moist.  Eyes: Conjunctivae are normal.  Neck: Neck supple. No thyromegaly present.  Pulmonary/Chest: Effort normal and breath sounds normal.  Lymphadenopathy:    He has no cervical adenopathy.          Assessment & Plan:  Sinusitis, treat with 14 days of Levaquin

## 2015-03-17 NOTE — Progress Notes (Signed)
Pre visit review using our clinic review tool, if applicable. No additional management support is needed unless otherwise documented below in the visit note. 

## 2015-03-17 NOTE — Addendum Note (Signed)
Addended by: Aggie Hacker A on: 03/17/2015 12:31 PM   Modules accepted: Orders

## 2015-04-03 ENCOUNTER — Encounter: Payer: Self-pay | Admitting: Gastroenterology

## 2015-05-07 ENCOUNTER — Encounter: Payer: Self-pay | Admitting: Gastroenterology

## 2015-05-09 DIAGNOSIS — H1132 Conjunctival hemorrhage, left eye: Secondary | ICD-10-CM | POA: Diagnosis not present

## 2015-05-30 ENCOUNTER — Ambulatory Visit (INDEPENDENT_AMBULATORY_CARE_PROVIDER_SITE_OTHER): Payer: PPO | Admitting: Family Medicine

## 2015-05-30 ENCOUNTER — Encounter: Payer: Self-pay | Admitting: Family Medicine

## 2015-05-30 VITALS — BP 128/76 | HR 63 | Temp 98.3°F | Ht 69.5 in | Wt 194.0 lb

## 2015-05-30 DIAGNOSIS — K591 Functional diarrhea: Secondary | ICD-10-CM

## 2015-05-30 DIAGNOSIS — E785 Hyperlipidemia, unspecified: Secondary | ICD-10-CM

## 2015-05-30 LAB — HEPATIC FUNCTION PANEL
ALK PHOS: 45 U/L (ref 39–117)
ALT: 25 U/L (ref 0–53)
AST: 24 U/L (ref 0–37)
Albumin: 4.7 g/dL (ref 3.5–5.2)
BILIRUBIN DIRECT: 0.1 mg/dL (ref 0.0–0.3)
BILIRUBIN TOTAL: 0.8 mg/dL (ref 0.2–1.2)
Total Protein: 7 g/dL (ref 6.0–8.3)

## 2015-05-30 LAB — LIPID PANEL
Cholesterol: 231 mg/dL — ABNORMAL HIGH (ref 0–200)
HDL: 34 mg/dL — AB (ref >39.00)
LDL CALC: 161 mg/dL — AB (ref 0–99)
NONHDL: 197.22
Total CHOL/HDL Ratio: 7
Triglycerides: 180 mg/dL — ABNORMAL HIGH (ref 0.0–149.0)
VLDL: 36 mg/dL (ref 0.0–40.0)

## 2015-05-30 MED ORDER — COLESTIPOL HCL 5 G PO GRAN: 5.0000 g | GRANULES | Freq: Two times a day (BID) | ORAL | Status: DC

## 2015-05-30 NOTE — Progress Notes (Signed)
Pre visit review using our clinic review tool, if applicable. No additional management support is needed unless otherwise documented below in the visit note. 

## 2015-05-30 NOTE — Progress Notes (Signed)
   Subjective:    Patient ID: Steven [name], male    DOB: 07-Jun-1950, 64 y.o.   MRN: [id number]  HPI Here for several months of frequent diarrhea, bowel urgency, and mild cramping. This usually hits him after the first meal of the day, either breakfast or lunch. It may occur in the evenings on occasion. No nausea or fever. He has stopped taking Zetia and fish oil capsules as a test, but this made no difference. He also describes bloating and passing a lot of flatus. Appetite is good, weight is stable.    Review of Systems  Constitutional: Negative.   Respiratory: Negative.   Cardiovascular: Negative.   Gastrointestinal: Positive for diarrhea and abdominal distention. Negative for nausea, vomiting, abdominal pain, constipation, blood in stool, anal bleeding and rectal pain.       Objective:   Physical Exam  Constitutional: He appears well-developed and well-nourished.  Cardiovascular: Normal rate, regular rhythm, normal heart sounds and intact distal pulses.   Pulmonary/Chest: Effort normal and breath sounds normal.  Abdominal: Soft. Bowel sounds are normal. He exhibits no distension and no mass. There is no tenderness. There is no rebound and no guarding.          Assessment & Plan:  This sounds like IBS. I suggested he try an OTC probiotic daily and reduce his intake of caffeine. He is also interested in trying Colestipol, since a friend of his uses this with success for a similar problem. We will start him on 5 mg bid. Also he will see Dr. Ardis Hughs a a few weeks to prepare for another colonoscopy, and I asked him to run this by him for his opinion.  Laurey Morale, MD

## 2015-06-05 ENCOUNTER — Telehealth: Payer: Self-pay

## 2015-06-05 ENCOUNTER — Ambulatory Visit (AMBULATORY_SURGERY_CENTER): Payer: Self-pay

## 2015-06-05 VITALS — Ht 70.0 in | Wt 195.2 lb

## 2015-06-05 DIAGNOSIS — Z860100 Personal history of colon polyps, unspecified: Secondary | ICD-10-CM

## 2015-06-05 MED ORDER — SUPREP BOWEL PREP KIT 17.5-3.13-1.6 GM/177ML PO SOLN: 1.0000 | Freq: Once | ORAL | Status: DC

## 2015-06-05 NOTE — Telephone Encounter (Signed)
Pt wanted to be reminded to discuss possible IBD with diarrhea predominant on day of procedure.  Thank you, Angela/PV

## 2015-06-05 NOTE — Progress Notes (Signed)
No allergies to eggs or soy No past problems with anesthesia No diet meds No home oxygen  No internet 

## 2015-06-19 ENCOUNTER — Encounter: Payer: Self-pay | Admitting: Gastroenterology

## 2015-06-30 ENCOUNTER — Encounter: Payer: Self-pay | Admitting: Gastroenterology

## 2015-06-30 ENCOUNTER — Ambulatory Visit (AMBULATORY_SURGERY_CENTER): Payer: PPO | Admitting: Gastroenterology

## 2015-06-30 VITALS — BP 107/65 | HR 60 | Temp 98.6°F | Resp 13 | Ht 70.0 in | Wt 195.0 lb

## 2015-06-30 DIAGNOSIS — Z86010 Personal history of colonic polyps: Secondary | ICD-10-CM

## 2015-06-30 DIAGNOSIS — I10 Essential (primary) hypertension: Secondary | ICD-10-CM | POA: Diagnosis not present

## 2015-06-30 DIAGNOSIS — D122 Benign neoplasm of ascending colon: Secondary | ICD-10-CM

## 2015-06-30 DIAGNOSIS — Z1211 Encounter for screening for malignant neoplasm of colon: Secondary | ICD-10-CM

## 2015-06-30 DIAGNOSIS — Z800 Family history of malignant neoplasm of digestive organs: Secondary | ICD-10-CM

## 2015-06-30 MED ORDER — SODIUM CHLORIDE 0.9 % IV SOLN: 500.0000 mL | INTRAVENOUS | Status: DC

## 2015-06-30 NOTE — Op Note (Addendum)
[place] Patient Name: Steven [name] Procedure Date: 06/30/2015 1:53 PM MRN: [id number] Endoscopist: Milus Banister , MD Age: 65 Referring MD:  Date of Birth: [birth year] Gender: Male Account #: [id number] Procedure:                Colonoscopy Indications:              Screening in patient at increased risk: Family                            history of 1st-degree relative with colorectal                            cancer Medicines:                Monitored Anesthesia Care Procedure:                Pre-Anesthesia Assessment:                           - Prior to the procedure, a History and Physical                            was performed, and patient medications and                            allergies were reviewed. The patient's tolerance of                            previous anesthesia was also reviewed. The risks                            and benefits of the procedure and the sedation                            options and risks were discussed with the patient.                            All questions were answered, and informed consent                            was obtained. Prior Anticoagulants: The patient has                            taken no previous anticoagulant or antiplatelet                            agents. ASA Grade Assessment: II - A patient with                            mild systemic disease. After reviewing the risks                            and benefits, the patient was deemed in  satisfactory condition to undergo the procedure.                           After obtaining informed consent, the colonoscope                            was passed under direct vision. Throughout the                            procedure, the patient's blood pressure, pulse, and                            oxygen saturations were monitored continuously. The                            Model CF-HQ190L [id number]) scope was introduced                   through the anus and advanced to the the cecum,                            identified by appendiceal orifice and ileocecal                            valve. The colonoscopy was performed without                            difficulty. The patient tolerated the procedure                            well. The quality of the bowel preparation was                            excellent. The ileocecal valve, appendiceal                            orifice, and rectum were photographed. Scope In: 2:01:32 PM Scope Out: 2:13:29 PM Scope Withdrawal Time: 0 hours 9 minutes 3 seconds  Total Procedure Duration: 0 hours 11 minutes 57 seconds  Findings:                 A 5 mm polyp was found in the ascending colon. The                            polyp was sessile. The polyp was removed with a                            cold snare. Resection was complete, but the polyp                            tissue was not retrieved.                           Multiple small and large-mouthed diverticula were  found in the left colon.                           The exam was otherwise without abnormality on                            direct and retroflexion views. Complications:            No immediate complications. Estimated blood loss:                            None. Estimated Blood Loss:     Estimated blood loss: none. Impression:               - One 5 mm polyp in the ascending colon, removed                            with a cold snare. Complete resection. Polyp tissue                            not retrieved.                           - Diverticulosis in the left colon.                           - The examination was otherwise normal on direct                            and retroflexion views. Recommendation:           - Patient has a contact number available for                            emergencies. The signs and symptoms of potential                            delayed  complications were discussed with the                            patient. Return to normal activities tomorrow.                            Written discharge instructions were provided to the                            patient.                           - Resume previous diet.                           - Continue present medications.                           - Repeat colonoscopy in 5 years for screening  purposes. Milus Banister, MD 06/30/2015 2:13:29 PM This report has been signed electronically.

## 2015-06-30 NOTE — Patient Instructions (Signed)
Discharge instructions given. Handouts on polyps and diverticulosis. Resume previous medications. YOU HAD AN ENDOSCOPIC PROCEDURE TODAY AT THE [facility]:   Refer to the procedure report that was given to you for any specific questions about what was found during the examination.  If the procedure report does not answer your questions, please call your gastroenterologist to clarify.  If you requested that your care partner not be given the details of your procedure findings, then the procedure report has been included in a sealed envelope for you to review at your convenience later.  YOU SHOULD EXPECT: Some feelings of bloating in the abdomen. Passage of more gas than usual.  Walking can help get rid of the air that was put into your GI tract during the procedure and reduce the bloating. If you had a lower endoscopy (such as a colonoscopy or flexible sigmoidoscopy) you may notice spotting of blood in your stool or on the toilet paper. If you underwent a bowel prep for your procedure, you may not have a normal bowel movement for a few days.  Please Note:  You might notice some irritation and congestion in your nose or some drainage.  This is from the oxygen used during your procedure.  There is no need for concern and it should clear up in a day or so.  SYMPTOMS TO REPORT IMMEDIATELY:   Following lower endoscopy (colonoscopy or flexible sigmoidoscopy):  Excessive amounts of blood in the stool  Significant tenderness or worsening of abdominal pains  Swelling of the abdomen that is new, acute  Fever of 100F or higher   For urgent or emergent issues, a gastroenterologist can be reached at any hour by calling [phone].   DIET: Your first meal following the procedure should be a small meal and then it is ok to progress to your normal diet. Heavy or fried foods are harder to digest and may make you feel nauseous or bloated.  Likewise, meals heavy in dairy and vegetables can  increase bloating.  Drink plenty of fluids but you should avoid alcoholic beverages for 24 hours.  ACTIVITY:  You should plan to take it easy for the rest of today and you should NOT DRIVE or use heavy machinery until tomorrow (because of the sedation medicines used during the test).    FOLLOW UP: Our staff will call the number listed on your records the next business day following your procedure to check on you and address any questions or concerns that you may have regarding the information given to you following your procedure. If we do not reach you, we will leave a message.  However, if you are feeling well and you are not experiencing any problems, there is no need to return our call.  We will assume that you have returned to your regular daily activities without incident.  If any biopsies were taken you will be contacted by phone or by letter within the next 1-3 weeks.  Please call us at [phone] if you have not heard about the biopsies in 3 weeks.    SIGNATURES/CONFIDENTIALITY: You and/or your care partner have signed paperwork which will be entered into your electronic medical record.  These signatures attest to the fact that that the information above on your After Visit Summary has been reviewed and is understood.  Full responsibility of the confidentiality of this discharge information lies with you and/or your care-partner. 

## 2015-06-30 NOTE — Progress Notes (Signed)
Called to room to assist during endoscopic procedure.  Patient ID and intended procedure confirmed with present staff. Received instructions for my participation in the procedure from the performing physician.  

## 2015-06-30 NOTE — Progress Notes (Signed)
A/ox3, pleased with MAC, report to RN 

## 2015-07-01 ENCOUNTER — Telehealth: Payer: Self-pay

## 2015-07-01 NOTE — Telephone Encounter (Signed)
  Follow up Call-  Call back number 06/30/2015  Post procedure Call Back phone  # [phone]  Permission to leave phone message Yes    Patient was called for follow up after his procedure on 06/30/2015. No answer at the number given for follow up phone call. A message was left on the answering machine.

## 2015-07-03 HISTORY — PX: COLONOSCOPY: SHX174

## 2015-07-08 ENCOUNTER — Telehealth: Payer: Self-pay | Admitting: Family Medicine

## 2015-07-08 NOTE — Telephone Encounter (Signed)
Pt needs a refill on zetia 10 mg #90 w/refills send to [facility]

## 2015-07-09 MED ORDER — EZETIMIBE 10 MG PO TABS: 10.0000 mg | ORAL_TABLET | Freq: Every day | ORAL | Status: DC

## 2015-07-09 NOTE — Telephone Encounter (Signed)
I sent script e-scribe to mail order.  

## 2015-07-17 ENCOUNTER — Telehealth: Payer: Self-pay | Admitting: Family Medicine

## 2015-07-17 MED ORDER — RABEPRAZOLE SODIUM 20 MG PO TBEC: 20.0000 mg | DELAYED_RELEASE_TABLET | Freq: Two times a day (BID) | ORAL | Status: DC

## 2015-07-17 NOTE — Telephone Encounter (Signed)
done

## 2015-08-08 ENCOUNTER — Ambulatory Visit (INDEPENDENT_AMBULATORY_CARE_PROVIDER_SITE_OTHER): Payer: PPO | Admitting: Family Medicine

## 2015-08-08 VITALS — BP 127/69 | HR 70 | Temp 98.7°F | Ht 70.0 in | Wt 196.0 lb

## 2015-08-08 DIAGNOSIS — G500 Trigeminal neuralgia: Secondary | ICD-10-CM

## 2015-08-08 MED ORDER — GABAPENTIN 300 MG PO CAPS: 300.0000 mg | ORAL_CAPSULE | Freq: Three times a day (TID) | ORAL | 0 refills | Status: DC

## 2015-08-08 NOTE — Progress Notes (Signed)
Pre visit review using our clinic review tool, if applicable. No additional management support is needed unless otherwise documented below in the visit note. 

## 2015-08-11 ENCOUNTER — Encounter: Payer: Self-pay | Admitting: Family Medicine

## 2015-08-11 NOTE — Progress Notes (Signed)
   Subjective:    Patient ID: Steven [name], male    DOB: 1950/12/22, 65 y.o.   MRN: [id number]  HPI Here for one week of intermittent sharp pains in the right side of the head and face, and they sometimes feel like they are behind the right eye. No blurred vision, no neurologic deficits. No nausea. He did feels a little dizzy a few days ago but that went away. His skin feels extra sensitive on te right side of the face and temple. No photophobia.    Review of Systems  Constitutional: Negative.   Eyes: Negative.   Respiratory: Negative.   Cardiovascular: Negative.   Neurological: Positive for dizziness and headaches. Negative for tremors, seizures, syncope, facial asymmetry, speech difficulty, weakness, light-headedness and numbness.       Objective:   Physical Exam  Constitutional: He is oriented to person, place, and time. He appears well-developed and well-nourished. No distress.  HENT:  Head: Normocephalic and atraumatic.  Right Ear: External ear normal.  Left Ear: External ear normal.  Nose: Nose normal.  Mouth/Throat: Oropharynx is clear and moist.  Eyes: Conjunctivae and EOM are normal. Pupils are equal, round, and reactive to light.  Neck: Normal range of motion. Neck supple. No thyromegaly present.  Cardiovascular: Normal rate, regular rhythm, normal heart sounds and intact distal pulses.   Pulmonary/Chest: Effort normal and breath sounds normal.  Lymphadenopathy:    He has no cervical adenopathy.  Neurological: He is alert and oriented to person, place, and time. No cranial nerve deficit. He exhibits normal muscle tone. Coordination normal.  Skin: No rash noted.          Assessment & Plan:  Trigeminal neuralgia, treat with Gabapentin.  Laurey Morale, MD

## 2015-09-02 ENCOUNTER — Encounter: Payer: Self-pay | Admitting: Family Medicine

## 2015-09-02 ENCOUNTER — Ambulatory Visit (INDEPENDENT_AMBULATORY_CARE_PROVIDER_SITE_OTHER): Payer: PPO | Admitting: Family Medicine

## 2015-09-02 VITALS — BP 138/80 | HR 69 | Temp 98.7°F | Ht 70.0 in | Wt 196.0 lb

## 2015-09-02 DIAGNOSIS — J019 Acute sinusitis, unspecified: Secondary | ICD-10-CM

## 2015-09-02 MED ORDER — LEVOFLOXACIN 500 MG PO TABS: 500.0000 mg | ORAL_TABLET | Freq: Every day | ORAL | 0 refills | Status: AC

## 2015-09-02 NOTE — Progress Notes (Signed)
   Subjective:    Patient ID: Steven [name], male    DOB: [birth year], 65 y.o.   MRN: [id number]  HPI Here for 5 days of sinus pressure, PND, headache, and coughing up green sputum. On Alka Seltzer Plus.    Review of Systems  Constitutional: Positive for fever.  HENT: Positive for congestion, facial swelling, postnasal drip and sinus pressure. Negative for sore throat.   Respiratory: Positive for cough.        Objective:   Physical Exam  Constitutional: He appears well-developed and well-nourished.  HENT:  Right Ear: External ear normal.  Left Ear: External ear normal.  Nose: Nose normal.  Mouth/Throat: Oropharynx is clear and moist.  Eyes: Conjunctivae are normal.  Neck: No thyromegaly present.  Pulmonary/Chest: Effort normal and breath sounds normal.  Lymphadenopathy:    He has no cervical adenopathy.          Assessment & Plan:  Sinusitis, treat with Levaquin. Laurey Morale, MD

## 2015-09-02 NOTE — Progress Notes (Signed)
Pre visit review using our clinic review tool, if applicable. No additional management support is needed unless otherwise documented below in the visit note. 

## 2015-09-04 ENCOUNTER — Other Ambulatory Visit: Payer: Self-pay | Admitting: Family Medicine

## 2015-09-11 ENCOUNTER — Telehealth: Payer: Self-pay | Admitting: Family Medicine

## 2015-09-11 NOTE — Telephone Encounter (Signed)
Pt request refill  fenofibrate (TRICOR) 145 MG tablet  90 days  [facility]

## 2015-09-16 MED ORDER — FENOFIBRATE 145 MG PO TABS
145.0000 mg | ORAL_TABLET | Freq: Every evening | ORAL | 3 refills | Status: DC
Start: 2015-09-16 — End: 2016-09-07

## 2015-09-16 NOTE — Telephone Encounter (Signed)
I sent script e-scribe to below pharmacy.  

## 2015-10-03 DIAGNOSIS — J302 Other seasonal allergic rhinitis: Secondary | ICD-10-CM | POA: Diagnosis not present

## 2015-10-03 DIAGNOSIS — R51 Headache: Secondary | ICD-10-CM | POA: Diagnosis not present

## 2015-10-07 ENCOUNTER — Ambulatory Visit (INDEPENDENT_AMBULATORY_CARE_PROVIDER_SITE_OTHER): Payer: PPO

## 2015-10-07 DIAGNOSIS — Z23 Encounter for immunization: Secondary | ICD-10-CM

## 2015-10-17 DIAGNOSIS — R51 Headache: Secondary | ICD-10-CM | POA: Diagnosis not present

## 2015-10-22 DIAGNOSIS — M7542 Impingement syndrome of left shoulder: Secondary | ICD-10-CM | POA: Diagnosis not present

## 2015-10-27 ENCOUNTER — Telehealth: Payer: Self-pay | Admitting: Family Medicine

## 2015-10-27 DIAGNOSIS — G500 Trigeminal neuralgia: Secondary | ICD-10-CM

## 2015-10-27 NOTE — Telephone Encounter (Addendum)
Pt needs a referral to neurologist . Pt had ct scan and sinuses was negative. Pt was last seen in aug 2017

## 2015-10-28 ENCOUNTER — Other Ambulatory Visit (INDEPENDENT_AMBULATORY_CARE_PROVIDER_SITE_OTHER): Payer: PPO

## 2015-10-28 DIAGNOSIS — Z0000 Encounter for general adult medical examination without abnormal findings: Secondary | ICD-10-CM

## 2015-10-28 LAB — POC URINALSYSI DIPSTICK (AUTOMATED)
BILIRUBIN UA: NEGATIVE
GLUCOSE UA: NEGATIVE
KETONES UA: NEGATIVE
Leukocytes, UA: NEGATIVE
Nitrite, UA: NEGATIVE
PH UA: 7
Protein, UA: NEGATIVE
RBC UA: NEGATIVE
Spec Grav, UA: 1.015
Urobilinogen, UA: 0.2

## 2015-10-28 LAB — LIPID PANEL
CHOL/HDL RATIO: 4
Cholesterol: 204 mg/dL — ABNORMAL HIGH (ref 0–200)
HDL: 45.7 mg/dL (ref >39.00)
LDL CALC: 130 mg/dL — AB (ref 0–99)
NONHDL: 158.24
Triglycerides: 139 mg/dL (ref 0.0–149.0)
VLDL: 27.8 mg/dL (ref 0.0–40.0)

## 2015-10-28 LAB — BASIC METABOLIC PANEL
BUN: 26 mg/dL — AB (ref 6–23)
CHLORIDE: 101 meq/L (ref 96–112)
CO2: 31 mEq/L (ref 19–32)
CREATININE: 1.46 mg/dL (ref 0.40–1.50)
Calcium: 10.2 mg/dL (ref 8.4–10.5)
GFR: 51.19 mL/min — AB (ref >60.00)
GLUCOSE: 90 mg/dL (ref 70–99)
POTASSIUM: 5.3 meq/L — AB (ref 3.5–5.1)
Sodium: 138 mEq/L (ref 135–145)

## 2015-10-28 LAB — CBC WITH DIFFERENTIAL/PLATELET
BASOS PCT: 0.6 % (ref 0.0–3.0)
Basophils Absolute: 0 10*3/uL (ref 0.0–0.1)
EOS ABS: 0.1 10*3/uL (ref 0.0–0.7)
Eosinophils Relative: 1.6 % (ref 0.0–5.0)
HCT: 47.2 % (ref 39.0–52.0)
HEMOGLOBIN: 16.1 g/dL (ref 13.0–17.0)
LYMPHS ABS: 1.9 10*3/uL (ref 0.7–4.0)
LYMPHS PCT: 26.4 % (ref 12.0–46.0)
MCHC: 34.1 g/dL (ref 30.0–36.0)
MCV: 85.6 fl (ref 78.0–100.0)
MONO ABS: 0.7 10*3/uL (ref 0.1–1.0)
MONOS PCT: 10.1 % (ref 3.0–12.0)
NEUTROS ABS: 4.5 10*3/uL (ref 1.4–7.7)
Neutrophils Relative %: 61.3 % (ref 43.0–77.0)
Platelets: 282 10*3/uL (ref 150.0–400.0)
RBC: 5.52 Mil/uL (ref 4.22–5.81)
RDW: 13.9 % (ref 11.5–15.5)
WBC: 7.3 10*3/uL (ref 4.0–10.5)

## 2015-10-28 LAB — HEPATIC FUNCTION PANEL
ALK PHOS: 58 U/L (ref 39–117)
ALT: 24 U/L (ref 0–53)
AST: 21 U/L (ref 0–37)
Albumin: 4.9 g/dL (ref 3.5–5.2)
BILIRUBIN DIRECT: 0.1 mg/dL (ref 0.0–0.3)
Total Bilirubin: 0.7 mg/dL (ref 0.2–1.2)
Total Protein: 7.7 g/dL (ref 6.0–8.3)

## 2015-10-28 LAB — PSA: PSA: 0.43 ng/mL (ref 0.10–4.00)

## 2015-10-28 LAB — TSH: TSH: 3.01 u[IU]/mL (ref 0.35–4.50)

## 2015-10-28 NOTE — Telephone Encounter (Signed)
Referral was done  

## 2015-10-30 ENCOUNTER — Other Ambulatory Visit: Payer: PPO

## 2015-10-31 NOTE — Telephone Encounter (Signed)
I spoke with pt and went over below information. 

## 2015-11-04 ENCOUNTER — Ambulatory Visit (INDEPENDENT_AMBULATORY_CARE_PROVIDER_SITE_OTHER): Payer: PPO | Admitting: Family Medicine

## 2015-11-04 ENCOUNTER — Encounter: Payer: Self-pay | Admitting: Family Medicine

## 2015-11-04 VITALS — BP 127/70 | HR 62 | Temp 98.5°F | Ht 70.0 in | Wt 195.0 lb

## 2015-11-04 DIAGNOSIS — Z0000 Encounter for general adult medical examination without abnormal findings: Secondary | ICD-10-CM | POA: Diagnosis not present

## 2015-11-04 MED ORDER — FIRST-DUKES MOUTHWASH MT SUSP: 5.0000 mL | Freq: Four times a day (QID) | OROMUCOSAL | 1 refills | Status: DC | PRN

## 2015-11-04 NOTE — Progress Notes (Signed)
Pre visit review using our clinic review tool, if applicable. No additional management support is needed unless otherwise documented below in the visit note. 

## 2015-11-04 NOTE — Progress Notes (Signed)
   Subjective:    Patient ID: Steven [name], male    DOB: 12/30/1950, 65 y.o.   MRN: [id number]  HPI 65 yr old male for a well exam. He feels good in general. He still has the right sided headaches we think may be from trigeminal neuralgia, but they have improved a lot in the past few weeks. He will see Dr. Tomi Likens about this on 11-18-15. He also mentions noticing some mild tremors in the right hand over the past 3 months.    Review of Systems  Constitutional: Negative.   Eyes: Negative.   Respiratory: Negative.   Cardiovascular: Negative.   Gastrointestinal: Negative.   Genitourinary: Negative.   Musculoskeletal: Negative.   Skin: Negative.   Neurological: Positive for tremors and headaches. Negative for dizziness, seizures, syncope, facial asymmetry, speech difficulty, weakness, light-headedness and numbness.  Psychiatric/Behavioral: Negative.        Objective:   Physical Exam  Constitutional: He is oriented to person, place, and time. He appears well-developed and well-nourished. No distress.  HENT:  Head: Normocephalic and atraumatic.  Right Ear: External ear normal.  Left Ear: External ear normal.  Nose: Nose normal.  Mouth/Throat: Oropharynx is clear and moist. No oropharyngeal exudate.  Eyes: Conjunctivae and EOM are normal. Pupils are equal, round, and reactive to light. Right eye exhibits no discharge. Left eye exhibits no discharge. No scleral icterus.  Neck: Neck supple. No JVD present. No tracheal deviation present. No thyromegaly present.  Cardiovascular: Normal rate, regular rhythm, normal heart sounds and intact distal pulses.  Exam reveals no gallop and no friction rub.   No murmur heard. EKG normal   Pulmonary/Chest: Effort normal and breath sounds normal. No respiratory distress. He has no wheezes. He has no rales. He exhibits no tenderness.  Abdominal: Soft. Bowel sounds are normal. He exhibits no distension and no mass. There is no tenderness. There is no rebound  and no guarding.  Genitourinary: Rectum normal, prostate normal and penis normal. Rectal exam shows guaiac negative stool. No penile tenderness.  Musculoskeletal: Normal range of motion. He exhibits no edema or tenderness.  Lymphadenopathy:    He has no cervical adenopathy.  Neurological: He is alert and oriented to person, place, and time. He has normal reflexes. No cranial nerve deficit. He exhibits normal muscle tone. Coordination normal.  He does have a slight resting tremor in the right hand, and I observe slight fasciculations in the left hand as well   Skin: Skin is warm and dry. No rash noted. He is not diaphoretic. No erythema. No pallor.  Psychiatric: He has a normal mood and affect. His behavior is normal. Judgment and thought content normal.          Assessment & Plan:  Well exam. We discussed diet and exercise. He will see Dr. Tomi Likens as noted for a neurologic evaluation.  Laurey Morale, MD

## 2015-11-18 ENCOUNTER — Ambulatory Visit: Payer: PPO | Admitting: Neurology

## 2015-12-30 DIAGNOSIS — M545 Low back pain: Secondary | ICD-10-CM | POA: Diagnosis not present

## 2015-12-30 DIAGNOSIS — M25512 Pain in left shoulder: Secondary | ICD-10-CM | POA: Diagnosis not present

## 2016-01-09 DIAGNOSIS — M25512 Pain in left shoulder: Secondary | ICD-10-CM | POA: Diagnosis not present

## 2016-01-20 DIAGNOSIS — M25512 Pain in left shoulder: Secondary | ICD-10-CM | POA: Diagnosis not present

## 2016-01-20 DIAGNOSIS — S43432A Superior glenoid labrum lesion of left shoulder, initial encounter: Secondary | ICD-10-CM | POA: Diagnosis not present

## 2016-01-20 DIAGNOSIS — M545 Low back pain: Secondary | ICD-10-CM | POA: Diagnosis not present

## 2016-02-18 ENCOUNTER — Other Ambulatory Visit: Payer: Self-pay | Admitting: Family Medicine

## 2016-02-24 DIAGNOSIS — M545 Low back pain: Secondary | ICD-10-CM | POA: Diagnosis not present

## 2016-02-24 DIAGNOSIS — M25512 Pain in left shoulder: Secondary | ICD-10-CM | POA: Diagnosis not present

## 2016-03-01 ENCOUNTER — Telehealth: Payer: Self-pay | Admitting: Family Medicine

## 2016-03-01 NOTE — Telephone Encounter (Signed)
Pt was with the granddaughter this weekend and the G-daughter was Dx with the influenza and would like to have Tamiflu   Pharm:  [facility] in [place]. [address].

## 2016-03-01 NOTE — Telephone Encounter (Signed)
Call in Tamiflu 75 mg bid for 5 days  

## 2016-03-02 MED ORDER — OSELTAMIVIR PHOSPHATE 75 MG PO CAPS: 75.0000 mg | ORAL_CAPSULE | Freq: Two times a day (BID) | ORAL | 0 refills | Status: DC

## 2016-03-02 NOTE — Telephone Encounter (Signed)
Rx sent 

## 2016-03-12 DIAGNOSIS — L300 Nummular dermatitis: Secondary | ICD-10-CM | POA: Diagnosis not present

## 2016-04-07 DIAGNOSIS — M545 Low back pain: Secondary | ICD-10-CM | POA: Diagnosis not present

## 2016-04-16 ENCOUNTER — Encounter: Payer: Self-pay | Admitting: Neurology

## 2016-04-16 ENCOUNTER — Ambulatory Visit (INDEPENDENT_AMBULATORY_CARE_PROVIDER_SITE_OTHER): Payer: PPO | Admitting: Neurology

## 2016-04-16 VITALS — BP 118/72 | HR 62 | Ht 70.0 in | Wt 199.4 lb

## 2016-04-16 DIAGNOSIS — G250 Essential tremor: Secondary | ICD-10-CM

## 2016-04-16 DIAGNOSIS — G500 Trigeminal neuralgia: Secondary | ICD-10-CM | POA: Diagnosis not present

## 2016-04-16 NOTE — Progress Notes (Signed)
NEUROLOGY CONSULTATION NOTE  Steven [name] MRN: [id number] DOB: March 18, 1950  Referring provider: Dr. Sarajane Jews  Primary care provider: Dr. Sarajane Jews  Reason for consult:  Trigeminal neuralgia, tremor  HISTORY OF PRESENT ILLNESS: Steven [name] is a 66 year old right-handed man with hyperlipidemia, GERD and history of neck surgery who presents for headache and tremor.  He is accompanied by his wife who supplements history..  In July, he developed sharp paroxysmal pain involving the right side side of his head and face, radiating into the right ear to behind the right eye and up to the top of his head .  It lasts seconds at a time and would occur throughout the day.  Frequency varies as it would come and go.  It may occur for 2 weeks and may be pain-free for several weeks.  There is no associated nausea or photophobia/phonophobia.  He denies rash.  Sometimes, he notes some blurred vision.  It comes and goes spontaneously, not triggered or relieved by anything particular.  He was diagnosed with trigeminal neuralgia and prescribed gabapentin, which caused swelling.  He later developed sinus pressure/congestion, postnasal drip and facial swelling.  He was prescribed levofloxacin for acute sinusitis.  He had CT of the sinuses on 10/17/15, which did not reveal sinusitis.  He hasn't had any pain for a little over a week.  Also, he developed a tremor 6 months ago in his right hand.  It is intermittent and he notices it when he holds a fork or pen.  He denies difficulty with movement or walking.  It comes and goes spontaneously.  His brother has tremor.  Labs from 10/28/15:  CBC with WBC 7.3, HGB 16.1, HCT 47.2 and PLT 282; BMP with Na 138, K 5.3, Cl 101, glucose 90, BUN 26 and Cr 1.46; Hepatic panel with total bili 0.7, ALP 58, AST 21 and ALT 24.  PAST MEDICAL HISTORY: Past Medical History:  Diagnosis Date  . Clotting disorder ([place])   . Displacement of intervertebral disc, site unspecified, without myelopathy     . Esophageal reflux   . Headache(784.0)    sinus and migraines  . Herpes simplex without mention of complication   . Hiatal hernia   . HTN (hypertension) 05/10/2012  . Hyperlipemia   . Hypertrophy (benign) of prostate   . Intestinal infection due to other organism, not elsewhere classified   . Kidney stones    sees Dr. Junious Silk  . Personal history of colonic polyps   . Personal history of urinary calculi   . Personal history of venous thrombosis and embolism     PAST SURGICAL HISTORY: Past Surgical History:  Procedure Laterality Date  . APPENDECTOMY    . BOTOX INJECTION N/A 04/13/2012   Procedure: BOTOX INJECTION;  Surgeon: Milus Banister, MD;  Location: [facility];  Service: Endoscopy;  Laterality: N/A;  . [place]  . CHOLECYSTECTOMY    . COLONOSCOPY  07/03/2015   per Dr. Ardis Hughs, single polyp removed but not retrieved,  repeat in 5 yrs   . ESOPHAGEAL MANOMETRY N/A 02/26/2013   Procedure: ESOPHAGEAL MANOMETRY (EM);  Surgeon: Milus Banister, MD;  Location: [facility];  Service: Endoscopy;  Laterality: N/A;  . ESOPHAGOGASTRODUODENOSCOPY  02-24-10   per Dr. Ardis Hughs, small hiatal hernia only   . ESOPHAGOGASTRODUODENOSCOPY (EGD) WITH ESOPHAGEAL DILATION N/A 04/13/2012   Procedure: ESOPHAGOGASTRODUODENOSCOPY (EGD) WITH ESOPHAGEAL DILATION;  Surgeon: Milus Banister, MD;  Location: [facility];  Service: Endoscopy;  Laterality: N/A;  possible botox injection  . FINGER SURGERY     3rd finger trigger bilateral   . GALLBLADDER SURGERY    . LITHOTRIPSY    . ROTATOR CUFF REPAIR Right 01-10-12   per Dr. Esmond Plants, also repair torn biceps tendon   . STERIOD INJECTION Left 07/27/2013   Procedure: STEROID INJECTION;  Surgeon: Augustin Schooling, MD;  Location: [place];  Service: Orthopedics;  Laterality: Left;  . TONSILLECTOMY    . TRIGGER FINGER RELEASE Right 07/27/2013   Procedure: RIGHT RING FINGER A-1 RELEASE;  Surgeon: Augustin Schooling, MD;  Location: [facility];   Service: Orthopedics;  Laterality: Right;  . URETERAL STENT PLACEMENT  July 2012   left ureter, per Dr. Junious Silk, for a stone     MEDICATIONS: Current Outpatient Prescriptions on File Prior to Visit  Medication Sig Dispense Refill  . aspirin EC 81 MG tablet Take 81 mg by mouth daily.    . Diphenhyd-Hydrocort-Nystatin (FIRST-DUKES MOUTHWASH) SUSP Use as directed 5 mLs in the mouth or throat every 6 (six) hours as needed (ulcers). Swish and spit 237 mL 1  . eletriptan (RELPAX) 20 MG tablet Take 1 tablet (20 mg total) by mouth as needed for migraine. 90 tablet 3  . ezetimibe (ZETIA) 10 MG tablet Take 1 tablet (10 mg total) by mouth daily. 90 tablet 3  . fenofibrate (TRICOR) 145 MG tablet Take 1 tablet (145 mg total) by mouth every evening. 90 tablet 3  . folic acid (FOLVITE) 1 MG tablet Take 1 mg by mouth daily.    Marland Kitchen losartan (COZAAR) 50 MG tablet Take 1 tablet by mouth daily 90 tablet 3  . RABEprazole (ACIPHEX) 20 MG tablet Take 1 tablet (20 mg total) by mouth 2 (two) times daily. 180 tablet 3  . temazepam (RESTORIL) 30 MG capsule Take 1 capsule (30 mg total) by mouth at bedtime as needed for sleep. 90 capsule 1  . acyclovir cream (ZOVIRAX) 5 % Apply 1 application topically daily as needed (fever blisters). (Patient not taking: Reported on 11/04/2015) 15 g 3  . gabapentin (NEURONTIN) 300 MG capsule TAKE 1 CAPSULE (300 MG TOTAL) BY MOUTH 3 (THREE) TIMES DAILY. (Patient not taking: Reported on 04/16/2016) 90 capsule 5   No current facility-administered medications on file prior to visit.     ALLERGIES: Allergies  Allergen Reactions  . Amitriptyline Hcl Other (See Comments)    Mood changes  . Amoxicillin-Pot Clavulanate     REACTION: nausea  . Cyclobenzaprine Hcl     unknown  . Levofloxacin Swelling  . Lisinopril-Hydrochlorothiazide     depression  . Morphine     headache  . Prednisone Other (See Comments)    Blurred vision    FAMILY HISTORY: Family History  Problem Relation Age  of Onset  . Colon cancer Father   . Heart attack Father   . Heart disease Brother   . Heart disease Brother     SOCIAL HISTORY: Social History   Social History  . Marital status: Married    Spouse name: N/A  . Number of children: N/A  . Years of education: N/A   Occupational History  . Retired    Social History Main Topics  . Smoking status: Former Smoker    Packs/day: 1.50    Years: 20.00    Types: Cigarettes    Quit date: 03/03/1973  . Smokeless tobacco: Never Used  . Alcohol use No  . Drug use: No  . Sexual activity:  Not on file   Other Topics Concern  . Not on file   Social History Narrative  . No narrative on file    REVIEW OF SYSTEMS: Constitutional: No fevers, chills, or sweats, no generalized fatigue, change in appetite Eyes: No visual changes, double vision, eye pain Ear, nose and throat: No hearing loss, ear pain, nasal congestion, sore throat Cardiovascular: No chest pain, palpitations Respiratory:  No shortness of breath at rest or with exertion, wheezes GastrointestinaI: No nausea, vomiting, diarrhea, abdominal pain, fecal incontinence Genitourinary:  No dysuria, urinary retention or frequency Musculoskeletal:  No neck pain, back pain Integumentary: No rash, pruritus, skin lesions Neurological: as above Psychiatric: No depression, insomnia, anxiety Endocrine: No palpitations, fatigue, diaphoresis, mood swings, change in appetite, change in weight, increased thirst Hematologic/Lymphatic:  No purpura, petechiae. Allergic/Immunologic: no itchy/runny eyes, nasal congestion, recent allergic reactions, rashes  PHYSICAL EXAM: Vitals:   04/16/16 0957  BP: 118/72  Pulse: 62   General: No acute distress.  Patient appears well-groomed.  Head:  Normocephalic/atraumatic Eyes:  fundi examined but not visualized Neck: supple, no paraspinal tenderness, full range of motion Back: No paraspinal tenderness Heart: regular rate and rhythm Lungs: Clear to  auscultation bilaterally. Vascular: No carotid bruits. Neurological Exam: Mental status: alert and oriented to person, place, and time, recent and remote memory intact, fund of knowledge intact, attention and concentration intact, speech fluent and not dysarthric, language intact. Cranial nerves: CN I: not tested CN II: pupils equal, round and reactive to light, visual fields intact CN III, IV, VI:  full range of motion, no nystagmus, no ptosis CN V: facial sensation intact CN VII: upper and lower face symmetric CN VIII: hearing intact CN IX, X: gag intact, uvula midline CN XI: sternocleidomastoid and trapezius muscles intact CN XII: tongue midline Bulk & Tone: normal, no fasciculations. Motor:  5/5 throughout  Sensation: temperature and vibration sensation intact. Deep Tendon Reflexes:  2+ throughout, toes downgoing.  Finger to nose testing:  Without dysmetria.  Heel to shin:  Without dysmetria.  Gait:  Normal station and stride.  Able to turn and tandem walk. [place] .  IMPRESSION: Right sided trigeminal neuralgia Benign essential tremor, not appreciated on exam.  Mild  PLAN: 1.  We discussed treatment options, such as oxcarbazepine.  At this time he declines treatment.  He says the pain is bearable and doesn't occur often enough.  He will monitor. 2.  Tremor at this time does not interfere with quality of life.  He will monitor. 3.  If symptoms of either condition worsen, he will contact me and we can start treatment.  Otherwise, follow up as needed.  Thank you for allowing me to take part in the care of this patient.  Metta Clines, DO  CC:  Alysia Penna, MD

## 2016-04-16 NOTE — Patient Instructions (Signed)
I believe that you have trigeminal neuralgia.  If symptoms get worse, contact me and we can start a medication The tremor is called benign essential tremor.  Often it runs in families. If it is not bothersome, I wouldn't do anything for it.  If it interferes with your quality of life, there are medications we can try for that as well Follow up as needed.

## 2016-04-20 DIAGNOSIS — M545 Low back pain: Secondary | ICD-10-CM | POA: Diagnosis not present

## 2016-05-04 DIAGNOSIS — M545 Low back pain: Secondary | ICD-10-CM | POA: Diagnosis not present

## 2016-05-04 DIAGNOSIS — M7542 Impingement syndrome of left shoulder: Secondary | ICD-10-CM | POA: Diagnosis not present

## 2016-05-04 DIAGNOSIS — S43432D Superior glenoid labrum lesion of left shoulder, subsequent encounter: Secondary | ICD-10-CM | POA: Diagnosis not present

## 2016-05-12 DIAGNOSIS — M545 Low back pain: Secondary | ICD-10-CM | POA: Diagnosis not present

## 2016-05-20 DIAGNOSIS — M545 Low back pain: Secondary | ICD-10-CM | POA: Diagnosis not present

## 2016-06-14 ENCOUNTER — Ambulatory Visit: Payer: PPO | Admitting: Neurology

## 2016-06-16 ENCOUNTER — Other Ambulatory Visit: Payer: Self-pay | Admitting: Family Medicine

## 2016-07-19 ENCOUNTER — Ambulatory Visit (HOSPITAL_COMMUNITY)
Admission: RE | Admit: 2016-07-19 | Discharge: 2016-07-19 | Disposition: A | Discharge status: Home / Self Care | Payer: PPO | Source: Ambulatory Visit | Attending: Cardiovascular Disease | Admitting: Cardiovascular Disease

## 2016-07-19 ENCOUNTER — Observation Stay (HOSPITAL_COMMUNITY): Admit: 2016-07-19 | Payer: PPO

## 2016-07-19 ENCOUNTER — Telehealth: Payer: Self-pay | Admitting: Emergency Medicine

## 2016-07-19 ENCOUNTER — Ambulatory Visit (INDEPENDENT_AMBULATORY_CARE_PROVIDER_SITE_OTHER)
Admission: RE | Admit: 2016-07-19 | Discharge: 2016-07-19 | Disposition: A | Discharge status: Home / Self Care | Payer: PPO | Source: Ambulatory Visit | Attending: Family Medicine | Admitting: Family Medicine

## 2016-07-19 ENCOUNTER — Observation Stay (HOSPITAL_COMMUNITY)
Admission: EM | Admit: 2016-07-19 | Discharge: 2016-07-20 | Disposition: A | Discharge status: Home / Self Care | Payer: PPO | Attending: Family Medicine | Admitting: Family Medicine

## 2016-07-19 ENCOUNTER — Ambulatory Visit (INDEPENDENT_AMBULATORY_CARE_PROVIDER_SITE_OTHER): Payer: PPO | Admitting: Family Medicine

## 2016-07-19 ENCOUNTER — Encounter: Payer: Self-pay | Admitting: Family Medicine

## 2016-07-19 ENCOUNTER — Other Ambulatory Visit: Payer: Self-pay

## 2016-07-19 ENCOUNTER — Encounter (HOSPITAL_COMMUNITY): Payer: Self-pay | Admitting: Emergency Medicine

## 2016-07-19 VITALS — BP 136/83 | HR 65 | Temp 98.1°F | Ht 70.0 in | Wt 199.0 lb

## 2016-07-19 DIAGNOSIS — G43909 Migraine, unspecified, not intractable, without status migrainosus: Secondary | ICD-10-CM | POA: Diagnosis not present

## 2016-07-19 DIAGNOSIS — Z86718 Personal history of other venous thrombosis and embolism: Secondary | ICD-10-CM | POA: Diagnosis not present

## 2016-07-19 DIAGNOSIS — R0789 Other chest pain: Secondary | ICD-10-CM | POA: Diagnosis present

## 2016-07-19 DIAGNOSIS — G609 Hereditary and idiopathic neuropathy, unspecified: Secondary | ICD-10-CM | POA: Diagnosis present

## 2016-07-19 DIAGNOSIS — K219 Gastro-esophageal reflux disease without esophagitis: Secondary | ICD-10-CM | POA: Diagnosis not present

## 2016-07-19 DIAGNOSIS — I82401 Acute embolism and thrombosis of unspecified deep veins of right lower extremity: Secondary | ICD-10-CM | POA: Diagnosis not present

## 2016-07-19 DIAGNOSIS — N400 Enlarged prostate without lower urinary tract symptoms: Secondary | ICD-10-CM | POA: Insufficient documentation

## 2016-07-19 DIAGNOSIS — Z881 Allergy status to other antibiotic agents status: Secondary | ICD-10-CM | POA: Insufficient documentation

## 2016-07-19 DIAGNOSIS — E785 Hyperlipidemia, unspecified: Secondary | ICD-10-CM | POA: Diagnosis not present

## 2016-07-19 DIAGNOSIS — Z86711 Personal history of pulmonary embolism: Secondary | ICD-10-CM | POA: Diagnosis not present

## 2016-07-19 DIAGNOSIS — I10 Essential (primary) hypertension: Secondary | ICD-10-CM | POA: Insufficient documentation

## 2016-07-19 DIAGNOSIS — B001 Herpesviral vesicular dermatitis: Secondary | ICD-10-CM | POA: Insufficient documentation

## 2016-07-19 DIAGNOSIS — Z86010 Personal history of colonic polyps: Secondary | ICD-10-CM | POA: Insufficient documentation

## 2016-07-19 DIAGNOSIS — R0602 Shortness of breath: Secondary | ICD-10-CM | POA: Diagnosis not present

## 2016-07-19 DIAGNOSIS — I2699 Other pulmonary embolism without acute cor pulmonale: Secondary | ICD-10-CM | POA: Diagnosis not present

## 2016-07-19 DIAGNOSIS — I82409 Acute embolism and thrombosis of unspecified deep veins of unspecified lower extremity: Secondary | ICD-10-CM | POA: Diagnosis present

## 2016-07-19 DIAGNOSIS — Z7901 Long term (current) use of anticoagulants: Secondary | ICD-10-CM | POA: Diagnosis not present

## 2016-07-19 DIAGNOSIS — Z87891 Personal history of nicotine dependence: Secondary | ICD-10-CM | POA: Diagnosis not present

## 2016-07-19 DIAGNOSIS — R079 Chest pain, unspecified: Secondary | ICD-10-CM | POA: Diagnosis not present

## 2016-07-19 DIAGNOSIS — I209 Angina pectoris, unspecified: Secondary | ICD-10-CM | POA: Insufficient documentation

## 2016-07-19 DIAGNOSIS — M79604 Pain in right leg: Secondary | ICD-10-CM | POA: Diagnosis not present

## 2016-07-19 DIAGNOSIS — G608 Other hereditary and idiopathic neuropathies: Secondary | ICD-10-CM | POA: Diagnosis not present

## 2016-07-19 DIAGNOSIS — N289 Disorder of kidney and ureter, unspecified: Secondary | ICD-10-CM

## 2016-07-19 DIAGNOSIS — Z888 Allergy status to other drugs, medicaments and biological substances status: Secondary | ICD-10-CM | POA: Diagnosis not present

## 2016-07-19 DIAGNOSIS — Z823 Family history of stroke: Secondary | ICD-10-CM | POA: Diagnosis not present

## 2016-07-19 DIAGNOSIS — Z7982 Long term (current) use of aspirin: Secondary | ICD-10-CM | POA: Insufficient documentation

## 2016-07-19 DIAGNOSIS — I824Y1 Acute embolism and thrombosis of unspecified deep veins of right proximal lower extremity: Secondary | ICD-10-CM

## 2016-07-19 DIAGNOSIS — Z8249 Family history of ischemic heart disease and other diseases of the circulatory system: Secondary | ICD-10-CM | POA: Insufficient documentation

## 2016-07-19 DIAGNOSIS — Z885 Allergy status to narcotic agent status: Secondary | ICD-10-CM | POA: Insufficient documentation

## 2016-07-19 DIAGNOSIS — Z880 Allergy status to penicillin: Secondary | ICD-10-CM | POA: Insufficient documentation

## 2016-07-19 HISTORY — DX: Disorder of kidney and ureter, unspecified: N28.9

## 2016-07-19 LAB — I-STAT TROPONIN, ED: TROPONIN I, POC: 0 ng/mL (ref 0.00–0.08)

## 2016-07-19 LAB — BASIC METABOLIC PANEL
ANION GAP: 7 (ref 5–15)
BUN: 18 mg/dL (ref 6–23)
BUN: 15 mg/dL (ref 6–20)
CHLORIDE: 102 meq/L (ref 96–112)
CHLORIDE: 103 mmol/L (ref 101–111)
CO2: 28 meq/L (ref 19–32)
CO2: 25 mmol/L (ref 22–32)
Calcium: 9.6 mg/dL (ref 8.4–10.5)
Calcium: 9.4 mg/dL (ref 8.9–10.3)
Creatinine, Ser: 1.36 mg/dL (ref 0.40–1.50)
Creatinine, Ser: 1.28 mg/dL — ABNORMAL HIGH (ref 0.61–1.24)
GFR calc Af Amer: >60 mL/min (ref >60)
GFR, EST NON AFRICAN AMERICAN: 56 mL/min — AB (ref >60)
GFR: 55.43 mL/min — ABNORMAL LOW (ref >60.00)
GLUCOSE: 97 mg/dL (ref 70–99)
GLUCOSE: 81 mg/dL (ref 65–99)
POTASSIUM: 3.9 mmol/L (ref 3.5–5.1)
Potassium: 4.5 mEq/L (ref 3.5–5.1)
SODIUM: 137 meq/L (ref 135–145)
Sodium: 135 mmol/L (ref 135–145)

## 2016-07-19 LAB — TROPONIN I
Troponin I: <0.03 ng/mL (ref <0.03)
Troponin I: <0.03 ng/mL (ref <0.03)

## 2016-07-19 LAB — DIFFERENTIAL
BASOS ABS: 0 10*3/uL (ref 0.0–0.1)
BASOS PCT: 1 %
EOS ABS: 0.2 10*3/uL (ref 0.0–0.7)
EOS PCT: 4 %
Lymphocytes Relative: 31 %
Lymphs Abs: 1.8 10*3/uL (ref 0.7–4.0)
MONOS PCT: 9 %
Monocytes Absolute: 0.5 10*3/uL (ref 0.1–1.0)
Neutro Abs: 3.3 10*3/uL (ref 1.7–7.7)
Neutrophils Relative %: 55 %

## 2016-07-19 LAB — CBC
HCT: 44.5 % (ref 39.0–52.0)
Hemoglobin: 15.4 g/dL (ref 13.0–17.0)
MCH: 29.6 pg (ref 26.0–34.0)
MCHC: 34.6 g/dL (ref 30.0–36.0)
MCV: 85.4 fL (ref 78.0–100.0)
PLATELETS: 236 10*3/uL (ref 150–400)
RBC: 5.21 MIL/uL (ref 4.22–5.81)
RDW: 13 % (ref 11.5–15.5)
WBC: 5.9 10*3/uL (ref 4.0–10.5)

## 2016-07-19 LAB — HEPARIN LEVEL (UNFRACTIONATED): Heparin Unfractionated: 0.66 IU/mL (ref 0.30–0.70)

## 2016-07-19 MED ORDER — ONDANSETRON HCL 4 MG/2ML IJ SOLN: 4.0000 mg | Freq: Four times a day (QID) | INTRAMUSCULAR | Status: DC | PRN

## 2016-07-19 MED ORDER — EZETIMIBE 10 MG PO TABS
10.0000 mg | ORAL_TABLET | Freq: Every day | ORAL | Status: DC
  Administered 2016-07-20: 10 mg via ORAL
  Filled 2016-07-19: qty 1

## 2016-07-19 MED ORDER — HEPARIN BOLUS VIA INFUSION
5500.0000 [IU] | Freq: Once | INTRAVENOUS | Status: AC
  Administered 2016-07-19: 5500 [IU] via INTRAVENOUS
  Filled 2016-07-19: qty 5500

## 2016-07-19 MED ORDER — HEPARIN (PORCINE) IN NACL 100-0.45 UNIT/ML-% IJ SOLN
1450.0000 [IU]/h | INTRAMUSCULAR | Status: DC
  Administered 2016-07-19 – 2016-07-20 (×2): 1550 [IU]/h via INTRAVENOUS
  Filled 2016-07-19 (×2): qty 250

## 2016-07-19 MED ORDER — ACETAMINOPHEN 325 MG PO TABS: 650.0000 mg | ORAL_TABLET | Freq: Four times a day (QID) | ORAL | Status: DC | PRN

## 2016-07-19 MED ORDER — BISACODYL 10 MG RE SUPP: 10.0000 mg | Freq: Every day | RECTAL | Status: DC | PRN

## 2016-07-19 MED ORDER — FENOFIBRATE 160 MG PO TABS
160.0000 mg | ORAL_TABLET | Freq: Every day | ORAL | Status: DC
  Administered 2016-07-20: 160 mg via ORAL
  Filled 2016-07-19: qty 1

## 2016-07-19 MED ORDER — LOSARTAN POTASSIUM 50 MG PO TABS
50.0000 mg | ORAL_TABLET | Freq: Every day | ORAL | Status: DC
  Administered 2016-07-19 – 2016-07-20 (×2): 50 mg via ORAL
  Filled 2016-07-19 (×2): qty 1

## 2016-07-19 MED ORDER — ACETAMINOPHEN 650 MG RE SUPP: 650.0000 mg | Freq: Four times a day (QID) | RECTAL | Status: DC | PRN

## 2016-07-19 MED ORDER — IOPAMIDOL (ISOVUE-370) INJECTION 76%
80.0000 mL | Freq: Once | INTRAVENOUS | Status: AC | PRN
  Administered 2016-07-19: 80 mL via INTRAVENOUS

## 2016-07-19 MED ORDER — GABAPENTIN 300 MG PO CAPS
300.0000 mg | ORAL_CAPSULE | Freq: Three times a day (TID) | ORAL | Status: DC
  Filled 2016-07-19: qty 1

## 2016-07-19 MED ORDER — PANTOPRAZOLE SODIUM 40 MG PO TBEC: 40.0000 mg | DELAYED_RELEASE_TABLET | Freq: Every day | ORAL | Status: DC

## 2016-07-19 MED ORDER — ONDANSETRON HCL 4 MG PO TABS
4.0000 mg | ORAL_TABLET | Freq: Four times a day (QID) | ORAL | Status: DC | PRN
Start: 2016-07-19 — End: 2016-07-20

## 2016-07-19 MED ORDER — TEMAZEPAM 15 MG PO CAPS: 30.0000 mg | ORAL_CAPSULE | Freq: Every evening | ORAL | Status: DC | PRN

## 2016-07-19 MED ORDER — SENNOSIDES-DOCUSATE SODIUM 8.6-50 MG PO TABS: 1.0000 | ORAL_TABLET | Freq: Every evening | ORAL | Status: DC | PRN

## 2016-07-19 MED ORDER — PANTOPRAZOLE SODIUM 40 MG PO TBEC
40.0000 mg | DELAYED_RELEASE_TABLET | Freq: Every day | ORAL | Status: DC
  Administered 2016-07-19 – 2016-07-20 (×2): 40 mg via ORAL
  Filled 2016-07-19 (×2): qty 1

## 2016-07-19 NOTE — ED Notes (Signed)
EDP at bedside  

## 2016-07-19 NOTE — Telephone Encounter (Signed)
Spoke with Varney Biles from [facility] from ultrasound and patient was positive for DVT. Dr. Sarajane Jews is aware and patient was notified to keep Chest CT and Dr. Sarajane Jews will go from there.

## 2016-07-19 NOTE — ED Triage Notes (Signed)
PATIENT HAD OUT PATIENT CT WHICH CONFIRMED BILATERAL PE & RIGHT LEG DVT

## 2016-07-19 NOTE — Progress Notes (Signed)
[place] for Heparin Indication: pulmonary embolus  Allergies  Allergen Reactions  . Morphine Other (See Comments)    headache  . Amitriptyline Hcl Other (See Comments)    Mood changes  . Amoxicillin-Pot Clavulanate     REACTION: nausea  . Cyclobenzaprine Hcl     unknown  . Levofloxacin Swelling  . Lisinopril-Hydrochlorothiazide     depression  . Prednisone Other (See Comments)    Blurred vision    Patient Measurements: Height: 5\' 10"  (177.8 cm) Weight: 194 lb 14.4 oz (88.4 kg) IBW/kg (Calculated) : 73 Heparin Dosing Weight: 90.3 kg  Vital Signs: Temp: 98.4 F (36.9 C) (07/09 2138) Temp Source: Oral (07/09 2138) BP: 132/74 (07/09 2138) Pulse Rate: 69 (07/09 2138)  Labs:  Recent Labs  07/19/16 1137 07/19/16 1603 07/19/16 1759 07/19/16 2236  HGB  --  15.4  --   --   HCT  --  44.5  --   --   PLT  --  236  --   --   HEPARINUNFRC  --   --   --  0.66  CREATININE 1.36 1.28*  --   --   TROPONINI  --   --  <0.03  --     Estimated Creatinine Clearance (by C-G formula based on SCr of 1.28 mg/dL (H)) Male: 51.5 mL/min (A) Male: 62.7 mL/min (A)  Assessment: 66 y.o. male with PE for heparin  Goal of Therapy:  Heparin level 0.3-0.7 units/ml Monitor platelets by anticoagulation protocol: Yes   Plan:  Continue Heparin at current rate   Phillis Knack, PharmD, BCPS  07/19/2016,11:39 PM

## 2016-07-19 NOTE — Progress Notes (Signed)
ANTICOAGULATION CONSULT NOTE - Initial Consult  Pharmacy Consult for Heparin Indication: pulmonary embolus  Allergies  Allergen Reactions  . Amitriptyline Hcl Other (See Comments)    Mood changes  . Amoxicillin-Pot Clavulanate     REACTION: nausea  . Cyclobenzaprine Hcl     unknown  . Levofloxacin Swelling  . Lisinopril-Hydrochlorothiazide     depression  . Morphine     headache  . Prednisone Other (See Comments)    Blurred vision    Patient Measurements:  Weight 90.3 kg Height 177.8 cm Heparin Dosing Weight: 90.3 kg  Vital Signs: Temp: 98.1 F (36.7 C) (07/09 1106) BP: 136/83 (07/09 1106) Pulse Rate: 65 (07/09 1106)  Labs:  Recent Labs  07/19/16 1137  CREATININE 1.36    Estimated Creatinine Clearance (by C-G formula based on SCr of 1.36 mg/dL) Male: 48.9 mL/min Male: 59.6 mL/min   Medical History: Past Medical History:  Diagnosis Date  . Clotting disorder ([place])   . Displacement of intervertebral disc, site unspecified, without myelopathy   . Esophageal reflux   . Headache(784.0)    sinus and migraines  . Herpes simplex without mention of complication   . Hiatal hernia   . HTN (hypertension) 05/10/2012  . Hyperlipemia   . Hypertrophy (benign) of prostate   . Intestinal infection due to other organism, not elsewhere classified   . Kidney stones    sees Dr. Junious Silk  . Personal history of colonic polyps   . Personal history of urinary calculi   . Personal history of venous thrombosis and embolism     Assessment: 66 year old male in ED with bilateral PE and right leg DVT to start IV heparin. Per patient, only on an Aspirin 81 mg at home. No bleeding noted. SCr last available 1.36 with estimated CrCl ~ 59 ml/min. BMET inprocess.   H/H is within normal limits today.  Platelets at 236, within normal limits.    Goal of Therapy:  Heparin level 0.3-0.7 units/ml Monitor platelets by anticoagulation protocol: Yes   Plan:  Heparin bolus 5500 units  x1 Then initiate Heparin drip at 1550 units/hr. Heparin level in 6 hours.  Daily heparin level and CBC.   Sloan Leiter, PharmD, BCPS Clinical Pharmacist Clinical Phone 07/19/2016 until [phone]  After hours, please call [phone] 07/19/2016,4:08 PM

## 2016-07-19 NOTE — ED Notes (Signed)
Admitting at bedside 

## 2016-07-19 NOTE — H&P (Signed)
History and Physical    [name] OIZ:[id number] DOB: 06-18-1950 DOA: 07/19/2016   PCP: Laurey Morale, MD   Patient coming from:  Home    Chief Complaint: Shortness of breath and chest pain    HPI: Steven [name] is a 66 y.o. 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 He denies any cough. No hemoptysis. The patient presented to the emergency department for further evaluation, as he recognized the symptoms to be similar to those of his prior PE. The chest pain is not  worse with deep inspiration, non reproducible, . Patient denies any fever, chills or night sweats. Reports right  lower extremity tense swelling, without discrete  calf pain denies erythema. Denies pre-syncopal episodes, palpitations  Denies any bleeding issues such as epistaxis, hematemesis, hematuria or hematochezia. Denies history of cancer. Denies tobacco use. Patient denies taking hormone replacement therapy. Positive  history or family history of stroke in his father. Active lifestyle.   Prior  hematological evaluation with Dr. Marin Olp in 2009 yielded no obvious mutations. Reports a  recent long distance trips for 5 h by car but he took several stops. No recent surgeries.   . ED Course:  BP 139/79   Resp 15    [name] lower extremity pending   CT A confirms segmental PE in the RLL, no evidence of R heart strain  EKG  SR  D dimer 0.22  Tn 0   CBC and CMET unremarkable   Review of Systems:  As per HPI otherwise all other systems reviewed and are negative  Past Medical History:  Diagnosis Date  . Clotting disorder ([place])   . Displacement of intervertebral disc, site unspecified, without myelopathy   . Esophageal reflux   . Headache(784.0)    sinus and migraines  . Herpes simplex without mention of complication   . Hiatal hernia   . HTN (hypertension)  05/10/2012  . Hyperlipemia   . Hypertrophy (benign) of prostate   . Intestinal infection due to other organism, not elsewhere classified   . Kidney stones    sees Dr. Junious Silk  . Personal history of colonic polyps   . Personal history of urinary calculi   . Personal history of venous thrombosis and embolism     Past Surgical History:  Procedure Laterality Date  . APPENDECTOMY    . BOTOX INJECTION N/A 04/13/2012   Procedure: BOTOX INJECTION;  Surgeon: Milus Banister, MD;  Location: [facility];  Service: Endoscopy;  Laterality: N/A;  . [place]  . CHOLECYSTECTOMY    . COLONOSCOPY  07/03/2015   per Dr. Ardis Hughs, single polyp removed but not retrieved,  repeat in 5 yrs   . ESOPHAGEAL MANOMETRY N/A 02/26/2013   Procedure: ESOPHAGEAL MANOMETRY (EM);  Surgeon: Milus Banister, MD;  Location: [facility];  Service: Endoscopy;  Laterality: N/A;  . ESOPHAGOGASTRODUODENOSCOPY  02-24-10   per Dr. Ardis Hughs, small hiatal hernia only   . ESOPHAGOGASTRODUODENOSCOPY (EGD) WITH ESOPHAGEAL DILATION N/A 04/13/2012   Procedure: ESOPHAGOGASTRODUODENOSCOPY (EGD) WITH ESOPHAGEAL DILATION;  Surgeon: Milus Banister, MD;  Location: [facility];  Service: Endoscopy;  Laterality: N/A;  possible botox injection  . FINGER SURGERY     3rd finger trigger bilateral   . GALLBLADDER SURGERY    . LITHOTRIPSY    . ROTATOR CUFF REPAIR  Right 01-10-12   per Dr. Esmond Plants, also repair torn biceps tendon   . STERIOD INJECTION Left 07/27/2013   Procedure: STEROID INJECTION;  Surgeon: Augustin Schooling, MD;  Location: [place];  Service: Orthopedics;  Laterality: Left;  . TONSILLECTOMY    . TRIGGER FINGER RELEASE Right 07/27/2013   Procedure: RIGHT RING FINGER A-1 RELEASE;  Surgeon: Augustin Schooling, MD;  Location: [place];  Service: Orthopedics;  Laterality: Right;  . URETERAL STENT PLACEMENT  July 2012   left ureter, per Dr. Junious Silk, for a stone     Social History Social History   Social History    . Marital status: Married    Spouse name: N/A  . Number of children: N/A  . Years of education: N/A   Occupational History  . Retired    Social History Main Topics  . Smoking status: Former Smoker    Packs/day: 1.50    Years: 20.00    Types: Cigarettes    Quit date: 03/03/1973  . Smokeless tobacco: Never Used  . Alcohol use No  . Drug use: No  . Sexual activity: Not on file   Other Topics Concern  . Not on file   Social History Narrative  . No narrative on file     Allergies  Allergen Reactions  . Amitriptyline Hcl Other (See Comments)    Mood changes  . Amoxicillin-Pot Clavulanate     REACTION: nausea  . Cyclobenzaprine Hcl     unknown  . Levofloxacin Swelling  . Lisinopril-Hydrochlorothiazide     depression  . Morphine     headache  . Prednisone Other (See Comments)    Blurred vision    Family History  Problem Relation Age of Onset  . Colon cancer Father   . Heart attack Father   . Heart disease Brother   . Heart disease Brother       Prior to Admission medications   Medication Sig Start Date End Date Taking? Authorizing Provider  acyclovir cream (ZOVIRAX) 5 % Apply 1 application topically daily as needed (fever blisters). 09/06/13   Laurey Morale, MD  aspirin EC 81 MG tablet Take 81 mg by mouth daily.    [provider]  clobetasol cream (TEMOVATE) 4.19 % Apply 1 application topically 2 (two) times daily. 03/12/16   [provider]  Diphenhyd-Hydrocort-Nystatin (FIRST-DUKES MOUTHWASH) SUSP Use as directed 5 mLs in the mouth or throat every 6 (six) hours as needed (ulcers). Swish and spit Patient not taking: Reported on 07/19/2016 11/04/15   Laurey Morale, MD  eletriptan (RELPAX) 20 MG tablet Take 1 tablet (20 mg total) by mouth as needed for migraine. Patient not taking: Reported on 07/19/2016 06/24/14   Laurey Morale, MD  ezetimibe (ZETIA) 10 MG tablet Take 1 tablet by mouth daily 06/16/16   Laurey Morale, MD  fenofibrate (TRICOR) 145 MG  tablet Take 1 tablet (145 mg total) by mouth every evening. 09/16/15   Laurey Morale, MD  folic acid (FOLVITE) 1 MG tablet Take 1 mg by mouth daily.    [provider]  gabapentin (NEURONTIN) 300 MG capsule TAKE 1 CAPSULE (300 MG TOTAL) BY MOUTH 3 (THREE) TIMES DAILY. 09/04/15   Laurey Morale, MD  losartan (COZAAR) 50 MG tablet Take 1 tablet by mouth daily 02/19/16   Laurey Morale, MD  RABEprazole (ACIPHEX) 20 MG tablet Take 1 tablet (20 mg total) by mouth 2 (two) times daily. 07/17/15   Alysia Penna  A, MD  temazepam (RESTORIL) 30 MG capsule Take 1 capsule (30 mg total) by mouth at bedtime as needed for sleep. 03/17/15   Laurey Morale, MD    Physical Exam:  Vitals:   07/19/16 1623  BP: 139/79  Resp: 15   Constitutional: NAD, calm, comfortable  Eyes: PERRL, lids and conjunctivae normal ENMT: Mucous membranes are moist, without exudate or lesions  Neck: normal, supple, no masses, no thyromegaly Respiratory: clear to auscultation bilaterally, no wheezing, no crackles. Normal respiratory effort  Cardiovascular: Regular rate and rhythm, soft  murmurs, rubs or gallops. No extremity edema. R> L in diameter, about 1 cm . 2+ pedal pulses. No carotid bruits.  Abdomen: Soft, non tender, No hepatosplenomegaly. Bowel sounds positive.  Musculoskeletal: no clubbing / cyanosis. Moves all extremities Skin: no jaundice, No lesions.  Neurologic: Sensation intact  Strength equal in all extremities, No [facility] Psychiatric:   Alert and oriented x 3. Normal mood.     Labs on Admission: I have personally reviewed following labs and imaging studies  CBC:  Recent Labs Lab 07/19/16 1603  WBC 5.9  NEUTROABS 3.3  HGB 15.4  HCT 44.5  MCV 85.4  PLT 409    Basic Metabolic Panel:  Recent Labs Lab 07/19/16 1137 07/19/16 1603  NA 137 135  K 4.5 3.9  CL 102 103  CO2 28 25  GLUCOSE 97 81  BUN 18 15  CREATININE 1.36 1.28*  CALCIUM 9.6 9.4    GFR: Estimated Creatinine Clearance (by C-G  formula based on SCr of 1.28 mg/dL (H)) Male: 52 mL/min (A) Male: 63.3 mL/min (A)  Liver Function Tests: No results for input(s): AST, ALT, ALKPHOS, BILITOT, PROT, ALBUMIN in the last 168 hours. No results for input(s): LIPASE, AMYLASE in the last 168 hours. No results for input(s): AMMONIA in the last 168 hours.  Coagulation Profile: No results for input(s): INR, PROTIME in the last 168 hours.  Cardiac Enzymes: No results for input(s): CKTOTAL, CKMB, CKMBINDEX, TROPONINI in the last 168 hours.  BNP (last 3 results) No results for input(s): PROBNP in the last 8760 hours.  HbA1C: No results for input(s): HGBA1C in the last 72 hours.  CBG: No results for input(s): GLUCAP in the last 168 hours.  Lipid Profile: No results for input(s): CHOL, HDL, LDLCALC, TRIG, CHOLHDL, LDLDIRECT in the last 72 hours.  Thyroid Function Tests: No results for input(s): TSH, T4TOTAL, FREET4, T3FREE, THYROIDAB in the last 72 hours.  Anemia Panel: No results for input(s): VITAMINB12, FOLATE, FERRITIN, TIBC, IRON, RETICCTPCT in the last 72 hours.  Urine analysis:    Component Value Date/Time   COLORURINE yellow 07/28/2009 0905   APPEARANCEUR Clear 07/28/2009 0905   LABSPEC 1.025 07/28/2009 0905   PHURINE 5.5 07/28/2009 0905   GLUCOSEU NEGATIVE 10/29/2008 2144   HGBUR negative 07/28/2009 0905   BILIRUBINUR n 10/28/2015 [place] 10/29/2008 2144   PROTEINUR n 10/28/2015 1038   PROTEINUR NEGATIVE 10/29/2008 2144   UROBILINOGEN 0.2 10/28/2015 1038   UROBILINOGEN 0.2 07/28/2009 0905   NITRITE n 10/28/2015 1038   NITRITE negative 07/28/2009 0905   LEUKOCYTESUR Negative 10/28/2015 1038    Sepsis Labs: @LABRCNTIP (procalcitonin:4,lacticidven:4) )No results found for this or any previous visit (from the past 240 hour(s)).   Radiological Exams on Admission: Ct Angio Chest W/cm &/or Wo Cm  Result Date: 07/19/2016 CLINICAL DATA:  Right leg DVT. Shortness of breath. Left posterior  chest pain. EXAM: CT ANGIOGRAPHY CHEST WITH CONTRAST TECHNIQUE: Multidetector CT imaging  of the chest was performed using the standard protocol during bolus administration of intravenous contrast. Multiplanar CT image reconstructions and MIPs were obtained to evaluate the vascular anatomy. CONTRAST:  80 cc Isovue 370 IV COMPARISON:  Chest CT 08/13/2011 FINDINGS: Cardiovascular: Filling defects are noted in the right lower lobe pulmonary arterial branches compatible with pulmonary emboli. No visible left pulmonary emboli. No evidence of right heart strain. Heart is borderline in size. Aorta is normal caliber. Mediastinum/Nodes: No mediastinal, hilar, or axillary adenopathy. Lungs/Pleura: Lungs are clear. No focal airspace opacities or suspicious nodules. No effusions. Upper Abdomen: Imaging into the upper abdomen shows no acute findings. Musculoskeletal: Chest wall soft tissues are unremarkable. No acute bony abnormality. Review of the MIP images confirms the above findings. IMPRESSION: Segmental pulmonary emboli in the posterior segmental branches of the right lower lobe. No evidence of right heart strain. Electronically Signed   By: Rolm Baptise M.D.   On: 07/19/2016 15:11    EKG: Independently reviewed.  Assessment/Plan Active Problems:   Hyperlipidemia   Hereditary and idiopathic peripheral neuropathy   Acute thromboembolism of deep veins of lower extremity (HCC)   GERD   Migraine headache   Personal history of venous thrombosis and embolism   Chest pain     Acute recurrent Pulmonary embolus in a patient with a prior history of PE and DVT with 18 months of Coumadin , unprovoked.  Prior workup in 2009 was negative for hypercoagulability He was on ASA daily. Had some SOB and R sided CP on admission, now improved.  CT A confirms segmental PE in the RLL, no evidence of R heart strain  EKG  SR  D dimer 0.22  Tn 0   VS S Afebrile PESI 97 Tele Obs  Heparin per Pharmacy will transition to oral  anticoagulants prior to d/c, will need to be in them indefinitely   2Decho Will await for Doppler LE Patient will require OP follow up with Hematology once discharged    Hypertension BP 139/79   Resp 15  Controlled Continue home anti-hypertensive medications    Hyperlipidemia Continue home statins  GERD, no acute symptoms Continue PPI  Peripheral neuropathy Continue Neurontin   DVT prophylaxis:  Heparin   Code Status:   Full      Family Communication:  Discussed with patient Disposition Plan: Expect patient to be discharged to home after condition improves Consults called:    None  Admission status:Tele  Obs     Sameena Artus E, PA-C [facility]   07/19/2016, 4:50 PM

## 2016-07-19 NOTE — Progress Notes (Signed)
   Subjective:    Patient ID: Steven [name], male    DOB: 07/15/1950, 66 y.o.   MRN: [id number]  HPI Here for 3 days of pain in the right leg from the mid thigh to the calf with some swelling in the calf. He had a DVT in te left leg 10 years ago and this feels just like that did to him. Also he has had some chest pains and SOB in the past few days.    Review of Systems  Constitutional: Negative.   Respiratory: Positive for shortness of breath. Negative for cough, chest tightness and wheezing.   Cardiovascular: Positive for chest pain and leg swelling. Negative for palpitations.  Musculoskeletal: Positive for myalgias.  Neurological: Negative.        Objective:   Physical Exam  Constitutional: He appears well-developed and well-nourished. No distress.  Cardiovascular: Normal rate, regular rhythm, normal heart sounds and intact distal pulses.   Pulmonary/Chest: Effort normal and breath sounds normal. No respiratory distress. He has no wheezes. He has no rales.  Musculoskeletal: He exhibits no edema.  Tender in the right popliteal space and the right calf. No cords felt. Bevelyn Buckles is positive.           Assessment & Plan:  Right leg pain with chest pain and SOB. We are both concerned he could have a DVT and PE again. We will set up a stat chest CT angiogram and venous doppler of the leg.  Alysia Penna, MD

## 2016-07-19 NOTE — ED Provider Notes (Signed)
[facility] DEPT Provider Note   CSN: [id number] Arrival date & time: 07/19/16  1539     History   Chief Complaint Chief Complaint  Patient presents with  . Chest Pain    + PE, +DVT right leg    HPI Steven [name] is a 66 y.o. male.  The history is provided by the patient.  He has a history of prior DVT and pulmonary embolism. Starting 3 days ago, he had pain in his right knee which radiated down into his calf, and has had intermittent chest pain and dyspnea. Chest pain has not been pleuritic. There has been no nausea or vomiting. He has had diaphoresis. There is no cough. Of note, he did have a car trip to [facility] 2 weeks ago. With her prior DVT, he was on Coumadin for 18 months, and no cause for DVT was found. Today, he saw his PCP sent him in for venous Doppler and CT angiogram. Doppler was positive for DVT, and CT angiogram is positive for pulmonary embolism.  Past Medical History:  Diagnosis Date  . Clotting disorder ([place])   . Displacement of intervertebral disc, site unspecified, without myelopathy   . Esophageal reflux   . Headache(784.0)    sinus and migraines  . Herpes simplex without mention of complication   . Hiatal hernia   . HTN (hypertension) 05/10/2012  . Hyperlipemia   . Hypertrophy (benign) of prostate   . Intestinal infection due to other organism, not elsewhere classified   . Kidney stones    sees Dr. Junious Silk  . Personal history of colonic polyps   . Personal history of urinary calculi   . Personal history of venous thrombosis and embolism     Patient Active Problem List   Diagnosis Date Noted  . Chest pain 02/07/2013  . HTN (hypertension) 05/10/2012  . PERSONAL HX COLONIC POLYPS 01/30/2010  . PULMONARY NODULE 01/27/2010  . SHORTNESS OF BREATH 01/14/2010  . Chest pain, unspecified 01/14/2010  . BILIARY COLIC 40/81/4481  . PERIPHERAL NEUROPATHY 07/31/2008  . ACUTE SINUSITIS, UNSPECIFIED 01/10/2008  . FATIGUE 10/11/2007  . KNEE PAIN  06/21/2007  . Personal history of venous thrombosis and embolism 03/06/2007  . DEEP VENOUS THROMBOPHLEBITIS, LEG 03/01/2007  . Pain in limb 03/01/2007  . OTHER ACUTE SINUSITIS 01/18/2007  . HERNIATED DISC 10/31/2006  . FEVER BLISTER 10/24/2006  . Hyperlipidemia 10/24/2006  . GERD 10/24/2006  . NECK PAIN 10/24/2006  . Migraine headache 10/24/2006  . HIATAL HERNIA, HX OF 10/24/2006  . Personal history of urinary calculi 10/24/2006  . BENIGN PROSTATIC HYPERTROPHY, HX OF 10/24/2006    Past Surgical History:  Procedure Laterality Date  . APPENDECTOMY    . BOTOX INJECTION N/A 04/13/2012   Procedure: BOTOX INJECTION;  Surgeon: Milus Banister, MD;  Location: [facility];  Service: Endoscopy;  Laterality: N/A;  . [place]  . CHOLECYSTECTOMY    . COLONOSCOPY  07/03/2015   per Dr. Ardis Hughs, single polyp removed but not retrieved,  repeat in 5 yrs   . ESOPHAGEAL MANOMETRY N/A 02/26/2013   Procedure: ESOPHAGEAL MANOMETRY (EM);  Surgeon: Milus Banister, MD;  Location: [facility];  Service: Endoscopy;  Laterality: N/A;  . ESOPHAGOGASTRODUODENOSCOPY  02-24-10   per Dr. Ardis Hughs, small hiatal hernia only   . ESOPHAGOGASTRODUODENOSCOPY (EGD) WITH ESOPHAGEAL DILATION N/A 04/13/2012   Procedure: ESOPHAGOGASTRODUODENOSCOPY (EGD) WITH ESOPHAGEAL DILATION;  Surgeon: Milus Banister, MD;  Location: [facility];  Service: Endoscopy;  Laterality: N/A;  possible botox injection  . FINGER SURGERY     3rd finger trigger bilateral   . GALLBLADDER SURGERY    . LITHOTRIPSY    . ROTATOR CUFF REPAIR Right 01-10-12   per Dr. Esmond Plants, also repair torn biceps tendon   . STERIOD INJECTION Left 07/27/2013   Procedure: STEROID INJECTION;  Surgeon: Augustin Schooling, MD;  Location: [facility];  Service: Orthopedics;  Laterality: Left;  . TONSILLECTOMY    . TRIGGER FINGER RELEASE Right 07/27/2013   Procedure: RIGHT RING FINGER A-1 RELEASE;  Surgeon: Augustin Schooling, MD;  Location: [facility];   Service: Orthopedics;  Laterality: Right;  . URETERAL STENT PLACEMENT  July 2012   left ureter, per Dr. Junious Silk, for a stone     OB History    No data available       Home Medications    Prior to Admission medications   Medication Sig Start Date End Date Taking? Authorizing Provider  acyclovir cream (ZOVIRAX) 5 % Apply 1 application topically daily as needed (fever blisters). 09/06/13   Laurey Morale, MD  aspirin EC 81 MG tablet Take 81 mg by mouth daily.    [provider]  clobetasol cream (TEMOVATE) 5.00 % Apply 1 application topically 2 (two) times daily. 03/12/16   [provider]  Diphenhyd-Hydrocort-Nystatin (FIRST-DUKES MOUTHWASH) SUSP Use as directed 5 mLs in the mouth or throat every 6 (six) hours as needed (ulcers). Swish and spit Patient not taking: Reported on 07/19/2016 11/04/15   Laurey Morale, MD  eletriptan (RELPAX) 20 MG tablet Take 1 tablet (20 mg total) by mouth as needed for migraine. Patient not taking: Reported on 07/19/2016 06/24/14   Laurey Morale, MD  ezetimibe (ZETIA) 10 MG tablet Take 1 tablet by mouth daily 06/16/16   Laurey Morale, MD  fenofibrate (TRICOR) 145 MG tablet Take 1 tablet (145 mg total) by mouth every evening. 09/16/15   Laurey Morale, MD  folic acid (FOLVITE) 1 MG tablet Take 1 mg by mouth daily.    [provider]  gabapentin (NEURONTIN) 300 MG capsule TAKE 1 CAPSULE (300 MG TOTAL) BY MOUTH 3 (THREE) TIMES DAILY. 09/04/15   Laurey Morale, MD  losartan (COZAAR) 50 MG tablet Take 1 tablet by mouth daily 02/19/16   Laurey Morale, MD  RABEprazole (ACIPHEX) 20 MG tablet Take 1 tablet (20 mg total) by mouth 2 (two) times daily. 07/17/15   Laurey Morale, MD  temazepam (RESTORIL) 30 MG capsule Take 1 capsule (30 mg total) by mouth at bedtime as needed for sleep. 03/17/15   Laurey Morale, MD    Family History Family History  Problem Relation Age of Onset  . Colon cancer Father   . Heart attack Father   . Heart disease Brother     . Heart disease Brother     Social History Social History  Substance Use Topics  . Smoking status: Former Smoker    Packs/day: 1.50    Years: 20.00    Types: Cigarettes    Quit date: 03/03/1973  . Smokeless tobacco: Never Used  . Alcohol use No     Allergies   Amitriptyline hcl; Amoxicillin-pot clavulanate; Cyclobenzaprine hcl; Levofloxacin; Lisinopril-hydrochlorothiazide; Morphine; and Prednisone   Review of Systems Review of Systems  All other systems reviewed and are negative.    Physical Exam Updated Vital Signs BP 131/78 (BP Location: Right Arm)   Pulse 74   Temp 98.3 F (36.8 C) (Oral)  Resp 16   Ht 5\' 10"  (1.778 m)   Wt 88.4 kg (194 lb 14.4 oz)   SpO2 99%   BMI 27.97 kg/m   Physical Exam  Nursing note and vitals reviewed.  66 year old male, resting comfortably and in no acute distress. Vital signs are normal. Oxygen saturation is 99%, which is normal. Head is normocephalic and atraumatic. PERRLA, EOMI. Oropharynx is clear. Neck is nontender and supple without adenopathy or JVD. Back is nontender and there is no CVA tenderness. Lungs are clear without rales, wheezes, or rhonchi. Chest is nontender. Heart has regular rate and rhythm without murmur. Abdomen is soft, flat, nontender without masses or hepatosplenomegaly and peristalsis is normoactive. Extremities: There is tenderness to palpation over the right inguinal vessels. Right thigh circumference is 2 cm greater than left thigh circumference. Right calf circumference is about 1/2 cm greater than left calf circumference. There is tenderness palpation in the left calf without any palpable cord. Homans sign is negative. Skin is warm and dry without rash. Neurologic: Mental status is normal, cranial nerves are intact, there are no motor or sensory deficits.  ED Treatments / Results  Labs (all labs ordered are listed, but only abnormal results are displayed) Labs Reviewed  BASIC METABOLIC PANEL -  Abnormal; Notable for the following:       Result Value   Creatinine, Ser 1.28 (*)    GFR calc non Af Amer 56 (*)    All other components within normal limits  HEPARIN LEVEL (UNFRACTIONATED) - Abnormal; Notable for the following:    Heparin Unfractionated 0.73 (*)    All other components within normal limits  COMPREHENSIVE METABOLIC PANEL - Abnormal; Notable for the following:    Creatinine, Ser 1.43 (*)    GFR calc non Af Amer 49 (*)    GFR calc Af Amer 57 (*)    All other components within normal limits  CBC  DIFFERENTIAL  HEPARIN LEVEL (UNFRACTIONATED)  CBC  TROPONIN I  TROPONIN I  TROPONIN I  I-STAT TROPOININ, ED    EKG  EKG Interpretation  Date/Time:  Monday July 19 2016 15:44:56 EDT Ventricular Rate:  66 PR Interval:  198 QRS Duration: 98 QT Interval:  414 QTC Calculation: 434 R Axis:   -5 Text Interpretation:  Normal sinus rhythm Normal ECG When compared with ECG of 02/08/2013, No significant change was found Confirmed by Delora Fuel ([id number]) on 07/19/2016 3:57:37 PM Also confirmed by Delora Fuel ([id number]), editor Laurena Spies [phone])  on 07/19/2016 4:00:18 PM       Radiology Ct Angio Chest W/cm &/or Wo Cm  Result Date: 07/19/2016 CLINICAL DATA:  Right leg DVT. Shortness of breath. Left posterior chest pain. EXAM: CT ANGIOGRAPHY CHEST WITH CONTRAST TECHNIQUE: Multidetector CT imaging of the chest was performed using the standard protocol during bolus administration of intravenous contrast. Multiplanar CT image reconstructions and MIPs were obtained to evaluate the vascular anatomy. CONTRAST:  80 cc Isovue 370 IV COMPARISON:  Chest CT 08/13/2011 FINDINGS: Cardiovascular: Filling defects are noted in the right lower lobe pulmonary arterial branches compatible with pulmonary emboli. No visible left pulmonary emboli. No evidence of right heart strain. Heart is borderline in size. Aorta is normal caliber. Mediastinum/Nodes: No mediastinal, hilar, or axillary adenopathy.  Lungs/Pleura: Lungs are clear. No focal airspace opacities or suspicious nodules. No effusions. Upper Abdomen: Imaging into the upper abdomen shows no acute findings. Musculoskeletal: Chest wall soft tissues are unremarkable. No acute bony abnormality. Review of the MIP images  confirms the above findings. IMPRESSION: Segmental pulmonary emboli in the posterior segmental branches of the right lower lobe. No evidence of right heart strain. Electronically Signed   By: Rolm Baptise M.D.   On: 07/19/2016 15:11    Procedures Procedures (including critical care time)  Medications Ordered in ED Medications  heparin ADULT infusion 100 units/mL (25000 units/251mL sodium chloride 0.45%) (1,550 Units/hr Intravenous New Bag/Given 07/19/16 1627)  heparin bolus via infusion 5,500 Units (5,500 Units Intravenous Bolus from Bag 07/19/16 1628)     Initial Impression / Assessment and Plan / ED Course  I have reviewed the triage vital signs and the nursing notes.  Pertinent labs & imaging results that were available during my care of the patient were reviewed by me and considered in my medical decision making (see chart for details).  Recurrent DVT and pulmonary embolism. Records from hospitalization for pulmonary embolism in 2009 are not available. He is started on heparin and will need to be admitted. At this point, consideration will need to be given for lifelong anticoagulation. Case is discussed with the PA working with Dr. Marily Memos, who agrees to admit the patient. Of note, laboratory workup does show mild renal insufficiency which is essentially unchanged from baseline.  Final Clinical Impressions(s) / ED Diagnoses   Final diagnoses:  Other acute pulmonary embolism without acute cor pulmonale (HCC)  DVT, lower extremity, proximal, acute, right [facility])  Renal insufficiency    New Prescriptions New Prescriptions   No medications on file     Delora Fuel, MD 15/72/62 1458

## 2016-07-19 NOTE — Patient Instructions (Signed)
WE NOW OFFER   [facility] FAST TRACK!!!  SAME DAY Appointments for ACUTE CARE  Such as: Sprains, Injuries, cuts, abrasions, rashes, muscle pain, joint pain, back pain Colds, flu, sore throats, headache, allergies, cough, fever  Ear pain, sinus and eye infections Abdominal pain, nausea, vomiting, diarrhea, upset stomach Animal/insect bites  3 Easy Ways to Schedule: Walk-In Scheduling Call in scheduling Mychart Sign-up: [email or web address]         

## 2016-07-19 NOTE — Progress Notes (Addendum)
ANTICOAGULATION CONSULT NOTE - Follow Up Consult  Pharmacy Consult for Heparin Indication: pulmonary embolus  Allergies  Allergen Reactions  . Morphine Other (See Comments)    headache  . Amitriptyline Hcl Other (See Comments)    Mood changes  . Amoxicillin-Pot Clavulanate     REACTION: nausea  . Cyclobenzaprine Hcl     unknown  . Levofloxacin Swelling  . Lisinopril-Hydrochlorothiazide     depression  . Prednisone Other (See Comments)    Blurred vision    Patient Measurements: Height: 5\' 10"  (177.8 cm) Weight: 194 lb 14.4 oz (88.4 kg) IBW/kg (Calculated) : 73  Vital Signs: Temp: 98.4 F (36.9 C) (07/09 2138) Temp Source: Oral (07/09 2138) BP: 132/74 (07/09 2138) Pulse Rate: 69 (07/09 2138)  Labs:  Recent Labs  07/19/16 1137 07/19/16 1603 07/19/16 1759 07/19/16 2236  HGB  --  15.4  --   --   HCT  --  44.5  --   --   PLT  --  236  --   --   HEPARINUNFRC  --   --   --  0.66  CREATININE 1.36 1.28*  --   --   TROPONINI  --   --  <0.03  --     Estimated Creatinine Clearance (by C-G formula based on SCr of 1.28 mg/dL (H)) Male: 51.5 mL/min (A) Male: 62.7 mL/min (A)    Assessment: 66 y/o M with acute PE (hx PE in 2009). Initial heparin level is therapeutic.   Goal of Therapy:  Heparin level 0.3-0.7 units/ml Monitor platelets by anticoagulation protocol: Yes   Plan:  -Cont heparin at 1550 units/hr -Confirmatory HL with AM labs  Narda Bonds 07/19/2016,11:34 PM  ======================== Heparin level elevated this AM -Dec heparin to 1450 units/hr -1400 HL Narda Bonds, PharmD, BCPS Clinical Pharmacist Phone: [phone] ========================

## 2016-07-20 ENCOUNTER — Encounter (HOSPITAL_COMMUNITY): Payer: PPO

## 2016-07-20 ENCOUNTER — Observation Stay (HOSPITAL_BASED_OUTPATIENT_CLINIC_OR_DEPARTMENT_OTHER): Payer: PPO

## 2016-07-20 ENCOUNTER — Encounter (HOSPITAL_COMMUNITY): Payer: Self-pay | Admitting: Family Medicine

## 2016-07-20 DIAGNOSIS — I2699 Other pulmonary embolism without acute cor pulmonale: Secondary | ICD-10-CM | POA: Diagnosis not present

## 2016-07-20 DIAGNOSIS — E785 Hyperlipidemia, unspecified: Secondary | ICD-10-CM | POA: Diagnosis not present

## 2016-07-20 DIAGNOSIS — N289 Disorder of kidney and ureter, unspecified: Secondary | ICD-10-CM

## 2016-07-20 DIAGNOSIS — I824Y1 Acute embolism and thrombosis of unspecified deep veins of right proximal lower extremity: Secondary | ICD-10-CM

## 2016-07-20 DIAGNOSIS — K219 Gastro-esophageal reflux disease without esophagitis: Secondary | ICD-10-CM | POA: Diagnosis not present

## 2016-07-20 LAB — ECHOCARDIOGRAM COMPLETE
CHL CUP RV SYS PRESS: 32 mmHg
E decel time: 268 msec
EERAT: 8.12
FS: 35 % (ref 28–44)
HEIGHTINCHES: 70 in
IVS/LV PW RATIO, ED: 0.74
LA diam end sys: 37 mm
LA diam index: 1.76 cm/m2
LA vol A4C: 56 ml
LASIZE: 37 mm
LAVOL: 54.7 mL
LAVOLIN: 26 mL/m2
LV E/e'average: 8.12
LVEEMED: 8.12
LVELAT: 10.4 cm/s
Lateral S' vel: 11.3 cm/s
MV Dec: 268
MV Peak grad: 3 mmHg
MVPKAVEL: 99.4 m/s
MVPKEVEL: 84.4 m/s
PW: 10.1 mm — AB (ref 0.6–1.1)
RV TAPSE: 17.1 mm
Reg peak vel: 271 cm/s
TDI e' lateral: 10.4
TDI e' medial: 6.74
TRMAXVEL: 271 cm/s
WEIGHTICAEL: 3118.4 [oz_av]

## 2016-07-20 LAB — COMPREHENSIVE METABOLIC PANEL
ALT: 26 U/L (ref 17–63)
ANION GAP: 7 (ref 5–15)
AST: 26 U/L (ref 15–41)
Albumin: 3.8 g/dL (ref 3.5–5.0)
Alkaline Phosphatase: 43 U/L (ref 38–126)
BUN: 17 mg/dL (ref 6–20)
CHLORIDE: 105 mmol/L (ref 101–111)
CO2: 26 mmol/L (ref 22–32)
Calcium: 8.9 mg/dL (ref 8.9–10.3)
Creatinine, Ser: 1.43 mg/dL — ABNORMAL HIGH (ref 0.61–1.24)
GFR, EST AFRICAN AMERICAN: 57 mL/min — AB (ref >60)
GFR, EST NON AFRICAN AMERICAN: 49 mL/min — AB (ref >60)
Glucose, Bld: 98 mg/dL (ref 65–99)
POTASSIUM: 4.2 mmol/L (ref 3.5–5.1)
Sodium: 138 mmol/L (ref 135–145)
TOTAL PROTEIN: 6.5 g/dL (ref 6.5–8.1)
Total Bilirubin: 0.9 mg/dL (ref 0.3–1.2)

## 2016-07-20 LAB — HEPARIN LEVEL (UNFRACTIONATED): HEPARIN UNFRACTIONATED: 0.73 [IU]/mL — AB (ref 0.30–0.70)

## 2016-07-20 LAB — CBC
HEMATOCRIT: 42.9 % (ref 39.0–52.0)
HEMOGLOBIN: 14.3 g/dL (ref 13.0–17.0)
MCH: 28.8 pg (ref 26.0–34.0)
MCHC: 33.3 g/dL (ref 30.0–36.0)
MCV: 86.3 fL (ref 78.0–100.0)
Platelets: 230 10*3/uL (ref 150–400)
RBC: 4.97 MIL/uL (ref 4.22–5.81)
RDW: 13.2 % (ref 11.5–15.5)
WBC: 5.6 10*3/uL (ref 4.0–10.5)

## 2016-07-20 LAB — TROPONIN I: Troponin I: <0.03 ng/mL (ref <0.03)

## 2016-07-20 MED ORDER — RIVAROXABAN (XARELTO) EDUCATION KIT FOR DVT/PE PATIENTS
PACK | Freq: Once | Status: DC
  Filled 2016-07-20: qty 1

## 2016-07-20 MED ORDER — RIVAROXABAN (XARELTO) VTE STARTER PACK (15 & 20 MG): ORAL_TABLET | ORAL | 0 refills | Status: DC

## 2016-07-20 MED ORDER — RIVAROXABAN 15 MG PO TABS
15.0000 mg | ORAL_TABLET | Freq: Two times a day (BID) | ORAL | Status: DC
  Administered 2016-07-20: 15 mg via ORAL
  Filled 2016-07-20: qty 1

## 2016-07-20 MED ORDER — RIVAROXABAN 20 MG PO TABS: 20.0000 mg | ORAL_TABLET | Freq: Every day | ORAL | Status: DC

## 2016-07-20 NOTE — Progress Notes (Signed)
  Echocardiogram 2D Echocardiogram has been performed.  Steven [name] 07/20/2016, 1:45 PM

## 2016-07-20 NOTE — Progress Notes (Signed)
Benefits check in process for Xaralto and Eliquis. CM to f/u with results. Whitman Hero RN,BSN,CM

## 2016-07-20 NOTE — Progress Notes (Signed)
ANTICOAGULATION CONSULT NOTE - Follow Up Consult  Pharmacy Consult for Heparin >> Xarelto Indication: bilateral pulmonary embolus and DVT  Allergies  Allergen Reactions  . Morphine Other (See Comments)    headache  . Amitriptyline Hcl Other (See Comments)    Mood changes  . Amoxicillin-Pot Clavulanate     REACTION: nausea  . Cyclobenzaprine Hcl     unknown  . Levofloxacin Swelling  . Lisinopril-Hydrochlorothiazide     depression  . Prednisone Other (See Comments)    Blurred vision    Patient Measurements: Height: 5\' 10"  (177.8 cm) Weight: 194 lb 14.4 oz (88.4 kg) IBW/kg (Calculated) : 73  Vital Signs: Temp: 98.3 F (36.8 C) (07/10 0457) Temp Source: Oral (07/10 0457) BP: 123/70 (07/10 0457) Pulse Rate: 67 (07/10 0457)  Labs:  Recent Labs  07/19/16 1137 07/19/16 1603 07/19/16 1759 07/19/16 2236 07/20/16 0441  HGB  --  15.4  --   --  14.3  HCT  --  44.5  --   --  42.9  PLT  --  236  --   --  230  HEPARINUNFRC  --   --   --  0.66 0.73*  CREATININE 1.36 1.28*  --   --  1.43*  TROPONINI  --   --  <0.03 <0.03 <0.03    Estimated Creatinine Clearance (by C-G formula based on SCr of 1.43 mg/dL (H)) Male: 46.1 mL/min (A) Male: 56.2 mL/min (A)   Medications:  Scheduled:  . ezetimibe  10 mg Oral Daily  . fenofibrate  160 mg Oral Daily  . gabapentin  300 mg Oral TID  . losartan  50 mg Oral Daily  . pantoprazole  40 mg Oral Daily  . rivaroxaban   Does not apply Once  . rivaroxaban  15 mg Oral BID   Followed by  . [START ON 08/10/2016] rivaroxaban  20 mg Oral Q supper   Infusions:    Assessment: 66 yo M with new bilateral PE and DVT.  Pt started on heparin to transition to Xarelto.    Goal of Therapy:  therapeutic anticoagulation Monitor platelets by anticoagulation protocol: Yes   Plan:  Discontinue heparin and associated labs Xarelto 15mg  PO BID x 21 days, then 20mg  daily therafter. Take with food. Xarelto education completed.   [employer], Pharm.D., BCPS Clinical Pharmacist Pager: [phone] Clinical phone for 07/20/2016 from 8:30-4:00 is [phone]. After 4pm, please call [facility] [phone]) for assistance. 07/20/2016 1:04 PM

## 2016-07-20 NOTE — Progress Notes (Signed)
Patient escorted out via wheelchair by nursing student.

## 2016-07-20 NOTE — Discharge Summary (Signed)
Physician Discharge Summary  Steven [name] FIE:[id number] DOB: 01-02-1951 DOA: 07/19/2016  PCP: Laurey Morale, MD  Admit date: 07/19/2016 Discharge date: 07/20/2016  Admitted From: Home Disposition: Home  Recommendations for Outpatient Follow-up:  1. Follow up with PCP in 1 week 2. Please obtain BMP in 3-5 days to recheck creatinine 3. Please follow up on the following pending results: Echocardiogram  Home Health: None Equipment/Devices: None  Discharge Condition: Stable CODE STATUS: Full code Diet recommendation: Heart healthy   Brief/Interim Summary:  Admission HPI written by Waldemar Dickens, MD and Ardelle Lesches, PA-C   Chief Complaint: Shortness of breath and chest pain    HPI: Steven [name] is a 66 y.o. 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 He denies any cough. No hemoptysis. The patient presented to the emergency department for further evaluation, as he recognized the symptoms to be similar to those of his prior PE. The chest pain is not  worse with deep inspiration, non reproducible, . Patient denies any fever, chills or night sweats. Reports right  lower extremity tense swelling, without discrete  calf pain denies erythema. Denies pre-syncopal episodes, palpitations  Denies any bleeding issues such as epistaxis, hematemesis, hematuria or hematochezia. Denies history of cancer. Denies tobacco use. Patient denies taking hormone replacement therapy. Positive  history or family history of stroke in his father. Active lifestyle.   Prior  hematological evaluation with Dr. Marin Olp in 2009 yielded no obvious mutations. Reports a  recent long distance trips for 5 h by car but he took several stops. No recent surgeries.   . 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 CTA significant for right lower lobe PE without evidence of heart strain. Patient has a remote history of PE and DVT and was treated with 18 months of Coumadin. Patient was initially started on heparin drip. He was transitioned to Xarelto prior to discharge. Echocardiogram and is pending on time of discharge. Aspirin discontinued on discharge. Will need follow-up.  Essential hypertension Continue losartan.  Hyperlipidemia Continued Zetia and fenofibrate.  GERD Continued PPI  Peripheral neuropathy Continued Neurontin.  Discharge Diagnoses:  Active Problems:   Hyperlipidemia   Hereditary and idiopathic peripheral neuropathy   Acute thromboembolism of deep veins of lower extremity (HCC)   GERD   Migraine headache   Personal history of venous thrombosis and embolism   Chest pain   Pulmonary emboli [facility])   Renal insufficiency    Discharge Instructions   Allergies as of 07/20/2016      Reactions   Morphine Other (See Comments)   headache   Amitriptyline Hcl Other (See Comments)   Mood changes   Amoxicillin-pot Clavulanate    REACTION: nausea   Cyclobenzaprine Hcl    unknown   Levofloxacin Swelling   Lisinopril-hydrochlorothiazide    depression   Prednisone Other (See Comments)   Blurred vision      Medication List    STOP taking these medications   aspirin EC 81 MG tablet  TAKE these medications   acyclovir cream 5 % Commonly known as:  ZOVIRAX Apply 1 application topically daily as needed (fever blisters).   clobetasol cream 0.05 % Commonly known as:  TEMOVATE Apply 1 application topically daily as needed (herpes).   eletriptan 20 MG tablet Commonly known as:  RELPAX Take 1 tablet (20 mg total) by mouth as needed for migraine.   ezetimibe 10 MG tablet Commonly  known as:  ZETIA Take 1 tablet by mouth daily   fenofibrate 145 MG tablet Commonly known as:  TRICOR Take 1 tablet (145 mg total) by mouth every evening.   FIRST-DUKES MOUTHWASH Susp Use as directed 5 mLs in the mouth or throat every 6 (six) hours as needed (ulcers). Swish and spit   folic acid 1 MG tablet Commonly known as:  FOLVITE Take 1 mg by mouth daily.   losartan 50 MG tablet Commonly known as:  COZAAR Take 1 tablet by mouth daily   RABEprazole 20 MG tablet Commonly known as:  ACIPHEX Take 1 tablet (20 mg total) by mouth 2 (two) times daily.   Rivaroxaban 15 & 20 MG Tbpk Take as directed on package: Start with one 15mg  tablet by mouth twice a day with food. On Day 22, switch to one 20mg  tablet once a day with food.   temazepam 30 MG capsule Commonly known as:  RESTORIL Take 1 capsule (30 mg total) by mouth at bedtime as needed for sleep.      Follow-up Information    Laurey Morale, MD. Schedule an appointment as soon as possible for a visit in 1 week(s).   Specialty:  Family Medicine Contact information: [address] [place] [address] [phone]          Allergies  Allergen Reactions  . Morphine Other (See Comments)    headache  . Amitriptyline Hcl Other (See Comments)    Mood changes  . Amoxicillin-Pot Clavulanate     REACTION: nausea  . Cyclobenzaprine Hcl     unknown  . Levofloxacin Swelling  . Lisinopril-Hydrochlorothiazide     depression  . Prednisone Other (See Comments)    Blurred vision    Consultations:  None   Procedures/Studies: Ct Angio Chest W/cm &/or Wo Cm  Result Date: 07/19/2016 CLINICAL DATA:  Right leg DVT. Shortness of breath. Left posterior chest pain. EXAM: CT ANGIOGRAPHY CHEST WITH CONTRAST TECHNIQUE: Multidetector CT imaging of the chest was performed using the standard protocol during bolus administration of intravenous contrast. Multiplanar CT image reconstructions and MIPs were obtained to evaluate  the vascular anatomy. CONTRAST:  80 cc Isovue 370 IV COMPARISON:  Chest CT 08/13/2011 FINDINGS: Cardiovascular: Filling defects are noted in the right lower lobe pulmonary arterial branches compatible with pulmonary emboli. No visible left pulmonary emboli. No evidence of right heart strain. Heart is borderline in size. Aorta is normal caliber. Mediastinum/Nodes: No mediastinal, hilar, or axillary adenopathy. Lungs/Pleura: Lungs are clear. No focal airspace opacities or suspicious nodules. No effusions. Upper Abdomen: Imaging into the upper abdomen shows no acute findings. Musculoskeletal: Chest wall soft tissues are unremarkable. No acute bony abnormality. Review of the MIP images confirms the above findings. IMPRESSION: Segmental pulmonary emboli in the posterior segmental branches of the right lower lobe. No evidence of right heart strain. Electronically Signed   By: Rolm Baptise M.D.   On: 07/19/2016 15:11     Subjective: Patient reports some right leg pain. He has intermittent right-sided chest pain as well.  Discharge Exam: Vitals:  07/20/16 0457 07/20/16 1353  BP: 123/70 131/78  Pulse: 67 74  Resp: 17 16  Temp: 98.3 F (36.8 C) 98.3 F (36.8 C)   Vitals:   07/19/16 1844 07/19/16 2138 07/20/16 0457 07/20/16 1353  BP: 139/74 132/74 123/70 131/78  Pulse: 63 69 67 74  Resp: 20 18 17 16   Temp: 98.4 F (36.9 C) 98.4 F (36.9 C) 98.3 F (36.8 C) 98.3 F (36.8 C)  TempSrc: Oral Oral Oral Oral  SpO2: 98% 98% 97% 99%  Weight: 88.4 kg (194 lb 14.4 oz)     Height: 5\' 10"  (1.778 m)       General: Pt is alert, awake, not in acute distress Cardiovascular: RRR, S1/S2 +, no rubs, no gallops Respiratory: CTA bilaterally, no wheezing, no rhonchi Abdominal: Soft, NT, ND, bowel sounds + Extremities: no edema, no cyanosis    The results of significant diagnostics from this hospitalization (including imaging, microbiology, ancillary and laboratory) are listed below for reference.      Microbiology: No results found for this or any previous visit (from the past 240 hour(s)).   Labs: BNP (last 3 results) No results for input(s): BNP in the last 8760 hours. Basic Metabolic Panel:  Recent Labs Lab 07/19/16 1137 07/19/16 1603 07/20/16 0441  NA 137 135 138  K 4.5 3.9 4.2  CL 102 103 105  CO2 28 25 26   GLUCOSE 97 81 98  BUN 18 15 17   CREATININE 1.36 1.28* 1.43*  CALCIUM 9.6 9.4 8.9   Liver Function Tests:  Recent Labs Lab 07/20/16 0441  AST 26  ALT 26  ALKPHOS 43  BILITOT 0.9  PROT 6.5  ALBUMIN 3.8   No results for input(s): LIPASE, AMYLASE in the last 168 hours. No results for input(s): AMMONIA in the last 168 hours. CBC:  Recent Labs Lab 07/19/16 1603 07/20/16 0441  WBC 5.9 5.6  NEUTROABS 3.3  --   HGB 15.4 14.3  HCT 44.5 42.9  MCV 85.4 86.3  PLT 236 230   Cardiac Enzymes:  Recent Labs Lab 07/19/16 1759 07/19/16 2236 07/20/16 0441  TROPONINI <0.03 <0.03 <0.03   BNP: Invalid input(s): POCBNP CBG: No results for input(s): GLUCAP in the last 168 hours. D-Dimer No results for input(s): DDIMER in the last 72 hours. Hgb A1c No results for input(s): HGBA1C in the last 72 hours. Lipid Profile No results for input(s): CHOL, HDL, LDLCALC, TRIG, CHOLHDL, LDLDIRECT in the last 72 hours. Thyroid function studies No results for input(s): TSH, T4TOTAL, T3FREE, THYROIDAB in the last 72 hours.  Invalid input(s): FREET3 Anemia work up No results for input(s): VITAMINB12, FOLATE, FERRITIN, TIBC, IRON, RETICCTPCT in the last 72 hours. Urinalysis    Component Value Date/Time   COLORURINE yellow 07/28/2009 0905   APPEARANCEUR Clear 07/28/2009 0905   LABSPEC 1.025 07/28/2009 0905   PHURINE 5.5 07/28/2009 0905   GLUCOSEU NEGATIVE 10/29/2008 2144   HGBUR negative 07/28/2009 0905   BILIRUBINUR n 10/28/2015 [place] 10/29/2008 2144   PROTEINUR n 10/28/2015 1038   PROTEINUR NEGATIVE 10/29/2008 2144   UROBILINOGEN 0.2  10/28/2015 1038   UROBILINOGEN 0.2 07/28/2009 0905   NITRITE n 10/28/2015 1038   NITRITE negative 07/28/2009 0905   LEUKOCYTESUR Negative 10/28/2015 1038    SIGNED:   Cordelia Poche, MD [facility] 07/20/2016, 2:10 PM Pager [phone]  If 7PM-7AM, please contact night-coverage [email or web address] Password [email or web address]

## 2016-07-20 NOTE — Progress Notes (Signed)
Pt discharges per MD order discharge instructions reviewed with pt and wife. prescription & education kit provided. Lars Mage, Student-RN

## 2016-07-20 NOTE — Progress Notes (Signed)
Benefits check for [place] and Eliquis:  Roselind Messier, RN        # 3. S/W LIZ @ @ [employer], # [phone] OPT-3   1. ZARELTO 15 MG BID  COVER- YES  CO-PAY- $ 45.00  TIER- 3 DRUG  PRIOR APPROVAL- NO   2. XARELTO 20 MG DAILY  COVER- YES  CO-PAY- $ 45.00  TIER- 3 DRUG  PRIOR APPROVAL- NO   3. ELIQUIS  2.5 MG BID  COVER- YES  CO-PAY- $ 45.00  TIER- 3 DRUG  PRIOR APPROVAL- NO   4. ELIQUIS  5 MG BID  COVER- YES  CO-PAY- $ 45.00  TIER- 3 DRUG  PRIOR APPROVAL- NO   ELIQUIS 10 MG - NO   PHARMACY : [facility]   Previous Messages    ----- Message -----  From: Cecille Rubin, RN  Sent: 07/20/2016  9:11 AM  To: Chl Ip Ccm Case Mgr Assistant  Subject: benefits check                  Please check copay , prior if needed:  1. Xaralto 15mg  twice a day for 21 days , then 20mg  everyday  2. Eliquis 10mg  twice a day x 10 days , then 5mg  twice a day  Thanks     Whitman Hero RN,BSN,CM

## 2016-07-22 ENCOUNTER — Ambulatory Visit (INDEPENDENT_AMBULATORY_CARE_PROVIDER_SITE_OTHER): Payer: PPO | Admitting: Family Medicine

## 2016-07-22 ENCOUNTER — Encounter: Payer: Self-pay | Admitting: Family Medicine

## 2016-07-22 ENCOUNTER — Telehealth: Payer: Self-pay | Admitting: Family Medicine

## 2016-07-22 VITALS — BP 139/82 | HR 68 | Temp 98.0°F | Ht 70.0 in | Wt 196.0 lb

## 2016-07-22 DIAGNOSIS — I2699 Other pulmonary embolism without acute cor pulmonale: Secondary | ICD-10-CM | POA: Diagnosis not present

## 2016-07-22 DIAGNOSIS — I82401 Acute embolism and thrombosis of unspecified deep veins of right lower extremity: Secondary | ICD-10-CM

## 2016-07-22 DIAGNOSIS — I82409 Acute embolism and thrombosis of unspecified deep veins of unspecified lower extremity: Secondary | ICD-10-CM | POA: Insufficient documentation

## 2016-07-22 MED ORDER — HYDROCODONE-ACETAMINOPHEN 10-325 MG PO TABS: 1.0000 | ORAL_TABLET | Freq: Three times a day (TID) | ORAL | 0 refills | Status: DC | PRN

## 2016-07-22 NOTE — Telephone Encounter (Signed)
Noted  

## 2016-07-22 NOTE — Progress Notes (Signed)
   Subjective:    Patient ID: Steven [name], male    DOB: Jan 24, 1950, 66 y.o.   MRN: [id number]  HPI Here to follow up on a hospital stay from 07-19-16 to 07-20-16 for 3 small DVT's in the right lower leg and PE's in the right lower lung. He presented with some SOB and right leg pain, and the thrombi were seen on an [name] and a CT angiogram. He received IV heparin and was switched to Xarelto. He will take 15 mg bid of Xarelto for 21 days and then will switch to Xarelto 20 mg once daily. He was taken off aspirin. He was told to hold his HCTZ until he saw [name]. Today he feels stronger in general and he has no more SOB, however he has had increased pain in the right leg. He is elevating ths leg and using warm compresses. Tylenol does not help.    Review of Systems  Constitutional: Negative.   Respiratory: Negative.   Cardiovascular: Negative.   Musculoskeletal: Positive for myalgias.  Neurological: Negative.        Objective:   Physical Exam  Constitutional: He is oriented to person, place, and time. He appears well-developed and well-nourished.  Cardiovascular: Normal rate, regular rhythm, normal heart sounds and intact distal pulses.   Pulmonary/Chest: Effort normal and breath sounds normal. No respiratory distress. He has no wheezes. He has no rales.  Neurological: He is alert and oriented to person, place, and time.          Assessment & Plan:  He has recurrent DVT and PE. He is stable now but has phlebitis pain in the leg. trat with Vicodin as needed. He will swithch the Xarelto to 20 mg daily in several weeks, and because his emboli are recurrent (with a previous hypercoagulability workup that was unrevealing), we agreed that he should remain on anticoagulation for the rest of his life. Recheck next week. He will rest and avoid overexertion.  Alysia Penna, MD

## 2016-07-22 NOTE — Patient Instructions (Signed)
WE NOW OFFER   [name] FAST TRACK!!!  SAME DAY Appointments for ACUTE CARE  Such as: Sprains, Injuries, cuts, abrasions, rashes, muscle pain, joint pain, back pain Colds, flu, sore throats, headache, allergies, cough, fever  Ear pain, sinus and eye infections Abdominal pain, nausea, vomiting, diarrhea, upset stomach Animal/insect bites  3 Easy Ways to Schedule: Walk-In Scheduling Call in scheduling Mychart Sign-up: [email or web address]         

## 2016-07-22 NOTE — Telephone Encounter (Signed)
Patient Name: Steven [name]  DOB: [birth year]    Initial Comment husband has pain in legs, #4 right now, just released from hospital, has blood clots in legs   Nurse Assessment  Nurse: Raphael Gibney, RN, Vanita Ingles Date/Time (Eastern Time): 07/22/2016 8:34:04 AM  Confirm and document reason for call. If symptomatic, describe symptoms. ---Caller states spouse was released from hospital on Tuesday and had blood clots in his right lung and in his right leg Has pain in his right leg to his thigh. No redness or swelling. He is taking xarelto. Pain level 4 and has been high as a 7 . Pain is worse than it was in the hospital.  Does the patient have any new or worsening symptoms? ---Yes  Will a triage be completed? ---Yes  Related visit to physician within the last 2 weeks? ---Yes  Does the PT have any chronic conditions? (i.e. diabetes, asthma, etc.) ---Yes  List chronic conditions. ---DVT right leg and pulmonary embolus  Is this a behavioral health or substance abuse call? ---No     Guidelines    Guideline Title Affirmed Question Affirmed Notes  Leg Pain History of prior "blood clot" in leg or lungs (i.e., deep vein thrombosis, pulmonary embolism)    Final Disposition User   See Physician within 4 Hours (or PCP triage) Raphael Gibney, RN, Vera    Comments  appt scheduled for 07/22/2016 at 11 am with Dr. Alysia Penna   Referrals  REFERRED TO PCP OFFICE   Disagree/Comply: Comply

## 2016-07-27 ENCOUNTER — Ambulatory Visit (INDEPENDENT_AMBULATORY_CARE_PROVIDER_SITE_OTHER): Payer: PPO | Admitting: Family Medicine

## 2016-07-27 ENCOUNTER — Encounter: Payer: Self-pay | Admitting: Family Medicine

## 2016-07-27 VITALS — BP 136/79 | HR 62 | Temp 98.5°F | Ht 70.0 in | Wt 197.0 lb

## 2016-07-27 DIAGNOSIS — I2699 Other pulmonary embolism without acute cor pulmonale: Secondary | ICD-10-CM | POA: Diagnosis not present

## 2016-07-27 DIAGNOSIS — I82401 Acute embolism and thrombosis of unspecified deep veins of right lower extremity: Secondary | ICD-10-CM

## 2016-07-27 NOTE — Progress Notes (Signed)
   Subjective:    Patient ID: Steven [name], male    DOB: 1950-12-29, 66 y.o.   MRN: [id number]  HPI Here to follow up on a DVT and several PE's. Since we saw him last week he has improved a bit. The swelling in the leg has gone down and he has less pain in the leg. No chest pain or SOB. He is still taking Xarelto 15 mg bid.    Review of Systems  Constitutional: Negative.   Respiratory: Negative.   Cardiovascular: Negative.   Musculoskeletal: Negative.   Neurological: Negative.        Objective:   Physical Exam  Constitutional: He is oriented to person, place, and time. He appears well-developed and well-nourished.  He  is walking more easily   Cardiovascular: Normal rate, regular rhythm, normal heart sounds and intact distal pulses.   Pulmonary/Chest: Effort normal and breath sounds normal. No respiratory distress. He has no wheezes. He has no rales.  Musculoskeletal: He exhibits no edema.  The right calf is mildly tender   Neurological: He is alert and oriented to person, place, and time.          Assessment & Plan:  He is recovering from PEs and a right leg DVT. Stay on current regimen and he will follow up in 2 weeks.  Alysia Penna, MD

## 2016-07-27 NOTE — Patient Instructions (Signed)
WE NOW OFFER   [facility] FAST TRACK!!!  SAME DAY Appointments for ACUTE CARE  Such as: Sprains, Injuries, cuts, abrasions, rashes, muscle pain, joint pain, back pain Colds, flu, sore throats, headache, allergies, cough, fever  Ear pain, sinus and eye infections Abdominal pain, nausea, vomiting, diarrhea, upset stomach Animal/insect bites  3 Easy Ways to Schedule: Walk-In Scheduling Call in scheduling Mychart Sign-up: [email or web address]         

## 2016-08-06 ENCOUNTER — Encounter: Payer: Self-pay | Admitting: Family Medicine

## 2016-08-06 ENCOUNTER — Ambulatory Visit (INDEPENDENT_AMBULATORY_CARE_PROVIDER_SITE_OTHER): Payer: PPO | Admitting: Family Medicine

## 2016-08-06 VITALS — BP 127/78 | HR 78 | Temp 98.6°F | Ht 70.0 in | Wt 201.0 lb

## 2016-08-06 DIAGNOSIS — N289 Disorder of kidney and ureter, unspecified: Secondary | ICD-10-CM | POA: Diagnosis not present

## 2016-08-06 DIAGNOSIS — I82401 Acute embolism and thrombosis of unspecified deep veins of right lower extremity: Secondary | ICD-10-CM | POA: Diagnosis not present

## 2016-08-06 DIAGNOSIS — I2699 Other pulmonary embolism without acute cor pulmonale: Secondary | ICD-10-CM | POA: Diagnosis not present

## 2016-08-06 DIAGNOSIS — R42 Dizziness and giddiness: Secondary | ICD-10-CM

## 2016-08-06 DIAGNOSIS — I10 Essential (primary) hypertension: Secondary | ICD-10-CM | POA: Diagnosis not present

## 2016-08-06 LAB — CBC WITH DIFFERENTIAL/PLATELET
Basophils Absolute: 0.1 10*3/uL (ref 0.0–0.1)
Basophils Relative: 0.9 % (ref 0.0–3.0)
EOS PCT: 3 % (ref 0.0–5.0)
Eosinophils Absolute: 0.2 10*3/uL (ref 0.0–0.7)
HEMATOCRIT: 45.4 % (ref 39.0–52.0)
HEMOGLOBIN: 15.2 g/dL (ref 13.0–17.0)
LYMPHS PCT: 31.4 % (ref 12.0–46.0)
Lymphs Abs: 2 10*3/uL (ref 0.7–4.0)
MCHC: 33.4 g/dL (ref 30.0–36.0)
MCV: 87.5 fl (ref 78.0–100.0)
MONO ABS: 0.5 10*3/uL (ref 0.1–1.0)
MONOS PCT: 8.7 % (ref 3.0–12.0)
Neutro Abs: 3.5 10*3/uL (ref 1.4–7.7)
Neutrophils Relative %: 56 % (ref 43.0–77.0)
Platelets: 292 10*3/uL (ref 150.0–400.0)
RBC: 5.19 Mil/uL (ref 4.22–5.81)
RDW: 13.1 % (ref 11.5–15.5)
WBC: 6.3 10*3/uL (ref 4.0–10.5)

## 2016-08-06 LAB — BASIC METABOLIC PANEL
BUN: 16 mg/dL (ref 6–23)
CHLORIDE: 102 meq/L (ref 96–112)
CO2: 29 mEq/L (ref 19–32)
Calcium: 10.2 mg/dL (ref 8.4–10.5)
Creatinine, Ser: 1.39 mg/dL (ref 0.40–1.50)
GFR: 54.05 mL/min — AB (ref >60.00)
Glucose, Bld: 110 mg/dL — ABNORMAL HIGH (ref 70–99)
POTASSIUM: 4.1 meq/L (ref 3.5–5.1)
Sodium: 140 mEq/L (ref 135–145)

## 2016-08-06 NOTE — Progress Notes (Signed)
   Subjective:    Patient ID: Steven [name], male    DOB: [birth year], 66 y.o.   MRN: [id number]  HPI Here for several issues. First he has had aching pains in the left leg for several days. No swelling. He is being treated for PEs and a right leg DVT currently, and he is taking Xarelto. He had a DVT in the left leg a few years ago. Also in te past few days he has had spells of lightheadedness, and on one occasiona he felt very hot and flushed and had to sit down. No headaches. No chest pain or SOB. He had a normal treadmill Myoview in 2015. He has never had his carotids looked at though.    Review of Systems  Constitutional: Negative.   Respiratory: Negative.   Cardiovascular: Negative.   Gastrointestinal: Negative.   Musculoskeletal: Positive for myalgias.  Neurological: Positive for light-headedness. Negative for dizziness, tremors, seizures, syncope, facial asymmetry, speech difficulty, weakness, numbness and headaches.       Objective:   Physical Exam  Constitutional: He is oriented to person, place, and time. He appears well-developed and well-nourished. No distress.  Neck: Neck supple. No thyromegaly present.  Cardiovascular: Normal rate, regular rhythm, normal heart sounds and intact distal pulses.   No murmur heard. No carotid bruits   Pulmonary/Chest: Effort normal and breath sounds normal. No respiratory distress. He has no wheezes. He has no rales.  Abdominal: Soft. Bowel sounds are normal. He exhibits no distension. There is no tenderness. There is no rebound and no guarding.  Lymphadenopathy:    He has no cervical adenopathy.  Neurological: He is alert and oriented to person, place, and time.          Assessment & Plan:  He is recovering from PEs and a DVT. The left leg probably has a little phlebitis. He can rest and use Tylenol prn. For the recent lightheadedness we will set up carotid dopplers soon and check a CBC and BMET today.  Alysia Penna, MD

## 2016-08-06 NOTE — Patient Instructions (Signed)
WE NOW OFFER   [name] FAST TRACK!!!  SAME DAY Appointments for ACUTE CARE  Such as: Sprains, Injuries, cuts, abrasions, rashes, muscle pain, joint pain, back pain Colds, flu, sore throats, headache, allergies, cough, fever  Ear pain, sinus and eye infections Abdominal pain, nausea, vomiting, diarrhea, upset stomach Animal/insect bites  3 Easy Ways to Schedule: Walk-In Scheduling Call in scheduling Mychart Sign-up: [email or web address]         

## 2016-08-10 ENCOUNTER — Ambulatory Visit (INDEPENDENT_AMBULATORY_CARE_PROVIDER_SITE_OTHER): Payer: PPO | Admitting: Family Medicine

## 2016-08-10 ENCOUNTER — Encounter: Payer: Self-pay | Admitting: Family Medicine

## 2016-08-10 VITALS — BP 117/78 | HR 66 | Temp 98.4°F | Ht 70.0 in | Wt 201.0 lb

## 2016-08-10 DIAGNOSIS — I82401 Acute embolism and thrombosis of unspecified deep veins of right lower extremity: Secondary | ICD-10-CM

## 2016-08-10 DIAGNOSIS — I2699 Other pulmonary embolism without acute cor pulmonale: Secondary | ICD-10-CM

## 2016-08-10 DIAGNOSIS — I10 Essential (primary) hypertension: Secondary | ICD-10-CM

## 2016-08-10 MED ORDER — RIVAROXABAN 20 MG PO TABS: 20.0000 mg | ORAL_TABLET | Freq: Every day | ORAL | 3 refills | Status: DC

## 2016-08-10 NOTE — Patient Instructions (Signed)
WE NOW OFFER   [facility] FAST TRACK!!!  SAME DAY Appointments for ACUTE CARE  Such as: Sprains, Injuries, cuts, abrasions, rashes, muscle pain, joint pain, back pain Colds, flu, sore throats, headache, allergies, cough, fever  Ear pain, sinus and eye infections Abdominal pain, nausea, vomiting, diarrhea, upset stomach Animal/insect bites  3 Easy Ways to Schedule: Walk-In Scheduling Call in scheduling Mychart Sign-up: [email or web address]         

## 2016-08-10 NOTE — Progress Notes (Signed)
   Subjective:    Patient ID: Chyrl Civatte, male    DOB: 07/17/1950, 66 y.o.   MRN: [id number]  HPI Here to follow up on right leg DVT and PEs. We saw him last week with an episode of dizziness and some left leg pain. Both of these have resolved and he feels great. He has carotid dopplers scheduled for this Friday. Today is the first day he starts the new dosage of Xarelto at 20 mg daily.    Review of Systems  Constitutional: Negative.   Respiratory: Negative.   Cardiovascular: Negative.   Musculoskeletal: Negative.   Neurological: Negative.        Objective:   Physical Exam  Constitutional: He is oriented to person, place, and time. He appears well-developed and well-nourished.  Cardiovascular: Normal rate, regular rhythm, normal heart sounds and intact distal pulses.   Pulmonary/Chest: Effort normal and breath sounds normal. No respiratory distress. He has no wheezes. He has no rales.  Musculoskeletal: He exhibits no edema or tenderness.  Neurological: He is alert and oriented to person, place, and time.          Assessment & Plan:  He is doing well on anticoagulation for recurrent DVT and PEs. Assuming his carotids are clear, we will see him back in 3 months. Alysia Penna, MD

## 2016-08-13 ENCOUNTER — Ambulatory Visit (HOSPITAL_COMMUNITY)
Admission: RE | Admit: 2016-08-13 | Discharge: 2016-08-13 | Disposition: A | Discharge status: Home / Self Care | Payer: PPO | Source: Ambulatory Visit | Attending: Cardiovascular Disease | Admitting: Cardiovascular Disease

## 2016-08-13 DIAGNOSIS — R42 Dizziness and giddiness: Secondary | ICD-10-CM

## 2016-08-13 DIAGNOSIS — I6523 Occlusion and stenosis of bilateral carotid arteries: Secondary | ICD-10-CM | POA: Insufficient documentation

## 2016-08-17 ENCOUNTER — Telehealth: Payer: Self-pay | Admitting: Family Medicine

## 2016-08-17 NOTE — Telephone Encounter (Signed)
Pts wife calling to see if the office has any samples for XARELTO until they get thiers in a few days and unable to get any from the local pharmacy due to them getting it mail order.

## 2016-08-17 NOTE — Telephone Encounter (Signed)
I spoke with pt, we do not have any samples. Per Dr. Sarajane Jews [phone] local [facility] and I did.

## 2016-09-07 ENCOUNTER — Telehealth: Payer: Self-pay | Admitting: Family Medicine

## 2016-09-07 MED ORDER — FENOFIBRATE 145 MG PO TABS: 145.0000 mg | ORAL_TABLET | Freq: Every evening | ORAL | 1 refills | Status: DC

## 2016-09-07 NOTE — Telephone Encounter (Signed)
Pt needs new rx tricor 145 mg #90 w/refills [facility]

## 2016-09-07 NOTE — Telephone Encounter (Signed)
I sent script e-scribe to mail order.  

## 2016-11-03 ENCOUNTER — Encounter: Payer: Self-pay | Admitting: Family Medicine

## 2016-11-03 ENCOUNTER — Ambulatory Visit (INDEPENDENT_AMBULATORY_CARE_PROVIDER_SITE_OTHER): Payer: PPO | Admitting: Family Medicine

## 2016-11-03 VITALS — BP 119/74 | HR 59 | Temp 98.1°F | Ht 70.0 in | Wt 197.0 lb

## 2016-11-03 DIAGNOSIS — Z125 Encounter for screening for malignant neoplasm of prostate: Secondary | ICD-10-CM

## 2016-11-03 DIAGNOSIS — Z23 Encounter for immunization: Secondary | ICD-10-CM

## 2016-11-03 DIAGNOSIS — Z0000 Encounter for general adult medical examination without abnormal findings: Secondary | ICD-10-CM | POA: Diagnosis not present

## 2016-11-03 LAB — LIPID PANEL
CHOLESTEROL: 170 mg/dL (ref 0–200)
HDL: 38.4 mg/dL — ABNORMAL LOW (ref >39.00)
LDL Cholesterol: 105 mg/dL — ABNORMAL HIGH (ref 0–99)
NONHDL: 131.42
TRIGLYCERIDES: 133 mg/dL (ref 0.0–149.0)
Total CHOL/HDL Ratio: 4
VLDL: 26.6 mg/dL (ref 0.0–40.0)

## 2016-11-03 LAB — BASIC METABOLIC PANEL
BUN: 25 mg/dL — AB (ref 6–23)
CALCIUM: 9.3 mg/dL (ref 8.4–10.5)
CHLORIDE: 103 meq/L (ref 96–112)
CO2: 28 meq/L (ref 19–32)
CREATININE: 1.38 mg/dL (ref 0.40–1.50)
GFR: 54.46 mL/min — ABNORMAL LOW (ref >60.00)
GLUCOSE: 94 mg/dL (ref 70–99)
Potassium: 4.1 mEq/L (ref 3.5–5.1)
Sodium: 139 mEq/L (ref 135–145)

## 2016-11-03 LAB — TSH: TSH: 2.27 u[IU]/mL (ref 0.35–4.50)

## 2016-11-03 LAB — HEPATIC FUNCTION PANEL
ALBUMIN: 4.4 g/dL (ref 3.5–5.2)
ALT: 23 U/L (ref 0–53)
AST: 23 U/L (ref 0–37)
Alkaline Phosphatase: 53 U/L (ref 39–117)
BILIRUBIN TOTAL: 0.6 mg/dL (ref 0.2–1.2)
Bilirubin, Direct: 0.1 mg/dL (ref 0.0–0.3)
TOTAL PROTEIN: 6.7 g/dL (ref 6.0–8.3)

## 2016-11-03 LAB — PSA: PSA: 0.55 ng/mL (ref 0.10–4.00)

## 2016-11-03 LAB — CBC WITH DIFFERENTIAL/PLATELET
BASOS PCT: 1.1 % (ref 0.0–3.0)
Basophils Absolute: 0.1 10*3/uL (ref 0.0–0.1)
EOS ABS: 0.1 10*3/uL (ref 0.0–0.7)
EOS PCT: 2.4 % (ref 0.0–5.0)
HCT: 44.8 % (ref 39.0–52.0)
HEMOGLOBIN: 15.1 g/dL (ref 13.0–17.0)
LYMPHS ABS: 1.4 10*3/uL (ref 0.7–4.0)
Lymphocytes Relative: 30.2 % (ref 12.0–46.0)
MCHC: 33.8 g/dL (ref 30.0–36.0)
MCV: 87.4 fl (ref 78.0–100.0)
MONO ABS: 0.5 10*3/uL (ref 0.1–1.0)
Monocytes Relative: 10 % (ref 3.0–12.0)
NEUTROS ABS: 2.6 10*3/uL (ref 1.4–7.7)
NEUTROS PCT: 56.3 % (ref 43.0–77.0)
PLATELETS: 245 10*3/uL (ref 150.0–400.0)
RBC: 5.12 Mil/uL (ref 4.22–5.81)
RDW: 13.5 % (ref 11.5–15.5)
WBC: 4.6 10*3/uL (ref 4.0–10.5)

## 2016-11-03 LAB — POC URINALSYSI DIPSTICK (AUTOMATED)
Bilirubin, UA: NEGATIVE
GLUCOSE UA: NEGATIVE
KETONES UA: NEGATIVE
Leukocytes, UA: NEGATIVE
Nitrite, UA: NEGATIVE
Protein, UA: NEGATIVE
RBC UA: NEGATIVE
SPEC GRAV UA: 1.025 (ref 1.010–1.025)
Urobilinogen, UA: 1 E.U./dL
pH, UA: 6 (ref 5.0–8.0)

## 2016-11-03 MED ORDER — RABEPRAZOLE SODIUM 20 MG PO TBEC: 20.0000 mg | DELAYED_RELEASE_TABLET | Freq: Two times a day (BID) | ORAL | 3 refills | Status: DC

## 2016-11-03 NOTE — Progress Notes (Signed)
   Subjective:    Patient ID: Steven [name], male    DOB: 03-24-1950, 66 y.o.   MRN: [id number]  HPI Here for a well exam. He is doing well in general.    Review of Systems  Constitutional: Negative.   HENT: Negative.   Eyes: Negative.   Respiratory: Negative.   Cardiovascular: Negative.   Gastrointestinal: Negative.   Genitourinary: Negative.   Musculoskeletal: Negative.   Skin: Negative.   Neurological: Negative.   Psychiatric/Behavioral: Negative.        Objective:   Physical Exam  Constitutional: He is oriented to person, place, and time. He appears well-developed and well-nourished. No distress.  HENT:  Head: Normocephalic and atraumatic.  Right Ear: External ear normal.  Left Ear: External ear normal.  Nose: Nose normal.  Mouth/Throat: Oropharynx is clear and moist. No oropharyngeal exudate.  Eyes: Pupils are equal, round, and reactive to light. Conjunctivae and EOM are normal. Right eye exhibits no discharge. Left eye exhibits no discharge. No scleral icterus.  Neck: Neck supple. No JVD present. No tracheal deviation present. No thyromegaly present.  Cardiovascular: Normal rate, regular rhythm, normal heart sounds and intact distal pulses.  Exam reveals no gallop and no friction rub.   No murmur heard. Pulmonary/Chest: Effort normal and breath sounds normal. No respiratory distress. He has no wheezes. He has no rales. He exhibits no tenderness.  Abdominal: Soft. Bowel sounds are normal. He exhibits no distension and no mass. There is no tenderness. There is no rebound and no guarding.  Genitourinary: Rectum normal, prostate normal and penis normal. Rectal exam shows guaiac negative stool. No penile tenderness.  Musculoskeletal: Normal range of motion. He exhibits no edema or tenderness.  Lymphadenopathy:    He has no cervical adenopathy.  Neurological: He is alert and oriented to person, place, and time. He has normal reflexes. No cranial nerve deficit. He exhibits  normal muscle tone. Coordination normal.  Skin: Skin is warm and dry. No rash noted. He is not diaphoretic. No erythema. No pallor.  Psychiatric: He has a normal mood and affect. His behavior is normal. Judgment and thought content normal.          Assessment & Plan:  Well exam. We discussed diet and exercise. Get fasting labs. We will plan on a follow up chest CTA in early January ( a 6 month interval) to follow the PE's.  Alysia Penna, MD

## 2016-11-03 NOTE — Patient Instructions (Signed)
WE NOW OFFER    [facility] FAST TRACK!!!  SAME DAY Appointments for ACUTE CARE  Such as: Sprains, Injuries, cuts, abrasions, rashes, muscle pain, joint pain, back pain Colds, flu, sore throats, headache, allergies, cough, fever  Ear pain, sinus and eye infections Abdominal pain, nausea, vomiting, diarrhea, upset stomach Animal/insect bites  3 Easy Ways to Schedule: Walk-In Scheduling Call in scheduling Mychart Sign-up: [email or web address]         

## 2016-11-17 ENCOUNTER — Ambulatory Visit: Payer: PPO | Admitting: Family Medicine

## 2016-11-30 ENCOUNTER — Other Ambulatory Visit: Payer: Self-pay

## 2016-11-30 ENCOUNTER — Telehealth: Payer: Self-pay | Admitting: Family Medicine

## 2016-11-30 NOTE — Telephone Encounter (Signed)
Ok to refill mouth wash for pt? (Diphenhyd-hydrocort Nystatin) Sent to PCP

## 2016-12-01 MED ORDER — FIRST-DUKES MOUTHWASH MT SUSP: 5.0000 mL | Freq: Four times a day (QID) | OROMUCOSAL | 1 refills | Status: DC | PRN

## 2016-12-01 NOTE — Telephone Encounter (Signed)
Called and spoke to the pt she asked to have this refilled for a year supply [facility] [place] sent to PCP for year supply approval.

## 2016-12-01 NOTE — Telephone Encounter (Signed)
Please call in another bottle

## 2016-12-01 NOTE — Telephone Encounter (Signed)
Per Dr. Sarajane Jews okay to refill Dukes mouthwash and we did.

## 2016-12-01 NOTE — Telephone Encounter (Signed)
Okay to order this for a full year

## 2016-12-06 ENCOUNTER — Telehealth: Payer: Self-pay | Admitting: Family Medicine

## 2016-12-06 NOTE — Telephone Encounter (Signed)
Copied from [place]. Topic: Quick Communication - See Telephone Encounter >> Dec 06, 2016  4:33 PM Bea Graff, NT wrote: CRM for notification. See Telephone encounter for: Patients wife called and states [facility] magic mouthwash for this pt on 11/21. Can this be called in again? Please advise  12/06/16.

## 2016-12-06 NOTE — Telephone Encounter (Signed)
Sent to PCP could we try another medication option or ask pt to reach out to his insurance pharmacy to find out what they will cover or start a PA?

## 2016-12-06 NOTE — Telephone Encounter (Signed)
cacel the Magic Mouthwash. Instead call in Nystatin oral suspension 100,000 units /ml. Swish 5 ml in the mouth QID as needed, 300 ml with 5 rf

## 2016-12-06 NOTE — Telephone Encounter (Signed)
Pt's wife stated that [facility] Mouthwash had been denied  Can med be resent

## 2016-12-07 MED ORDER — NYSTATIN NICU ORAL SYRINGE 100,000 UNITS/ML: 5.0000 mL | Freq: Four times a day (QID) | OROMUCOSAL | 5 refills | Status: DC

## 2016-12-07 NOTE — Telephone Encounter (Signed)
Sent Rx in electronically. Called the pharmacy as well to ask them to cancel the magic mouth wash.

## 2016-12-07 NOTE — Telephone Encounter (Signed)
Pt advised and voiced understanding.   

## 2016-12-16 ENCOUNTER — Telehealth: Payer: Self-pay | Admitting: Family Medicine

## 2016-12-16 NOTE — Telephone Encounter (Signed)
Copied from [place]. Topic: Quick Communication - Rx Refill/Question >> Dec 16, 2016  2:56 PM Lennox Solders wrote: Has the patient contacted their pharmacy? no (Agent: If no, request that the patient contact the pharmacy for the refill.) pt would like a new rx magic mouthwash. The medication was denied by insurance. Pt will pay out of pocket. The other medication is not working. [address] in [place]

## 2016-12-17 NOTE — Telephone Encounter (Signed)
Please advise on refill for magic mouthwash (would be a new prescription).

## 2016-12-17 NOTE — Telephone Encounter (Signed)
Please advise 

## 2016-12-22 MED ORDER — MAGIC MOUTHWASH: 5.0000 mL | Freq: Four times a day (QID) | ORAL | 3 refills | Status: DC | PRN

## 2016-12-22 NOTE — Telephone Encounter (Signed)
(  see the old order in his medication list). Call in [place], 237 ml with 3 rf

## 2016-12-22 NOTE — Telephone Encounter (Signed)
Called in Rx for pt.

## 2016-12-31 ENCOUNTER — Telehealth: Payer: Self-pay | Admitting: Family Medicine

## 2016-12-31 NOTE — Telephone Encounter (Signed)
Copied from [place] [id number]. Topic: Quick Communication - Rx Refill/Question >> Dec 31, 2016  4:11 PM Arletha Grippe wrote: Has the patient contacted their pharmacy? Yes.     (Agent: If no, request that the patient contact the pharmacy for the refill.)  fenofibrate (TRICOR) 145 MG tablet Preferred Pharmacy (with phone number or street name): ezetimibe (ZETIA) 10 MG tablet, losartan (COZAAR) 50 MG tablet, pharmacy is [facility], [place] Pt called to get refills and none were authorized. Cb # for pt is [phone]    Agent: Please be advised that RX refills may take up to 3 business days. We ask that you follow-up with your pharmacy.

## 2016-12-31 NOTE — Telephone Encounter (Signed)
zetia filled on 06/16/16 Cozaar filled on 02/19/16 / LOV 11/03/16 with Dr. Sarajane Jews / See patients request about authorization

## 2017-01-03 MED ORDER — FENOFIBRATE 145 MG PO TABS: 145.0000 mg | ORAL_TABLET | Freq: Every evening | ORAL | 1 refills | Status: DC

## 2017-01-03 MED ORDER — LOSARTAN POTASSIUM 50 MG PO TABS: 50.0000 mg | ORAL_TABLET | Freq: Every day | ORAL | 1 refills | Status: DC

## 2017-01-03 MED ORDER — EZETIMIBE 10 MG PO TABS: 10.0000 mg | ORAL_TABLET | Freq: Every day | ORAL | 1 refills | Status: DC

## 2017-01-03 NOTE — Telephone Encounter (Signed)
Cozaar, Zetia, and fenofibrate refilled and sent to [facility].

## 2017-01-17 ENCOUNTER — Ambulatory Visit (INDEPENDENT_AMBULATORY_CARE_PROVIDER_SITE_OTHER): Payer: PPO | Admitting: Family Medicine

## 2017-01-17 ENCOUNTER — Encounter: Payer: Self-pay | Admitting: Family Medicine

## 2017-01-17 VITALS — BP 132/80 | HR 58 | Temp 98.2°F | Wt 202.0 lb

## 2017-01-17 DIAGNOSIS — I2699 Other pulmonary embolism without acute cor pulmonale: Secondary | ICD-10-CM | POA: Diagnosis not present

## 2017-01-17 DIAGNOSIS — E785 Hyperlipidemia, unspecified: Secondary | ICD-10-CM | POA: Diagnosis not present

## 2017-01-17 DIAGNOSIS — I82401 Acute embolism and thrombosis of unspecified deep veins of right lower extremity: Secondary | ICD-10-CM

## 2017-01-17 LAB — HEPATIC FUNCTION PANEL
ALBUMIN: 4.5 g/dL (ref 3.5–5.2)
ALK PHOS: 59 U/L (ref 39–117)
ALT: 28 U/L (ref 0–53)
AST: 24 U/L (ref 0–37)
Bilirubin, Direct: 0.1 mg/dL (ref 0.0–0.3)
Total Bilirubin: 0.7 mg/dL (ref 0.2–1.2)
Total Protein: 6.9 g/dL (ref 6.0–8.3)

## 2017-01-17 LAB — LIPID PANEL
Cholesterol: 184 mg/dL (ref 0–200)
HDL: 38.7 mg/dL — AB (ref >39.00)
LDL Cholesterol: 111 mg/dL — ABNORMAL HIGH (ref 0–99)
NONHDL: 144.97
TRIGLYCERIDES: 171 mg/dL — AB (ref 0.0–149.0)
Total CHOL/HDL Ratio: 5
VLDL: 34.2 mg/dL (ref 0.0–40.0)

## 2017-01-17 NOTE — Progress Notes (Signed)
   Subjective:    Patient ID: Steven [name], male    DOB: [birth year], 67 y.o.   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 He has been on Xarelto since then. He feels well though he has slight SOB at times and mild discomfort in the right calf at times. No swelling.    Review of Systems  Constitutional: Negative.   Respiratory: Positive for shortness of breath. Negative for cough, chest tightness, wheezing and stridor.   Cardiovascular: Negative for chest pain, palpitations and leg swelling.  Neurological: Negative.        Objective:   Physical Exam  Constitutional: He is oriented to person, place, and time. He appears well-developed and well-nourished.  Cardiovascular: Normal rate, regular rhythm, normal heart sounds and intact distal pulses.  Pulmonary/Chest: Effort normal and breath sounds normal. No respiratory distress. He has no wheezes. He has no rales.  Musculoskeletal: He exhibits no edema.  Mildly tender in the right calf. No cords felt. Bevelyn Buckles is negative   Neurological: He is alert and oriented to person, place, and time.          Assessment & Plan:  Follow up on right leg DVT and PE's from July. We will set him up for right leg venous dopplers and a chest CTA soon.  Alysia Penna, MD

## 2017-01-18 ENCOUNTER — Other Ambulatory Visit: Payer: Self-pay

## 2017-01-18 ENCOUNTER — Other Ambulatory Visit (INDEPENDENT_AMBULATORY_CARE_PROVIDER_SITE_OTHER): Payer: PPO

## 2017-01-18 ENCOUNTER — Ambulatory Visit (INDEPENDENT_AMBULATORY_CARE_PROVIDER_SITE_OTHER)
Admission: RE | Admit: 2017-01-18 | Discharge: 2017-01-18 | Disposition: A | Discharge status: Home / Self Care | Payer: PPO | Source: Ambulatory Visit | Attending: Family Medicine | Admitting: Family Medicine

## 2017-01-18 DIAGNOSIS — Z0000 Encounter for general adult medical examination without abnormal findings: Secondary | ICD-10-CM

## 2017-01-18 DIAGNOSIS — I2699 Other pulmonary embolism without acute cor pulmonale: Secondary | ICD-10-CM | POA: Diagnosis not present

## 2017-01-18 DIAGNOSIS — R0609 Other forms of dyspnea: Secondary | ICD-10-CM | POA: Diagnosis not present

## 2017-01-18 LAB — BASIC METABOLIC PANEL
BUN: 21 mg/dL (ref 6–23)
CALCIUM: 9.4 mg/dL (ref 8.4–10.5)
CHLORIDE: 103 meq/L (ref 96–112)
CO2: 28 meq/L (ref 19–32)
CREATININE: 1.47 mg/dL (ref 0.40–1.50)
GFR: 50.6 mL/min — ABNORMAL LOW (ref >60.00)
GLUCOSE: 117 mg/dL — AB (ref 70–99)
Potassium: 3.8 mEq/L (ref 3.5–5.1)
Sodium: 138 mEq/L (ref 135–145)

## 2017-01-18 MED ORDER — IOPAMIDOL (ISOVUE-370) INJECTION 76%
80.0000 mL | Freq: Once | INTRAVENOUS | Status: AC | PRN
  Administered 2017-01-18: 80 mL via INTRAVENOUS

## 2017-01-20 ENCOUNTER — Ambulatory Visit (HOSPITAL_COMMUNITY)
Admission: RE | Admit: 2017-01-20 | Discharge: 2017-01-20 | Disposition: A | Discharge status: Home / Self Care | Payer: PPO | Source: Ambulatory Visit | Attending: Family Medicine | Admitting: Family Medicine

## 2017-01-20 DIAGNOSIS — I82401 Acute embolism and thrombosis of unspecified deep veins of right lower extremity: Secondary | ICD-10-CM | POA: Diagnosis not present

## 2017-03-15 ENCOUNTER — Ambulatory Visit: Payer: Self-pay

## 2017-03-15 ENCOUNTER — Ambulatory Visit (INDEPENDENT_AMBULATORY_CARE_PROVIDER_SITE_OTHER): Payer: PPO | Admitting: Family Medicine

## 2017-03-15 ENCOUNTER — Encounter: Payer: Self-pay | Admitting: Family Medicine

## 2017-03-15 VITALS — BP 120/80 | HR 54 | Temp 98.5°F | Wt 201.8 lb

## 2017-03-15 DIAGNOSIS — K5792 Diverticulitis of intestine, part unspecified, without perforation or abscess without bleeding: Secondary | ICD-10-CM

## 2017-03-15 DIAGNOSIS — J018 Other acute sinusitis: Secondary | ICD-10-CM

## 2017-03-15 MED ORDER — CEFUROXIME AXETIL 500 MG PO TABS: 500.0000 mg | ORAL_TABLET | Freq: Two times a day (BID) | ORAL | 0 refills | Status: DC

## 2017-03-15 MED ORDER — METRONIDAZOLE 500 MG PO TABS: 500.0000 mg | ORAL_TABLET | Freq: Three times a day (TID) | ORAL | 0 refills | Status: DC

## 2017-03-15 NOTE — Telephone Encounter (Signed)
Pt. Reports sinus symptoms became worse 2 days ago. Having blood-tinged, yellow discharge. Pain and pressure to face. Nasal sprays not helping. Appointment for today.  Reason for Disposition . [1] Sinus congestion (pressure, fullness) AND [2] present > 10 days  Answer Assessment - Initial Assessment Questions 1. LOCATION: "Where does it hurt?"      Pain and pressure to face 2. ONSET: "When did the sinus pain start?"  (e.g., hours, days)      Started 2 days 3. SEVERITY: "How bad is the pain?"   (Scale 1-10; mild, moderate or severe)   - MILD (1-3): doesn't interfere with normal activities    - MODERATE (4-7): interferes with normal activities (e.g., work or school) or awakens from sleep   - SEVERE (8-10): excruciating pain and patient unable to do any normal activities        Moderate 4. RECURRENT SYMPTOM: "Have you ever had sinus problems before?" If so, ask: "When was the last time?" and "What happened that time?"      Yes 5. NASAL CONGESTION: "Is the nose blocked?" If so, ask, "Can you open it or must you breathe through the mouth?"     Can blow his nose but then blocks up 6. NASAL DISCHARGE: "Do you have discharge from your nose?" If so ask, "What color?"     Yellow with blood 7. FEVER: "Do you have a fever?" If so, ask: "What is it, how was it measured, and when did it start?"      No 8. OTHER SYMPTOMS: "Do you have any other symptoms?" (e.g., sore throat, cough, earache, difficulty breathing)     No 9. PREGNANCY: "Is there any chance you are pregnant?" "When was your last menstrual period?"     No  Protocols used: SINUS PAIN OR CONGESTION-A-AH

## 2017-03-15 NOTE — Progress Notes (Signed)
   Subjective:    Patient ID: Steven [name], male    DOB: December 19, 1950, 67 y.o.   MRN: [id number]  HPI Here for 2 problems. First he has had intermittent LLQ abdominal pains for one week. No fever or nausea. His BMs are normal. No urinary symptoms. Of note he had diverticulitis in 2011. Also for the past 3 days he has had sinus pressure and congestion, headache, PND, and a dry cough.    Review of Systems  Constitutional: Negative.   HENT: Positive for congestion, postnasal drip, sinus pressure and sinus pain. Negative for facial swelling and sore throat.   Eyes: Negative.   Respiratory: Positive for cough and chest tightness.   Cardiovascular: Negative.   Gastrointestinal: Positive for abdominal pain. Negative for abdominal distention, anal bleeding, blood in stool, constipation, diarrhea, nausea, rectal pain and vomiting.       Objective:   Physical Exam  Constitutional: He appears well-developed and well-nourished.  HENT:  Right Ear: External ear normal.  Left Ear: External ear normal.  Nose: Nose normal.  Mouth/Throat: Oropharynx is clear and moist.  Eyes: Conjunctivae are normal.  Neck: Neck supple. No thyromegaly present.  Pulmonary/Chest: Effort normal and breath sounds normal. No respiratory distress. He has no wheezes. He has no rales.  Abdominal: Soft. Bowel sounds are normal. He exhibits no distension and no mass. There is no rebound and no guarding.  Mild LLQ tenderness  Lymphadenopathy:    He has no cervical adenopathy.          Assessment & Plan:  He has both diverticulitis and sinusitis. Treat with Ceftin and Flagyl for 10 days. Drink fluids.  Alysia Penna, MD

## 2017-04-07 ENCOUNTER — Other Ambulatory Visit: Payer: Self-pay | Admitting: Family Medicine

## 2017-04-07 NOTE — Telephone Encounter (Signed)
Last OV 03/15/2017   Last refilled 01/03/2017 disp 90 with 1 refill   Sent to PCP for approval

## 2017-05-10 ENCOUNTER — Ambulatory Visit: Payer: Self-pay | Admitting: *Deleted

## 2017-05-10 NOTE — Telephone Encounter (Signed)
Patient slipped stepping out of boat, yesterday. Hit Rt knee on the deck. Golf ball-sized contusion on inner aspect of rt knee, smaller in size this morning than it was last night. No laceration. He is able to bear weight this morning. No popping noted with walking. Stated it is sore more than painful. Advised RICE treatment today. Return call with any increased swelling or pain. Also reported his back was sore from the fall. Applying heat this morning.  Reason for Disposition . [1] Limp when walking AND [2] due to a direct blow  Answer Assessment - Initial Assessment Questions 1. MECHANISM: "How did the injury happen?" (e.g., twisting injury, direct blow)      Hit the inner aspect of knee. Slipped getting off boat and hit knee on dock. 2. ONSET: "When did the injury happen?" (Minutes or hours ago)      yesterday 3. LOCATION: "Where is the injury located?"    Inner aspect of rt knee 4. APPEARANCE of INJURY: "What does the injury look like?"    Size of a golf ball. Smaller this am than last night. Purple in color.  5. SEVERITY: "Can you put weight on that leg?" "Can you walk?"     Yes, can bear weight.  6. SIZE: For cuts, bruises, or swelling, ask: "How large is it?" (e.g., inches or centimeters;  entire joint)      Bigger than other knee says wife.  7. PAIN: "Is there pain?" If so, ask: "How bad is the pain?"    (e.g., Scale 1-10; or mild, moderate, severe)    2 sore to touch, sore to bend.  8. TETANUS: For any breaks in the skin, ask: "When was the last tetanus booster?"    no 9. OTHER SYMPTOMS: "Do you have any other symptoms?"  (e.g., "pop" when knee injured, swelling, locking, buckling)    Swelling only 10. PREGNANCY: "Is there any chance you are pregnant?" "When was your last menstrual period?"     no  Protocols used: KNEE INJURY-A-AH

## 2017-05-17 NOTE — Progress Notes (Addendum)
Subjective:   Steven [name] is a 67 y.o. male who presents for an Initial Medicare Annual Wellness Visit.  Reports health as good   Diet Chol/hdl ratio 5; hdl 38; trig 171  A1c 5.6  Breakfast eggs, deer sausage;  Bacon occasionally BLT's with tomatoes Lunch; is the big meal Normally meat and vegetables Supper Bakes some  Snacks popcorn Candy at times    BMI 28 - same as last year  Exercise Stays busy  Yard work; garden  Location manager and fishing    Health Maintenance Due  Topic Date Due  . Hepatitis C Screening  1950-07-08  . TETANUS/TDAP  03/11/2012  . PNA vac Low Risk Adult (2 of 2 - PPSV23) 09/07/2014   Will postpone hep c as she was not sure Insurance would be  Was told last year insurance would not pay   Had Prevnar in 09/06/2013 (one Prevnar as an adult) Will discuss repeat PSV 23 and agreed to take today     A Tetanus is recommended every 10 years. Medicare covers a tetanus if you have a cut or wound; otherwise, there may be a charge. If you had not had a tetanus with pertusses, known as the Tdap, please take this one next    PSA .55 10/2016 Colonoscopy 06/2015; repeat 06/2020    Objective:    Today's Vitals   05/18/17 0959  BP: 118/70  Pulse: 64  SpO2: 96%  Weight: 201 lb 6 oz (91.3 kg)  Height: 5' 10.5" (1.791 m)   Body mass index is 28.49 kg/m.  Advanced Directives 05/18/2017 07/19/2016 06/30/2015 06/05/2015 07/27/2013 02/08/2013 04/13/2012  Does Patient Have a Medical Advance Directive? Yes Yes Yes Yes Patient has advance directive, copy not in chart Patient has advance directive, copy not in chart Patient does not have advance directive  Type of Advance Directive - Living will;Healthcare Power of [place];Living will Living will - -  Does patient want to make changes to medical advance directive? - No - Patient declined - No - Patient declined - No -  Copy of Healthcare Power of Attorney in Chart? - No - copy requested  No - copy requested No - copy requested - Copy requested from family -  Pre-existing out of facility DNR order (yellow form or pink MOST form) - - - - - - No    Current Medications (verified) Outpatient Encounter Medications as of 05/18/2017  Medication Sig  . acyclovir cream (ZOVIRAX) 5 % Apply 1 application topically daily as needed (fever blisters).  . clobetasol cream (TEMOVATE) 5.28 % Apply 1 application topically daily as needed (herpes).   . Diphenhyd-Hydrocort-Nystatin (FIRST-DUKES MOUTHWASH) SUSP Use as directed 5 mLs in the mouth or throat every 6 (six) hours as needed (ulcers). Swish and spit  . eletriptan (RELPAX) 20 MG tablet Take 1 tablet (20 mg total) by mouth as needed for migraine.  . ezetimibe (ZETIA) 10 MG tablet Take 1 tablet by mouth every day  . fenofibrate (TRICOR) 145 MG tablet Take 1 tablet (145 mg total) by mouth every evening.  . folic acid (FOLVITE) 1 MG tablet Take 1 mg by mouth daily.  Marland Kitchen HYDROcodone-acetaminophen (NORCO) 10-325 MG tablet Take 1 tablet by mouth every 8 (eight) hours as needed for moderate pain.  Marland Kitchen losartan (COZAAR) 50 MG tablet Take 1 tablet (50 mg total) by mouth daily.  . RABEprazole (ACIPHEX) 20 MG tablet Take 1 tablet (20 mg total) by mouth 2 (two) times daily.  Marland Kitchen  rivaroxaban (XARELTO) 20 MG TABS tablet Take 1 tablet (20 mg total) by mouth daily with supper.  . temazepam (RESTORIL) 30 MG capsule Take 1 capsule (30 mg total) by mouth at bedtime as needed for sleep.  . [DISCONTINUED] cefUROXime (CEFTIN) 500 MG tablet Take 1 tablet (500 mg total) by mouth 2 (two) times daily with a meal.  . [DISCONTINUED] metroNIDAZOLE (FLAGYL) 500 MG tablet Take 1 tablet (500 mg total) by mouth 3 (three) times daily.   No facility-administered encounter medications on file as of 05/18/2017.     Allergies (verified) Morphine; Amitriptyline hcl; Amoxicillin-pot clavulanate; Cyclobenzaprine hcl; Levofloxacin; Lisinopril-hydrochlorothiazide; and Prednisone    History: Past Medical History:  Diagnosis Date  . Clotting disorder ([place])   . Displacement of intervertebral disc, site unspecified, without myelopathy   . Esophageal reflux   . Headache(784.0)    sinus and migraines  . Herpes simplex without mention of complication   . Hiatal hernia   . HTN (hypertension) 05/10/2012  . Hyperlipemia   . Hypertrophy (benign) of prostate   . Intestinal infection due to other organism, not elsewhere classified   . Kidney stones    sees Dr. Junious Silk  . Personal history of colonic polyps   . Personal history of urinary calculi   . Personal history of venous thrombosis and embolism   . Renal insufficiency    Past Surgical History:  Procedure Laterality Date  . APPENDECTOMY    . BOTOX INJECTION N/A 04/13/2012   Procedure: BOTOX INJECTION;  Surgeon: Milus Banister, MD;  Location: [facility];  Service: Endoscopy;  Laterality: N/A;  . [place]  . CHOLECYSTECTOMY    . COLONOSCOPY  07/03/2015   per Dr. Ardis Hughs, single polyp removed but not retrieved,  repeat in 5 yrs   . ESOPHAGEAL MANOMETRY N/A 02/26/2013   Procedure: ESOPHAGEAL MANOMETRY (EM);  Surgeon: Milus Banister, MD;  Location: [facility];  Service: Endoscopy;  Laterality: N/A;  . ESOPHAGOGASTRODUODENOSCOPY  02-24-10   per Dr. Ardis Hughs, small hiatal hernia only   . ESOPHAGOGASTRODUODENOSCOPY (EGD) WITH ESOPHAGEAL DILATION N/A 04/13/2012   Procedure: ESOPHAGOGASTRODUODENOSCOPY (EGD) WITH ESOPHAGEAL DILATION;  Surgeon: Milus Banister, MD;  Location: [facility];  Service: Endoscopy;  Laterality: N/A;  possible botox injection  . FINGER SURGERY     3rd finger trigger bilateral   . GALLBLADDER SURGERY    . LITHOTRIPSY    . ROTATOR CUFF REPAIR Right 01-10-12   per Dr. Esmond Plants, also repair torn biceps tendon   . STERIOD INJECTION Left 07/27/2013   Procedure: STEROID INJECTION;  Surgeon: Augustin Schooling, MD;  Location: [facility];  Service: Orthopedics;  Laterality: Left;   . TONSILLECTOMY    . TRIGGER FINGER RELEASE Right 07/27/2013   Procedure: RIGHT RING FINGER A-1 RELEASE;  Surgeon: Augustin Schooling, MD;  Location: [facility];  Service: Orthopedics;  Laterality: Right;  . URETERAL STENT PLACEMENT  July 2012   left ureter, per Dr. Junious Silk, for a stone    Family History  Problem Relation Age of Onset  . Colon cancer Father   . Heart attack Father   . Heart disease Brother   . Heart disease Brother    Social History   Socioeconomic History  . Marital status: Married    Spouse name: Not on file  . Number of children: Not on file  . Years of education: Not on file  . Highest education level: Not on file  Occupational History  .  Occupation: Retired  Scientific laboratory technician  . Financial resource strain: Not on file  . Food insecurity:    Worry: Not on file    Inability: Not on file  . Transportation needs:    Medical: Not on file    Non-medical: Not on file  Tobacco Use  . Smoking status: Former Smoker    Packs/day: 1.50    Years: 20.00    Pack years: 30.00    Types: Cigarettes    Last attempt to quit: 03/03/1973    Years since quitting: 44.2  . Smokeless tobacco: Never Used  . Tobacco comment: discussed AAA; did have CT of abd/ pelvis   Substance and Sexual Activity  . Alcohol use: No    Alcohol/week: 0.0 oz  . Drug use: No  . Sexual activity: Not on file  Lifestyle  . Physical activity:    Days per week: Not on file    Minutes per session: Not on file  . Stress: Not on file  Relationships  . Social connections:    Talks on phone: Not on file    Gets together: Not on file    Attends religious service: Not on file    Active member of club or organization: Not on file    Attends meetings of clubs or organizations: Not on file    Relationship status: Not on file  Other Topics Concern  . Not on file  Social History Narrative  . Not on file   Tobacco Counseling Counseling given: Yes Comment: discussed AAA; did have CT of abd/ pelvis     Clinical Intake:    Activities of Daily Living In your present state of health, do you have any difficulty performing the following activities: 05/18/2017 07/19/2016  Hearing? N N  Vision? N N  Difficulty concentrating or making decisions? N Y  Walking or climbing stairs? N N  Dressing or bathing? N Y  Doing errands, shopping? N N  Preparing Food and eating ? N -  Using the Toilet? N -  In the past six months, have you accidently leaked urine? N -  Do you have problems with loss of bowel control? N -  Managing your Medications? N -  Managing your Finances? N -  Housekeeping or managing your Housekeeping? N -  Some recent data might be hidden     Immunizations and Health Maintenance Immunization History  Administered Date(s) Administered  . Influenza Whole 10/24/2006, 10/11/2007  . Influenza, High Dose Seasonal PF 11/04/2014, 10/07/2015, 11/03/2016  . Influenza,inj,Quad PF,6+ Mos 09/06/2013  . Pneumococcal Conjugate-13 09/06/2013  . Pneumococcal Polysaccharide-23 04/14/2007, 05/18/2017  . Td 03/12/2002    Patient Care Team: Laurey Morale, MD as PCP - General  Indicate any recent Medical Services you may have received from other than [facility] providers in the past year (date may be approximate).    Assessment:   This is a routine wellness examination for [place].  Hearing/Vision screen  Hearing Screening   125Hz  250Hz  500Hz  1000Hz  2000Hz  3000Hz  4000Hz  6000Hz  8000Hz   Right ear:    100       Left ear:    100       Comments: Has Dr. Claiborne Rigg in [place] Will have a hearing screen  Vision Screening Comments: Vision checks q 2 years Doctor is in [place]  Dietary issues and exercise activities discussed: Current Exercise Habits: Home exercise routine, Time (Minutes): 60, Frequency (Times/Week): 5(mild to mod, always busy), Weekly Exercise (Minutes/Week): 300  Goals    .  Patient Stated     Stay healthy and continue to enjoy your life       Depression Screen PHQ  2/9 Scores 05/18/2017 06/24/2014  PHQ - 2 Score 0 0    Fall Risk Fall Risk  05/18/2017 06/24/2014  Falls in the past year? Yes No  Comment going from boat to dock  -  Number falls in past yr: 1 -  Follow up Education provided -   Has hematoma around right interior aspect of right knee. States it is turning blue but much smaller than it was.  Happened approx 1.5 weeks ago.  No issues moving or using knee. Will report if swelling increases with pain or starts to have note increased redness around knee     Cognitive Function: Ad8 score reviewed for issues:  Issues making decisions:  Less interest in hobbies / activities:  Repeats questions, stories (family complaining):  Trouble using ordinary gadgets (microwave, computer, phone):  Forgets the month or year:   Mismanaging finances:   Remembering appts:  Daily problems with thinking and/or memory: Ad8 score is=0     MMSE - Mini Mental State Exam 05/18/2017  Not completed: (No Data)     no issues with independent living   Screening Tests Health Maintenance  Topic Date Due  . Hepatitis C Screening  1950-11-20  . TETANUS/TDAP  03/11/2012  . PNA vac Low Risk Adult (2 of 2 - PPSV23) 09/07/2014  . INFLUENZA VACCINE  08/11/2017  . COLONOSCOPY  07/02/2020         Plan:     PCP Notes   Health Maintenance Rec'd PSV 23 today Will take Tdap later  Educated regarding shingrix   Abnormal Screens  Hearing 1000hz ; will have a hearing screen by [facility] provider, will fup with hearing aids as needed.  Referrals  none  Patient concerns; Fell 1.5 weeks ago between dock and boat. States right knee was very swollen and he put ice on it. Now swelling is still noted but decreased 50% from original  No issues with knee; still functional  Will come in if his knee starts to swell again or decreases in function or notes redness etc.   Nurse Concerns; As noted   Next PCP apt To schedule in the fall when it is due        I have personally reviewed and noted the following in the patient's chart:   . Medical and social history . Use of alcohol, tobacco or illicit drugs  . Current medications and supplements . Functional ability and status . Nutritional status . Physical activity . Advanced directives . List of other physicians . Hospitalizations, surgeries, and ER visits in previous 12 months . Vitals . Screenings to include cognitive, depression, and falls . Referrals and appointments  In addition, I have reviewed and discussed with patient certain preventive protocols, quality metrics, and best practice recommendations. A written personalized care plan for preventive services as well as general preventive health recommendations were provided to patient.     AYTKZ,SWFUX, RN   05/18/2017    I have read this note and agree with its contents.  Alysia Penna, MD

## 2017-05-18 ENCOUNTER — Ambulatory Visit (INDEPENDENT_AMBULATORY_CARE_PROVIDER_SITE_OTHER): Payer: PPO

## 2017-05-18 DIAGNOSIS — Z23 Encounter for immunization: Secondary | ICD-10-CM

## 2017-05-18 DIAGNOSIS — Z0000 Encounter for general adult medical examination without abnormal findings: Secondary | ICD-10-CM | POA: Diagnosis not present

## 2017-05-18 NOTE — Patient Instructions (Addendum)
Steven [name] , Thank you for taking time to come for your Medicare Wellness Visit. I appreciate your ongoing commitment to your health goals. Please review the following plan we discussed and let me know if I can assist you in the future.   Hold hep c until insurance checked   A Tetanus is recommended every 10 years. Medicare covers a tetanus if you have a cut or wound; otherwise, there may be a charge. If you had not had a tetanus with pertusses, known as the Tdap, you can take this anytime.   Will take the psv 23 today   Shingrix is a vaccine for the prevention of Shingles in Adults 50 and older.  If you are on Medicare, the shingrix is covered under your Part D plan, so you will take both of the vaccines in the series at your pharmacy. Please check with your benefits regarding applicable copays or out of pocket expenses.  The Shingrix is given in 2 vaccines approx 8 weeks apart. You must receive the 2nd dose prior to 6 months from receipt of the first. Please have the pharmacist print out you Immunization  dates for our office records   Will have a hearing screen at your [place] - can assist with hearing aid x 1  No reviews  [facility]  [phone]  [phone] - Denis hayes   [email or web address]    These are the goals we discussed: Goals    . Patient Stated     Stay healthy and continue to enjoy your life        This is a list of the screening recommended for you and due dates:  Health Maintenance  Topic Date Due  .  Hepatitis C: One time screening is recommended by Center for Disease Control  (CDC) for  adults born from [birth year] through 1967.   1950/09/09  . Tetanus Vaccine  03/11/2012  . Pneumonia vaccines (2 of 2 - PPSV23) 09/07/2014  . Flu Shot  08/11/2017  . Colon Cancer Screening  07/02/2020   Prevention of  falls: Remove rugs or any tripping hazards in the home Use Non slip mats in bathtubs and showers Placing grab bars next to the toilet and or shower Placing handrails on both sides of the stair way Adding extra lighting in the home.   Personal safety issues reviewed:  1. Consider starting a community watch program per [facility] 2.  Changes batteries is smoke detector and/or carbon monoxide detector  3.  If you have firearms; keep them in a safe place 4.  Wear protection when in the sun; Always wear sunscreen or a hat; It is good to have your doctor check your skin annually or review any new areas of concern 5. Driving safety; Keep in the right lane; stay 3 car lengths behind the car in front of you on the highway; look 3 times prior to pulling out; carry your cell phone everywhere you go!      Fall Prevention in the Home Falls can cause injuries. They can happen to people of all ages. There are many things you can do to make your home safe and to help prevent falls. What can I do on the outside of my home?  Regularly fix the edges of walkways and driveways and fix any cracks.  Remove anything that might make you trip as you walk through a door, such as a raised step  or threshold.  Trim any bushes or trees on the path to your home.  Use bright outdoor lighting.  Clear any walking paths of anything that might make someone trip, such as rocks or tools.  Regularly check to see if handrails are loose or broken. Make sure that both sides of any steps have handrails.  Any raised decks and porches should have guardrails on the edges.  Have any leaves, snow, or ice cleared regularly.  Use sand or salt on walking paths during winter.  Clean up any spills in your garage right away. This includes oil or grease spills. What can I do in the bathroom?  Use night lights.  Install grab bars by the toilet and in the tub and shower. Do not use towel bars as grab bars.  Use  non-skid mats or decals in the tub or shower.  If you need to sit down in the shower, use a plastic, non-slip stool.  Keep the floor dry. Clean up any water that spills on the floor as soon as it happens.  Remove soap buildup in the tub or shower regularly.  Attach bath mats securely with double-sided non-slip rug tape.  Do not have throw rugs and other things on the floor that can make you trip. What can I do in the bedroom?  Use night lights.  Make sure that you have a light by your bed that is easy to reach.  Do not use any sheets or blankets that are too big for your bed. They should not hang down onto the floor.  Have a firm chair that has side arms. You can use this for support while you get dressed.  Do not have throw rugs and other things on the floor that can make you trip. What can I do in the kitchen?  Clean up any spills right away.  Avoid walking on wet floors.  Keep items that you use a lot in easy-to-reach places.  If you need to reach something above you, use a strong step stool that has a grab bar.  Keep electrical cords out of the way.  Do not use floor polish or wax that makes floors slippery. If you must use wax, use non-skid floor wax.  Do not have throw rugs and other things on the floor that can make you trip. What can I do with my stairs?  Do not leave any items on the stairs.  Make sure that there are handrails on both sides of the stairs and use them. Fix handrails that are broken or loose. Make sure that handrails are as long as the stairways.  Check any carpeting to make sure that it is firmly attached to the stairs. Fix any carpet that is loose or worn.  Avoid having throw rugs at the top or bottom of the stairs. If you do have throw rugs, attach them to the floor with carpet tape.  Make sure that you have a light switch at the top of the stairs and the bottom of the stairs. If you do not have them, ask someone to add them for you. What  else can I do to help prevent falls?  Wear shoes that: ? Do not have high heels. ? Have rubber bottoms. ? Are comfortable and fit you well. ? Are closed at the toe. Do not wear sandals.  If you use a stepladder: ? Make sure that it is fully opened. Do not climb a closed stepladder. ? Make sure that both  sides of the stepladder are locked into place. ? Ask someone to hold it for you, if possible.  Clearly mark and make sure that you can see: ? Any grab bars or handrails. ? First and last steps. ? Where the edge of each step is.  Use tools that help you move around (mobility aids) if they are needed. These include: ? Canes. ? Walkers. ? Scooters. ? Crutches.  Turn on the lights when you go into a dark area. Replace any light bulbs as soon as they burn out.  Set up your furniture so you have a clear path. Avoid moving your furniture around.  If any of your floors are uneven, fix them.  If there are any pets around you, be aware of where they are.  Review your medicines with your doctor. Some medicines can make you feel dizzy. This can increase your chance of falling. Ask your doctor what other things that you can do to help prevent falls. This information is not intended to replace advice given to you by your health care provider. Make sure you discuss any questions you have with your health care provider. Document Released: 10/24/2008 Document Revised: 06/05/2015 Document Reviewed: 02/01/2014 Elsevier Interactive Patient Education  2018 [place] Maintenance, Male A healthy lifestyle and preventive care is important for your health and wellness. Ask your health care provider about what schedule of regular examinations is right for you. What should I know about weight and diet? Eat a Healthy Diet  Eat plenty of vegetables, fruits, whole grains, low-fat dairy products, and lean protein.  Do not eat a lot of foods high in solid fats, added sugars, or  salt.  Maintain a Healthy Weight Regular exercise can help you achieve or maintain a healthy weight. You should:  Do at least 150 minutes of exercise each week. The exercise should increase your heart rate and make you sweat (moderate-intensity exercise).  Do strength-training exercises at least twice a week.  Watch Your Levels of Cholesterol and Blood Lipids  Have your blood tested for lipids and cholesterol every 5 years starting at 69 years of age. If you are at high risk for heart disease, you should start having your blood tested when you are 69 years old. You may need to have your cholesterol levels checked more often if: ? Your lipid or cholesterol levels are high. ? You are older than 69 years of age. ? You are at high risk for heart disease.  What should I know about cancer screening? Many types of cancers can be detected early and may often be prevented. Lung Cancer  You should be screened every year for lung cancer if: ? You are a current smoker who has smoked for at least 30 years. ? You are a former smoker who has quit within the past 15 years.  Talk to your health care provider about your screening options, when you should start screening, and how often you should be screened.  Colorectal Cancer  Routine colorectal cancer screening usually begins at 69 years of age and should be repeated every 5-10 years until you are 69 years old. You may need to be screened more often if early forms of precancerous polyps or small growths are found. Your health care provider may recommend screening at an earlier age if you have risk factors for colon cancer.  Your health care provider may recommend using home test kits to check for hidden blood in the stool.  A small  camera at the end of a tube can be used to examine your colon (sigmoidoscopy or colonoscopy). This checks for the earliest forms of colorectal cancer.  Prostate and Testicular Cancer  Depending on your age and overall  health, your health care provider may do certain tests to screen for prostate and testicular cancer.  Talk to your health care provider about any symptoms or concerns you have about testicular or prostate cancer.  Skin Cancer  Check your skin from head to toe regularly.  Tell your health care provider about any new moles or changes in moles, especially if: ? There is a change in a mole's size, shape, or color. ? You have a mole that is larger than a pencil eraser.  Always use sunscreen. Apply sunscreen liberally and repeat throughout the day.  Protect yourself by wearing long sleeves, pants, a wide-brimmed hat, and sunglasses when outside.  What should I know about heart disease, diabetes, and high blood pressure?  If you are 85-54 years of age, have your blood pressure checked every 3-5 years. If you are 84 years of age or older, have your blood pressure checked every year. You should have your blood pressure measured twice-once when you are at a hospital or clinic, and once when you are not at a hospital or clinic. Record the average of the two measurements. To check your blood pressure when you are not at a hospital or clinic, you can use: ? An automated blood pressure machine at a pharmacy. ? A home blood pressure monitor.  Talk to your health care provider about your target blood pressure.  If you are between 51-58 years old, ask your health care provider if you should take aspirin to prevent heart disease.  Have regular diabetes screenings by checking your fasting blood sugar level. ? If you are at a normal weight and have a low risk for diabetes, have this test once every three years after the age of 66. ? If you are overweight and have a high risk for diabetes, consider being tested at a younger age or more often.  A one-time screening for abdominal aortic aneurysm (AAA) by ultrasound is recommended for men aged 85-75 years who are current or former smokers. What should I know  about preventing infection? Hepatitis B If you have a higher risk for hepatitis B, you should be screened for this virus. Talk with your health care provider to find out if you are at risk for hepatitis B infection. Hepatitis C Blood testing is recommended for:  Everyone born from [birth year] through 1967.  Anyone with known risk factors for hepatitis C.  Sexually Transmitted Diseases (STDs)  You should be screened each year for STDs including gonorrhea and chlamydia if: ? You are sexually active and are younger than 69 years of age. ? You are older than 69 years of age and your health care provider tells you that you are at risk for this type of infection. ? Your sexual activity has changed since you were last screened and you are at an increased risk for chlamydia or gonorrhea. Ask your health care provider if you are at risk.  Talk with your health care provider about whether you are at high risk of being infected with HIV. Your health care provider may recommend a prescription medicine to help prevent HIV infection.  What else can I do?  Schedule regular health, dental, and eye exams.  Stay current with your vaccines (immunizations).  Do not use any tobacco  products, such as cigarettes, chewing tobacco, and e-cigarettes. If you need help quitting, ask your health care provider.  Limit alcohol intake to no more than 2 drinks per day. One drink equals 12 ounces of beer, 5 ounces of wine, or 1 ounces of hard liquor.  Do not use street drugs.  Do not share needles.  Ask your health care provider for help if you need support or information about quitting drugs.  Tell your health care provider if you often feel depressed.  Tell your health care provider if you have ever been abused or do not feel safe at home. This information is not intended to replace advice given to you by your health care provider. Make sure you discuss any questions you have with your health care  provider. Document Released: 06/26/2007 Document Revised: 08/27/2015 Document Reviewed: 10/01/2014 Elsevier Interactive Patient Education  2018 [employer].   Hearing Loss Hearing loss is a partial or total loss of the ability to hear. This can be temporary or permanent, and it can happen in one or both ears. Hearing loss may be referred to as deafness. Medical care is necessary to treat hearing loss properly and to prevent the condition from getting worse. Your hearing may partially or completely come back, depending on what caused your hearing loss and how severe it is. In some cases, hearing loss is permanent. What are the causes? Common causes of hearing loss include:  Too much wax in the ear canal.  Infection of the ear canal or middle ear.  Fluid in the middle ear.  Injury to the ear or surrounding area.  An object stuck in the ear.  Prolonged exposure to loud sounds, such as music.  Less common causes of hearing loss include:  Tumors in the ear.  Viral or bacterial infections, such as meningitis.  A hole in the eardrum (perforated eardrum).  Problems with the hearing nerve that sends signals between the brain and the ear.  Certain medicines.  What are the signs or symptoms? Symptoms of this condition may include:  Difficulty telling the difference between sounds.  Difficulty following a conversation when there is background noise.  Lack of response to sounds in your environment. This may be most noticeable when you do not respond to startling sounds.  Needing to turn up the volume on the television, radio, etc.  Ringing in the ears.  Dizziness.  Pain in the ears.  How is this diagnosed? This condition is diagnosed based on a physical exam and a hearing test (audiometry). The audiometry test will be performed by a hearing specialist (audiologist). You may also be referred to an ear, nose, and throat (ENT) specialist (otolaryngologist). How is this  treated? Treatment for recent onset of hearing loss may include:  Ear wax removal.  Being prescribed medicines to prevent infection (antibiotics).  Being prescribed medicines to reduce inflammation (corticosteroids).  Follow these instructions at home:  If you were prescribed an antibiotic medicine, take it as told by your health care provider. Do not stop taking the antibiotic even if you start to feel better.  Take over-the-counter and prescription medicines only as told by your health care provider.  Avoid loud noises.  Return to your normal activities as told by your health care provider. Ask your health care provider what activities are safe for you.  Keep all follow-up visits as told by your health care provider. This is important. Contact a health care provider if:  You feel dizzy.  You develop  new symptoms.  You vomit or feel nauseous.  You have a fever. Get help right away if:  You develop sudden changes in your vision.  You have severe ear pain.  You have new or increased weakness.  You have a severe headache. This information is not intended to replace advice given to you by your health care provider. Make sure you discuss any questions you have with your health care provider. Document Released: 12/28/2004 Document Revised: 06/05/2015 Document Reviewed: 05/15/2014 Elsevier Interactive Patient Education  2018 [employer].

## 2017-06-30 DIAGNOSIS — H11111 Conjunctival deposits, right eye: Secondary | ICD-10-CM | POA: Diagnosis not present

## 2017-07-05 DIAGNOSIS — N401 Enlarged prostate with lower urinary tract symptoms: Secondary | ICD-10-CM | POA: Diagnosis not present

## 2017-07-05 DIAGNOSIS — R311 Benign essential microscopic hematuria: Secondary | ICD-10-CM | POA: Diagnosis not present

## 2017-07-05 DIAGNOSIS — R3911 Hesitancy of micturition: Secondary | ICD-10-CM | POA: Diagnosis not present

## 2017-07-08 ENCOUNTER — Telehealth: Payer: Self-pay | Admitting: Family Medicine

## 2017-07-08 NOTE — Telephone Encounter (Signed)
Copied from [place] [phone]. Topic: Quick Communication - Rx Refill/Question >> Jul 08, 2017  9:55 AM Boyd Kerbs wrote: Medication:   rivaroxaban (XARELTO) 20 MG TABS tablet  (90 day supply for each) ezetimibe (ZETIA) 10 MG tablet fenofibrate (TRICOR) 145 MG tablet  (has some left) losartan (COZAAR) 50 MG tablet   Has the patient contacted their pharmacy? No. (Agent: If no, request that the patient contact the pharmacy for the refill.) (Agent: If yes, when and what did the pharmacy advise?)  Preferred Pharmacy (with phone number or street name):   [facility] [place], [address] [place] [address] Phone: [phone] Fax: [phone]    Agent: Please be advised that RX refills may take up to 3 business days. We ask that you follow-up with your pharmacy.

## 2017-07-09 MED ORDER — RIVAROXABAN 20 MG PO TABS: 20.0000 mg | ORAL_TABLET | Freq: Every day | ORAL | 1 refills | Status: DC

## 2017-07-09 MED ORDER — FENOFIBRATE 145 MG PO TABS: 145.0000 mg | ORAL_TABLET | Freq: Every evening | ORAL | 1 refills | Status: DC

## 2017-07-09 MED ORDER — LOSARTAN POTASSIUM 50 MG PO TABS: 50.0000 mg | ORAL_TABLET | Freq: Every day | ORAL | 1 refills | Status: DC

## 2017-07-09 NOTE — Telephone Encounter (Signed)
Steven [name] called and verified the receipt of Ezetimibe 10 mg in 04/08/17 90 supply/3 refills.

## 2017-07-25 DIAGNOSIS — N200 Calculus of kidney: Secondary | ICD-10-CM | POA: Diagnosis not present

## 2017-07-25 DIAGNOSIS — R311 Benign essential microscopic hematuria: Secondary | ICD-10-CM | POA: Diagnosis not present

## 2017-08-09 DIAGNOSIS — N200 Calculus of kidney: Secondary | ICD-10-CM | POA: Diagnosis not present

## 2017-08-09 DIAGNOSIS — R311 Benign essential microscopic hematuria: Secondary | ICD-10-CM | POA: Diagnosis not present

## 2017-08-22 ENCOUNTER — Ambulatory Visit (INDEPENDENT_AMBULATORY_CARE_PROVIDER_SITE_OTHER): Payer: PPO | Admitting: Family Medicine

## 2017-08-22 ENCOUNTER — Encounter: Payer: Self-pay | Admitting: Family Medicine

## 2017-08-22 ENCOUNTER — Ambulatory Visit: Payer: Self-pay | Admitting: *Deleted

## 2017-08-22 VITALS — BP 140/70 | HR 67 | Temp 98.0°F | Wt 205.2 lb

## 2017-08-22 DIAGNOSIS — R1030 Lower abdominal pain, unspecified: Secondary | ICD-10-CM | POA: Diagnosis not present

## 2017-08-22 DIAGNOSIS — K5792 Diverticulitis of intestine, part unspecified, without perforation or abscess without bleeding: Secondary | ICD-10-CM | POA: Diagnosis not present

## 2017-08-22 LAB — POCT URINALYSIS DIPSTICK
BILIRUBIN UA: NEGATIVE
GLUCOSE UA: NEGATIVE
Ketones, UA: NEGATIVE
Leukocytes, UA: NEGATIVE
Nitrite, UA: NEGATIVE
PH UA: 7 (ref 5.0–8.0)
Protein, UA: NEGATIVE
Spec Grav, UA: 1.015 (ref 1.010–1.025)
Urobilinogen, UA: 0.2 E.U./dL

## 2017-08-22 MED ORDER — CEFUROXIME AXETIL 500 MG PO TABS: 500.0000 mg | ORAL_TABLET | Freq: Two times a day (BID) | ORAL | 0 refills | Status: DC

## 2017-08-22 MED ORDER — METRONIDAZOLE 500 MG PO TABS: 500.0000 mg | ORAL_TABLET | Freq: Three times a day (TID) | ORAL | 0 refills | Status: DC

## 2017-08-22 NOTE — Telephone Encounter (Signed)
Wife called for patient having abd pain since Saturday. He feels bloated. Had bowel movement this morning and felt some relief. Pain # 4 or 5 before bowel movement. He denies nausea and vomiting. Unable to eat because of the bloating.  Denies fever.  Appointment scheduled per protocol for this morning.    Reason for Disposition . [1] MILD-MODERATE pain AND [2] constant AND [3] present > 2 hours  Answer Assessment - Initial Assessment Questions 1. LOCATION: "Where does it hurt?"      Whole abd 2. RADIATION: "Does the pain shoot anywhere else?" (e.g., chest, back)     Towards the bottom 3. ONSET: "When did the pain begin?" (Minutes, hours or days ago)      Saturday 4. SUDDEN: "Gradual or sudden onset?"     gradual 5. PATTERN "Does the pain come and go, or is it constant?"    - If constant: "Is it getting better, staying the same, or worsening?"      (Note: Constant means the pain never goes away completely; most serious pain is constant and it progresses)     - If intermittent: "How long does it last?" "Do you have pain now?"     (Note: Intermittent means the pain goes away completely between bouts)     constant 6. SEVERITY: "How bad is the pain?"  (e.g., Scale 1-10; mild, moderate, or severe)    - MILD (1-3): doesn't interfere with normal activities, abdomen soft and not tender to touch     - MODERATE (4-7): interferes with normal activities or awakens from sleep, tender to touch     - SEVERE (8-10): excruciating pain, doubled over, unable to do any normal activities       Pain # 4 or 5 7. RECURRENT SYMPTOM: "Have you ever had this type of abdominal pain before?" If so, ask: "When was the last time?" and "What happened that time?"     Does not recall  8. CAUSE: "What do you think is causing the abdominal pain?"     Not sure 9. RELIEVING/AGGRAVATING FACTORS: "What makes it better or worse?" (e.g., movement, antacids, bowel movement)     Having a bowel movement 10. OTHER SYMPTOMS: "Has  there been any vomiting, diarrhea, constipation, or urine problems?"       Diarrhea, bloating  Protocols used: ABDOMINAL PAIN - MALE-A-AH

## 2017-08-22 NOTE — Patient Instructions (Signed)
Let us know if any worsening of symptoms.   OK to stop antibiotics after 5 days if you are symptom free. If not symptom free; complete antibiotic course.

## 2017-08-22 NOTE — Telephone Encounter (Signed)
Patient is on the schedule this morning 08/22/2017 here in office.

## 2017-08-22 NOTE — Telephone Encounter (Signed)
Pt stated the bowel movement was close being diarrhea. No blood noted. No problems urinating.

## 2017-08-22 NOTE — Progress Notes (Signed)
Steven [name] DOB: 03-28-1950 Encounter date: 08/22/2017  This is a 67 y.o. male who presents with Chief Complaint  Patient presents with  . Abdominal Pain    bloating nad tightness started staurday, bloating every time he eats and doesn't seem to go away, regular bowel movements, has gas, has not tried any OTC meds,  lower abdominal pain  all the way across, occassional mid- abdominal pain     History of present illness:  This morning even starting to eat breakfast felt like food didn't want to go down. Has had problems with esophagus in past, but not like it is now.   Pain seems to come and go, but feels more bloated, uncomfortable.   Bowels are normal. No nausea, no vomiting.   No fevers or chills   Has had some reflux. Not necessarily worse than normal. Had it bad a couple of weeks ago; went away on own.  Not certain what triggered sx on sat. Feels like abdominal sx are worsening.  Some nasal congestion last few days.      Allergies  Allergen Reactions  . Morphine Other (See Comments)    headache  . Amitriptyline Hcl Other (See Comments)    Mood changes  . Amoxicillin-Pot Clavulanate     REACTION: nausea  . Cyclobenzaprine Hcl     unknown  . Levofloxacin Swelling  . Lisinopril-Hydrochlorothiazide     depression  . Prednisone Other (See Comments)    Blurred vision   Current Meds  Medication Sig  . acyclovir cream (ZOVIRAX) 5 % Apply 1 application topically daily as needed (fever blisters).  . clobetasol cream (TEMOVATE) 0.27 % Apply 1 application topically daily as needed (herpes).   . Diphenhyd-Hydrocort-Nystatin (FIRST-DUKES MOUTHWASH) SUSP Use as directed 5 mLs in the mouth or throat every 6 (six) hours as needed (ulcers). Swish and spit  . eletriptan (RELPAX) 20 MG tablet Take 1 tablet (20 mg total) by mouth as needed for migraine.  . ezetimibe (ZETIA) 10 MG tablet Take 1 tablet by mouth every day  . fenofibrate (TRICOR) 145 MG tablet Take 1 tablet (145 mg  total) by mouth every evening.  . folic acid (FOLVITE) 1 MG tablet Take 1 mg by mouth daily.  Marland Kitchen HYDROcodone-acetaminophen (NORCO) 10-325 MG tablet Take 1 tablet by mouth every 8 (eight) hours as needed for moderate pain.  Marland Kitchen losartan (COZAAR) 50 MG tablet Take 1 tablet (50 mg total) by mouth daily.  . RABEprazole (ACIPHEX) 20 MG tablet Take 1 tablet (20 mg total) by mouth 2 (two) times daily.  . rivaroxaban (XARELTO) 20 MG TABS tablet Take 1 tablet (20 mg total) by mouth daily with supper.  . temazepam (RESTORIL) 30 MG capsule Take 1 capsule (30 mg total) by mouth at bedtime as needed for sleep.    Review of Systems  Constitutional: Positive for appetite change (decreased). Negative for chills and fever.  HENT: Positive for congestion (feels that allergies are kicking in).   Respiratory: Negative for chest tightness, shortness of breath and wheezing.   Cardiovascular: Negative for chest pain.  Gastrointestinal: Positive for abdominal distention and abdominal pain. Negative for anal bleeding, blood in stool, constipation, diarrhea (no diarrhea but stools have been soft), nausea and vomiting.  Genitourinary: Negative for difficulty urinating (stream is not as strong), dysuria and frequency.  Musculoskeletal: Negative for back pain.    Objective:  BP 140/70 (BP Location: Left Arm, Patient Position: Sitting, Cuff Size: Normal)   Pulse 67   Temp 98  F (36.7 C) (Oral)   Wt 205 lb 3.2 oz (93.1 kg)   SpO2 96%   BMI 29.03 kg/m   Weight: 205 lb 3.2 oz (93.1 kg)   BP Readings from Last 3 Encounters:  08/22/17 140/70  05/18/17 118/70  03/15/17 120/80   Wt Readings from Last 3 Encounters:  08/22/17 205 lb 3.2 oz (93.1 kg)  05/18/17 201 lb 6 oz (91.3 kg)  03/15/17 201 lb 12.8 oz (91.5 kg)    Physical Exam  Constitutional: He appears well-developed and well-nourished. No distress.  Cardiovascular: Normal rate, regular rhythm and normal heart sounds. Exam reveals no friction rub.  No  murmur heard. Pulmonary/Chest: Effort normal and breath sounds normal. No respiratory distress. He has no wheezes. He has no rales.  Abdominal: Soft. Normal appearance and bowel sounds are normal. He exhibits no fluid wave, no abdominal bruit and no mass. There is no hepatosplenomegaly. There is tenderness in the suprapubic area. There is no CVA tenderness, no tenderness at McBurney's point and negative Murphy's sign. Guarding: mild LLQ.  Genitourinary: Rectal exam shows no tenderness and guaiac negative stool. Prostate is not tender.  Psychiatric: He has a normal mood and affect.    Assessment/Plan 1. Lower abdominal pain Trace blood on UA; this is not new finding for him and he does follow with urology. - POC Urinalysis Dipstick  2. Diverticulitis He tolerated treatment well in March when he had a similar flair. We will keep treatment the same; ok to stop abx after 5 days if symptom free otherwise complete course. Fluids, diet reviewed. Discussed limiting acid reflux triggers as well to help with bloating and reflux. Let us know if any worsening of symptoms or if not resolved at end of antibiotic course.  - cefUROXime (CEFTIN) 500 MG tablet; Take 1 tablet (500 mg total) by mouth 2 (two) times daily with a meal.  Dispense: 20 tablet; Refill: 0 - metroNIDAZOLE (FLAGYL) 500 MG tablet; Take 1 tablet (500 mg total) by mouth 3 (three) times daily.  Dispense: 30 tablet; Refill: 0 Return if symptoms worsen or fail to improve.       Micheline Rough, MD

## 2017-08-23 ENCOUNTER — Ambulatory Visit: Payer: PPO | Admitting: Family Medicine

## 2017-09-05 ENCOUNTER — Encounter: Payer: Self-pay | Admitting: Family Medicine

## 2017-09-05 ENCOUNTER — Ambulatory Visit (INDEPENDENT_AMBULATORY_CARE_PROVIDER_SITE_OTHER): Payer: PPO | Admitting: Family Medicine

## 2017-09-05 VITALS — BP 110/78 | HR 70 | Temp 98.7°F | Ht 70.5 in | Wt 198.6 lb

## 2017-09-05 DIAGNOSIS — B360 Pityriasis versicolor: Secondary | ICD-10-CM

## 2017-09-05 DIAGNOSIS — G500 Trigeminal neuralgia: Secondary | ICD-10-CM | POA: Diagnosis not present

## 2017-09-05 MED ORDER — KETOCONAZOLE 200 MG PO TABS: 200.0000 mg | ORAL_TABLET | Freq: Two times a day (BID) | ORAL | 1 refills | Status: DC

## 2017-09-05 MED ORDER — METHYLPREDNISOLONE 4 MG PO TBPK: ORAL_TABLET | ORAL | 0 refills | Status: DC

## 2017-09-05 NOTE — Progress Notes (Signed)
   Subjective:    Patient ID: Steven [name], male    DOB: March 09, 1950, 67 y.o.   MRN: [id number]  HPI Here for 2 weeks of intermittent sharp knife-like pains in the right forehead, the right cheek, and behind the right eye. No vision changes. No rashes seen. No neurologic deficits. This is almost identical in nature to the pains he had from trigeminal neuralgia 2 years ago. At that time we tried Gabapentin but he had to stop this due to swelling. Eventually the pains at that time went away after about 6 weeks. He also asks me to check a rash on his back that appeared about a month ago. He has tried rubbing it with Selsun Blue shampoo with no results.    Review of Systems  Constitutional: Negative.   HENT: Negative.   Eyes: Negative.   Respiratory: Negative.   Cardiovascular: Negative.   Neurological: Positive for headaches. Negative for dizziness, seizures, facial asymmetry, speech difficulty, light-headedness and numbness.       Objective:   Physical Exam  Constitutional: He is oriented to person, place, and time. He appears well-developed and well-nourished. No distress.  HENT:  Head: Normocephalic and atraumatic.  Right Ear: External ear normal.  Left Ear: External ear normal.  Nose: Nose normal.  Mouth/Throat: Oropharynx is clear and moist.  Eyes: Pupils are equal, round, and reactive to light. Conjunctivae and EOM are normal.  Neck: Neck supple. No thyromegaly present.  Cardiovascular: Normal rate, regular rhythm, normal heart sounds and intact distal pulses.  Pulmonary/Chest: Effort normal and breath sounds normal.  Lymphadenopathy:    He has no cervical adenopathy.  Neurological: He is alert and oriented to person, place, and time. No cranial nerve deficit or sensory deficit. He exhibits normal muscle tone. Coordination normal.  Skin:  The upper back has patches of macular hypopigmented skin           Assessment & Plan:  He has a recurrence of trigeminal neuralgia,  and he will try a Medrol dose pack for this. Recheck prn. Also treat the tinea versicolor with Ketoconazole tablets bid.  Alysia Penna, MD

## 2017-11-04 ENCOUNTER — Encounter: Payer: PPO | Admitting: Family Medicine

## 2017-11-09 ENCOUNTER — Ambulatory Visit (INDEPENDENT_AMBULATORY_CARE_PROVIDER_SITE_OTHER): Payer: PPO | Admitting: Family Medicine

## 2017-11-09 ENCOUNTER — Encounter: Payer: Self-pay | Admitting: Family Medicine

## 2017-11-09 VITALS — BP 138/76 | HR 55 | Temp 98.4°F | Ht 69.5 in | Wt 196.4 lb

## 2017-11-09 DIAGNOSIS — Z0000 Encounter for general adult medical examination without abnormal findings: Secondary | ICD-10-CM

## 2017-11-09 LAB — POC URINALSYSI DIPSTICK (AUTOMATED)
BILIRUBIN UA: NEGATIVE
Glucose, UA: NEGATIVE
KETONES UA: NEGATIVE
LEUKOCYTES UA: NEGATIVE
NITRITE UA: NEGATIVE
Protein, UA: NEGATIVE
Spec Grav, UA: 1.01 (ref 1.010–1.025)
Urobilinogen, UA: 0.2 E.U./dL
pH, UA: 8 (ref 5.0–8.0)

## 2017-11-09 LAB — CBC WITH DIFFERENTIAL/PLATELET
Basophils Absolute: 0 10*3/uL (ref 0.0–0.1)
Basophils Relative: 0.9 % (ref 0.0–3.0)
EOS ABS: 0.1 10*3/uL (ref 0.0–0.7)
EOS PCT: 3.2 % (ref 0.0–5.0)
HCT: 44.5 % (ref 39.0–52.0)
Hemoglobin: 15.2 g/dL (ref 13.0–17.0)
LYMPHS PCT: 31.4 % (ref 12.0–46.0)
Lymphs Abs: 1.3 10*3/uL (ref 0.7–4.0)
MCHC: 34.2 g/dL (ref 30.0–36.0)
MCV: 87.4 fl (ref 78.0–100.0)
MONO ABS: 0.4 10*3/uL (ref 0.1–1.0)
MONOS PCT: 9.5 % (ref 3.0–12.0)
Neutro Abs: 2.3 10*3/uL (ref 1.4–7.7)
Neutrophils Relative %: 55 % (ref 43.0–77.0)
Platelets: 240 10*3/uL (ref 150.0–400.0)
RBC: 5.09 Mil/uL (ref 4.22–5.81)
RDW: 14 % (ref 11.5–15.5)
WBC: 4.2 10*3/uL (ref 4.0–10.5)

## 2017-11-09 LAB — BASIC METABOLIC PANEL
BUN: 16 mg/dL (ref 6–23)
CHLORIDE: 103 meq/L (ref 96–112)
CO2: 29 meq/L (ref 19–32)
Calcium: 9.7 mg/dL (ref 8.4–10.5)
Creatinine, Ser: 1.54 mg/dL — ABNORMAL HIGH (ref 0.40–1.50)
GFR: 47.84 mL/min — AB (ref >60.00)
GLUCOSE: 85 mg/dL (ref 70–99)
POTASSIUM: 5.1 meq/L (ref 3.5–5.1)
SODIUM: 141 meq/L (ref 135–145)

## 2017-11-09 LAB — LIPID PANEL
CHOLESTEROL: 180 mg/dL (ref 0–200)
HDL: 42.1 mg/dL (ref >39.00)
LDL Cholesterol: 103 mg/dL — ABNORMAL HIGH (ref 0–99)
NonHDL: 137.71
Total CHOL/HDL Ratio: 4
Triglycerides: 175 mg/dL — ABNORMAL HIGH (ref 0.0–149.0)
VLDL: 35 mg/dL (ref 0.0–40.0)

## 2017-11-09 LAB — PSA: PSA: 0.5 ng/mL (ref 0.10–4.00)

## 2017-11-09 LAB — HEPATIC FUNCTION PANEL
ALK PHOS: 42 U/L (ref 39–117)
ALT: 30 U/L (ref 0–53)
AST: 26 U/L (ref 0–37)
Albumin: 4.7 g/dL (ref 3.5–5.2)
BILIRUBIN DIRECT: 0.1 mg/dL (ref 0.0–0.3)
Total Bilirubin: 0.7 mg/dL (ref 0.2–1.2)
Total Protein: 7.1 g/dL (ref 6.0–8.3)

## 2017-11-09 LAB — TSH: TSH: 3.25 u[IU]/mL (ref 0.35–4.50)

## 2017-11-09 NOTE — Progress Notes (Signed)
   Subjective:    Patient ID: Steven [name], male    DOB: 1950-09-21, 67 y.o.   MRN: [id number]  HPI Here for a well exam. He has been doing well although he has had mild cold symptoms this week, with stuffy head and PND. No cough or fever. He feels much better today.    Review of Systems  Constitutional: Negative.   HENT: Negative.   Eyes: Negative.   Respiratory: Negative.   Cardiovascular: Negative.   Gastrointestinal: Negative.   Genitourinary: Negative.   Musculoskeletal: Negative.   Skin: Negative.   Neurological: Negative.   Psychiatric/Behavioral: Negative.        Objective:   Physical Exam  Constitutional: He is oriented to person, place, and time. He appears well-developed and well-nourished. No distress.  HENT:  Head: Normocephalic and atraumatic.  Right Ear: External ear normal.  Left Ear: External ear normal.  Nose: Nose normal.  Mouth/Throat: Oropharynx is clear and moist. No oropharyngeal exudate.  Eyes: Pupils are equal, round, and reactive to light. Conjunctivae and EOM are normal. Right eye exhibits no discharge. Left eye exhibits no discharge. No scleral icterus.  Neck: Neck supple. No JVD present. No tracheal deviation present. No thyromegaly present.  Cardiovascular: Normal rate, regular rhythm, normal heart sounds and intact distal pulses. Exam reveals no gallop and no friction rub.  No murmur heard. Pulmonary/Chest: Effort normal and breath sounds normal. No respiratory distress. He has no wheezes. He has no rales. He exhibits no tenderness.  Abdominal: Soft. Bowel sounds are normal. He exhibits no distension and no mass. There is no tenderness. There is no rebound and no guarding.  Genitourinary: Rectum normal, prostate normal and penis normal. Rectal exam shows guaiac negative stool. No penile tenderness.  Musculoskeletal: Normal range of motion. He exhibits no edema or tenderness.  Lymphadenopathy:    He has no cervical adenopathy.  Neurological: He  is alert and oriented to person, place, and time. He has normal reflexes. He displays normal reflexes. No cranial nerve deficit. He exhibits normal muscle tone. Coordination normal.  Skin: Skin is warm and dry. No rash noted. He is not diaphoretic. No erythema. No pallor.  Psychiatric: He has a normal mood and affect. His behavior is normal. Judgment and thought content normal.          Assessment & Plan:  Well exam. We discussed diet and exercise. Get fasting labs.  Alysia Penna, MD

## 2017-11-11 ENCOUNTER — Telehealth: Payer: Self-pay | Admitting: Family Medicine

## 2017-11-11 NOTE — Telephone Encounter (Signed)
Dr. Fry please advise. Thanks  

## 2017-11-11 NOTE — Telephone Encounter (Signed)
Copied from [place] [id number]. Topic: General - Other >> Nov 11, 2017  8:03 AM Lennox Solders wrote: Reason for CRM: pt wife beverly is calling and her husband saw dr fry on 11-09-17 and he is having sinus pressure and blowing out greenish mucous and headaches. [facility]. Pt wife is requesting something for sinus infection

## 2017-11-14 NOTE — Telephone Encounter (Signed)
I spoke to him today (he was with his wife who saw me) and sinus symptoms are much better, so he does not want an antibiotic after all

## 2017-12-21 ENCOUNTER — Encounter: Payer: Self-pay | Admitting: Family Medicine

## 2017-12-21 ENCOUNTER — Telehealth: Payer: Self-pay | Admitting: Family Medicine

## 2017-12-21 ENCOUNTER — Ambulatory Visit: Payer: Self-pay

## 2017-12-21 ENCOUNTER — Ambulatory Visit (INDEPENDENT_AMBULATORY_CARE_PROVIDER_SITE_OTHER): Payer: PPO | Admitting: Family Medicine

## 2017-12-21 VITALS — BP 140/76 | HR 77 | Temp 98.9°F | Wt 198.5 lb

## 2017-12-21 DIAGNOSIS — K6289 Other specified diseases of anus and rectum: Secondary | ICD-10-CM | POA: Diagnosis not present

## 2017-12-21 DIAGNOSIS — Z23 Encounter for immunization: Secondary | ICD-10-CM | POA: Diagnosis not present

## 2017-12-21 MED ORDER — HYDROCORTISONE ACE-PRAMOXINE 1-1 % RE FOAM: 1.0000 | Freq: Two times a day (BID) | RECTAL | 5 refills | Status: DC

## 2017-12-21 NOTE — Telephone Encounter (Signed)
See triage encounter.

## 2017-12-21 NOTE — Telephone Encounter (Signed)
Patient called in for a refill on proctoform hc for his hemorrhoids. I called and triaged the patient. He says "I feel soreness on the inside when I had a bowel movement a couple of days ago. I don't feel the hemorrhoids on the outside, but feel like they are on the inside. It's not pain, just uncomfortable feeling at a 3." I asked about itching, he denies itching. I asked about other symptoms, he says "when I wiped on Monday, bright red blood was on the tissue. I didn't look in the toilet." According to protocol, see PCP within 2 weeks, appointment scheduled for today at 1615, care advice given, patient verbalized understanding.   Reason for Disposition . [1] Recurrent episodes of unexplained rectal pain AND [2] NO rectal symptoms now  Answer Assessment - Initial Assessment Questions 1. SYMPTOM:  "What's the main symptom you're concerned about?" (e.g., pain, itching, swelling, rash)     Feel soreness on the inside 2. ONSET: "When did the symptoms start?"     A couple of days ago 3. RECTAL PAIN: "Do you have any pain around your rectum?" "How bad is the pain?"  (Scale 1-10; or mild, moderate, severe)  - MILD (1-3): doesn't interfere with normal activities   - MODERATE (4-7): interferes with normal activities or awakens from sleep, limping   - SEVERE (8-10): excruciating pain, unable to have a bowel movement      Uncomfortable, 3 4. RECTAL ITCHING: "Do you have any itching in this area?" "How bad is the itching?"  (Scale 1-10; or mild, moderate, severe)  - MILD - doesn't interfere with normal activities   - MODERATE - SEVERE: interferes with normal activities or awakens from sleep     No itching 5. CONSTIPATION: "Do you have constipation?" If so, "How bad is it?"     No 6. CAUSE: "What do you think is causing the anus symptoms?"     Hemorroids 7. OTHER SYMPTOMS: "Do you have any other symptoms?"  (e.g., rectal bleeding, abdominal pain, vomiting, fever)     Bright red blood once on Monday on  tissue 8. PREGNANCY: "Is there any chance you are pregnant?" "When was your last menstrual period?"     N/A  Protocols used: RECTAL [facility]

## 2017-12-21 NOTE — Telephone Encounter (Signed)
Copied from [place] [phone]. Topic: Quick Communication - See Telephone Encounter >> Dec 21, 2017  9:08 AM Hewitt Shorts wrote: Pt is looking to get a refill on proctoform hc for his hemorrids   Best number [facility]

## 2017-12-21 NOTE — Progress Notes (Signed)
   Subjective:    Patient ID: Steven [name], male    DOB: December 27, 1950, 67 y.o.   MRN: [id number]  HPI Here for several days of intermittent pains in the rectum and he has had bright red blood on the tissue when he wipes twice in the past week. His stools are not hard or difficult to pass. Preparation H has not helped. He has had good results in the past with Proctofoam and he asks to try this again.    Review of Systems  Constitutional: Negative.   Respiratory: Negative.   Cardiovascular: Negative.   Gastrointestinal: Positive for anal bleeding. Negative for abdominal distention, abdominal pain, blood in stool, constipation, diarrhea, nausea and rectal pain.       Objective:   Physical Exam  Constitutional: He appears well-developed and well-nourished.  Cardiovascular: Normal rate, regular rhythm, normal heart sounds and intact distal pulses.  Pulmonary/Chest: Effort normal and breath sounds normal.  Abdominal: Soft. Bowel sounds are normal. He exhibits no distension and no mass. There is no tenderness. There is no guarding.  Genitourinary: Rectum normal.  Genitourinary Comments: No hemorrhoids or fissures seen           Assessment & Plan:  Rectal bleeding and irritation, likely from internal hemorrhoids. Try Proctofoam HC bid.  Alysia Penna, MD

## 2017-12-21 NOTE — Telephone Encounter (Signed)
Noted will send to nurse as Juluis Rainier

## 2017-12-23 ENCOUNTER — Telehealth: Payer: Self-pay

## 2017-12-23 NOTE — Telephone Encounter (Signed)
Received a fax from [facility] stating that Proctofoam has been rejected.  Fax given to Dr. Barbie Banner assistant.

## 2017-12-23 NOTE — Telephone Encounter (Signed)
Dr. Sarajane Jews please advise on the change of the medication.  thanks

## 2017-12-23 NOTE — Telephone Encounter (Signed)
Please start a Prior Auth for this

## 2017-12-27 NOTE — Telephone Encounter (Signed)
I do not dee this medication on the patients med list. Please advise.

## 2017-12-27 NOTE — Telephone Encounter (Signed)
This is the hydrocortisone-pramoxine.

## 2017-12-27 NOTE — Telephone Encounter (Signed)
PA has been faxed to [place] [facility].

## 2018-01-24 DIAGNOSIS — H52223 Regular astigmatism, bilateral: Secondary | ICD-10-CM | POA: Diagnosis not present

## 2018-01-24 DIAGNOSIS — H524 Presbyopia: Secondary | ICD-10-CM | POA: Diagnosis not present

## 2018-01-24 DIAGNOSIS — H5203 Hypermetropia, bilateral: Secondary | ICD-10-CM | POA: Diagnosis not present

## 2018-01-24 DIAGNOSIS — H2513 Age-related nuclear cataract, bilateral: Secondary | ICD-10-CM | POA: Diagnosis not present

## 2018-01-24 DIAGNOSIS — H43813 Vitreous degeneration, bilateral: Secondary | ICD-10-CM | POA: Diagnosis not present

## 2018-01-24 DIAGNOSIS — H40013 Open angle with borderline findings, low risk, bilateral: Secondary | ICD-10-CM | POA: Diagnosis not present

## 2018-01-24 DIAGNOSIS — H11152 Pinguecula, left eye: Secondary | ICD-10-CM | POA: Diagnosis not present

## 2018-01-25 ENCOUNTER — Other Ambulatory Visit: Payer: Self-pay | Admitting: Family Medicine

## 2018-01-25 MED ORDER — RIVAROXABAN 20 MG PO TABS: 20.0000 mg | ORAL_TABLET | Freq: Every day | ORAL | 1 refills | Status: DC

## 2018-01-25 MED ORDER — RABEPRAZOLE SODIUM 20 MG PO TBEC: 20.0000 mg | DELAYED_RELEASE_TABLET | Freq: Two times a day (BID) | ORAL | 3 refills | Status: DC

## 2018-01-25 MED ORDER — LOSARTAN POTASSIUM 50 MG PO TABS: 50.0000 mg | ORAL_TABLET | Freq: Every day | ORAL | 1 refills | Status: DC

## 2018-01-25 MED ORDER — FENOFIBRATE 145 MG PO TABS: 145.0000 mg | ORAL_TABLET | Freq: Every evening | ORAL | 1 refills | Status: DC

## 2018-01-25 NOTE — Telephone Encounter (Signed)
CPE- 11/09/17- labs reviewed by provider and patient to continue medications Requested Prescriptions  Pending Prescriptions Disp Refills  . fenofibrate (TRICOR) 145 MG tablet 90 tablet 1    Sig: Take 1 tablet (145 mg total) by mouth every evening.     Cardiovascular:  Antilipid - Fibric Acid Derivatives Failed - 01/25/2018  8:29 AM      Failed - LDL in normal range and within 360 days    LDL Cholesterol  Date Value Ref Range Status  11/09/2017 103 (H) 0 - 99 mg/dL Final         Failed - Triglycerides in normal range and within 360 days    Triglycerides  Date Value Ref Range Status  11/09/2017 175.0 (H) 0.0 - 149.0 mg/dL Final    Comment:    Normal:  <150 mg/dLBorderline High:  150 - 199 mg/dL         Failed - Cr in normal range and within 180 days    Creatinine, Ser  Date Value Ref Range Status  11/09/2017 1.54 (H) 0.40 - 1.50 mg/dL Final         Failed - eGFR in normal range and within 180 days    GFR calc Af Amer  Date Value Ref Range Status  07/20/2016 57 (L) >60 mL/min Final    Comment:    (NOTE) The eGFR has been calculated using the CKD EPI equation. This calculation has not been validated in all clinical situations. eGFR's persistently <60 mL/min signify possible Chronic Kidney Disease.    GFR calc non Af Amer  Date Value Ref Range Status  07/20/2016 49 (L) >60 mL/min Final   GFR  Date Value Ref Range Status  11/09/2017 47.84 (L) >60.00 mL/min Final         Passed - Total Cholesterol in normal range and within 360 days    Cholesterol  Date Value Ref Range Status  11/09/2017 180 0 - 200 mg/dL Final    Comment:    ATP III Classification       Desirable:  < 200 mg/dL               Borderline High:  200 - 239 mg/dL          High:  > = 240 mg/dL         Passed - HDL in normal range and within 360 days    HDL  Date Value Ref Range Status  11/09/2017 42.10 >39.00 mg/dL Final         Passed - ALT in normal range and within 180 days    ALT  Date Value Ref  Range Status  11/09/2017 30 0 - 53 U/L Final         Passed - AST in normal range and within 180 days    AST  Date Value Ref Range Status  11/09/2017 26 0 - 37 U/L Final         Passed - Valid encounter within last 12 months    Recent Outpatient Visits          1 month ago Rectal pain   [place] at [place], [place]   2 months ago Preventative health care   [facility] at [place], [place]   4 months ago Trigeminal neuralgia of right side of face   [place] [facility] [place], [place]   5 months ago Lower abdominal pain   Therap[facility], music at [employer],  Steele Berg, [place]   10 months ago Other acute sinusitis, recurrence not specified   Therap[facility] at [place], [place]      Future Appointments            In 3 months [place] [facility] [place], [place]          . losartan (COZAAR) 50 MG tablet 90 tablet 1    Sig: Take 1 tablet (50 mg total) by mouth daily.     Cardiovascular:  Angiotensin Receptor Blockers Failed - 01/25/2018  8:29 AM      Failed - Cr in normal range and within 180 days    Creatinine, Ser  Date Value Ref Range Status  11/09/2017 1.54 (H) 0.40 - 1.50 mg/dL Final         Failed - Last BP in normal range    BP Readings from Last 1 Encounters:  12/21/17 140/76         Passed - K in normal range and within 180 days    Potassium  Date Value Ref Range Status  11/09/2017 5.1 3.5 - 5.1 mEq/L Final         Passed - Patient is not pregnant      Passed - Valid encounter within last 6 months    Recent Outpatient Visits          1 month ago Rectal pain   [place] at [place], [place]   2 months ago Preventative health care   [facility] at [place], [place]   4 months 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 rivaroxaban (XARELTO) 20 MG TABS tablet 90 tablet 1    Sig: Take 1 tablet (20 mg total) by mouth daily with supper.     Hematology: Anticoagulants - rivaroxaban Failed - 01/25/2018  8:29 AM      Failed - Cr in normal range and within 360 days    Creatinine, Ser  Date Value Ref Range Status  11/09/2017 1.54 (H) 0.40 - 1.50 mg/dL Final         Passed - ALT in normal range and within 180 days    ALT  Date Value Ref Range Status  11/09/2017 30 0 - 53 U/L Final         Passed - AST in normal range and within 180 days    AST  Date Value Ref Range Status  11/09/2017 26 0 - 37 U/L Final         Passed - HCT in normal range and within 360 days    HCT  Date Value Ref Range Status  11/09/2017 44.5 39.0 - 52.0 % Final  12/19/2008 42.8 38.7 - 49.9 % Final  05/08/2007 41.2 38.7 - 49.9 % Final         Passed - HGB in normal range and within 360 days    Hemoglobin  Date Value Ref Range Status  11/09/2017 15.2 13.0 - 17.0 g/dL Final  HGB  Date Value Ref Range Status  12/19/2008 14.8 13.0 - 17.1 g/dL Final  05/08/2007 14.4 13.0 - 17.1 g/dL Final         Passed - PLT in normal range and within 360 days    Platelets  Date Value Ref Range Status  11/09/2017 240.0 150.0 - 400.0 K/uL Final  12/19/2008 268 145 - 400 10e3/uL Final  05/08/2007 233 145 - 400 10e3/uL Final         Passed - Valid encounter within last 12 months    Recent Outpatient Visits          1 month ago Rectal pain   [place] at [place], [place]   2 months ago Preventative health care   [place] at [place], [place]   4 months ago Trigeminal neuralgia of right side of face   [place] at [place], [place]   5 months ago Lower abdominal pain   [place] at [facility] [facility], [place], MD   10 months ago Other acute sinusitis, recurrence not specified   Therapist, music at [place], [place]      Future Appointments            In 3 months [facility] at [place], [facility]          . RABEprazole (ACIPHEX) 20 MG tablet 180 tablet 3    Sig: Take 1 tablet (20 mg total) by mouth 2 (two) times daily.     Gastroenterology: Proton Pump Inhibitors Passed - 01/25/2018  8:29 AM      Passed - Valid encounter within last 12 months    Recent Outpatient Visits          1 month ago Rectal pain   [place] at [place], [place]   2 months ago Preventative health care   [place] [facility] [place], [place]   4 months ago Trigeminal neuralgia of right side of face   [place] at [place], [place]   5 months ago Lower abdominal pain   [place] at [facility], Steele Berg, MD   10 months ago Other acute sinusitis, recurrence not specified   Therapist, music at [place], MD      Future Appointments            In 3 months [facility] at [place], [facility]

## 2018-01-25 NOTE — Telephone Encounter (Signed)
Copied from [place] [id number]. Topic: Quick Communication - Rx Refill/Question >> Jan 25, 2018  8:17 AM Scherrie Gerlach wrote: Medication: RABEprazole (ACIPHEX) 20 MG tablet losartan (COZAAR) 50 MG tablet rivaroxaban (XARELTO) 20 MG TABS tablet fenofibrate (TRICOR) 145 MG tablet  Pt had cpe on 11/09/17 and pt thought these were refilled.  But now he is out.  Pt requesting refills sent to [facility] [place], [place] [phone] (Phone) [phone] (Fax)  Pt would like enough refills to get though to his next cpe which is in October 2020

## 2018-01-27 NOTE — Telephone Encounter (Signed)
PA has been approved

## 2018-02-28 ENCOUNTER — Other Ambulatory Visit: Payer: Self-pay | Admitting: Family Medicine

## 2018-02-28 MED ORDER — EZETIMIBE 10 MG PO TABS: 10.0000 mg | ORAL_TABLET | Freq: Every day | ORAL | 3 refills | Status: DC

## 2018-03-08 DIAGNOSIS — M19041 Primary osteoarthritis, right hand: Secondary | ICD-10-CM | POA: Diagnosis not present

## 2018-03-08 DIAGNOSIS — M1812 Unilateral primary osteoarthritis of first carpometacarpal joint, left hand: Secondary | ICD-10-CM | POA: Diagnosis not present

## 2018-03-08 DIAGNOSIS — M79644 Pain in right finger(s): Secondary | ICD-10-CM | POA: Diagnosis not present

## 2018-03-08 DIAGNOSIS — M1811 Unilateral primary osteoarthritis of first carpometacarpal joint, right hand: Secondary | ICD-10-CM | POA: Diagnosis not present

## 2018-05-01 ENCOUNTER — Other Ambulatory Visit: Payer: Self-pay | Admitting: Family Medicine

## 2018-05-01 NOTE — Telephone Encounter (Signed)
Please advise on below  

## 2018-05-03 MED ORDER — FIRST-DUKES MOUTHWASH MT SUSP: 5.0000 mL | Freq: Four times a day (QID) | OROMUCOSAL | 1 refills | Status: DC | PRN

## 2018-05-03 NOTE — Telephone Encounter (Signed)
Dr. Fry please advise on refill. Thanks  

## 2018-05-09 NOTE — Telephone Encounter (Signed)
Pt's wife calling to check on this.  Still haven't be able to get from pharmacy.

## 2018-05-09 NOTE — Telephone Encounter (Signed)
Pt's wife calling to check on status

## 2018-05-09 NOTE — Telephone Encounter (Signed)
Pt wife stated mouthwash was sent to wrong pharmacy and her local pharmacy has to fill the Rx. Please send Diphenhyd-Hydrocort-Nystatin (FIRST-DUKES MOUTHWASH) SUSP To  [place], [place] - [place] [phone] (Phone) [phone] (Fax)

## 2018-05-09 NOTE — Telephone Encounter (Signed)
Medication was sent to the wrong pharmacy.

## 2018-05-17 ENCOUNTER — Encounter: Payer: Self-pay | Admitting: Family Medicine

## 2018-05-17 ENCOUNTER — Other Ambulatory Visit: Payer: Self-pay | Admitting: Family Medicine

## 2018-05-17 MED ORDER — FIRST-DUKES MOUTHWASH MT SUSP: 5.0000 mL | Freq: Four times a day (QID) | OROMUCOSAL | 1 refills | Status: DC | PRN

## 2018-05-18 ENCOUNTER — Other Ambulatory Visit: Payer: Self-pay | Admitting: Family Medicine

## 2018-05-18 MED ORDER — LOSARTAN POTASSIUM 50 MG PO TABS: 50.0000 mg | ORAL_TABLET | Freq: Every day | ORAL | 1 refills | Status: DC

## 2018-05-18 MED ORDER — RIVAROXABAN 20 MG PO TABS: 20.0000 mg | ORAL_TABLET | Freq: Every day | ORAL | 1 refills | Status: DC

## 2018-05-18 MED ORDER — FENOFIBRATE 145 MG PO TABS: 145.0000 mg | ORAL_TABLET | Freq: Every evening | ORAL | 1 refills | Status: DC

## 2018-05-23 ENCOUNTER — Ambulatory Visit: Payer: PPO

## 2018-05-31 DIAGNOSIS — M1811 Unilateral primary osteoarthritis of first carpometacarpal joint, right hand: Secondary | ICD-10-CM | POA: Diagnosis not present

## 2018-05-31 DIAGNOSIS — M19041 Primary osteoarthritis, right hand: Secondary | ICD-10-CM | POA: Diagnosis not present

## 2018-05-31 DIAGNOSIS — R2232 Localized swelling, mass and lump, left upper limb: Secondary | ICD-10-CM | POA: Diagnosis not present

## 2018-05-31 DIAGNOSIS — M1812 Unilateral primary osteoarthritis of first carpometacarpal joint, left hand: Secondary | ICD-10-CM | POA: Diagnosis not present

## 2018-05-31 DIAGNOSIS — M79644 Pain in right finger(s): Secondary | ICD-10-CM | POA: Diagnosis not present

## 2018-05-31 DIAGNOSIS — M180 Bilateral primary osteoarthritis of first carpometacarpal joints: Secondary | ICD-10-CM | POA: Diagnosis not present

## 2018-07-27 ENCOUNTER — Ambulatory Visit: Payer: PPO

## 2018-08-11 DIAGNOSIS — H11042 Peripheral pterygium, stationary, left eye: Secondary | ICD-10-CM | POA: Diagnosis not present

## 2018-08-11 DIAGNOSIS — H43813 Vitreous degeneration, bilateral: Secondary | ICD-10-CM | POA: Diagnosis not present

## 2018-08-11 DIAGNOSIS — H2513 Age-related nuclear cataract, bilateral: Secondary | ICD-10-CM | POA: Diagnosis not present

## 2018-08-11 DIAGNOSIS — H40013 Open angle with borderline findings, low risk, bilateral: Secondary | ICD-10-CM | POA: Diagnosis not present

## 2018-10-10 ENCOUNTER — Encounter: Payer: Self-pay | Admitting: Gastroenterology

## 2018-10-10 ENCOUNTER — Ambulatory Visit (INDEPENDENT_AMBULATORY_CARE_PROVIDER_SITE_OTHER): Payer: PPO | Admitting: Gastroenterology

## 2018-10-10 VITALS — BP 152/88 | HR 56 | Temp 97.8°F | Ht 70.5 in | Wt 197.4 lb

## 2018-10-10 DIAGNOSIS — R1310 Dysphagia, unspecified: Secondary | ICD-10-CM | POA: Diagnosis not present

## 2018-10-10 NOTE — Patient Instructions (Signed)
You have been scheduled for an endoscopy. Please follow written instructions given to you at your visit today. If you use inhalers (even only as needed), please bring them with you on the day of your procedure. Your physician has requested that you go to www.[email or web address].com and enter the access code given to you at your visit today. This web site gives a general overview about your procedure. However, you should still follow specific instructions given to you by our office regarding your preparation for the procedure.  Thank you for entrusting me with your care and choosing [facility].  Dr Ardis Hughs

## 2018-10-10 NOTE — Progress Notes (Signed)
Review of pertinent gastrointestinal problems:  1. family history of colon cancer (father). Colonoscopy February 2012 found hyperplastic polyp, recall colonoscopy at 5 year interval.   Colonoscopy June 2017 found a single subcentimeter polyp.  This was removed but not retrieved.  Also left colon diverticulosis.  He was recommended to have repeat colonoscopy at 5 years 2. ? Esophageal dysmotility, achalasia?. Botox injection by previous gastroenterologist [place]. EGD February 2012 suggested a snug E. junction, esophageal manometry March, 2012 showed normal peristalsis but high residual lower esophageal sphincter pressure. Previoulsy was having dysphagia (around 2000 or so). B[facility] injection by [facility] helped, but caused worse reflux problems. 04/2012 EGD Ardis Hughs with botox injection again helped his symptoms.  High Res esophageal manometry 02/2013 was essentially normal; no sign of achalasia.   HPI: This is a very pleasant 68 year old man whom I last saw the time of a colonoscopy about 3 years ago.  He is here today to discuss his chronic dysphasia.  Seems to be a bit worse lately.  This is to solids and liquids.  He is not losing weight.  He does have heartburn which is well controlled on 20 mg AcipHex which he takes once or twice daily.  He was started on a blood thinner Xarelto about 2 years ago for PE, DVT.  He is very nervous about coming off of that medicine.  Chief complaint is dysphasia  ROS: complete GI ROS as described in HPI, all other review negative.  Constitutional:  No unintentional weight loss   Past Medical History:  Diagnosis Date  . Clotting disorder ([place])   . Displacement of intervertebral disc, site unspecified, without myelopathy   . Esophageal reflux   . Headache(784.0)    sinus and migraines  . Herpes simplex without mention of complication   . Hiatal hernia   . HTN (hypertension) 05/10/2012  . Hyperlipemia   . Hypertrophy (benign) of prostate   . Intestinal infection  due to other organism, not elsewhere classified   . Kidney stones    sees Dr. Junious Silk  . Personal history of colonic polyps   . Personal history of urinary calculi   . Personal history of venous thrombosis and embolism   . Renal insufficiency     Past Surgical History:  Procedure Laterality Date  . APPENDECTOMY    . BOTOX INJECTION N/A 04/13/2012   Procedure: BOTOX INJECTION;  Surgeon: Milus Banister, MD;  Location: [facility];  Service: Endoscopy;  Laterality: N/A;  . [place]  . CHOLECYSTECTOMY    . COLONOSCOPY  07/03/2015   per Dr. Ardis Hughs, single polyp removed but not retrieved,  repeat in 5 yrs   . ESOPHAGEAL MANOMETRY N/A 02/26/2013   Procedure: ESOPHAGEAL MANOMETRY (EM);  Surgeon: Milus Banister, MD;  Location: [facility];  Service: Endoscopy;  Laterality: N/A;  . ESOPHAGOGASTRODUODENOSCOPY  02-24-10   per Dr. Ardis Hughs, small hiatal hernia only   . ESOPHAGOGASTRODUODENOSCOPY (EGD) WITH ESOPHAGEAL DILATION N/A 04/13/2012   Procedure: ESOPHAGOGASTRODUODENOSCOPY (EGD) WITH ESOPHAGEAL DILATION;  Surgeon: Milus Banister, MD;  Location: [facility];  Service: Endoscopy;  Laterality: N/A;  possible botox injection  . FINGER SURGERY     3rd finger trigger bilateral   . GALLBLADDER SURGERY    . LITHOTRIPSY    . ROTATOR CUFF REPAIR Right 01-10-12   per Dr. Esmond Plants, also repair torn biceps tendon   . STERIOD INJECTION Left 07/27/2013   Procedure: STEROID INJECTION;  Surgeon: Augustin Schooling, MD;  Location: [place];  Service: Orthopedics;  Laterality: Left;  . TONSILLECTOMY    . TRIGGER FINGER RELEASE Right 07/27/2013   Procedure: RIGHT RING FINGER A-1 RELEASE;  Surgeon: Augustin Schooling, MD;  Location: [place];  Service: Orthopedics;  Laterality: Right;  . URETERAL STENT PLACEMENT  July 2012   left ureter, per Dr. Junious Silk, for a stone     Current Outpatient Medications  Medication Sig Dispense Refill  . acyclovir cream (ZOVIRAX) 5 % Apply 1 application  topically daily as needed (fever blisters). 15 g 3  . clobetasol cream (TEMOVATE) [id number] % Apply 1 application topically daily as needed (herpes).     . Diphenhyd-Hydrocort-Nystatin (FIRST-DUKES MOUTHWASH) SUSP Use as directed 5 mLs in the mouth or throat every 6 (six) hours as needed (ulcers). Swish and spit 237 mL 1  . eletriptan (RELPAX) 20 MG tablet Take 1 tablet (20 mg total) by mouth as needed for migraine. 90 tablet 3  . ezetimibe (ZETIA) 10 MG tablet Take 1 tablet (10 mg total) by mouth daily. 90 tablet 3  . fenofibrate (TRICOR) 145 MG tablet Take 1 tablet (145 mg total) by mouth every evening. 90 tablet 1  . folic acid (FOLVITE) 1 MG tablet Take 1 mg by mouth daily.    . hydrocortisone-pramoxine (PROCTOFOAM HC) rectal foam Place 1 applicator rectally 2 (two) times daily. 10 g 5  . losartan (COZAAR) 50 MG tablet Take 1 tablet (50 mg total) by mouth daily. 90 tablet 1  . RABEprazole (ACIPHEX) 20 MG tablet Take 1 tablet (20 mg total) by mouth 2 (two) times daily. 180 tablet 3  . rivaroxaban (XARELTO) 20 MG TABS tablet Take 1 tablet (20 mg total) by mouth daily with supper. 90 tablet 1  . temazepam (RESTORIL) 30 MG capsule Take 1 capsule (30 mg total) by mouth at bedtime as needed for sleep. 90 capsule 1   No current facility-administered medications for this visit.     Allergies as of 10/10/2018 - Review Complete 10/10/2018  Allergen Reaction Noted  . Morphine Other (See Comments)   . Amitriptyline hcl Other (See Comments)   . Amoxicillin-pot clavulanate  01/18/2007  . Cyclobenzaprine hcl  07/31/2008  . Levofloxacin Swelling 04/16/2016  . Lisinopril-hydrochlorothiazide  05/24/2012  . Prednisone Other (See Comments)     Family History  Problem Relation Age of Onset  . Colon cancer Father   . Heart attack Father   . Heart disease Brother   . Heart disease Brother     Social History   Socioeconomic History  . Marital status: Married    Spouse name: Not on file  . Number of  children: Not on file  . Years of education: Not on file  . Highest education level: Not on file  Occupational History  . Occupation: Retired  Scientific laboratory technician  . Financial resource strain: Not on file  . Food insecurity    Worry: Not on file    Inability: Not on file  . Transportation needs    Medical: Not on file    Non-medical: Not on file  Tobacco Use  . Smoking status: Former Smoker    Packs/day: 1.50    Years: 20.00    Pack years: 30.00    Types: Cigarettes    Quit date: 03/03/1973    Years since quitting: 45.6  . Smokeless tobacco: Never Used  . Tobacco comment: discussed AAA; did have CT of abd/ pelvis   Substance and Sexual Activity  . Alcohol use:  No    Alcohol/week: 0.0 standard drinks  . Drug use: No  . Sexual activity: Not on file  Lifestyle  . Physical activity    Days per week: Not on file    Minutes per session: Not on file  . Stress: Not on file  Relationships  . Social Herbalist on phone: Not on file    Gets together: Not on file    Attends religious service: Not on file    Active member of club or organization: Not on file    Attends meetings of clubs or organizations: Not on file    Relationship status: Not on file  . Intimate partner violence    Fear of current or ex partner: Not on file    Emotionally abused: Not on file    Physically abused: Not on file    Forced sexual activity: Not on file  Other Topics Concern  . Not on file  Social History Narrative  . Not on file     Physical Exam: BP (!) 152/88 (BP Location: Left Arm, Patient Position: Sitting, Cuff Size: Normal)   Pulse (!) 56   Temp 97.8 F (36.6 C) (Oral)   Ht 5' 10.5" (1.791 m)   Wt 197 lb 6 oz (89.5 kg)   BMI 27.92 kg/m  Constitutional: generally well-appearing Psychiatric: alert and oriented x3 Abdomen: soft, nontender, nondistended, no obvious ascites, no peritoneal signs, normal bowel sounds No peripheral edema noted in lower extremities  Assessment and  plan: 68 y.o. male with solid, liquid dysphasia  Think it is very possible that he actually has achalasia.  He has responded twice to Botox injections many years ago, before he established care here.  2015 manometry testing however did not prove achalasia, in fact it was pretty normal.  Today he is back with solid and liquid food dysphasia.  Seems to be getting worse lately.  Fortunately he is not losing weight.  He has heartburn which is well controlled.  First I recommended we repeat work-up with esophageal manometry.  He understands that he would also need an upper endoscopy almost certainly at least to exclude infection or neoplasm.  He is extremely nervous about holding his Xarelto even for just 2 or 3 days.  I completely understand that.  He eventually agreed to diagnostic EGD only as the first step.  I explained to him that the least we should be doing for him right now is improving whether or not he has an underlying cancer or significant infection in his esophagus that could be causing these symptoms.  I told him I think it is unlikely.  Diagnostic EGD would be safe to do while he stays on Xarelto.  I would not be planning to do dilation.  It would be safe to biopsy the esophagus if that is necessary.  Please see the "Patient Instructions" section for addition details about the plan.  Owens Loffler, MD [facility] 10/10/2018, 9:00 AM

## 2018-10-11 DIAGNOSIS — M1812 Unilateral primary osteoarthritis of first carpometacarpal joint, left hand: Secondary | ICD-10-CM | POA: Diagnosis not present

## 2018-10-11 DIAGNOSIS — M1811 Unilateral primary osteoarthritis of first carpometacarpal joint, right hand: Secondary | ICD-10-CM | POA: Diagnosis not present

## 2018-10-11 DIAGNOSIS — R2232 Localized swelling, mass and lump, left upper limb: Secondary | ICD-10-CM | POA: Diagnosis not present

## 2018-10-11 DIAGNOSIS — M19041 Primary osteoarthritis, right hand: Secondary | ICD-10-CM | POA: Diagnosis not present

## 2018-10-11 DIAGNOSIS — M79644 Pain in right finger(s): Secondary | ICD-10-CM | POA: Diagnosis not present

## 2018-10-16 ENCOUNTER — Telehealth: Payer: Self-pay | Admitting: Gastroenterology

## 2018-10-16 NOTE — Telephone Encounter (Signed)

## 2018-10-17 ENCOUNTER — Ambulatory Visit (AMBULATORY_SURGERY_CENTER): Payer: PPO | Admitting: Gastroenterology

## 2018-10-17 ENCOUNTER — Encounter: Payer: Self-pay | Admitting: Gastroenterology

## 2018-10-17 ENCOUNTER — Other Ambulatory Visit: Payer: Self-pay

## 2018-10-17 VITALS — BP 136/75 | HR 56 | Temp 98.7°F | Resp 18 | Ht 70.5 in | Wt 197.0 lb

## 2018-10-17 DIAGNOSIS — R1310 Dysphagia, unspecified: Secondary | ICD-10-CM

## 2018-10-17 DIAGNOSIS — K219 Gastro-esophageal reflux disease without esophagitis: Secondary | ICD-10-CM | POA: Diagnosis not present

## 2018-10-17 DIAGNOSIS — K449 Diaphragmatic hernia without obstruction or gangrene: Secondary | ICD-10-CM

## 2018-10-17 DIAGNOSIS — I10 Essential (primary) hypertension: Secondary | ICD-10-CM | POA: Diagnosis not present

## 2018-10-17 DIAGNOSIS — E669 Obesity, unspecified: Secondary | ICD-10-CM | POA: Diagnosis not present

## 2018-10-17 HISTORY — PX: ESOPHAGOGASTRODUODENOSCOPY: SHX1529

## 2018-10-17 MED ORDER — SODIUM CHLORIDE 0.9 % IV SOLN: 500.0000 mL | Freq: Once | INTRAVENOUS | Status: DC

## 2018-10-17 NOTE — Progress Notes (Signed)
Pt's states no medical or surgical changes since previsit or office visit.  JB - temp CW - vitals. 

## 2018-10-17 NOTE — Op Note (Signed)
[name] Patient Name: Steven [name] Procedure Date: 10/17/2018 10:58 AM MRN: [id number] Endoscopist: Milus Banister , MD Age: 68 Referring MD:  Date of Birth: [birth year] Gender: Male Account #: [id number] Procedure:                Upper GI endoscopy Indications:              Dysphagia; ? Esophageal dysmotility, achalasia?.                            Botox injection by previous gastroenterologist                            [place]. EGD February 2012 suggested a snug E.                            junction, esophageal manometry March, 2012 showed                            normal peristalsis but high residual lower                            esophageal sphincter pressure. Previoulsy was                            having dysphagia (around 2000 or so). Botox                            injection by [facility] helped, but caused worse reflux                            problems. 04/2012 EGD Ardis Hughs with botox injection                            again helped his symptoms. High Res esophageal                            manometry 02/2013 was essentially normal; no sign of                            achalasia Medicines:                Monitored Anesthesia Care Procedure:                Pre-Anesthesia Assessment:                           - Prior to the procedure, a History and Physical                            was performed, and patient medications and                            allergies were reviewed. The patient's tolerance of  previous anesthesia was also reviewed. The risks                            and benefits of the procedure and the sedation                            options and risks were discussed with the patient.                            All questions were answered, and informed consent                            was obtained. Prior Anticoagulants: The patient has                            taken Xarelto (rivaroxaban), last dose was 1 day                        prior to procedure. ASA Grade Assessment: III - A                            patient with severe systemic disease. After                            reviewing the risks and benefits, the patient was                            deemed in satisfactory condition to undergo the                            procedure.                           After obtaining informed consent, the endoscope was                            passed under direct vision. Throughout the                            procedure, the patient's blood pressure, pulse, and                            oxygen saturations were monitored continuously. The                            Endoscope was introduced through the mouth, and                            advanced to the second part of duodenum. The upper                            GI endoscopy was accomplished without difficulty.  The patient tolerated the procedure well. Scope In: Scope Out: Findings:                 A small hiatal hernia was present.                           The exam was otherwise without abnormality. GE                            junction was normal without stenosis or stricture. Complications:            No immediate complications. Estimated blood loss:                            None. Estimated Blood Loss:     Estimated blood loss: none. Impression:               - Small hiatal hernia.                           - The examination was otherwise normal. Recommendation:           - Patient has a contact number available for                            emergencies. The signs and symptoms of potential                            delayed complications were discussed with the                            patient. Return to normal activities tomorrow.                            Written discharge instructions were provided to the                            patient.                           - Resume previous diet.                            - Continue present medications.                           - Will consider repeat hi resolution esophageal                            manometry. Milus Banister, MD 10/17/2018 11:13:53 AM This report has been signed electronically.

## 2018-10-17 NOTE — Patient Instructions (Signed)
   RESUME PREVIOUS DIET & MEDICATIONS    YOU HAD AN ENDOSCOPIC PROCEDURE TODAY AT [facility]:   Refer to the procedure report that was given to you for any specific questions about what was found during the examination.  If the procedure report does not answer your questions, please call your gastroenterologist to clarify.  If you requested that your care partner not be given the details of your procedure findings, then the procedure report has been included in a sealed envelope for you to review at your convenience later.  YOU SHOULD EXPECT: Some feelings of bloating in the abdomen. Passage of more gas than usual.  Walking can help get rid of the air that was put into your GI tract during the procedure and reduce the bloating. If you had a lower endoscopy (such as a colonoscopy or flexible sigmoidoscopy) you may notice spotting of blood in your stool or on the toilet paper. If you underwent a bowel prep for your procedure, you may not have a normal bowel movement for a few days.  Please Note:  You might notice some irritation and congestion in your nose or some drainage.  This is from the oxygen used during your procedure.  There is no need for concern and it should clear up in a day or so.  SYMPTOMS TO REPORT IMMEDIATELY:     Following upper endoscopy (EGD)  Vomiting of blood or coffee ground material  New chest pain or pain under the shoulder blades  Painful or persistently difficult swallowing  New shortness of breath  Fever of 100F or higher  Black, tarry-looking stools  For urgent or emergent issues, a gastroenterologist can be reached at any hour by calling [phone].   DIET:  We do recommend a small meal at first, but then you may proceed to your regular diet.  Drink plenty of fluids but you should avoid alcoholic beverages for 24 hours.  ACTIVITY:  You should plan to take it easy for the rest of today and you should NOT DRIVE or use heavy machinery until  tomorrow (because of the sedation medicines used during the test).    FOLLOW UP: Our staff will call the number listed on your records 48-72 hours following your procedure to check on you and address any questions or concerns that you may have regarding the information given to you following your procedure. If we do not reach you, we will leave a message.  We will attempt to reach you two times.  During this call, we will ask if you have developed any symptoms of COVID 19. If you develop any symptoms (ie: fever, flu-like symptoms, shortness of breath, cough etc.) before then, please call [phone].  If you test positive for Covid 19 in the 2 weeks post procedure, please call and report this information to [name].    If any biopsies were taken you will be contacted by phone or by letter within the next 1-3 weeks.  Please call us at [phone] if you have not heard about the biopsies in 3 weeks.    SIGNATURES/CONFIDENTIALITY: You and/or your care partner have signed paperwork which will be entered into your electronic medical record.  These signatures attest to the fact that that the information above on your After Visit Summary has been reviewed and is understood.  Full responsibility of the confidentiality of this discharge information lies with you and/or your care-partner.

## 2018-10-17 NOTE — Progress Notes (Signed)
Report to PACU, RN, vss, BBS= Clear.  

## 2018-10-19 ENCOUNTER — Telehealth: Payer: Self-pay

## 2018-10-19 NOTE — Telephone Encounter (Signed)
  Follow up Call-  Call back number 10/17/2018  Post procedure Call Back phone  # [phone]  Permission to leave phone message Yes  Some recent data might be hidden     Patient questions:  Do you have a fever, pain , or abdominal swelling? No. Pain Score  0 *  Have you tolerated food without any problems? Yes.    Have you been able to return to your normal activities? Yes.    Do you have any questions about your discharge instructions: Diet   No. Medications  No. Follow up visit  No.  Do you have questions or concerns about your Care? No.  Actions: * If pain score is 4 or above: No action needed, pain <4.  1. Have you developed a fever since your procedure? No  2.   Have you had an respiratory symptoms (SOB or cough) since your procedure? No  3.   Have you tested positive for COVID 19 since your procedure No  4.   Have you had any family members/close contacts diagnosed with the COVID 19 since your procedure?  No   If yes to any of these questions please route to Joylene John, RN and Alphonsa Gin, RN.

## 2018-10-23 ENCOUNTER — Telehealth: Payer: Self-pay

## 2018-10-23 NOTE — Telephone Encounter (Signed)
-----   Message from Milus Banister, MD sent at 10/17/2018 11:22 AM EDT ----- He needs (repeat) hi resoluation esophageal manometry for chronic dysphagia, ? Achalasia.  Thanks

## 2018-10-25 ENCOUNTER — Other Ambulatory Visit: Payer: Self-pay

## 2018-10-25 ENCOUNTER — Ambulatory Visit (INDEPENDENT_AMBULATORY_CARE_PROVIDER_SITE_OTHER): Payer: PPO | Admitting: Family Medicine

## 2018-10-25 ENCOUNTER — Encounter: Payer: Self-pay | Admitting: Family Medicine

## 2018-10-25 VITALS — BP 120/70 | HR 65 | Temp 98.3°F | Ht 70.5 in | Wt 192.8 lb

## 2018-10-25 DIAGNOSIS — R1310 Dysphagia, unspecified: Secondary | ICD-10-CM

## 2018-10-25 DIAGNOSIS — R1030 Lower abdominal pain, unspecified: Secondary | ICD-10-CM | POA: Diagnosis not present

## 2018-10-25 DIAGNOSIS — K5792 Diverticulitis of intestine, part unspecified, without perforation or abscess without bleeding: Secondary | ICD-10-CM

## 2018-10-25 LAB — POCT URINALYSIS DIPSTICK
Bilirubin, UA: NEGATIVE
Blood, UA: 10
Glucose, UA: NEGATIVE
Ketones, UA: NEGATIVE
Leukocytes, UA: NEGATIVE
Nitrite, UA: NEGATIVE
Protein, UA: NEGATIVE
Spec Grav, UA: 1.02 (ref 1.010–1.025)
Urobilinogen, UA: 0.2 E.U./dL
pH, UA: 6 (ref 5.0–8.0)

## 2018-10-25 MED ORDER — METRONIDAZOLE 500 MG PO TABS: 500.0000 mg | ORAL_TABLET | Freq: Three times a day (TID) | ORAL | 0 refills | Status: DC

## 2018-10-25 MED ORDER — CEFUROXIME AXETIL 500 MG PO TABS: 500.0000 mg | ORAL_TABLET | Freq: Two times a day (BID) | ORAL | 0 refills | Status: DC

## 2018-10-25 NOTE — Progress Notes (Signed)
   Subjective:    Patient ID: Steven [name], male    DOB: 05/30/1950, 68 y.o.   MRN: [id number]  HPI Here for 3 days of lower abdominal pains that come and go. No fever. No nausea or vomiting. Normal BMs. No urinary symptoms.    Review of Systems  Constitutional: Negative.   Respiratory: Negative.   Cardiovascular: Negative.   Gastrointestinal: Positive for abdominal pain. Negative for abdominal distention, anal bleeding, blood in stool, constipation, diarrhea, nausea, rectal pain and vomiting.  Genitourinary: Negative.        Objective:   Physical Exam Constitutional:      Appearance: Normal appearance.  Cardiovascular:     Rate and Rhythm: Normal rate and regular rhythm.     Pulses: Normal pulses.     Heart sounds: Normal heart sounds.  Pulmonary:     Effort: Pulmonary effort is normal.     Breath sounds: Normal breath sounds.  Abdominal:     General: Abdomen is flat. Bowel sounds are normal. There is no distension.     Palpations: Abdomen is soft. There is no mass.     Tenderness: There is no guarding or rebound.     Hernia: No hernia is present.     Comments: Tender across the entire lower abdomen   Neurological:     Mental Status: He is alert.           Assessment & Plan:  Diverticulitis, treat with Ceftin and Flagyl. Recheck prn.  Alysia Penna, MD

## 2018-10-25 NOTE — Telephone Encounter (Signed)
The pt has been advised of the instructions for COVID testing and Manometry.  The pt will call after reviewing the information sent to My Chart.     Due to recent COVID-19 restrictions implemented by our local and state authorities and in an effort to keep both patients and staff as safe as possible, our hospital system now requires COVID-19 testing prior to any scheduled hospital procedure. Please go to our [facility] location drive thru testing site ([address] [place], [place] [address]) on 10/31/18 at  840 am. There will be multiple testing areas, the first checkpoint being for pre-procedure/surgery testing. Get into the right (yellow) lane that leads to the PAT testing team. You will not be billed at the time of testing but may receive a bill later depending on your insurance. The approximate cost of the test is $100. You must agree to quarantine from the time of your testing until the procedure date on 11/03/18 . This should include staying at home with ONLY the people you live with. Avoid take-out, grocery store shopping or leaving the house for any non-emergent reason. Failure to have your COVID-19 test done on the date and time you have been scheduled will result in cancellation of procedure. Please call our office at [phone] if you have any questions.    You have been scheduled for an esophageal manometry at [facility] on 11/03/18 at 1030 am. Please arrive 30 minutes prior to your procedure for registration. You will need to go to outpatient registration (1st floor of the hospital) first. Make certain to bring your insurance cards as well as a complete list of medications.  Please remember the following:  1) Do not take any muscle relaxants, xanax (alprazolam) or ativan for 1 day prior to your test as well as the day of the test.  2) Nothing to eat or drink for 4 hours before your test.  3) Hold all diabetic medications/insulin the morning of the test. You may eat  and take your medications after the test.  It will take at least 2 weeks to receive the results of this test from your physician. ------------------------------------------ ABOUT ESOPHAGEAL MANOMETRY Esophageal manometry (muh-NOM-uh-tree) is a test that gauges how well your esophagus works. Your esophagus is the long, muscular tube that connects your throat to your stomach. Esophageal manometry measures the rhythmic muscle contractions (peristalsis) that occur in your esophagus when you swallow. Esophageal manometry also measures the coordination and force exerted by the muscles of your esophagus.  During esophageal manometry, a thin, flexible tube (catheter) that contains sensors is passed through your nose, down your esophagus and into your stomach. Esophageal manometry can be helpful in diagnosing some mostly uncommon disorders that affect your esophagus.  Why it's done Esophageal manometry is used to evaluate the movement (motility) of food through the esophagus and into the stomach. The test measures how well the circular bands of muscle (sphincters) at the top and bottom of your esophagus open and close, as well as the pressure, strength and pattern of the wave of esophageal muscle contractions that moves food along.  What you can expect Esophageal manometry is an outpatient procedure done without sedation. Most people tolerate it well. You may be asked to change into a hospital gown before the test starts.  During esophageal manometry  . While you are sitting up, a member of your health care team sprays your throat with a numbing medication or puts numbing gel in your nose or both.  Marland Kitchen  A catheter is guided through your nose into your esophagus. The catheter may be sheathed in a water-filled sleeve. It doesn't interfere with your breathing. However, your eyes may water, and you may gag. You may have a slight nosebleed from irritation.  . After the catheter is in place, you may be asked to lie on  your back on an exam table, or you may be asked to remain seated.  . You then swallow small sips of water. As you do, a computer connected to the catheter records the pressure, strength and pattern of your esophageal muscle contractions.  . During the test, you'll be asked to breathe slowly and smoothly, remain as still as possible, and swallow only when you're asked to do so.  . A member of your health care team may move the catheter down into your stomach while the catheter continues its measurements.  . The catheter then is slowly withdrawn. The test usually lasts 20 to 30 minutes.  After esophageal manometry  When your esophageal manometry is complete, you may return to your normal activities  This test typically takes 30-45 minutes to complete. ___________________________________

## 2018-10-27 ENCOUNTER — Ambulatory Visit: Payer: PPO | Admitting: Family Medicine

## 2018-10-31 ENCOUNTER — Other Ambulatory Visit (HOSPITAL_COMMUNITY)
Admission: RE | Admit: 2018-10-31 | Discharge: 2018-10-31 | Disposition: A | Discharge status: Home / Self Care | Payer: PPO | Source: Ambulatory Visit | Attending: Gastroenterology | Admitting: Gastroenterology

## 2018-10-31 DIAGNOSIS — Z20828 Contact with and (suspected) exposure to other viral communicable diseases: Secondary | ICD-10-CM | POA: Insufficient documentation

## 2018-10-31 DIAGNOSIS — Z01812 Encounter for preprocedural laboratory examination: Secondary | ICD-10-CM | POA: Diagnosis not present

## 2018-11-02 LAB — NOVEL CORONAVIRUS, NAA (HOSP ORDER, SEND-OUT TO REF LAB; TAT 18-24 HRS): SARS-CoV-2, NAA: NOT DETECTED

## 2018-11-03 ENCOUNTER — Encounter (HOSPITAL_COMMUNITY)
Admission: RE | Disposition: A | Discharge status: Home / Self Care | Payer: Self-pay | Source: Home / Self Care | Attending: Gastroenterology

## 2018-11-03 ENCOUNTER — Ambulatory Visit (HOSPITAL_COMMUNITY)
Admission: RE | Admit: 2018-11-03 | Discharge: 2018-11-03 | Disposition: A | Discharge status: Home / Self Care | Payer: PPO | Attending: Gastroenterology | Admitting: Gastroenterology

## 2018-11-03 DIAGNOSIS — R1310 Dysphagia, unspecified: Secondary | ICD-10-CM | POA: Insufficient documentation

## 2018-11-03 DIAGNOSIS — R1319 Other dysphagia: Secondary | ICD-10-CM

## 2018-11-03 HISTORY — PX: ESOPHAGEAL MANOMETRY: SHX5429

## 2018-11-03 SURGERY — MANOMETRY, ESOPHAGUS
Anesthesia: Choice

## 2018-11-03 MED ORDER — LIDOCAINE VISCOUS HCL 2 % MT SOLN
OROMUCOSAL | Status: AC
  Filled 2018-11-03: qty 15

## 2018-11-03 SURGICAL SUPPLY — 2 items
FACESHIELD LNG OPTICON STERILE (SAFETY) IMPLANT
GLOVE BIO SURGEON STRL SZ8 (GLOVE) ×6 IMPLANT

## 2018-11-03 NOTE — Progress Notes (Signed)
Esophageal Manometry done per protocol. Pt tolerated well without distress or complication.  

## 2018-11-06 ENCOUNTER — Encounter (HOSPITAL_COMMUNITY): Payer: Self-pay | Admitting: Gastroenterology

## 2018-11-13 ENCOUNTER — Ambulatory Visit (INDEPENDENT_AMBULATORY_CARE_PROVIDER_SITE_OTHER): Payer: PPO | Admitting: Family Medicine

## 2018-11-13 ENCOUNTER — Encounter: Payer: Self-pay | Admitting: Family Medicine

## 2018-11-13 ENCOUNTER — Other Ambulatory Visit: Payer: Self-pay

## 2018-11-13 VITALS — BP 110/70 | HR 64 | Temp 98.3°F | Ht 70.0 in | Wt 193.0 lb

## 2018-11-13 DIAGNOSIS — G4733 Obstructive sleep apnea (adult) (pediatric): Secondary | ICD-10-CM | POA: Diagnosis not present

## 2018-11-13 DIAGNOSIS — Z23 Encounter for immunization: Secondary | ICD-10-CM | POA: Diagnosis not present

## 2018-11-13 DIAGNOSIS — Z7901 Long term (current) use of anticoagulants: Secondary | ICD-10-CM | POA: Diagnosis not present

## 2018-11-13 DIAGNOSIS — Z0000 Encounter for general adult medical examination without abnormal findings: Secondary | ICD-10-CM | POA: Diagnosis not present

## 2018-11-13 LAB — LIPID PANEL
Cholesterol: 204 mg/dL — ABNORMAL HIGH (ref 0–200)
HDL: 39.5 mg/dL (ref >39.00)
LDL Cholesterol: 131 mg/dL — ABNORMAL HIGH (ref 0–99)
NonHDL: 164.68
Total CHOL/HDL Ratio: 5
Triglycerides: 167 mg/dL — ABNORMAL HIGH (ref 0.0–149.0)
VLDL: 33.4 mg/dL (ref 0.0–40.0)

## 2018-11-13 LAB — CBC WITH DIFFERENTIAL/PLATELET
Basophils Absolute: 0 10*3/uL (ref 0.0–0.1)
Basophils Relative: 1 % (ref 0.0–3.0)
Eosinophils Absolute: 0.1 10*3/uL (ref 0.0–0.7)
Eosinophils Relative: 2.1 % (ref 0.0–5.0)
HCT: 44.9 % (ref 39.0–52.0)
Hemoglobin: 15.4 g/dL (ref 13.0–17.0)
Lymphocytes Relative: 29.6 % (ref 12.0–46.0)
Lymphs Abs: 1.3 10*3/uL (ref 0.7–4.0)
MCHC: 34.3 g/dL (ref 30.0–36.0)
MCV: 87.5 fl (ref 78.0–100.0)
Monocytes Absolute: 0.4 10*3/uL (ref 0.1–1.0)
Monocytes Relative: 9.6 % (ref 3.0–12.0)
Neutro Abs: 2.6 10*3/uL (ref 1.4–7.7)
Neutrophils Relative %: 57.7 % (ref 43.0–77.0)
Platelets: 246 10*3/uL (ref 150.0–400.0)
RBC: 5.13 Mil/uL (ref 4.22–5.81)
RDW: 13.5 % (ref 11.5–15.5)
WBC: 4.4 10*3/uL (ref 4.0–10.5)

## 2018-11-13 LAB — POC URINALSYSI DIPSTICK (AUTOMATED)
Glucose, UA: NEGATIVE
Leukocytes, UA: NEGATIVE
Protein, UA: NEGATIVE
Spec Grav, UA: 1.025 (ref 1.010–1.025)
Urobilinogen, UA: 0.2 E.U./dL
pH, UA: 6 (ref 5.0–8.0)

## 2018-11-13 LAB — PROTIME-INR
INR: 1.1 ratio — ABNORMAL HIGH (ref 0.8–1.0)
Prothrombin Time: 13.3 s — ABNORMAL HIGH (ref 9.6–13.1)

## 2018-11-13 LAB — HEPATIC FUNCTION PANEL
ALT: 34 U/L (ref 0–53)
AST: 27 U/L (ref 0–37)
Albumin: 4.5 g/dL (ref 3.5–5.2)
Alkaline Phosphatase: 50 U/L (ref 39–117)
Bilirubin, Direct: 0.1 mg/dL (ref 0.0–0.3)
Total Bilirubin: 0.6 mg/dL (ref 0.2–1.2)
Total Protein: 6.9 g/dL (ref 6.0–8.3)

## 2018-11-13 LAB — BASIC METABOLIC PANEL
BUN: 19 mg/dL (ref 6–23)
CO2: 28 mEq/L (ref 19–32)
Calcium: 9.5 mg/dL (ref 8.4–10.5)
Chloride: 104 mEq/L (ref 96–112)
Creatinine, Ser: 1.3 mg/dL (ref 0.40–1.50)
GFR: 54.57 mL/min — ABNORMAL LOW (ref >60.00)
Glucose, Bld: 102 mg/dL — ABNORMAL HIGH (ref 70–99)
Potassium: 4.2 mEq/L (ref 3.5–5.1)
Sodium: 140 mEq/L (ref 135–145)

## 2018-11-13 LAB — TSH: TSH: 1.19 u[IU]/mL (ref 0.35–4.50)

## 2018-11-13 LAB — PSA: PSA: 1.43 ng/mL (ref 0.10–4.00)

## 2018-11-13 MED ORDER — LOSARTAN POTASSIUM 50 MG PO TABS: 50.0000 mg | ORAL_TABLET | Freq: Every day | ORAL | 3 refills | Status: DC

## 2018-11-13 MED ORDER — RIVAROXABAN 20 MG PO TABS: 20.0000 mg | ORAL_TABLET | Freq: Every day | ORAL | 3 refills | Status: DC

## 2018-11-13 MED ORDER — TEMAZEPAM 30 MG PO CAPS: 30.0000 mg | ORAL_CAPSULE | Freq: Every evening | ORAL | 1 refills | Status: DC | PRN

## 2018-11-13 MED ORDER — FENOFIBRATE 145 MG PO TABS: 145.0000 mg | ORAL_TABLET | Freq: Every evening | ORAL | 3 refills | Status: DC

## 2018-11-13 NOTE — Patient Instructions (Signed)
Health Maintenance Due  Topic Date Due  . Hepatitis C Screening  12/05/1950  . TETANUS/TDAP  03/11/2012  . INFLUENZA VACCINE  08/12/2018    Depression screen [facility] 2/9 05/18/2017 06/24/2014  Decreased Interest 0 0  Down, Depressed, Hopeless 0 0  PHQ - 2 Score 0 0

## 2018-11-13 NOTE — Progress Notes (Signed)
Subjective:    Patient ID: Steven [name], male    DOB: 02-17-1950, 68 y.o.   MRN: [id number]  HPI Here for a well exam. He has a few issues to discuss. First he says he sweats more than he used to, especially when he exerts himself. He also gets tired more quickly than he used to. When he works in his yard he has to stop and rest sooner than he used to. No chest pain or SOB. He has had trouble sleeping for years and it has been worse lately. He often lies awake in bed for hours at a time. He sleeps better when he takes a Temazepam, but he tries to only take this once a week. He does snore and he tends to fall asleep when he is reading or watching TV. He has been experiencing trouble swallowing and has episodes of chest pain and food hanging up when he swallows about 3 times a week. He takes Aciphex daily. On 11-03-18 he had esophageal manometry performed with Dr. Ardis Hughs, and this was normal except for a small hiatal hernia. 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 He sees Dr. Amedeo Plenty for this and he has received 3 steroid injections into the MCP joint with only brief relief of pain. Dr. Amedeo Plenty has proposed a surgery, but he would need to stay off Xarelto 2 days prior to the surgery and one day after. [name] asks if this would be safe to do.    Review of Systems  Constitutional: Positive for fatigue.  HENT: Positive for trouble swallowing.   Eyes: Negative.   Respiratory: Negative.   Cardiovascular: Negative.   Gastrointestinal: Negative.   Genitourinary: Negative.   Musculoskeletal: Positive for arthralgias.  Skin: Negative.   Neurological: Negative.   Psychiatric/Behavioral: Negative.        Objective:   Physical Exam Constitutional:      General: He is not in acute distress.    Appearance: He is well-developed. He is not diaphoretic.  HENT:     Head: Normocephalic and atraumatic.     Right Ear: External ear normal.     Left Ear:  External ear normal.     Nose: Nose normal.     Mouth/Throat:     Pharynx: No oropharyngeal exudate.  Eyes:     General: No scleral icterus.       Right eye: No discharge.        Left eye: No discharge.     Conjunctiva/sclera: Conjunctivae normal.     Pupils: Pupils are equal, round, and reactive to light.  Neck:     Musculoskeletal: Neck supple.     Thyroid: No thyromegaly.     Vascular: No JVD.     Trachea: No tracheal deviation.  Cardiovascular:     Rate and Rhythm: Normal rate and regular rhythm.     Heart sounds: Normal heart sounds. No murmur. No friction rub. No gallop.   Pulmonary:     Effort: Pulmonary effort is normal. No respiratory distress.     Breath sounds: Normal breath sounds. No wheezing or rales.  Chest:     Chest wall: No tenderness.  Abdominal:     General: Bowel sounds are normal. There is no distension.     Palpations: Abdomen is soft. There is no mass.     Tenderness: There is no abdominal tenderness. There is no guarding or rebound.  Genitourinary:    Penis:  Normal. No tenderness.      Scrotum/Testes: Normal.     Prostate: Normal.     Rectum: Normal. Guaiac result negative.  Musculoskeletal: Normal range of motion.        General: No tenderness.  Lymphadenopathy:     Cervical: No cervical adenopathy.  Skin:    General: Skin is warm and dry.     Coloration: Skin is not pale.     Findings: No erythema or rash.  Neurological:     Mental Status: He is alert and oriented to person, place, and time.     Cranial Nerves: No cranial nerve deficit.     Motor: No abnormal muscle tone.     Coordination: Coordination normal.     Deep Tendon Reflexes: Reflexes are normal and symmetric. Reflexes normal.  Psychiatric:        Behavior: Behavior normal.        Thought Content: Thought content normal.        Judgment: Judgment normal.           Assessment & Plan:  Well exam. We discussed diet and exercise. Get fasting labs. It is likely that he has  sleep apnea, so we will refer him for a sleep study. As for thumb surgery, I told him I think this would be safe for him to pursue. He will discuss this with Dr. Amedeo Plenty. As for the apparent esophageal spasms, he will follow up with Dr. Ardis Hughs about treatment options.  Alysia Penna, MD

## 2018-11-16 ENCOUNTER — Telehealth: Payer: Self-pay | Admitting: Family Medicine

## 2018-11-16 NOTE — Telephone Encounter (Signed)
Patient's wife returning call to [facility] for lab results. Not specified if [facility] can release.

## 2018-11-16 NOTE — Telephone Encounter (Signed)
Pt spouse notified of update. No further action needed!

## 2018-11-21 DIAGNOSIS — R1310 Dysphagia, unspecified: Secondary | ICD-10-CM

## 2018-11-21 DIAGNOSIS — R1319 Other dysphagia: Secondary | ICD-10-CM

## 2018-11-23 ENCOUNTER — Other Ambulatory Visit: Payer: Self-pay

## 2018-11-23 MED ORDER — DILTIAZEM HCL ER 60 MG PO CP12: 60.0000 mg | ORAL_CAPSULE | Freq: Every day | ORAL | 11 refills | Status: DC

## 2018-11-24 ENCOUNTER — Other Ambulatory Visit: Payer: Self-pay

## 2018-12-01 ENCOUNTER — Other Ambulatory Visit: Payer: Self-pay

## 2018-12-27 ENCOUNTER — Other Ambulatory Visit: Payer: Self-pay

## 2018-12-27 ENCOUNTER — Ambulatory Visit: Payer: PPO | Attending: Internal Medicine

## 2018-12-27 DIAGNOSIS — Z20822 Contact with and (suspected) exposure to covid-19: Secondary | ICD-10-CM

## 2018-12-27 DIAGNOSIS — Z20828 Contact with and (suspected) exposure to other viral communicable diseases: Secondary | ICD-10-CM | POA: Diagnosis not present

## 2018-12-29 LAB — NOVEL CORONAVIRUS, NAA: SARS-CoV-2, NAA: NOT DETECTED

## 2019-02-07 DIAGNOSIS — R3911 Hesitancy of micturition: Secondary | ICD-10-CM | POA: Diagnosis not present

## 2019-02-07 DIAGNOSIS — N200 Calculus of kidney: Secondary | ICD-10-CM | POA: Diagnosis not present

## 2019-02-07 DIAGNOSIS — R311 Benign essential microscopic hematuria: Secondary | ICD-10-CM | POA: Diagnosis not present

## 2019-02-07 DIAGNOSIS — N401 Enlarged prostate with lower urinary tract symptoms: Secondary | ICD-10-CM | POA: Diagnosis not present

## 2019-03-06 DIAGNOSIS — R311 Benign essential microscopic hematuria: Secondary | ICD-10-CM | POA: Diagnosis not present

## 2019-03-06 DIAGNOSIS — N200 Calculus of kidney: Secondary | ICD-10-CM | POA: Diagnosis not present

## 2019-03-06 DIAGNOSIS — N400 Enlarged prostate without lower urinary tract symptoms: Secondary | ICD-10-CM | POA: Diagnosis not present

## 2019-03-14 ENCOUNTER — Telehealth: Payer: Self-pay | Admitting: Family Medicine

## 2019-03-14 NOTE — Chronic Care Management (AMB) (Signed)
  Chronic Care Management   Note  03/14/2019 Name: Steven [name] MRN: [id number] DOB: 1950-05-04  Steven [name] is a 69 y.o. year old male who is a primary care patient of Laurey Morale, MD. I reached out to [place] by phone today in response to a referral sent by Steven [name] PCP, Laurey Morale, MD.   [name] was given information about Chronic Care Management services today including:  1. CCM service includes personalized support from designated clinical staff supervised by his physician, including individualized plan of care and coordination with other care providers 2. 24/7 contact phone numbers for assistance for urgent and routine care needs. 3. Service will only be billed when office clinical staff spend 20 minutes or more in a month to coordinate care. 4. Only one practitioner may furnish and bill the service in a calendar month. 5. The patient may stop CCM services at any time (effective at the end of the month) by phone call to the office staff. 6. The patient will be responsible for cost sharing (co-pay) of up to 20% of the service fee (after annual deductible is met).  Patient agreed to services and verbal consent obtained.   Follow up plan:   Raynicia Dukes [employer] Scheduler

## 2019-03-27 ENCOUNTER — Telehealth: Payer: Self-pay

## 2019-03-27 DIAGNOSIS — I10 Essential (primary) hypertension: Secondary | ICD-10-CM

## 2019-03-27 DIAGNOSIS — I2699 Other pulmonary embolism without acute cor pulmonale: Secondary | ICD-10-CM

## 2019-03-27 NOTE — Telephone Encounter (Signed)
I am requesting an ambulatory referral to CCM be placed for this patient. Referral will need to have 2 current diagnosis attached to it. Thank you!  

## 2019-03-27 NOTE — Telephone Encounter (Signed)
Done

## 2019-03-29 ENCOUNTER — Other Ambulatory Visit: Payer: Self-pay

## 2019-03-29 ENCOUNTER — Ambulatory Visit: Payer: PPO

## 2019-03-29 DIAGNOSIS — Z7901 Long term (current) use of anticoagulants: Secondary | ICD-10-CM

## 2019-03-29 DIAGNOSIS — I10 Essential (primary) hypertension: Secondary | ICD-10-CM

## 2019-03-29 DIAGNOSIS — E785 Hyperlipidemia, unspecified: Secondary | ICD-10-CM

## 2019-03-29 DIAGNOSIS — K5792 Diverticulitis of intestine, part unspecified, without perforation or abscess without bleeding: Secondary | ICD-10-CM | POA: Insufficient documentation

## 2019-03-29 NOTE — Chronic Care Management (AMB) (Signed)
Chronic Care Management Pharmacy  Name: ULICES SASSE  MRN: [id number] DOB: [birth year]  Initial Questions: 1. Have you seen any other providers since your last visit? NA 2. Any changes in your medicines or health? No   Chief Complaint/ HPI  Jaymes Graff Olds,  69 y.o. , male presents for their Initial CCM visit with the clinical pharmacist via telephone due to COVID-19 Pandemic.  PCP : Laurey Morale, MD  Their chronic conditions include: HTN, HLD, Recurrent DVT/ PE, Migraine headaches, GERD/ chronic dysphagia, insomnia   Office Visits: 11/13/2018- Patient presented for office visit with Dr. Alysia Penna, MD for annual exam. Patient discussed to obtain fasting labs. Patient advised safe to pursue thumb surgery. Patient to follow up with Dr. Ardis Hughs for esophageal spasms.   10/25/2018- Patient presented for office visit with Dr. Alysia Penna, MD for diverticulitis. Patient reported 3 days of lower abdominal pain. No fever, nausea, vomiting and normal bowel movements. Patient given Ceftin and Flagyl and to follow up as needed.   Consult Visit: 02/07/2019- Urology- Patient presented to Dr. Festus Aloe.   10/11/2018- Orthopedic surgery- patient presented to Dr. Annie Main for mass of skin of left thumb.   10/10/2018- Gastroenterology- Patient presented to Dr. Owens Loffler, MD for chief complaint: chronic dysphagia. Patient notes it is worse lately. Heartburn controlled on Aciphex 20mg  once to twice daily. Previous manometry test in 2015 did not prove achalasia. Plan: for repeat esophageal manometry for worsening dysphasia. Patient to need upper endoscopy to exclude infection or neoplasm. Patient nervous on holing Xarelto for 2-3 days. Patient did agree to diagnostic EGD.   Medications: Outpatient Encounter Medications as of 03/29/2019  Medication Sig  . acyclovir cream (ZOVIRAX) 5 % Apply 1 application topically daily as needed (fever blisters).  . clobetasol cream (TEMOVATE) [id number] % Apply 1  application topically daily as needed (herpes).   Marland Kitchen diltiazem (CARDIZEM SR) 60 MG 12 hr capsule Take 1 capsule (60 mg total) by mouth daily.  Marland Kitchen eletriptan (RELPAX) 20 MG tablet Take 1 tablet (20 mg total) by mouth as needed for migraine.  . ezetimibe (ZETIA) 10 MG tablet Take 1 tablet (10 mg total) by mouth daily.  . fenofibrate (TRICOR) 145 MG tablet Take 1 tablet (145 mg total) by mouth every evening.  . folic acid (FOLVITE) 1 MG tablet Take 1 mg by mouth daily.  . hydrocortisone-pramoxine (PROCTOFOAM HC) rectal foam Place 1 applicator rectally 2 (two) times daily.  Marland Kitchen losartan (COZAAR) 50 MG tablet Take 1 tablet (50 mg total) by mouth daily.  . RABEprazole (ACIPHEX) 20 MG tablet Take 1 tablet (20 mg total) by mouth 2 (two) times daily.  . rivaroxaban (XARELTO) 20 MG TABS tablet Take 1 tablet (20 mg total) by mouth daily with supper.  . temazepam (RESTORIL) 30 MG capsule Take 1 capsule (30 mg total) by mouth at bedtime as needed for sleep.   No facility-administered encounter medications on file as of 03/29/2019.    Current Diagnosis/Assessment:  Goals Addressed   None    Hypertension   BP today is:  <140/90  Office blood pressures are  BP Readings from Last 3 Encounters:  11/13/18 110/70  10/25/18 120/70  10/17/18 136/75   Patient has failed these meds in the past: amlodipine, lisinopril   Patient checks BP at home 1-2x per week  Patient home BP readings are ranging: 136/40mmHg   Patient is controlled on:  - losartan 50mg , 1 tablet daily   We discussed diet and exercise extensively .  DASH diet:  following a diet emphasizing fruits and vegetables and low-fat dairy products along with whole grains, fish, poultry, and nuts. Reducing red meats and sugars.  . (Patient notes eating: for breakfast: eggs, bacon, toast. For later in the day, meals would include: vegetables and chicken. Patient does not eat fried foods. Most meals are grilled or baked).   . Exercising  . Reducing  the amount of salt intake to 1500mg /per day.  . Recommend using a salt substitute to replace your salt if you need flavor.    . Weight reduction- We discussed losing 5-10% of body weight.    Plan Continue current medications   Hyperlipidemia   Lipid Panel     Component Value Date/Time   CHOL 204 (H) 11/13/2018 0852   TRIG 167.0 (H) 11/13/2018 0852   HDL 39.50 11/13/2018 0852   CHOLHDL 5 11/13/2018 0852   VLDL 33.4 11/13/2018 0852   LDLCALC 131 (H) 11/13/2018 0852   LDLDIRECT 143.6 08/28/2012 0915    The 10-year ASCVD risk score Mikey Bussing DC Jr., et al., 2013) is: 18.2%   Values used to calculate the score:     Age: 69 years     Sex: Male     Is Non-Hispanic African American: No     Diabetic: No     Tobacco smoker: No     Systolic Blood Pressure: [id number] mmHg     Is BP treated: Yes     HDL Cholesterol: 39.5 mg/dL     Total Cholesterol: 204 mg/dL   Patient has failed these meds in past: none  Patient is currently uncontrolled on the following medications:  - ezetimibe 10mg , 1 tablet daily - fenofibrate 145mg , 1 tablet every evening  We discussed:  diet and exercise extensively . How to reduce cholesterol through diet/weight management and physical activity.    . We discussed how a diet high in plant sterols (fruits/vegetables/nuts/whole grains/legumes) may reduce your cholesterol.  Encouraged increasing fiber to a daily intake of 10-25g/day    Plan Patient notes days before previous lab work, he ate several country sausages.  Reassess at next lipid panel.  Continue current medications.   Recurrent DVT/ PE   Patient has failed these meds in past: none  Patient is currently controlled on the following medications:  - rivaroxaban (Xarelto) 20mg , 1 tablet daily with supper - folic acid [id number] mcg, 1 tablet daily   We discussed:  monitoring for signs and symptoms for bleeding (coughing up blood, prolonged nose bleeds, black, tarry stools).  Plan Continue current medications.    Migraine headaches   Patient has failed these meds in past: none Patient is currently controlled on the following medications:  - eletriptan (Relpax) 20mg , 1 tablet as needed for migraines   Plan Patient notes he had has not needed to take dose in several years.  Continue current medications.   GERD/ chronic dysphagia   Patient has failed these meds in past: Nexium, diltiazem  Patient is currently controlled on the following medications:  - rabeprazole (Aciphex) 20mg , 1 tablet twice daily   We discussed:  non-pharmacological interventions for acid reflux. Take measures to prevent acid reflux, such as avoiding spicy foods, avoiding caffeine, avoid laying down a few hours after eating, and raising the head of the bed.  - patient endorses symptoms when overeating.   Plan Managed by Dr. Owens Loffler (GI).  Discussed diltiazem prescribed by Dr. Ardis Hughs; patient did not start and does not want to start.   Continue current  medications.   Insomnia    Patient has failed these meds in past: melatonin  Patient is currently controlled on the following medications:  - temazepam 30mg , 1 capsule at bedtime as needed for sleep (patient endorses it helps him, but he does not use nightly )  - Tylenol PM, 1 tablet as needed for sleep (patient uses about 2 times per week if needed; sometimes not at all)  We discussed:  non-pharmacological interventions for insomnia. (Avoid napping, limit exposure to technology near bedtime, etc  Plan Discussed use of pharmacological interventions. Patient notes will only continue use of temazepam.  Continue current medications.   Medication Management  Patient organizes medications: patient states he has routine.  Barriers: patient denies issues obtaining medications.  Adherence: no gap in refill history (per medication dispense from 10/06/2018 to 03/29/2019)   Follow up Follow up visit with PharmD in 1 year. Will conduct general telephone calls for periodic  check-ins before next visit.  Patient requested refill for Magic mouthwash to have on hand. Previously requested.   Anson Crofts, PharmD Clinical Pharmacist [facility] [phone]

## 2019-04-04 ENCOUNTER — Telehealth: Payer: Self-pay

## 2019-04-04 DIAGNOSIS — I10 Essential (primary) hypertension: Secondary | ICD-10-CM

## 2019-04-04 DIAGNOSIS — E785 Hyperlipidemia, unspecified: Secondary | ICD-10-CM

## 2019-04-04 NOTE — Patient Instructions (Addendum)
Visit Information  Goals Addressed            This Visit's Progress   . Pharmacy Care Plan       CARE PLAN ENTRY  Current Barriers:  . Chronic Disease Management support, education, and care coordination needs related to HTN, HLD, and Recurrent DVT/ PE, Migraine headaches, GERD/ chronic dysphagia, and insomnia  Pharmacist Clinical Goal(s):  Marland Kitchen Work with the care management team to address educational, disease management, and care coordination needs  . Call provider office for new or worsened signs and symptoms  . Call care management team with questions or concerns.  . Blood pressure:   Marland Kitchen Maintain blood pressure within goal of your provider (130/80 or 140/90)  . High cholesterol:  . Cholesterol goals: Total Cholesterol goal under 200, Triglycerides goal under 150, HDL goal above 40 (men) or above 50 (women), LDL goal under 100.  Marland Kitchen Recurrent blood clots . Decrease risk of blood clot formation.  Marland Kitchen Heart burn: Minimize reflux symptoms.  . Migraine headaches:  . Decrease migraine episodes.  . Insomnia .  Continue to see improvement in insomnia.  Interventions: . Comprehensive medication review performed. . Evaluation of current treatment plans and patient's adherence to plan as established by providers. . Assessed patient's education and care coordination needs . Provided disease specific education to patient . Blood pressure:  . Discussed need to continue checking blood pressure at home.  . Discussed diet modifications. DASH diet:  following a diet emphasizing fruits and vegetables and low-fat dairy products along with whole grains, fish, poultry, and nuts. Reducing red meats and sugars.  . Exercising  . Reducing the amount of salt intake to 1500mg /per day.  . Recommend using a salt substitute to replace your salt if you need flavor.    . Weight reduction- We discussed losing 5-10% of body weight . Continue:  losartan 50mg , 1 tablet daily  . High cholesterol . How to reduce  cholesterol through diet/weight management and physical activity.    . We discussed how a diet high in plant sterols (fruits/vegetables/nuts/whole grains/legumes) may reduce your cholesterol.  Encouraged increasing fiber to a daily intake of 10-25g/day  . Continue:  - ezetimibe 10mg , 1 tablet daily - fenofibrate 145mg , 1 tablet every evening . Recurrent blood clots . Discussed monitoring for signs and symptoms for bleeding (coughing up blood, prolonged nose bleeds, black, tarry stools). . Continue: rivaroxaban (Xarelto) 20mg , 1 tablet daily with supper . Migraine headaches . Continue: eletriptan (Relpax) 20mg , 1 tablet as needed for migraines  . GERD/ chronic dysphagia . Managed by Dr. Owens Loffler  . Continue: rabeprazole (Aciphex) 20mg , 1 tablet twice daily  . Insomnia . Discussed non-pharmacological interventions for insomnia. (Avoid napping, limit exposure to technology near bedtime, etc) . Continue: temazepam 30mg , 1 capsule at bedtime as needed for sleep  Patient Self Care Activities:  . Self administers medications as prescribed and Calls provider office for new concerns or questions . Continue current medications as directed by providers.  . Continue following up with specialists. . Continue at home blood pressure readings. . Continue working on health habits (diet/ exercise).  Initial goal documentation        Steven [name] was given information about Chronic Care Management services today including:  1. CCM service includes personalized support from designated clinical staff supervised by his physician, including individualized plan of care and coordination with other care providers 2. 24/7 contact phone numbers for assistance for urgent and routine care needs. 3. Standard  insurance, coinsurance, copays and deductibles apply for chronic care management only during months in which we provide at least 20 minutes of these services. Most insurances cover these services at 100%,  however patients may be responsible for any copay, coinsurance and/or deductible if applicable. This service may help you avoid the need for more expensive face-to-face services. 4. Only one practitioner may furnish and bill the service in a calendar month. 5. The patient may stop CCM services at any time (effective at the end of the month) by phone call to the office staff.  Patient agreed to services and verbal consent obtained.   The patient verbalized understanding of instructions provided today and agreed to receive a mailed copy of patient instruction and/or educational materials. Telephone follow up appointment with pharmacy team member scheduled for: 03/28/2020  Anson Crofts, PharmD Clinical Pharmacist [facility] [place] [phone]    High Cholesterol  High cholesterol is a condition in which the blood has high levels of a white, waxy, fat-like substance (cholesterol). The human body needs small amounts of cholesterol. The liver makes all the cholesterol that the body needs. Extra (excess) cholesterol comes from the food that we eat. Cholesterol is carried from the liver by the blood through the blood vessels. If you have high cholesterol, deposits (plaques) may build up on the walls of your blood vessels (arteries). Plaques make the arteries narrower and stiffer. Cholesterol plaques increase your risk for heart attack and stroke. Work with your health care provider to keep your cholesterol levels in a healthy range. What increases the risk? This condition is more likely to develop in people who:  Eat foods that are high in animal fat (saturated fat) or cholesterol.  Are overweight.  Are not getting enough exercise.  Have a family history of high cholesterol. What are the signs or symptoms? There are no symptoms of this condition. How is this diagnosed? This condition may be diagnosed from the results of a blood test.  If you are older than age 19,  your health care provider may check your cholesterol every 4-6 years.  You may be checked more often if you already have high cholesterol or other risk factors for heart disease. The blood test for cholesterol measures:  "Bad" cholesterol (LDL cholesterol). This is the main type of cholesterol that causes heart disease. The desired level for LDL is less than 100.  "Good" cholesterol (HDL cholesterol). This type helps to protect against heart disease by cleaning the arteries and carrying the LDL away. The desired level for HDL is 60 or higher.  Triglycerides. These are fats that the body can store or burn for energy. The desired number for triglycerides is lower than 150.  Total cholesterol. This is a measure of the total amount of cholesterol in your blood, including LDL cholesterol, HDL cholesterol, and triglycerides. A healthy number is less than 200. How is this treated? This condition is treated with diet changes, lifestyle changes, and medicines. Diet changes  This may include eating more whole grains, fruits, vegetables, nuts, and fish.  This may also include cutting back on red meat and foods that have a lot of added sugar. Lifestyle changes  Changes may include getting at least 40 minutes of aerobic exercise 3 times a week. Aerobic exercises include walking, biking, and swimming. Aerobic exercise along with a healthy diet can help you maintain a healthy weight.  Changes may also include quitting smoking. Medicines  Medicines are usually given if diet  and lifestyle changes have failed to reduce your cholesterol to healthy levels.  Your health care provider may prescribe a statin medicine. Statin medicines have been shown to reduce cholesterol, which can reduce the risk of heart disease. Follow these instructions at home: Eating and drinking If told by your health care provider:  Eat chicken (without skin), fish, veal, shellfish, ground [place] breast, and round or loin cuts of  red meat.  Do not eat fried foods or fatty meats, such as hot dogs and salami.  Eat plenty of fruits, such as apples.  Eat plenty of vegetables, such as broccoli, potatoes, and carrots.  Eat beans, peas, and lentils.  Eat grains such as barley, rice, couscous, and bulgur wheat.  Eat pasta without cream sauces.  Use skim or nonfat milk, and eat low-fat or nonfat yogurt and cheeses.  Do not eat or drink whole milk, cream, ice cream, egg yolks, or hard cheeses.  Do not eat stick margarine or tub margarines that contain trans fats (also called partially hydrogenated oils).  Do not eat saturated tropical oils, such as coconut oil and palm oil.  Do not eat cakes, cookies, crackers, or other baked goods that contain trans fats.  General instructions  Exercise as directed by your health care provider. Increase your activity level with activities such as gardening, walking, and taking the stairs.  Take over-the-counter and prescription medicines only as told by your health care provider.  Do not use any products that contain nicotine or tobacco, such as cigarettes and e-cigarettes. If you need help quitting, ask your health care provider.  Keep all follow-up visits as told by your health care provider. This is important. Contact a health care provider if:  You are struggling to maintain a healthy diet or weight.  You need help to start on an exercise program.  You need help to stop smoking. Get help right away if:  You have chest pain.  You have trouble breathing. This information is not intended to replace advice given to you by your health care provider. Make sure you discuss any questions you have with your health care provider. Document Revised: 12/31/2016 Document Reviewed: 06/28/2015 Elsevier Patient Education  [employer].  Insomnia Insomnia is a sleep disorder that makes it difficult to fall asleep or stay asleep. Insomnia can cause fatigue, low energy,  difficulty concentrating, mood swings, and poor performance at work or school. There are three different ways to classify insomnia:  Difficulty falling asleep.  Difficulty staying asleep.  Waking up too early in the morning. Any type of insomnia can be long-term (chronic) or short-term (acute). Both are common. Short-term insomnia usually lasts for three months or less. Chronic insomnia occurs at least three times a week for longer than three months. What are the causes? Insomnia may be caused by another condition, situation, or substance, such as:  Anxiety.  Certain medicines.  Gastroesophageal reflux disease (GERD) or other gastrointestinal conditions.  Asthma or other breathing conditions.  Restless legs syndrome, sleep apnea, or other sleep disorders.  Chronic pain.  Menopause.  Stroke.  Abuse of alcohol, tobacco, or illegal drugs.  Mental health conditions, such as depression.  Caffeine.  Neurological disorders, such as Alzheimer's disease.  An overactive thyroid (hyperthyroidism). Sometimes, the cause of insomnia may not be known. What increases the risk? Risk factors for insomnia include:  Gender. Women are affected more often than men.  Age. Insomnia is more common as you get older.  Stress.  Lack of exercise.  Irregular work schedule or working night shifts.  Traveling between different time zones.  Certain medical and mental health conditions. What are the signs or symptoms? If you have insomnia, the main symptom is having trouble falling asleep or having trouble staying asleep. This may lead to other symptoms, such as:  Feeling fatigued or having low energy.  Feeling nervous about going to sleep.  Not feeling rested in the morning.  Having trouble concentrating.  Feeling irritable, anxious, or depressed. How is this diagnosed? This condition may be diagnosed based on:  Your symptoms and medical history. Your health care provider may ask  about: ? Your sleep habits. ? Any medical conditions you have. ? Your mental health.  A physical exam. How is this treated? Treatment for insomnia depends on the cause. Treatment may focus on treating an underlying condition that is causing insomnia. Treatment may also include:  Medicines to help you sleep.  Counseling or therapy.  Lifestyle adjustments to help you sleep better. Follow these instructions at home: Eating and drinking   Limit or avoid alcohol, caffeinated beverages, and cigarettes, especially close to bedtime. These can disrupt your sleep.  Do not eat a large meal or eat spicy foods right before bedtime. This can lead to digestive discomfort that can make it hard for you to sleep. Sleep habits   Keep a sleep diary to help you and your health care provider figure out what could be causing your insomnia. Write down: ? When you sleep. ? When you wake up during the night. ? How well you sleep. ? How rested you feel the next day. ? Any side effects of medicines you are taking. ? What you eat and drink.  Make your bedroom a dark, comfortable place where it is easy to fall asleep. ? Put up shades or blackout curtains to block light from outside. ? Use a white noise machine to block noise. ? Keep the temperature cool.  Limit screen use before bedtime. This includes: ? Watching TV. ? Using your smartphone, tablet, or computer.  Stick to a routine that includes going to bed and waking up at the same times every day and night. This can help you fall asleep faster. Consider making a quiet activity, such as reading, part of your nighttime routine.  Try to avoid taking naps during the day so that you sleep better at night.  Get out of bed if you are still awake after 15 minutes of trying to sleep. Keep the lights down, but try reading or doing a quiet activity. When you feel sleepy, go back to bed. General instructions  Take over-the-counter and prescription medicines  only as told by your health care provider.  Exercise regularly, as told by your health care provider. Avoid exercise starting several hours before bedtime.  Use relaxation techniques to manage stress. Ask your health care provider to suggest some techniques that may work well for you. These may include: ? Breathing exercises. ? Routines to release muscle tension. ? Visualizing peaceful scenes.  Make sure that you drive carefully. Avoid driving if you feel very sleepy.  Keep all follow-up visits as told by your health care provider. This is important. Contact a health care provider if:  You are tired throughout the day.  You have trouble in your daily routine due to sleepiness.  You continue to have sleep problems, or your sleep problems get worse. Get help right away if:  You have serious thoughts about hurting yourself or someone else. If  you ever feel like you may hurt yourself or others, or have thoughts about taking your own life, get help right away. You can go to your nearest emergency department or call:  Your local emergency services (911 in the [place]  A suicide crisis helpline, such as the [facility] at (361) 296-9681. This is open 24 hours a day. Summary  Insomnia is a sleep disorder that makes it difficult to fall asleep or stay asleep.  Insomnia can be long-term (chronic) or short-term (acute).  Treatment for insomnia depends on the cause. Treatment may focus on treating an underlying condition that is causing insomnia.  Keep a sleep diary to help you and your health care provider figure out what could be causing your insomnia. This information is not intended to replace advice given to you by your health care provider. Make sure you discuss any questions you have with your health care provider. Document Revised: 12/10/2016 Document Reviewed: 10/07/2016 Elsevier Patient Education  2020 [employer].

## 2019-04-04 NOTE — Telephone Encounter (Signed)
Done

## 2019-04-04 NOTE — Telephone Encounter (Signed)
Dr. Sarajane Jews, I had CCM visit with patient. Patient requested refill for Magic mouthwash (First- Dukes mouthwash) as he stated he likes to have on hand. Pharmacy: [address].  Thanks!

## 2019-04-06 ENCOUNTER — Telehealth: Payer: Self-pay | Admitting: Family Medicine

## 2019-04-06 NOTE — Telephone Encounter (Signed)
Okay for refill?  Pt notified Dr.Fry has gone and if sx worsen over weekend go to [facility]. Pt stated that he can wait.    Please advise

## 2019-04-06 NOTE — Telephone Encounter (Signed)
Pt is calling to back regarding a prescription for mouthwash that was requested on 04/04/19. Pt uses [facility]. Thanks

## 2019-04-07 NOTE — Telephone Encounter (Signed)
Call in [place] to swish 5 cc in the out QID as needed for irritation, 300 ml with 2 rf

## 2019-04-07 NOTE — Telephone Encounter (Signed)
Left message on pharmacy phone calling in prescription

## 2019-04-09 ENCOUNTER — Telehealth: Payer: Self-pay | Admitting: Family Medicine

## 2019-04-09 MED ORDER — RIVAROXABAN 20 MG PO TABS: 20.0000 mg | ORAL_TABLET | Freq: Every day | ORAL | 3 refills | Status: DC

## 2019-04-09 MED ORDER — EZETIMIBE 10 MG PO TABS: 10.0000 mg | ORAL_TABLET | Freq: Every day | ORAL | 3 refills | Status: DC

## 2019-04-09 NOTE — Telephone Encounter (Signed)
Rx has been sent in. Patient is aware. 

## 2019-04-09 NOTE — Telephone Encounter (Signed)
Medication Refill:  Rabeprazole Ezetimibe   Pharmacy: [facility] FAX: [phone]   Pt's spouse, Rise Paganini says he is almost out, he has about a week left. She would like to be notified wants it is sent in. She was told by the pharmacy that faxing it over is faster.  She can be reached at [phone]

## 2019-04-12 ENCOUNTER — Telehealth: Payer: Self-pay | Admitting: Family Medicine

## 2019-04-12 NOTE — Telephone Encounter (Signed)
Pt is requesting a refill for Rabeprazole 20 mg tablet. Per pt, [facility] is saying, they did not receive the first request for medication. Pt is aware that Dr. Sarajane Jews, is out of the office and will return on 04/16/19. Thanks  [facility] Fax: [phone]

## 2019-04-16 MED ORDER — RABEPRAZOLE SODIUM 20 MG PO TBEC: 20.0000 mg | DELAYED_RELEASE_TABLET | Freq: Two times a day (BID) | ORAL | 3 refills | Status: DC

## 2019-04-16 NOTE — Telephone Encounter (Signed)
Rx sent in. Left a detailed message on verified voice mail.   

## 2019-04-18 DIAGNOSIS — M19041 Primary osteoarthritis, right hand: Secondary | ICD-10-CM | POA: Diagnosis not present

## 2019-04-18 DIAGNOSIS — M180 Bilateral primary osteoarthritis of first carpometacarpal joints: Secondary | ICD-10-CM | POA: Diagnosis not present

## 2019-04-18 DIAGNOSIS — M1811 Unilateral primary osteoarthritis of first carpometacarpal joint, right hand: Secondary | ICD-10-CM | POA: Diagnosis not present

## 2019-04-18 DIAGNOSIS — M1812 Unilateral primary osteoarthritis of first carpometacarpal joint, left hand: Secondary | ICD-10-CM | POA: Diagnosis not present

## 2019-06-29 DIAGNOSIS — M2021 Hallux rigidus, right foot: Secondary | ICD-10-CM | POA: Diagnosis not present

## 2019-06-29 DIAGNOSIS — M79671 Pain in right foot: Secondary | ICD-10-CM | POA: Diagnosis not present

## 2019-07-17 DIAGNOSIS — M5136 Other intervertebral disc degeneration, lumbar region: Secondary | ICD-10-CM | POA: Diagnosis not present

## 2019-07-17 DIAGNOSIS — M5412 Radiculopathy, cervical region: Secondary | ICD-10-CM | POA: Diagnosis not present

## 2019-07-17 DIAGNOSIS — Z981 Arthrodesis status: Secondary | ICD-10-CM | POA: Diagnosis not present

## 2019-07-17 DIAGNOSIS — M542 Cervicalgia: Secondary | ICD-10-CM | POA: Diagnosis not present

## 2019-07-17 DIAGNOSIS — M546 Pain in thoracic spine: Secondary | ICD-10-CM | POA: Diagnosis not present

## 2019-07-20 DIAGNOSIS — M5412 Radiculopathy, cervical region: Secondary | ICD-10-CM | POA: Diagnosis not present

## 2019-09-06 DIAGNOSIS — D225 Melanocytic nevi of trunk: Secondary | ICD-10-CM | POA: Diagnosis not present

## 2019-09-06 DIAGNOSIS — L821 Other seborrheic keratosis: Secondary | ICD-10-CM | POA: Diagnosis not present

## 2019-09-06 DIAGNOSIS — X32XXXA Exposure to sunlight, initial encounter: Secondary | ICD-10-CM | POA: Diagnosis not present

## 2019-09-06 DIAGNOSIS — L570 Actinic keratosis: Secondary | ICD-10-CM | POA: Diagnosis not present

## 2019-09-06 DIAGNOSIS — L820 Inflamed seborrheic keratosis: Secondary | ICD-10-CM | POA: Diagnosis not present

## 2019-09-25 DIAGNOSIS — Z20828 Contact with and (suspected) exposure to other viral communicable diseases: Secondary | ICD-10-CM | POA: Diagnosis not present

## 2019-09-27 DIAGNOSIS — U071 COVID-19: Secondary | ICD-10-CM | POA: Diagnosis not present

## 2019-09-27 DIAGNOSIS — J209 Acute bronchitis, unspecified: Secondary | ICD-10-CM | POA: Diagnosis not present

## 2019-09-28 DIAGNOSIS — J209 Acute bronchitis, unspecified: Secondary | ICD-10-CM | POA: Diagnosis not present

## 2019-09-28 DIAGNOSIS — U071 COVID-19: Secondary | ICD-10-CM | POA: Diagnosis not present

## 2019-10-24 ENCOUNTER — Other Ambulatory Visit: Payer: Self-pay

## 2019-10-24 MED ORDER — FENOFIBRATE 145 MG PO TABS: 145.0000 mg | ORAL_TABLET | Freq: Every evening | ORAL | 0 refills | Status: DC

## 2019-10-25 DIAGNOSIS — M19041 Primary osteoarthritis, right hand: Secondary | ICD-10-CM | POA: Diagnosis not present

## 2019-10-25 DIAGNOSIS — M79645 Pain in left finger(s): Secondary | ICD-10-CM | POA: Diagnosis not present

## 2019-10-25 DIAGNOSIS — M79644 Pain in right finger(s): Secondary | ICD-10-CM | POA: Diagnosis not present

## 2019-11-14 ENCOUNTER — Ambulatory Visit (INDEPENDENT_AMBULATORY_CARE_PROVIDER_SITE_OTHER): Payer: PPO | Admitting: Family Medicine

## 2019-11-14 ENCOUNTER — Encounter: Payer: Self-pay | Admitting: Family Medicine

## 2019-11-14 ENCOUNTER — Other Ambulatory Visit: Payer: Self-pay

## 2019-11-14 VITALS — BP 122/84 | HR 70 | Temp 98.0°F | Ht 68.5 in | Wt 182.0 lb

## 2019-11-14 DIAGNOSIS — R42 Dizziness and giddiness: Secondary | ICD-10-CM

## 2019-11-14 DIAGNOSIS — Z0000 Encounter for general adult medical examination without abnormal findings: Secondary | ICD-10-CM | POA: Diagnosis not present

## 2019-11-14 DIAGNOSIS — R739 Hyperglycemia, unspecified: Secondary | ICD-10-CM | POA: Diagnosis not present

## 2019-11-14 DIAGNOSIS — Z23 Encounter for immunization: Secondary | ICD-10-CM | POA: Diagnosis not present

## 2019-11-14 MED ORDER — TEMAZEPAM 30 MG PO CAPS: 30.0000 mg | ORAL_CAPSULE | Freq: Every evening | ORAL | 1 refills | Status: DC | PRN

## 2019-11-14 NOTE — Progress Notes (Signed)
Subjective:    Patient ID: Steven [name], male    DOB: Jan 11, 1951, 69 y.o.   MRN: [id number]  HPI Here for a well exam. He feels fine and has no concerns. Of note he was diagnosed with a Covid-19 infection on 09-28-19 at [facility]. He was treated with a Zpack, Colchicine, Hydroxychloroquine, and Pulmocort, and he received a MAB infusion. He has recovered well.    Review of Systems  Constitutional: Negative.   HENT: Negative.   Eyes: Negative.   Respiratory: Negative.   Cardiovascular: Negative.   Gastrointestinal: Negative.   Genitourinary: Negative.   Musculoskeletal: Negative.   Skin: Negative.   Neurological: Negative.   Psychiatric/Behavioral: Negative.        Objective:   Physical Exam Constitutional:      General: He is not in acute distress.    Appearance: He is well-developed. He is not diaphoretic.  HENT:     Head: Normocephalic and atraumatic.     Right Ear: External ear normal.     Left Ear: External ear normal.     Nose: Nose normal.     Mouth/Throat:     Pharynx: No oropharyngeal exudate.  Eyes:     General: No scleral icterus.       Right eye: No discharge.        Left eye: No discharge.     Conjunctiva/sclera: Conjunctivae normal.     Pupils: Pupils are equal, round, and reactive to light.  Neck:     Thyroid: No thyromegaly.     Vascular: No JVD.     Trachea: No tracheal deviation.  Cardiovascular:     Rate and Rhythm: Normal rate and regular rhythm.     Heart sounds: Normal heart sounds. No murmur heard.  No friction rub. No gallop.   Pulmonary:     Effort: Pulmonary effort is normal. No respiratory distress.     Breath sounds: Normal breath sounds. No wheezing or rales.  Chest:     Chest wall: No tenderness.  Abdominal:     General: Bowel sounds are normal. There is no distension.     Palpations: Abdomen is soft. There is no mass.     Tenderness: There is no abdominal tenderness. There is no guarding or rebound.   Genitourinary:    Penis: Normal. No tenderness.      Testes: Normal.     Prostate: Normal.     Rectum: Normal. Guaiac result negative.  Musculoskeletal:        General: No tenderness. Normal range of motion.     Cervical back: Neck supple.  Lymphadenopathy:     Cervical: No cervical adenopathy.  Skin:    General: Skin is warm and dry.     Coloration: Skin is not pale.     Findings: No erythema or rash.  Neurological:     Mental Status: He is alert and oriented to person, place, and time.     Cranial Nerves: No cranial nerve deficit.     Motor: No abnormal muscle tone.     Coordination: Coordination normal.     Deep Tendon Reflexes: Reflexes are normal and symmetric. Reflexes normal.  Psychiatric:        Behavior: Behavior normal.        Thought Content: Thought content normal.        Judgment: Judgment normal.           Assessment & Plan:  Well exam. We discussed diet and exercise.  Get fasting labs.  Alysia Penna, MD

## 2019-11-15 LAB — BASIC METABOLIC PANEL WITH GFR
BUN/Creatinine Ratio: 13 (calc) (ref 6–22)
BUN: 18 mg/dL (ref 7–25)
CO2: 29 mmol/L (ref 20–32)
Calcium: 10.2 mg/dL (ref 8.6–10.3)
Chloride: 104 mmol/L (ref 98–110)
Creat: 1.36 mg/dL — ABNORMAL HIGH (ref 0.70–1.18)
GFR, Est African American: 60 mL/min/{1.73_m2} (ref >=60)
GFR, Est Non African American: 52 mL/min/{1.73_m2} — ABNORMAL LOW (ref >=60)
Glucose, Bld: 92 mg/dL (ref 65–99)
Potassium: 4.6 mmol/L (ref 3.5–5.3)
Sodium: 141 mmol/L (ref 135–146)

## 2019-11-15 LAB — CBC WITH DIFFERENTIAL/PLATELET
Absolute Monocytes: 460 cells/uL (ref 200–950)
Basophils Absolute: 28 cells/uL (ref 0–200)
Basophils Relative: 0.6 %
Eosinophils Absolute: 101 cells/uL (ref 15–500)
Eosinophils Relative: 2.2 %
HCT: 46.3 % (ref 38.5–50.0)
Hemoglobin: 15.8 g/dL (ref 13.2–17.1)
Lymphs Abs: 1306 cells/uL (ref 850–3900)
MCH: 30.5 pg (ref 27.0–33.0)
MCHC: 34.1 g/dL (ref 32.0–36.0)
MCV: 89.4 fL (ref 80.0–100.0)
MPV: 9 fL (ref 7.5–12.5)
Monocytes Relative: 10 %
Neutro Abs: 2705 cells/uL (ref 1500–7800)
Neutrophils Relative %: 58.8 %
Platelets: 235 10*3/uL (ref 140–400)
RBC: 5.18 10*6/uL (ref 4.20–5.80)
RDW: 13.9 % (ref 11.0–15.0)
Total Lymphocyte: 28.4 %
WBC: 4.6 10*3/uL (ref 3.8–10.8)

## 2019-11-15 LAB — HEMOGLOBIN A1C
Hgb A1c MFr Bld: 5.6 % of total Hgb (ref <5.7)
Mean Plasma Glucose: 114 (calc)
eAG (mmol/L): 6.3 (calc)

## 2019-11-15 LAB — TSH: TSH: 2.06 mIU/L (ref 0.40–4.50)

## 2019-11-15 LAB — HEPATIC FUNCTION PANEL
AG Ratio: 2 (calc) (ref 1.0–2.5)
ALT: 30 U/L (ref 9–46)
AST: 29 U/L (ref 10–35)
Albumin: 4.9 g/dL (ref 3.6–5.1)
Alkaline phosphatase (APISO): 58 U/L (ref 35–144)
Bilirubin, Direct: 0.2 mg/dL (ref 0.0–0.2)
Globulin: 2.5 g/dL (calc) (ref 1.9–3.7)
Indirect Bilirubin: 0.8 mg/dL (calc) (ref 0.2–1.2)
Total Bilirubin: 1 mg/dL (ref 0.2–1.2)
Total Protein: 7.4 g/dL (ref 6.1–8.1)

## 2019-11-15 LAB — LIPID PANEL
Cholesterol: 195 mg/dL (ref <200)
HDL: 48 mg/dL (ref >=40)
LDL Cholesterol (Calc): 121 mg/dL (calc) — ABNORMAL HIGH
Non-HDL Cholesterol (Calc): 147 mg/dL (calc) — ABNORMAL HIGH (ref <130)
Total CHOL/HDL Ratio: 4.1 (calc) (ref <5.0)
Triglycerides: 144 mg/dL (ref <150)

## 2019-11-15 LAB — PSA: PSA: 0.55 ng/mL (ref <=4.0)

## 2019-11-21 ENCOUNTER — Telehealth: Payer: Self-pay | Admitting: Family Medicine

## 2019-11-21 NOTE — Telephone Encounter (Signed)
Patient wife is calling and wanted to speak to someone regarding patients lab results and also wife is requesting results be mailed to the address on file, please advise. CB is [phone]

## 2019-11-22 NOTE — Telephone Encounter (Signed)
Went over labs with patient and his wife. Printed off labs to be mailed to the home.

## 2019-11-26 ENCOUNTER — Telehealth: Payer: Self-pay

## 2019-11-26 NOTE — Telephone Encounter (Signed)
Patients wife called in asking if Dr would send a referral to Cardiology for the patient. He has been having pain in his chest on the left side. When he bends down he gets dizzy and also has a family history of heart issues    [facility]  Dr. Donnella Bi  [phone]  Patient has already contacted the office and they are wiling to see the patient when referral is sent can it be sent as urgent so he can get appt this month via front office of the referring office

## 2019-11-27 NOTE — Telephone Encounter (Signed)
Please advise if okay to send referral and send back to team care pool

## 2019-11-28 NOTE — Telephone Encounter (Signed)
I did the referral to Dr. Gwenlyn Found

## 2019-11-28 NOTE — Addendum Note (Signed)
Addended by: Alysia Penna A on: 11/28/2019 10:10 AM   Modules accepted: Orders

## 2019-11-28 NOTE — Addendum Note (Signed)
Addended by: Alysia Penna A on: 11/28/2019 10:48 AM   Modules accepted: Orders

## 2019-12-10 ENCOUNTER — Encounter: Payer: Self-pay | Admitting: Family Medicine

## 2019-12-10 ENCOUNTER — Ambulatory Visit (INDEPENDENT_AMBULATORY_CARE_PROVIDER_SITE_OTHER): Payer: PPO | Admitting: Family Medicine

## 2019-12-10 ENCOUNTER — Other Ambulatory Visit: Payer: Self-pay

## 2019-12-10 VITALS — BP 130/70 | HR 71 | Temp 98.2°F | Resp 16 | Wt 190.0 lb

## 2019-12-10 DIAGNOSIS — R0789 Other chest pain: Secondary | ICD-10-CM

## 2019-12-10 DIAGNOSIS — R300 Dysuria: Secondary | ICD-10-CM

## 2019-12-10 DIAGNOSIS — B029 Zoster without complications: Secondary | ICD-10-CM | POA: Diagnosis not present

## 2019-12-10 LAB — POCT URINALYSIS DIPSTICK
Bilirubin, UA: NEGATIVE
Blood, UA: NEGATIVE
Glucose, UA: NEGATIVE
Ketones, UA: NEGATIVE
Leukocytes, UA: NEGATIVE
Nitrite, UA: NEGATIVE
Protein, UA: NEGATIVE
Spec Grav, UA: 1.015 (ref 1.010–1.025)
Urobilinogen, UA: 0.2 E.U./dL
pH, UA: 6.5 (ref 5.0–8.0)

## 2019-12-10 MED ORDER — METHYLPREDNISOLONE 4 MG PO TBPK: ORAL_TABLET | ORAL | 0 refills | Status: DC

## 2019-12-10 MED ORDER — VALACYCLOVIR HCL 1 G PO TABS: 1000.0000 mg | ORAL_TABLET | Freq: Three times a day (TID) | ORAL | 0 refills | Status: DC

## 2019-12-10 MED ORDER — OXYCODONE-ACETAMINOPHEN 5-325 MG PO TABS: 1.0000 | ORAL_TABLET | ORAL | 0 refills | Status: AC | PRN

## 2019-12-10 NOTE — Progress Notes (Signed)
   Subjective:    Patient ID: Steven [name], male    DOB: December 11, 1950, 69 y.o.   MRN: [id number]  HPI Here for one week of a severe sharp pain that starts in the right middle back and runs around the right flank to the lower abdomen. His skin is very sensitive to the touch. A few days ago a rash appeared on the back.    Review of Systems  Constitutional: Negative.   Respiratory: Negative.   Cardiovascular: Negative.   Skin: Positive for rash.       Objective:   Physical Exam Constitutional:      Appearance: Normal appearance.  Cardiovascular:     Rate and Rhythm: Normal rate and regular rhythm.     Pulses: Normal pulses.     Heart sounds: Normal heart sounds.  Pulmonary:     Effort: Pulmonary effort is normal.     Breath sounds: Normal breath sounds.  Skin:    Comments: There is a patch of red vesicles on the right lower back   Neurological:     Mental Status: He is alert.           Assessment & Plan:  Shingles, treat with Valtrex for 10 days and a Medrol dose pack. Use Percocet as needed for pain. Alysia Penna, MD

## 2020-01-14 ENCOUNTER — Other Ambulatory Visit: Payer: Self-pay | Admitting: *Deleted

## 2020-01-14 MED ORDER — EZETIMIBE 10 MG PO TABS: 10.0000 mg | ORAL_TABLET | Freq: Every day | ORAL | 1 refills | Status: DC

## 2020-01-15 MED ORDER — RIVAROXABAN 20 MG PO TABS: 20.0000 mg | ORAL_TABLET | Freq: Every day | ORAL | 3 refills | Status: DC

## 2020-01-16 ENCOUNTER — Telehealth (INDEPENDENT_AMBULATORY_CARE_PROVIDER_SITE_OTHER): Payer: PPO | Admitting: Family Medicine

## 2020-01-16 ENCOUNTER — Encounter: Payer: Self-pay | Admitting: Family Medicine

## 2020-01-16 VITALS — Ht 68.0 in | Wt 188.0 lb

## 2020-01-16 DIAGNOSIS — E785 Hyperlipidemia, unspecified: Secondary | ICD-10-CM | POA: Diagnosis not present

## 2020-01-16 DIAGNOSIS — I10 Essential (primary) hypertension: Secondary | ICD-10-CM | POA: Diagnosis not present

## 2020-01-16 DIAGNOSIS — K219 Gastro-esophageal reflux disease without esophagitis: Secondary | ICD-10-CM | POA: Diagnosis not present

## 2020-01-16 MED ORDER — DILTIAZEM HCL ER 60 MG PO CP12: 60.0000 mg | ORAL_CAPSULE | Freq: Every day | ORAL | 3 refills | Status: DC

## 2020-01-16 MED ORDER — FENOFIBRATE 145 MG PO TABS: 145.0000 mg | ORAL_TABLET | Freq: Every evening | ORAL | 3 refills | Status: DC

## 2020-01-16 MED ORDER — LOSARTAN POTASSIUM 50 MG PO TABS: 50.0000 mg | ORAL_TABLET | Freq: Every day | ORAL | 3 refills | Status: DC

## 2020-01-16 NOTE — Progress Notes (Signed)
   Subjective:    Patient ID: Steven [name], male    DOB: 03-28-1950, 70 y.o.   MRN: [id number]  HPI Virtual Visit via Telephone Note  I connected with the patient on 01/16/20 at  9:00 AM EST by telephone and verified that I am speaking with the correct person using two identifiers.   I discussed the limitations, risks, security and privacy concerns of performing an evaluation and management service by telephone and the availability of in person appointments. I also discussed with the patient that there may be a patient responsible charge related to this service. The patient expressed understanding and agreed to proceed.  Location patient: home Location provider: work or home office Participants present for the call: patient, provider Patient did not have a visit in the prior 7 days to address this/these issue(s).   History of Present Illness: Here for medication refills. He has felt fine and his BP has been stable at home.    Observations/Objective: Patient sounds cheerful and well on the phone. I do not appreciate any SOB. Speech and thought processing are grossly intact. Patient reported vitals:  Assessment and Plan: He is doing well. HTN and GERD are controlled. Medications were refilled. Gershon Crane, MD   Follow Up Instructions:     [phone] 5-10 [phone] 11-20 [id number] 21-30 I did not refer this patient for an OV in the next 24 hours for this/these issue(s).  I discussed the assessment and treatment plan with the patient. The patient was provided an opportunity to ask questions and all were answered. The patient agreed with the plan and demonstrated an understanding of the instructions.   The patient was advised to call back or seek an in-person evaluation if the symptoms worsen or if the condition fails to improve as anticipated.  I provided 12 minutes of non-face-to-face time during this encounter.   Gershon Crane, MD    Review of Systems     Objective:   Physical  Exam        Assessment & Plan:

## 2020-01-17 ENCOUNTER — Telehealth: Payer: Self-pay | Admitting: Family Medicine

## 2020-01-17 NOTE — Telephone Encounter (Signed)
Pt spouse is calling in stating that they are a little concerned about a Rx that was sent in on 01/17/2020 (diltiazem (CARDIZEM SR) 60 MG they are wanting to know if this was replaced by another Rx.  Spouse stated that she told the pharmacy to not fill and they would like to have a call back.   Pts spouse stated that they ordered the following Rx's on 01/16/2020: ezetimibe (ZETIA) 10 MG, febifubrate (TRICOR) 145 MG and losartan (COZAAR) 50 MG and she also stated that they ordered Rx rivaroxaban (XARELTO) 20 MG was ordered on 01/15/2020.

## 2020-01-18 NOTE — Telephone Encounter (Signed)
Patient is no longer taking the medication.  Medication list updated.

## 2020-01-18 NOTE — Telephone Encounter (Signed)
The Diltiazem was started by Dr. Oretha Caprice (GI) in Nov. 2020 to help with esophageal spasms. I just refilled this the other day since I assumed Steven [name] is still taking it

## 2020-01-18 NOTE — Telephone Encounter (Signed)
Left message on machine for patient to return our call 

## 2020-01-23 ENCOUNTER — Encounter: Payer: Self-pay | Admitting: Family Medicine

## 2020-01-23 DIAGNOSIS — H43813 Vitreous degeneration, bilateral: Secondary | ICD-10-CM | POA: Diagnosis not present

## 2020-01-23 DIAGNOSIS — H40013 Open angle with borderline findings, low risk, bilateral: Secondary | ICD-10-CM | POA: Diagnosis not present

## 2020-01-23 DIAGNOSIS — H2513 Age-related nuclear cataract, bilateral: Secondary | ICD-10-CM | POA: Diagnosis not present

## 2020-01-23 DIAGNOSIS — G43809 Other migraine, not intractable, without status migrainosus: Secondary | ICD-10-CM | POA: Diagnosis not present

## 2020-01-23 DIAGNOSIS — H11042 Peripheral pterygium, stationary, left eye: Secondary | ICD-10-CM | POA: Diagnosis not present

## 2020-01-24 NOTE — Progress Notes (Signed)
Key: [id number] help? Call us at [phone] Outcome Additional Information Required Available without authorization. Drug Xarelto 20MG  tablets Form [employer] 4-Part Electronic PA Form [phone] NCPDP)

## 2020-01-25 ENCOUNTER — Ambulatory Visit: Payer: PPO | Admitting: Cardiovascular Disease

## 2020-01-25 ENCOUNTER — Encounter: Payer: Self-pay | Admitting: Cardiovascular Disease

## 2020-01-25 ENCOUNTER — Other Ambulatory Visit: Payer: Self-pay

## 2020-01-25 VITALS — BP 126/80 | HR 63 | Ht 68.5 in | Wt 192.0 lb

## 2020-01-25 DIAGNOSIS — R079 Chest pain, unspecified: Secondary | ICD-10-CM | POA: Diagnosis not present

## 2020-01-25 DIAGNOSIS — R42 Dizziness and giddiness: Secondary | ICD-10-CM

## 2020-01-25 DIAGNOSIS — I10 Essential (primary) hypertension: Secondary | ICD-10-CM | POA: Diagnosis not present

## 2020-01-25 DIAGNOSIS — H539 Unspecified visual disturbance: Secondary | ICD-10-CM | POA: Diagnosis not present

## 2020-01-25 DIAGNOSIS — E782 Mixed hyperlipidemia: Secondary | ICD-10-CM | POA: Diagnosis not present

## 2020-01-25 DIAGNOSIS — I82409 Acute embolism and thrombosis of unspecified deep veins of unspecified lower extremity: Secondary | ICD-10-CM

## 2020-01-25 MED ORDER — METOPROLOL TARTRATE 50 MG PO TABS: 50.0000 mg | ORAL_TABLET | Freq: Once | ORAL | 0 refills | Status: DC

## 2020-01-25 NOTE — Assessment & Plan Note (Signed)
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

## 2020-01-25 NOTE — Patient Instructions (Signed)
Medication Instructions:  Your physician recommends that you continue on your current medications as directed. Please refer to the Current Medication list given to you today.  *If you need a refill on your cardiac medications before your next appointment, please call your pharmacy*   Lab Work: Your physician recommends that you return for lab work in: BMET- when coronary CTA is scheduled.  If you have labs (blood work) drawn today and your tests are completely normal, you will receive your results only by: Marland Kitchen MyChart Message (if you have MyChart) OR . A paper copy in the mail If you have any lab test that is abnormal or we need to change your treatment, we will call you to review the results.   Testing/Procedures: Your cardiac CT will be scheduled at one of the below locations:   [facility] [address] [place], [place] [address] [phone]  If scheduled at [facility], please arrive at the [facility] main entrance of [facility] 30 minutes prior to test start time. Proceed to the [facility] Radiology Department (first floor) to check-in and test prep.  Please follow these instructions carefully (unless otherwise directed):  Hold all erectile dysfunction medications at least 3 days (72 hrs) prior to test.  On the Night Before the Test: . Be sure to Drink plenty of water. . Do not consume any caffeinated/decaffeinated beverages or chocolate 12 hours prior to your test. . Do not take any antihistamines 12 hours prior to your test.  On the Day of the Test: . Drink plenty of water. Do not drink any water within one hour of the test. . Do not eat any food 4 hours prior to the test. . You may take your regular medications prior to the test.  . Take metoprolol (Lopressor) two hours prior to test. . HOLD Furosemide/Hydrochlorothiazide morning of the test. . FEMALES- please wear underwire-free bra if available   *For Clinical Staff only. Please  instruct patient the following:*        -Drink plenty of water       -Hold Furosemide/hydrochlorothiazide morning of the test       -Take metoprolol (Lopressor)73m  2 hours prior to test (if applicable).                 After the Test: . Drink plenty of water. . After receiving IV contrast, you may experience a mild flushed feeling. This is normal. . On occasion, you may experience a mild rash up to 24 hours after the test. This is not dangerous. If this occurs, you can take Benadryl 25 mg and increase your fluid intake. . If you experience trouble breathing, this can be serious. If it is severe call 911 IMMEDIATELY. If it is mild, please call our office. . If you take any of these medications: Glipizide/Metformin, Avandament, Glucavance, please do not take 48 hours after completing test unless otherwise instructed.   Once we have confirmed authorization from your insurance company, we will call you to set up a date and time for your test. Based on how quickly your insurance processes prior authorizations requests, please allow up to 4 weeks to be contacted for scheduling your Cardiac CT appointment. Be advised that routine Cardiac CT appointments could be scheduled as many as 8 weeks after your provider has ordered it.  For non-scheduling related questions, please contact the cardiac imaging nurse navigator should you have any questions/concerns: SMarchia Bond Cardiac Imaging Nurse Navigator MBurley Saver  Interim Cardiac Imaging Nurse [place] [facility] Direct Office Dial: [phone]   For scheduling needs, including cancellations and rescheduling, please call [place], [phone].  Your physician has requested that you have an echocardiogram. Echocardiography is a painless test that uses sound waves to create images of your heart. It provides your doctor with information about the size and shape of your heart and how well your heart's chambers and valves are  working. This procedure takes approximately one hour. There are no restrictions for this procedure. [place]. [place] physician has requested that you have a carotid duplex. This test is an ultrasound of the carotid arteries in your neck. It looks at blood flow through these arteries that supply the brain with blood. Allow one hour for this exam. There are no restrictions or special instructions. [place]. 2nd Floor   Follow-Up: At [facility], you and your health needs are our priority.  As part of our continuing mission to provide you with exceptional heart care, we have created designated Provider Care Teams.  These Care Teams include your primary Cardiologist (physician) and Advanced Practice Providers (APPs -  Physician Assistants and Nurse Practitioners) who all work together to provide you with the care you need, when you need it.  We recommend signing up for the patient portal called "MyChart".  Sign up information is provided on this After Visit Summary.  MyChart is used to connect with patients for Virtual Visits (Telemedicine).  Patients are able to view lab/test results, encounter notes, upcoming appointments, etc.  Non-urgent messages can be sent to your provider as well.   To learn more about what you can do with MyChart, go to [email or web address].    Your next appointment:   2 month(s)  The format for your next appointment:   In Person  Provider:   Quay Burow, MD

## 2020-01-25 NOTE — Assessment & Plan Note (Signed)
History of hyperlipidemia on Zetia and fenofibrate with lipid profile performed 11/14/2019 revealing total cholesterol 195, LDL of 121 and HDL 48.  Apparently he has tried statins in the past and has had some intolerance symptoms.

## 2020-01-25 NOTE — Assessment & Plan Note (Signed)
History of essential hypertension a blood pressure measured today 126/80.  He is on losartan.

## 2020-01-25 NOTE — Assessment & Plan Note (Signed)
Steven [name] is referred for atypical chest pain.  Apparently his brother is also a patient of mine.  His risk factors include treated hypertension and hyperlipidemia.  He has a strong family history of heart disease including a father who had CABG in his 72s and 2 brothers who have had ischemic heart disease as well.  He apparently had a remote cardiac catheterization 15 to 20 years ago and negative Myoview stress test in 2015 during admission for chest pain rule out myocardial infarction.  He says he gets chest pain several times a month not associated with any particular activity lasting for seconds to minutes at a time.  Given his strong family history I am going to get a coronary CTA to further evaluate

## 2020-01-25 NOTE — Progress Notes (Signed)
01/25/2020 Steven [name]   [birth year]  [id number]  Primary Physician Steven Morale, MD Primary Cardiologist: Steven Harp MD Steven [name], [place]  HPI:  Steven [name] is a 70 y.o. mildly overweight married Caucasian male father of 3 children, grandfather 2 grandchildren who is accompanied by his wife Steven [name] today.  He was referred by his PCP, Steven [name], for cardiovascular valuation because of chest pain.  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  His risk factors include treated hypertension, hyperlipidemia and strong family history for heart disease with father and 2 brothers all of whom had ischemic heart disease.  He is never had a heart attack or stroke.  He apparently had a normal cardiac cath many years ago and a negative Myoview stress test in 2015 during an admission for chest pains/rule out myocardial infarction.  He gets chest pain several times a month that occur randomly without association with any particular activity.   Current Meds  Medication Sig  . acyclovir cream (ZOVIRAX) 5 % Apply 1 application topically daily as needed (fever blisters).  . clobetasol cream (TEMOVATE) 6.96 % Apply 1 application topically daily as needed (herpes).   . eletriptan (RELPAX) 20 MG tablet Take 1 tablet (20 mg total) by mouth as needed for migraine.  . ezetimibe (ZETIA) 10 MG tablet Take 1 tablet (10 mg total) by mouth daily.  . fenofibrate (TRICOR) 145 MG tablet Take 1 tablet (145 mg total) by mouth every evening.  . folic acid (FOLVITE) 1 MG tablet Take 1 mg by mouth daily.  . hydrocortisone-pramoxine (PROCTOFOAM HC) rectal foam Place 1 applicator rectally 2 (two) times daily.  Marland Kitchen losartan (COZAAR) 50 MG tablet Take 1 tablet (50 mg total) by mouth daily.  . RABEprazole (ACIPHEX) 20 MG tablet Take 1 tablet (20 mg total) by mouth 2 (two) times daily.  . rivaroxaban (XARELTO) 20 MG TABS tablet Take 1 tablet (20 mg total) by mouth daily with supper.  . temazepam (RESTORIL) 30 MG  capsule Take 1 capsule (30 mg total) by mouth at bedtime as needed for sleep.  . valACYclovir (VALTREX) 1000 MG tablet Take 1 tablet (1,000 mg total) by mouth 3 (three) times daily.  . [DISCONTINUED] folic acid (FOLVITE) 295 MCG tablet Take 400 mcg by mouth daily.  . [DISCONTINUED] methylPREDNISolone (MEDROL DOSEPAK) 4 MG TBPK tablet As directed     Allergies  Allergen Reactions  . Morphine Other (See Comments)    headache  . Amoxicillin Nausea Only  . Amitriptyline Hcl Other (See Comments)    Mood changes  . Amoxicillin-Pot Clavulanate     REACTION: nausea  . Cyclobenzaprine Hcl     unknown  . Levofloxacin Swelling  . Lisinopril-Hydrochlorothiazide     depression    Social History   Socioeconomic History  . Marital status: Married    Spouse name: Not on file  . Number of children: Not on file  . Years of education: Not on file  . Highest education level: Not on file  Occupational History  . Occupation: Retired  Tobacco Use  . Smoking status: Former Smoker    Packs/day: 1.50    Years: 20.00    Pack years: 30.00    Types: Cigarettes    Quit date: 03/03/1973    Years since quitting: 46.9  . Smokeless tobacco: Never Used  . Tobacco comment: discussed AAA; did have CT of abd/ pelvis   Substance and Sexual Activity  . Alcohol use: No  Alcohol/week: 0.0 standard drinks  . Drug use: No  . Sexual activity: Not on file  Other Topics Concern  . Not on file  Social History Narrative  . Not on file   Social Determinants of Health   Financial Resource Strain: Not on file  Food Insecurity: Not on file  Transportation Needs: Not on file  Physical Activity: Not on file  Stress: Not on file  Social Connections: Not on file  Intimate Partner Violence: Not on file     Review of Systems: General: negative for chills, fever, night sweats or weight changes.  Cardiovascular: negative for chest pain, dyspnea on exertion, edema, orthopnea, palpitations, paroxysmal nocturnal  dyspnea or shortness of breath Dermatological: negative for rash Respiratory: negative for cough or wheezing Urologic: negative for hematuria Abdominal: negative for nausea, vomiting, diarrhea, bright red blood per rectum, melena, or hematemesis Neurologic: negative for visual changes, syncope, or dizziness All other systems reviewed and are otherwise negative except as noted above.    Blood pressure 126/80, pulse 63, height 5' 8.5" (1.74 m), weight 192 lb (87.1 kg).  General appearance: alert and no distress Neck: no adenopathy, no carotid bruit, no JVD, supple, symmetrical, trachea midline and thyroid not enlarged, symmetric, no tenderness/mass/nodules Lungs: clear to auscultation bilaterally Heart: regular rate and rhythm, S1, S2 normal, no murmur, click, rub or gallop Extremities: extremities normal, atraumatic, no cyanosis or edema Pulses: 2+ and symmetric Skin: Skin color, texture, turgor normal. No rashes or lesions Neurologic: Alert and oriented X 3, normal strength and tone. Normal symmetric reflexes. Normal coordination and gait  EKG sinus rhythm at 63 with incomplete right bundle branch block.  I personally reviewed this EKG.  ASSESSMENT AND PLAN:   Hyperlipidemia History of hyperlipidemia on Zetia and fenofibrate with lipid profile performed 11/14/2019 revealing total cholesterol 195, LDL of 121 and HDL 48.  Apparently he has tried statins in the past and has had some intolerance symptoms.  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  HTN (hypertension) History of essential hypertension a blood pressure measured today 126/80.  He is on losartan.  Atypical chest pain Mr. Mcniel is referred for atypical chest pain.  Apparently his brother is also a patient of mine.  His risk factors include treated hypertension and hyperlipidemia.  He has a strong family history of heart disease including a father  who had CABG in his 43s and 2 brothers who have had ischemic heart disease as well.  He apparently had a remote cardiac catheterization 15 to 20 years ago and negative Myoview stress test in 2015 during admission for chest pain rule out myocardial infarction.  He says he gets chest pain several times a month not associated with any particular activity lasting for seconds to minutes at a time.  Given his strong family history I am going to get a coronary CTA to further evaluate      Steven Harp MD [facility], [facility] 01/25/2020 2:47 PM

## 2020-02-01 ENCOUNTER — Telehealth (HOSPITAL_COMMUNITY): Payer: Self-pay | Admitting: Emergency Medicine

## 2020-02-01 ENCOUNTER — Ambulatory Visit (HOSPITAL_COMMUNITY)
Admission: RE | Admit: 2020-02-01 | Discharge: 2020-02-01 | Disposition: A | Discharge status: Home / Self Care | Payer: PPO | Source: Ambulatory Visit | Attending: Cardiology | Admitting: Cardiology

## 2020-02-01 ENCOUNTER — Other Ambulatory Visit: Payer: Self-pay

## 2020-02-01 DIAGNOSIS — I10 Essential (primary) hypertension: Secondary | ICD-10-CM | POA: Diagnosis not present

## 2020-02-01 DIAGNOSIS — R079 Chest pain, unspecified: Secondary | ICD-10-CM | POA: Diagnosis not present

## 2020-02-01 DIAGNOSIS — H539 Unspecified visual disturbance: Secondary | ICD-10-CM | POA: Diagnosis not present

## 2020-02-01 DIAGNOSIS — R42 Dizziness and giddiness: Secondary | ICD-10-CM | POA: Insufficient documentation

## 2020-02-01 NOTE — Telephone Encounter (Signed)
Attempted to call patient regarding upcoming cardiac CT appointment. Left message on voicemail with name and callback number Steven Bond RN Navigator Cardiac Imaging [facility] [phone] Office [phone] Cell  Left detailed message requesting he go by the lab at [facility] while there for carotid doppler US. Sent message to [name] tech to let them know also.   [name]

## 2020-02-02 LAB — BASIC METABOLIC PANEL
BUN/Creatinine Ratio: 11 (ref 10–24)
BUN: 17 mg/dL (ref 8–27)
CO2: 24 mmol/L (ref 20–29)
Calcium: 10.1 mg/dL (ref 8.6–10.2)
Chloride: 104 mmol/L (ref 96–106)
Creatinine, Ser: 1.48 mg/dL — ABNORMAL HIGH (ref 0.76–1.27)
GFR calc Af Amer: 54 mL/min/{1.73_m2} — ABNORMAL LOW (ref >59)
GFR calc non Af Amer: 47 mL/min/{1.73_m2} — ABNORMAL LOW (ref >59)
Glucose: 77 mg/dL (ref 65–99)
Potassium: 4.8 mmol/L (ref 3.5–5.2)
Sodium: 142 mmol/L (ref 134–144)

## 2020-02-04 ENCOUNTER — Telehealth (HOSPITAL_COMMUNITY): Payer: Self-pay | Admitting: Emergency Medicine

## 2020-02-04 NOTE — Telephone Encounter (Signed)
Reaching out to patient to offer assistance regarding upcoming cardiac imaging study; pt verbalizes understanding of appt date/time, parking situation and where to check in, pre-test NPO status and medications ordered, and verified current allergies; name and call back number provided for further questions should they arise Mckinzee Spirito RN Navigator Cardiac Imaging [facility] [phone] office [phone] cell 

## 2020-02-05 ENCOUNTER — Other Ambulatory Visit: Payer: Self-pay

## 2020-02-05 ENCOUNTER — Ambulatory Visit (HOSPITAL_COMMUNITY)
Admission: RE | Admit: 2020-02-05 | Discharge: 2020-02-05 | Disposition: A | Discharge status: Home / Self Care | Payer: PPO | Source: Ambulatory Visit | Attending: Cardiovascular Disease | Admitting: Cardiovascular Disease

## 2020-02-05 DIAGNOSIS — R079 Chest pain, unspecified: Secondary | ICD-10-CM | POA: Insufficient documentation

## 2020-02-05 MED ORDER — NITROGLYCERIN 0.4 MG SL SUBL
SUBLINGUAL_TABLET | SUBLINGUAL | Status: AC
  Filled 2020-02-05: qty 2

## 2020-02-05 MED ORDER — IOHEXOL 350 MG/ML SOLN
80.0000 mL | Freq: Once | INTRAVENOUS | Status: AC | PRN
  Administered 2020-02-05: 80 mL via INTRAVENOUS

## 2020-02-05 MED ORDER — NITROGLYCERIN 0.4 MG SL SUBL
0.8000 mg | SUBLINGUAL_TABLET | Freq: Once | SUBLINGUAL | Status: AC | PRN
  Administered 2020-02-05: 0.8 mg via SUBLINGUAL

## 2020-02-14 ENCOUNTER — Ambulatory Visit (HOSPITAL_COMMUNITY): Payer: PPO | Attending: Cardiology

## 2020-02-14 ENCOUNTER — Other Ambulatory Visit: Payer: Self-pay

## 2020-02-14 DIAGNOSIS — R079 Chest pain, unspecified: Secondary | ICD-10-CM | POA: Diagnosis not present

## 2020-02-14 DIAGNOSIS — E782 Mixed hyperlipidemia: Secondary | ICD-10-CM | POA: Diagnosis not present

## 2020-02-14 DIAGNOSIS — I10 Essential (primary) hypertension: Secondary | ICD-10-CM

## 2020-02-14 LAB — ECHOCARDIOGRAM COMPLETE
Area-P 1/2: 3.14 cm2
S' Lateral: 2 cm

## 2020-02-26 DIAGNOSIS — M7541 Impingement syndrome of right shoulder: Secondary | ICD-10-CM | POA: Diagnosis not present

## 2020-02-28 ENCOUNTER — Telehealth: Payer: Self-pay | Admitting: Family Medicine

## 2020-02-28 NOTE — Telephone Encounter (Signed)
Spoke with patient he stated he would call back to  schedule Medicare Annual Wellness Visit (AWV) either virtually or in office.   Last AWV 05/28/17  please schedule at anytime with [facility] Nurse Health Advisor 1 or 2   This should be a 45 minute visit.

## 2020-03-14 DIAGNOSIS — Z981 Arthrodesis status: Secondary | ICD-10-CM | POA: Diagnosis not present

## 2020-03-14 DIAGNOSIS — M542 Cervicalgia: Secondary | ICD-10-CM | POA: Diagnosis not present

## 2020-03-14 DIAGNOSIS — M5030 Other cervical disc degeneration, unspecified cervical region: Secondary | ICD-10-CM | POA: Diagnosis not present

## 2020-03-24 DIAGNOSIS — N200 Calculus of kidney: Secondary | ICD-10-CM | POA: Diagnosis not present

## 2020-03-24 DIAGNOSIS — N400 Enlarged prostate without lower urinary tract symptoms: Secondary | ICD-10-CM | POA: Diagnosis not present

## 2020-03-24 DIAGNOSIS — R311 Benign essential microscopic hematuria: Secondary | ICD-10-CM | POA: Diagnosis not present

## 2020-03-26 ENCOUNTER — Ambulatory Visit: Payer: PPO | Admitting: Cardiovascular Disease

## 2020-03-26 ENCOUNTER — Encounter: Payer: Self-pay | Admitting: Cardiovascular Disease

## 2020-03-26 ENCOUNTER — Other Ambulatory Visit: Payer: Self-pay

## 2020-03-26 VITALS — BP 124/76 | HR 60 | Ht 69.5 in | Wt 192.0 lb

## 2020-03-26 DIAGNOSIS — R0789 Other chest pain: Secondary | ICD-10-CM

## 2020-03-26 NOTE — Progress Notes (Signed)
Steven [name] returns today for follow-up of his outpatient diagnostic test performed in January for evaluation of atypical chest pain.  His 2D echo was normal.  His carotid Dopplers were normal as well.  His coronary calcium score was 0 without evidence of CAD.  The chest pain which he was experiencing has resolved.  He thinks this was related to stress in his family.  I have reassured him.  I will see him back in 1 year for follow-up.  Lorretta Harp, M.D., [place], [facility], [place] [address] [place], [place]  [address]  [phone] 03/26/2020 10:43 AM

## 2020-03-26 NOTE — Patient Instructions (Signed)

## 2020-03-28 ENCOUNTER — Telehealth: Payer: PPO

## 2020-04-01 DIAGNOSIS — M5412 Radiculopathy, cervical region: Secondary | ICD-10-CM | POA: Diagnosis not present

## 2020-04-07 DIAGNOSIS — Z981 Arthrodesis status: Secondary | ICD-10-CM | POA: Diagnosis not present

## 2020-04-09 ENCOUNTER — Telehealth: Payer: Self-pay | Admitting: Family Medicine

## 2020-04-09 DIAGNOSIS — M47812 Spondylosis without myelopathy or radiculopathy, cervical region: Secondary | ICD-10-CM | POA: Diagnosis not present

## 2020-04-09 DIAGNOSIS — Z981 Arthrodesis status: Secondary | ICD-10-CM | POA: Diagnosis not present

## 2020-04-09 DIAGNOSIS — M961 Postlaminectomy syndrome, not elsewhere classified: Secondary | ICD-10-CM | POA: Diagnosis not present

## 2020-04-09 NOTE — Telephone Encounter (Signed)
Pts spouse is calling in stating that Dr. Nelva Bush has spoke with Dr. Sarajane Jews about the pt going off of Rx rivaroxaban (XARELTO) 20 MG for 3-5 day and the pt is wanting to know the risk factor of him being off of the medication for that time period.  Pts spouse would like to have a call back.

## 2020-04-10 NOTE — Telephone Encounter (Signed)
Spoke with Steven [name] and wife advised that Dr Sarajane Jews advise for Steven [name] to stay off Xarelto medication for 3 days and no more than that prior to procedure.Steven [name] Verbalized understanding

## 2020-04-10 NOTE — Telephone Encounter (Signed)
I think he can stay off the med for 3 days (no more than that) prior to the procedure. The risk of having a blood clot form during that time is quite low.

## 2020-04-10 NOTE — Telephone Encounter (Signed)
Please advise 

## 2020-04-22 ENCOUNTER — Encounter: Payer: Self-pay | Admitting: Family Medicine

## 2020-04-22 ENCOUNTER — Other Ambulatory Visit: Payer: Self-pay

## 2020-04-22 ENCOUNTER — Ambulatory Visit (INDEPENDENT_AMBULATORY_CARE_PROVIDER_SITE_OTHER): Payer: PPO | Admitting: Family Medicine

## 2020-04-22 VITALS — BP 118/76 | HR 70 | Temp 98.5°F | Wt 191.2 lb

## 2020-04-22 DIAGNOSIS — H11042 Peripheral pterygium, stationary, left eye: Secondary | ICD-10-CM | POA: Diagnosis not present

## 2020-04-22 DIAGNOSIS — H43812 Vitreous degeneration, left eye: Secondary | ICD-10-CM | POA: Diagnosis not present

## 2020-04-22 DIAGNOSIS — J40 Bronchitis, not specified as acute or chronic: Secondary | ICD-10-CM | POA: Diagnosis not present

## 2020-04-22 DIAGNOSIS — H40013 Open angle with borderline findings, low risk, bilateral: Secondary | ICD-10-CM | POA: Diagnosis not present

## 2020-04-22 DIAGNOSIS — G43809 Other migraine, not intractable, without status migrainosus: Secondary | ICD-10-CM | POA: Diagnosis not present

## 2020-04-22 DIAGNOSIS — H2513 Age-related nuclear cataract, bilateral: Secondary | ICD-10-CM | POA: Diagnosis not present

## 2020-04-22 MED ORDER — ALBUTEROL SULFATE HFA 108 (90 BASE) MCG/ACT IN AERS: 2.0000 | INHALATION_SPRAY | RESPIRATORY_TRACT | 2 refills | Status: DC | PRN

## 2020-04-22 MED ORDER — METHYLPREDNISOLONE ACETATE 40 MG/ML IJ SUSP
40.0000 mg | Freq: Once | INTRAMUSCULAR | Status: AC
  Administered 2020-04-22: 40 mg via INTRAMUSCULAR

## 2020-04-22 MED ORDER — CLARITHROMYCIN 500 MG PO TABS: 500.0000 mg | ORAL_TABLET | Freq: Two times a day (BID) | ORAL | 0 refills | Status: DC

## 2020-04-22 MED ORDER — METHYLPREDNISOLONE ACETATE 80 MG/ML IJ SUSP
80.0000 mg | Freq: Once | INTRAMUSCULAR | Status: AC
  Administered 2020-04-22: 80 mg via INTRAMUSCULAR

## 2020-04-22 NOTE — Addendum Note (Signed)
Addended by: Wyvonne Lenz on: 04/22/2020 10:25 AM   Modules accepted: Orders

## 2020-04-22 NOTE — Progress Notes (Signed)
   Subjective:    Patient ID: Steven [name], male    DOB: [birth year], 70 y.o.   MRN: [id number]  HPI Here for 4 days of chest congestion, wheezing, and coughing up yellow sputum. No SOB or chest pain. No fever. Using Mucinex and drinking fluids.    Review of Systems  Constitutional: Negative.   HENT: Negative.   Eyes: Negative.   Respiratory: Positive for cough, chest tightness and wheezing. Negative for shortness of breath.   Cardiovascular: Negative.   Gastrointestinal: Negative.        Objective:   Physical Exam Constitutional:      Appearance: Normal appearance.  HENT:     Right Ear: Tympanic membrane, ear canal and external ear normal.     Left Ear: Tympanic membrane, ear canal and external ear normal.     Nose: Nose normal.     Mouth/Throat:     Pharynx: Oropharynx is clear.  Eyes:     Conjunctiva/sclera: Conjunctivae normal.  Cardiovascular:     Rate and Rhythm: Normal rate and regular rhythm.     Pulses: Normal pulses.     Heart sounds: Normal heart sounds.  Pulmonary:     Effort: Pulmonary effort is normal. No respiratory distress.     Breath sounds: No stridor. Wheezing present. No rhonchi or rales.  Lymphadenopathy:     Cervical: No cervical adenopathy.  Neurological:     Mental Status: He is alert.           Assessment & Plan:  Bronchitis, treat with Biaxin. Use albuterol prn. Given a shot of DepoMedrol.  Alysia Penna, MD

## 2020-05-06 DIAGNOSIS — M47812 Spondylosis without myelopathy or radiculopathy, cervical region: Secondary | ICD-10-CM | POA: Diagnosis not present

## 2020-05-21 DIAGNOSIS — H11042 Peripheral pterygium, stationary, left eye: Secondary | ICD-10-CM | POA: Diagnosis not present

## 2020-05-21 DIAGNOSIS — G43809 Other migraine, not intractable, without status migrainosus: Secondary | ICD-10-CM | POA: Diagnosis not present

## 2020-05-21 DIAGNOSIS — H3561 Retinal hemorrhage, right eye: Secondary | ICD-10-CM | POA: Diagnosis not present

## 2020-05-21 DIAGNOSIS — M5412 Radiculopathy, cervical region: Secondary | ICD-10-CM | POA: Diagnosis not present

## 2020-05-21 DIAGNOSIS — H43812 Vitreous degeneration, left eye: Secondary | ICD-10-CM | POA: Diagnosis not present

## 2020-05-21 DIAGNOSIS — H40013 Open angle with borderline findings, low risk, bilateral: Secondary | ICD-10-CM | POA: Diagnosis not present

## 2020-05-21 DIAGNOSIS — H2513 Age-related nuclear cataract, bilateral: Secondary | ICD-10-CM | POA: Diagnosis not present

## 2020-06-05 ENCOUNTER — Telehealth: Payer: Self-pay | Admitting: Pharmacist

## 2020-06-05 NOTE — Chronic Care Management (AMB) (Signed)
Chronic Care Management Pharmacy Assistant   Name: Steven [name]  MRN: [id number] DOB: 1950-10-22  Reason for Encounter: Disease State/ General Assessment Call.    Conditions to be addressed/monitored: HTN and HLD   Recent office visits:  04/22/20 Alysia Penna MD (PCP) - seen for bronchitis. Patient started on albuterol sulfate inhaler and clarithromycin 500mg  twice daily. Received two methylprednisolone injections in office. Follow up as needed.   01/16/20 Alysia Penna MD (PCP) - televisit for hypertension and other chronic conditions. No medication changes. No follow up noted.   12/10/19 Alysia Penna MD (PCP) - seen for herpes zoster without complication and other issues. Patient started on methylprednisolone 4mg , oxycodone 5-325 and valacyclovir 1000 mg 3 times daily.   Recent consult visits:  04/07/20 Melina Schools (Orthopedic Surgery) - seen for arthrodesis status.no medication changes and no follow up noted.  04/01/20 Melina Schools (Orthopedic Surgery) - seen for Radiculopathy of the cervical region. MRI performed. No medication changes and no follow up.   03/26/20 Quay Burow MD (Cardiology) -  Seen for atypical chest pain. No medication changes. Follow up in 1 year.   03/24/20 Festus Aloe (Urology) - seen for benign essential Hypertension and benign prostatic hyperplasia. Cystoscopy performed. No medication changes or follow up noted.   03/14/20 Nelson Chimes (Orthopedic Surgery) - seen for arthrodesis status, cervicalgia and other cervical disc degeneration. No medication changes or follow up noted.   02/26/20 Netta Cedars (Orthopedic Surgery) - seen for impingement syndrome of right shoulder. Received injection of kenalog and drainage of of large bursa joint in shoulder in office. No medication changes or follow up noted.   01/25/20 Quay Burow MD (Cardiology) - seen for initial visit for headache and visual disturbance among other chronic conditions. Patient started on  metoprolol 50mg . Changed folic acid to 1mg  daily and discontinued methylprednisolone 4mg . Follow up in 2 months.   01/23/20 Jomarie Longs [facility]) - seen for cataracts and other eye issues. No medication changes or follow up noted.   Hospital visits:  None in previous 6 months  Medications: Outpatient Encounter Medications as of 06/05/2020  Medication Sig  . acyclovir cream (ZOVIRAX) 5 % Apply 1 application topically daily as needed (fever blisters).  Marland Kitchen albuterol (VENTOLIN HFA) 108 (90 Base) MCG/ACT inhaler Inhale 2 puffs into the lungs every 4 (four) hours as needed for wheezing or shortness of breath.  . clarithromycin (BIAXIN) 500 MG tablet Take 1 tablet (500 mg total) by mouth 2 (two) times daily.  . clobetasol cream (TEMOVATE) 4.76 % Apply 1 application topically daily as needed (herpes).   . eletriptan (RELPAX) 20 MG tablet Take 1 tablet (20 mg total) by mouth as needed for migraine.  . ezetimibe (ZETIA) 10 MG tablet Take 1 tablet (10 mg total) by mouth daily.  . fenofibrate (TRICOR) 145 MG tablet Take 1 tablet (145 mg total) by mouth every evening.  . folic acid (FOLVITE) 1 MG tablet Take 1 mg by mouth daily.  . hydrocortisone-pramoxine (PROCTOFOAM HC) rectal foam Place 1 applicator rectally 2 (two) times daily.  Marland Kitchen losartan (COZAAR) 50 MG tablet Take 1 tablet (50 mg total) by mouth daily.  . metoprolol tartrate (LOPRESSOR) 50 MG tablet Take 1 tablet (50 mg total) by mouth once for 1 dose.  . RABEprazole (ACIPHEX) 20 MG tablet Take 1 tablet (20 mg total) by mouth 2 (two) times daily.  . rivaroxaban (XARELTO) 20 MG TABS tablet Take 1 tablet (20 mg total) by mouth daily with supper.  Marland Kitchen  temazepam (RESTORIL) 30 MG capsule Take 1 capsule (30 mg total) by mouth at bedtime as needed for sleep.  . valACYclovir (VALTREX) 1000 MG tablet Take 1 tablet (1,000 mg total) by mouth 3 (three) times daily.   No facility-administered encounter medications on file as of 06/05/2020.    Reviewed chart  prior to disease state call. Spoke with patient regarding BP  Recent Office Vitals: BP Readings from Last 3 Encounters:  04/22/20 118/76  03/26/20 124/76  02/05/20 (!) 102/55   Pulse Readings from Last 3 Encounters:  04/22/20 70  03/26/20 60  02/05/20 (!) 58    Wt Readings from Last 3 Encounters:  04/22/20 191 lb 3.2 oz (86.7 kg)  03/26/20 192 lb (87.1 kg)  01/25/20 192 lb (87.1 kg)     Kidney Function Lab Results  Component Value Date/Time   CREATININE 1.48 (H) 02/01/2020 12:13 PM   CREATININE 1.36 (H) 11/14/2019 10:57 AM   CREATININE 1.30 11/13/2018 08:52 AM   GFR 54.57 (L) 11/13/2018 08:52 AM   GFRNONAA 47 (L) 02/01/2020 12:13 PM   GFRNONAA 52 (L) 11/14/2019 10:57 AM   GFRAA 54 (L) 02/01/2020 12:13 PM   GFRAA 60 11/14/2019 10:57 AM    BMP Latest Ref Rng & Units 02/01/2020 11/14/2019 11/13/2018  Glucose 65 - 99 mg/dL 77 92 102(H)  BUN 8 - 27 mg/dL 17 18 19   Creatinine 0.76 - 1.27 mg/dL 1.48(H) 1.36(H) 1.30  BUN/Creat Ratio 10 - 24 11 13  -  Sodium 134 - 144 mmol/L 142 141 140  Potassium 3.5 - 5.2 mmol/L 4.8 4.6 4.2  Chloride 96 - 106 mmol/L 104 104 104  CO2 20 - 29 mmol/L 24 29 28   Calcium 8.6 - 10.2 mg/dL 10.1 10.2 9.5    . Current antihypertensive regimen:    losartan 50mg , 1 tablet daily         Metoprolol 50mg  - take once daily.   . How often are you checking your Blood Pressure? infrequently  . Current home BP readings: Rise Paganini patients wife stated he does not check regularly at home so she had no readings.   . What recent interventions/DTPs have been made by any provider to improve Blood Pressure control since last CPP Visit: Metoprolol was added by cardiologist.   . Any recent hospitalizations or ED visits since last visit with CPP? No  . What diet changes have been made to improve Blood Pressure Control?  o Spoke with patients wife Rise Paganini who stated that patient does not eat a lot of salt and she cooks meals quite a bit. She stated that they grill  most of their food and eat plenty of vegetables.she stated that patient drink either Gatorade or water and no sodas or sweet tea. Rise Paganini stated that patient tends to eat pretty healthy and gets a serving of fruit a day as well. Rise Paganini stated that patient eats salmon once a week to help with his cholesterol.   . What exercise is being done to improve your Blood Pressure Control?  o Patient was working in his garden when I called and turned phone over to his wife [place]. Rise Paganini stated patient is very active and constantly doing something outdoors. Rise Paganini stated patient has no trouble doing anything.   Adherence Review: Is the patient currently on ACE/ARB medication? Yes Does the patient have >5 day gap between last estimated fill dates? No  Comprehensive medication review performed; Spoke to patient regarding cholesterol  Lipid Panel    Component Value Date/Time  CHOL 195 11/14/2019 1057   TRIG 144 11/14/2019 1057   HDL 48 11/14/2019 1057   LDLCALC 121 (H) 11/14/2019 1057   LDLDIRECT 143.6 08/28/2012 0915    10-year ASCVD risk score: The 10-year ASCVD risk score Mikey Bussing DC Brooke Bonito., et al., 2013) is: 19.6%   Values used to calculate the score:     Age: 84 years     Sex: Male     Is Non-Hispanic African American: No     Diabetic: No     Tobacco smoker: No     Systolic Blood Pressure: 782 mmHg     Is BP treated: Yes     HDL Cholesterol: 48 mg/dL     Total Cholesterol: 195 mg/dL  . Current antihyperlipidemic regimen:   ezetimibe 10mg , 1 tablet daily  fenofibrate 145mg , 1 tablet every evening  . Previous antihyperlipidemic medications tried: None.   . ASCVD risk enhancing conditions: age >46 and HTN  . What recent interventions/DTPs have been made by any provider to improve Cholesterol control since last CPP Visit: None.   . Any recent hospitalizations or ED visits since last visit with CPP? No  . What diet changes have been made to improve Cholesterol?  o Spoke with patients  wife Rise Paganini who stated that patient does not eat a lot of salt and she cooks meals quite a bit. She stated that they grill most of their food and eat plenty of vegetables.she stated that patient drink either Gatorade or water and no sodas or sweet tea. Rise Paganini stated that patient tends to eat pretty healthy and gets a serving of fruit a day as well. Rise Paganini stated that patient eats salmon once a week to help with his cholesterol.    . What exercise is being done to improve Cholesterol?  o Patient was working in his garden when I called and turned phone over to his wife [place]. Rise Paganini stated patient is very active and constantly doing something outdoors. Rise Paganini stated patient has no trouble doing anything.    Notes:  Spoke with patients wife Rise Paganini per patient verbal okay and went over medications as listed. Rise Paganini reports patient is taking all medications as prescribed except patient is not taking metoprolol. She states patient never has started this medication. Rise Paganini stated that patient has not really checked his blood pressure at home because anytime they go to the doctors its always running normal. Message sent to Jeni Salles to verify need or no need to take the metoprolol. Rise Paganini states that they eat pretty healthy in general and grill their food most of the time. Rise Paganini reports no issues from patients medications at this time or trouble getting any medications. I re encouraged beverly to make sure patient is checking his blood pressure at least once weekly and writing the number down.beverly verbalized understanding and is agreeable. Patient is overdue for follow up with the clinical pharmacist, beverly was agreeable to 07/07/20 at 9:30am by phone. Message sent to [facility] scheduler to schedule. Rise Paganini thanked me for my call and stated that she was also going to call Dr. Barbie Banner office to schedule patients yearly visit with PCP in November.   Adherence Review: Does the  patient have >5 day gap between last estimated fill dates? No   Star Rating Drugs:   Losartan 50mg  - last filled on 06/05/20 [facility] at [facility] Pharmacist Assistant [phone]

## 2020-06-11 ENCOUNTER — Telehealth: Payer: Self-pay | Admitting: Family Medicine

## 2020-06-11 MED ORDER — RABEPRAZOLE SODIUM 20 MG PO TBEC: 20.0000 mg | DELAYED_RELEASE_TABLET | Freq: Two times a day (BID) | ORAL | 3 refills | Status: DC

## 2020-06-11 MED ORDER — EZETIMIBE 10 MG PO TABS: 10.0000 mg | ORAL_TABLET | Freq: Every day | ORAL | 1 refills | Status: DC

## 2020-06-11 NOTE — Telephone Encounter (Signed)
Rx's sent in. °

## 2020-06-11 NOTE — Telephone Encounter (Signed)
Patient is calling and is requesting a refill for RABEprazole (ACIPHEX) 20 MG tablet and  ezetimibe (ZETIA) 10 MG tablet to be sent to  [facility] ([place], [address]  [place], [place] [address]  Phone:  [phone] Fax:  [phone] CB is [phone]

## 2020-06-12 ENCOUNTER — Telehealth: Payer: Self-pay | Admitting: Family Medicine

## 2020-06-12 NOTE — Telephone Encounter (Signed)
Pt returned the call to the office and will call back once he know what his schedule is like.

## 2020-06-12 NOTE — Telephone Encounter (Signed)
Left message for patient to call back and schedule Medicare Annual Wellness Visit (AWV) either virtually or in office.   Last [facility] 05/28/17  please schedule at anytime with [facility] Nurse Health Advisor 1 or 2   This should be a 45 minute visit.

## 2020-07-04 ENCOUNTER — Telehealth: Payer: Self-pay | Admitting: Pharmacist

## 2020-07-04 NOTE — Chronic Care Management (AMB) (Signed)
    Chronic Care Management Pharmacy Assistant   Name: ELVAN EBRON  MRN: [id number] DOB: 01/02/1951  07-04-2020- Patient called to remind of appointment with Jeni Salles CPP on 07-07-2020 at 9:30 via telephone call   No answer, left message of appointment date, time and type of appointment via telephone . Left message to have all medications, supplements, blood pressure and/or blood sugar logs available during appointment and to return call if need to reschedule.  Star Rating Drug:  Losartan 50mg  Last filled 06-05-2020 [facility]  Any gaps in medications fill history?  Metoprolol 50mg  last filled 01-25-2020 [facility] 20mg  last filled 01-16-2020 [facility] mail [facility]   Pattricia Boss, [place] Pharmacist Assistant [phone]

## 2020-07-06 NOTE — Progress Notes (Signed)
Chronic Care Management Pharmacy Note  07/10/2020 Name:  Steven [name] MRN:  [id number] DOB:  26-May-1950  Summary: LDL not at goal < 100 BP at goal in office  Recommendations/Changes made from today's visit: -Recommended trial of long acting moderate intensity statin 3 days a week due to previous myalgias but patient declined -Recommended for patient to monitor blood pressure at home routinely -Discussed purpose of colchicine and diltiazem but recommended for patient to not take them  Plan: BP assessment in 2 months   Subjective: Steven [name] is an 70 y.o. year old male who is a primary patient of Laurey Morale, MD.  The CCM team was consulted for assistance with disease management and care coordination needs.    Engaged with patient by telephone for follow up visit in response to provider referral for pharmacy case management and/or care coordination services.   Consent to Services:  The patient was given information about Chronic Care Management services, agreed to services, and gave verbal consent prior to initiation of services.  Please see initial visit note for detailed documentation.   Patient Care Team: Laurey Morale, MD as PCP - General Viona Gilmore, [facility] as Pharmacist (Pharmacist)  Recent office visits: 04/22/20 Alysia Penna MD (PCP) - seen for bronchitis. Patient started on albuterol sulfate inhaler and clarithromycin 568m twice daily. Received two methylprednisolone injections in office. Follow up as needed.    01/16/20 SAlysia PennaMD (PCP) - televisit for hypertension and other chronic conditions. No medication changes. No follow up noted.  Recent consult visits: 05/21/20 CLevy Pupa PA (emergeortho): Unable to access notes.  04/07/20 DMelina Schools(Orthopedic Surgery) - seen for arthrodesis status.no medication changes and no follow up noted.   04/01/20 DMelina Schools(Orthopedic Surgery) - seen for Radiculopathy of the cervical region. MRI performed. No  medication changes and no follow up.   03/26/20 JQuay BurowMD (Cardiology) -  Seen for atypical chest pain. No medication changes. Follow up in 1 year.   03/24/20 MFestus Aloe(Urology) - seen for benign essential Hypertension and benign prostatic hyperplasia. Cystoscopy performed. No medication changes or follow up noted.   03/14/20 ANelson Chimes(Orthopedic Surgery) - seen for arthrodesis status, cervicalgia and other cervical disc degeneration. No medication changes or follow up noted.   02/26/20 SNetta Cedars(Orthopedic Surgery) - seen for impingement syndrome of right shoulder. Received injection of kenalog and drainage of of large bursa joint in shoulder in office. No medication changes or follow up noted.   01/25/20 JQuay BurowMD (Cardiology) - seen for initial visit for headache and visual disturbance among other chronic conditions. Patient started on metoprolol 5107m Changed folic acid to 58m76maily and discontinued methylprednisolone 4mg18mollow up in 2 months.   [facility] and other [facility]. No medication changes or follow up noted.   Hospital visits: None in previous 6 months   Objective:  Lab Results  Component Value Date   CREATININE 1.48 (H) 02/01/2020   BUN 17 02/01/2020   GFR 54.57 (L) 11/13/2018   GFRNONAA 47 (L) 02/01/2020   GFRAA 54 (L) 02/01/2020   NA 142 02/01/2020   K 4.8 02/01/2020   CALCIUM 10.1 02/01/2020   CO2 24 02/01/2020   GLUCOSE 77 02/01/2020    Lab Results  Component Value Date/Time   HGBA1C 5.6 11/14/2019 10:57 AM   HGBA1C 5.6 02/07/2013 06:10 PM   GFR 54.57 (L) 11/13/2018 08:52 AM   GFR 47.84 (  L) 11/09/2017 09:42 AM    Last diabetic Eye exam: No results found for: HMDIABEYEEXA  Last diabetic Foot exam: No results found for: HMDIABFOOTEX   Lab Results  Component Value Date   CHOL 195 11/14/2019   HDL 48 11/14/2019   LDLCALC 121 (H) 11/14/2019   LDLDIRECT 143.6 08/28/2012   TRIG 144  11/14/2019   CHOLHDL 4.1 11/14/2019    Hepatic Function Latest Ref Rng & Units 11/14/2019 11/13/2018 11/09/2017  Total Protein 6.1 - 8.1 g/dL 7.4 6.9 7.1  Albumin 3.5 - 5.2 g/dL - 4.5 4.7  AST 10 - 35 U/L _0 ALT 9 - 46 U/L 30 34 30  Alk Phosphatase 39 - 117 U/L - 50 42  Total Bilirubin 0.2 - 1.2 mg/dL 1.0 0.6 0.7  Bilirubin, Direct 0.0 - 0.2 mg/dL 0.2 0.1 0.1    Lab Results  Component Value Date/Time   TSH 2.06 11/14/2019 10:57 AM   TSH 1.19 11/13/2018 08:52 AM    CBC Latest Ref Rng & Units 11/14/2019 11/13/2018 11/09/2017  WBC 3.8 - 10.8 Thousand/uL 4.6 4.4 4.2  Hemoglobin 13.2 - 17.1 g/dL 15.8 15.4 15.2  Hematocrit 38.5 - 50.0 % 46.3 44.9 44.5  Platelets 140 - 400 Thousand/uL 235 246.0 240.0    No results found for: VD25OH  Clinical ASCVD: No  The 10-year ASCVD risk score Mikey Bussing DC Jr., et al., 2013) is: 30.6%   Values used to calculate the score:     Age: 28 years     Sex: Male     Is Non-Hispanic African American: No     Diabetic: No     Tobacco smoker: No     Systolic Blood Pressure: 268 mmHg     Is BP treated: Yes     HDL Cholesterol: 48 mg/dL     Total Cholesterol: 195 mg/dL    Depression screen [facility] 2/9 01/16/2020 11/14/2019 05/18/2017  Decreased Interest 0 0 0  Down, Depressed, Hopeless 0 0 0  PHQ - 2 Score 0 0 0  Some recent data might be hidden     Social History   Tobacco Use  Smoking Status Former   Packs/day: 1.50   Years: 20.00   Pack years: 30.00   Types: Cigarettes   Quit date: 03/03/1973   Years since quitting: 47.3  Smokeless Tobacco Never  Tobacco Comments   discussed AAA; did have CT of abd/ pelvis    BP Readings from Last 3 Encounters:  04/22/20 118/76  03/26/20 124/76  02/05/20 (!) 102/55   Pulse Readings from Last 3 Encounters:  04/22/20 70  03/26/20 60  02/05/20 (!) 58   Wt Readings from Last 3 Encounters:  04/22/20 191 lb 3.2 oz (86.7 kg)  03/26/20 192 lb (87.1 kg)  01/25/20 192 lb (87.1 kg)   BMI Readings from Last 3  Encounters:  04/22/20 27.83 kg/m  03/26/20 27.95 kg/m  01/25/20 28.77 kg/m    Assessment/Interventions: Review of patient past medical history, allergies, medications, health status, including review of consultants reports, laboratory and other test data, was performed as part of comprehensive evaluation and provision of chronic care management services.   SDOH:  (Social Determinants of Health) assessments and interventions performed: No  SDOH Screenings   Alcohol Screen: Not on file  Depression (PHQ2-9): Low Risk    PHQ-2 Score: 0  Financial Resource Strain: Not on file  Food Insecurity: Not on file  Housing: Not on file  Physical Activity: Not on file  Social  Connections: Not on file  Stress: Not on file  Tobacco Use: Medium Risk   Smoking Tobacco Use: Former   Smokeless Tobacco Use: Never  Transportation Needs: Not on file    CCM Care Plan  Allergies  Allergen Reactions   Morphine Other (See Comments)    headache   Amoxicillin Nausea Only   Amitriptyline Hcl Other (See Comments)    Mood changes   Amoxicillin-Pot Clavulanate     REACTION: nausea   Cyclobenzaprine Hcl     unknown   Levofloxacin Swelling   Lisinopril-Hydrochlorothiazide     depression    Medications Reviewed Today     Reviewed by Viona Gilmore, [facility] (Pharmacist) on 07/07/20 at [phone]  Med List Status: <None>   Medication Order Taking? Sig Documenting Provider Last Dose Status Informant  acyclovir cream (ZOVIRAX) 5 % [id number] Yes Apply 1 application topically daily as needed (fever blisters). Laurey Morale, MD Taking Active Spouse/Significant Other  albuterol (VENTOLIN HFA) 108 (90 Base) MCG/ACT inhaler [id number]  Inhale 2 puffs into the lungs every 4 (four) hours as needed for wheezing or shortness of breath. Laurey Morale, MD  Active     Discontinued 07/07/20 [phone] (Completed Course) eletriptan (RELPAX) 20 MG tablet [id number]  Take 1 tablet (20 mg total) by mouth as needed for migraine. Laurey Morale, MD  Active   ezetimibe (ZETIA) 10 MG tablet [id number]  Take 1 tablet (10 mg total) by mouth daily. Laurey Morale, MD  Active   fenofibrate (TRICOR) 145 MG tablet [id number]  Take 1 tablet (145 mg total) by mouth every evening. Laurey Morale, MD  Active   folic acid (FOLVITE) 1 MG tablet [id number]  Take 1 mg by mouth daily. [provider]  Active Spouse/Significant Other  hydrocortisone-pramoxine (PROCTOFOAM [facility]) rectal foam [id number]  Place 1 applicator rectally 2 (two) times daily. Laurey Morale, MD  Active   losartan (COZAAR) 50 MG tablet [id number]  Take 1 tablet (50 mg total) by mouth daily. Laurey Morale, MD  Active   RABEprazole (ACIPHEX) 20 MG tablet [id number]  Take 1 tablet (20 mg total) by mouth 2 (two) times daily. Laurey Morale, MD  Active   rivaroxaban (XARELTO) 20 MG TABS tablet [id number]  Take 1 tablet (20 mg total) by mouth daily with supper. Laurey Morale, MD  Active   temazepam (RESTORIL) 30 MG capsule [id number]  Take 1 capsule (30 mg total) by mouth at bedtime as needed for sleep. Laurey Morale, MD  Active             Patient Active Problem List   Diagnosis Date Noted   Herpes zoster without complication 02/54/2706   Diverticulitis 03/29/2019   Esophageal dysphagia    Trigeminal neuralgia of right side of face 09/05/2017   DVT (deep venous thrombosis) ([place]) 07/22/2016   Renal insufficiency    Pulmonary emboli ([place]) 07/19/2016   Atypical chest pain 02/07/2013   HTN (hypertension) 05/10/2012   PERSONAL HX COLONIC POLYPS 01/30/2010   PULMONARY NODULE 23/76/2831   BILIARY COLIC 51/76/1607   Hereditary and idiopathic peripheral neuropathy 07/31/2008   KNEE PAIN 06/21/2007   Personal history of venous thrombosis and embolism 03/06/2007   Acute thromboembolism of deep veins of lower extremity ([place]) 03/01/2007   HERNIATED [place] 10/31/2006   FEVER BLISTER 10/24/2006   Hyperlipidemia 10/24/2006   GERD 10/24/2006   Migraine headache 10/24/2006    HIATAL HERNIA, HX OF 10/24/2006   Personal history of  urinary calculi 10/24/2006   BENIGN PROSTATIC HYPERTROPHY, HX OF 10/24/2006    Immunization History  Administered Date(s) Administered   Fluad Quad(high Dose 65+) 11/13/2018, 11/14/2019   Influenza Whole 10/24/2006, 10/11/2007   Influenza, High Dose Seasonal PF 11/04/2014, 10/07/2015, 11/03/2016, 12/21/2017   Influenza,inj,Quad PF,6+ Mos 09/06/2013   Pneumococcal Conjugate-13 09/06/2013   Pneumococcal Polysaccharide-23 04/14/2007, 05/18/2017   Td 03/12/2002    Conditions to be addressed/monitored:  Hypertension, Hyperlipidemia, GERD, and Insomnia, recurrent DVT/PE, migraines  Care Plan : [place]  Updates made by Viona Gilmore, [place] since 07/10/2020 12:00 AM     Problem: Problem: Hypertension, Hyperlipidemia, GERD, and Insomnia, recurrent DVT/PE, migraines      Long-Range Goal: Patient-Specific Goal   Start Date: 07/07/2020  Expected End Date: 07/07/2021  This Visit's Progress: On track  Priority: High  Note:   Current Barriers:  Unable to independently monitor therapeutic efficacy Unable to achieve control of cholesterol  Suboptimal therapeutic regimen for cholesterol  Pharmacist Clinical Goal(s):  Patient will achieve adherence to monitoring guidelines and medication adherence to achieve therapeutic efficacy achieve control of cholesterol as evidenced by next lipid panel  through collaboration with PharmD and provider.   Interventions: 1:1 collaboration with Laurey Morale, MD regarding development and update of comprehensive plan of care as evidenced by provider attestation and co-signature Inter-disciplinary care team collaboration (see longitudinal plan of care) Comprehensive medication review performed; medication list updated in electronic medical record  Hypertension (BP goal <130/80) -Controlled -Current treatment: losartan 50 mg 1 tablet daily -Medications previously tried: lisinopril,  amlodipine  -Current home readings: 120/78, 130/77 -Current dietary habits: very seldom uses salt -Current exercise habits: walking and working in the yard; no structured exercise -Denies hypotensive/hypertensive symptoms -Educated on Exercise goal of 150 minutes per week; Importance of home blood pressure monitoring; Proper BP monitoring technique; Symptoms of hypotension and importance of maintaining adequate hydration; -Counseled to monitor BP at home weekly, document, and provide log at future appointments -Counseled on diet and exercise extensively Recommended to continue current medication Counseled on importance of checking blood pressure when symptomatic  Hyperlipidemia: (LDL goal < 100) -Uncontrolled -Current treatment: Zetia 10 mg 1 tablet daily Fenofibrate 145 mg 1 tablet every evening -Medications previously tried: Lipitor (muscle pain)  -Current dietary patterns: tries to eat more boiled foods; eats fish twice a week and cooks with olive oil; eating more vegetables -Current exercise habits: no structured exercise -Educated on Cholesterol goals;  Benefits of statin for ASCVD risk reduction; Importance of limiting foods high in cholesterol; Exercise goal of 150 minutes per week; -Counseled on diet and exercise extensively Recommended to continue current medication Counseled on benefits of trying low dose statin and alternating days  Insomnia (Goal: improve quality and quantity of sleep) -Controlled -Current treatment  Temazepam 30 mg 1 capsule at bedtime (doesn't use every night)as needed for sleep -Medications previously tried: melatonin, Tylenol PM -Counseled on practicing good sleep hygiene by setting a sleep schedule and maintaining it, avoid excessive napping, following a nightly routine, avoiding screen time for 30-60 minutes before going to bed, and making the bedroom a cool, quiet and dark space -Recommended to limit screen time before bed  Migraines (Goal:  minimize symptoms) -Controlled -Current treatment  Eletriptan 20 mg 1 tablet as needed for migraines -Medications previously tried: none  -Recommended to continue current medication  GERD/esophageal spasms (Goal: minimize symptoms) -Controlled -Current treatment  Rabeprazole 20 mg 1 tablet daily -Medications previously tried: Nexium, diltiazem (never started) -Recommended  to continue current medication Educated on the use of diltiazem for esophageal spasms and due to patient preference, he does not wish to take  Recurrent DVT/PE (Goal: minimize symptoms) -Controlled -Current treatment  Xarelto 20 mg 1 tablet daily with supper folic acid 153 mcg, 1 tablet daily -Medications previously tried: none  -Counseled on monitoring for signs of bleeding such as unexplained and excessive bleeding from a cut or injury, easy or excessive bruising, blood in urine or stools, and nosebleeds without a known cause  Health Maintenance -Vaccine gaps: shingles, COVID vaccine, tetanus -Current therapy:  Folic acid 1 mg daily Clobetasol 0.05% apply as needed -Educated on Cost vs benefit of each product must be carefully weighed by individual consumer -Patient is satisfied with current therapy and denies issues -Recommended to continue current medication  Patient Goals/Self-Care Activities Patient will:  - take medications as prescribed check blood pressure weekly, document, and provide at future appointments target a minimum of 150 minutes of moderate intensity exercise weekly  Follow Up Plan: Telephone follow up appointment with care management team member scheduled for: 6 months       Medication Assistance: None required.  Patient affirms current coverage meets needs.  Compliance/Adherence/Medication fill history: Care Gaps: COVID vaccine, Hep C screening, shingrix, tetanus, colonoscopy  Star-Rating Drugs: Losartan 37m Last filled 06-05-2020 90DS [facility] Mail order  Patient's preferred  pharmacy is:  [id number]: [phone]: [address]Phone: [phone]: [phone] Uses pill box? Yes Pt endorses 99% compliance  We discussed: Current pharmacy is preferred with insurance plan and patient is satisfied with pharmacy services Patient decided to: Continue current medication management strategy  Care Plan and Follow Up Patient Decision:  Patient agrees to Care Plan and Follow-up.  Plan: Telephone follow up appointment with care management team member scheduled for:  6 months  MJeni Salles PharmD, BAvocaPharmacist LEast Will[place]at [phone]

## 2020-07-07 ENCOUNTER — Ambulatory Visit (INDEPENDENT_AMBULATORY_CARE_PROVIDER_SITE_OTHER): Payer: PPO | Admitting: Pharmacist

## 2020-07-07 DIAGNOSIS — E785 Hyperlipidemia, unspecified: Secondary | ICD-10-CM | POA: Diagnosis not present

## 2020-07-07 DIAGNOSIS — I10 Essential (primary) hypertension: Secondary | ICD-10-CM

## 2020-07-10 NOTE — Patient Instructions (Signed)
Hi Steven [name],  It was great to get to meet you over the telephone! Below is a summary of some of the topics we discussed.   Please reach out to me if you have any questions or need anything before our follow up!  Best, Steven [name]  Jeni Salles, PharmD, [place] [facility] [place]   Visit Information   Goals Addressed   None    Patient Care Plan: CCM Pharmacy Care Plan     Problem Identified: Problem: Hypertension, Hyperlipidemia, GERD, and Insomnia, recurrent DVT/PE, migraines      Long-Range Goal: Patient-Specific Goal   Start Date: 07/07/2020  Expected End Date: 07/07/2021  This Visit's Progress: On track  Priority: High  Note:   Current Barriers:  Unable to independently monitor therapeutic efficacy Unable to achieve control of cholesterol  Suboptimal therapeutic regimen for cholesterol  Pharmacist Clinical Goal(s):  Patient will achieve adherence to monitoring guidelines and medication adherence to achieve therapeutic efficacy achieve control of cholesterol as evidenced by next lipid panel  through collaboration with PharmD and provider.   Interventions: 1:1 collaboration with Laurey Morale, MD regarding development and update of comprehensive plan of care as evidenced by provider attestation and co-signature Inter-disciplinary care team collaboration (see longitudinal plan of care) Comprehensive medication review performed; medication list updated in electronic medical record  Hypertension (BP goal <130/80) -Controlled -Current treatment: losartan 50 mg 1 tablet daily -Medications previously tried: lisinopril, amlodipine  -Current home readings: 120/78, 130/77 -Current dietary habits: very seldom uses salt -Current exercise habits: walking and working in the yard; no structured exercise -Denies hypotensive/hypertensive symptoms -Educated on Exercise goal of 150 minutes per week; Importance of home blood pressure  monitoring; Proper BP monitoring technique; Symptoms of hypotension and importance of maintaining adequate hydration; -Counseled to monitor BP at home weekly, document, and provide log at future appointments -Counseled on diet and exercise extensively Recommended to continue current medication Counseled on importance of checking blood pressure when symptomatic  Hyperlipidemia: (LDL goal < 100) -Uncontrolled -Current treatment: Zetia 10 mg 1 tablet daily Fenofibrate 145 mg 1 tablet every evening -Medications previously tried: Lipitor (muscle pain)  -Current dietary patterns: tries to eat more boiled foods; eats fish twice a week and cooks with olive oil; eating more vegetables -Current exercise habits: no structured exercise -Educated on Cholesterol goals;  Benefits of statin for ASCVD risk reduction; Importance of limiting foods high in cholesterol; Exercise goal of 150 minutes per week; -Counseled on diet and exercise extensively Recommended to continue current medication Counseled on benefits of trying low dose statin and alternating days  Insomnia (Goal: improve quality and quantity of sleep) -Controlled -Current treatment  Temazepam 30 mg 1 capsule at bedtime (doesn't use every night)as needed for sleep -Medications previously tried: melatonin, Tylenol PM -Counseled on practicing good sleep hygiene by setting a sleep schedule and maintaining it, avoid excessive napping, following a nightly routine, avoiding screen time for 30-60 minutes before going to bed, and making the bedroom a cool, quiet and dark space -Recommended to limit screen time before bed  Migraines (Goal: minimize symptoms) -Controlled -Current treatment  Eletriptan 20 mg 1 tablet as needed for migraines -Medications previously tried: none  -Recommended to continue current medication  GERD/esophageal spasms (Goal: minimize symptoms) -Controlled -Current treatment  Rabeprazole 20 mg 1 tablet  daily -Medications previously tried: Nexium, diltiazem (never started) -Recommended to continue current medication Educated on the use of diltiazem for esophageal spasms and due to patient preference, he does not  wish to take  Recurrent DVT/PE (Goal: minimize symptoms) -Controlled -Current treatment  Xarelto 20 mg 1 tablet daily with supper folic acid 643 mcg, 1 tablet daily -Medications previously tried: none  -Counseled on monitoring for signs of bleeding such as unexplained and excessive bleeding from a cut or injury, easy or excessive bruising, blood in urine or stools, and nosebleeds without a known cause  Health Maintenance -Vaccine gaps: shingles, COVID vaccine, tetanus -Current therapy:  Folic acid 1 mg daily Clobetasol 0.05% apply as needed -Educated on Cost vs benefit of each product must be carefully weighed by individual consumer -Patient is satisfied with current therapy and denies issues -Recommended to continue current medication  Patient Goals/Self-Care Activities Patient will:  - take medications as prescribed check blood pressure weekly, document, and provide at future appointments target a minimum of 150 minutes of moderate intensity exercise weekly  Follow Up Plan: Telephone follow up appointment with care management team member scheduled for: 6 months       Patient verbalizes understanding of instructions provided today and agrees to view in [name].  Telephone follow up appointment with pharmacy team member scheduled for:6 months  Viona Gilmore, [facility]

## 2020-07-18 ENCOUNTER — Other Ambulatory Visit (INDEPENDENT_AMBULATORY_CARE_PROVIDER_SITE_OTHER): Payer: PPO

## 2020-07-18 ENCOUNTER — Other Ambulatory Visit: Payer: Self-pay

## 2020-07-18 ENCOUNTER — Other Ambulatory Visit: Payer: PPO

## 2020-07-18 ENCOUNTER — Telehealth: Payer: Self-pay | Admitting: Family Medicine

## 2020-07-18 DIAGNOSIS — R319 Hematuria, unspecified: Secondary | ICD-10-CM

## 2020-07-18 LAB — URINALYSIS, ROUTINE W REFLEX MICROSCOPIC
Bilirubin Urine: NEGATIVE
Ketones, ur: NEGATIVE
Leukocytes,Ua: NEGATIVE
Nitrite: NEGATIVE
Specific Gravity, Urine: <=1.005 — AB (ref 1.000–1.030)
Urine Glucose: NEGATIVE
Urobilinogen, UA: 0.2 (ref 0.0–1.0)
pH: 6.5 (ref 5.0–8.0)

## 2020-07-18 MED ORDER — SULFAMETHOXAZOLE-TRIMETHOPRIM 800-160 MG PO TABS: 1.0000 | ORAL_TABLET | Freq: Two times a day (BID) | ORAL | 0 refills | Status: DC

## 2020-07-18 NOTE — Telephone Encounter (Signed)
After looking at the UA result, it seems he may have a UTI. We are sending the sample off for a culture. I have sent a prescription for an antibiotic for him to take to [facility] [address] in [place]. Tell the patient to stay on his blood thinner as usual

## 2020-07-18 NOTE — Telephone Encounter (Signed)
Spoke with pt aware of his urine results and Dr Sarajane Jews Advise to pick up Rs for Antibiotic from his pharmacy

## 2020-07-18 NOTE — Addendum Note (Signed)
Addended by: Amanda Cockayne on: 07/18/2020 05:00 PM   Modules accepted: Orders

## 2020-07-18 NOTE — Telephone Encounter (Signed)
He will leave a urine sample

## 2020-07-20 LAB — URINE CULTURE
MICRO NUMBER:: 12097105
SPECIMEN QUALITY:: ADEQUATE

## 2020-07-21 DIAGNOSIS — N201 Calculus of ureter: Secondary | ICD-10-CM | POA: Diagnosis not present

## 2020-07-21 DIAGNOSIS — R310 Gross hematuria: Secondary | ICD-10-CM | POA: Diagnosis not present

## 2020-07-22 DIAGNOSIS — H11042 Peripheral pterygium, stationary, left eye: Secondary | ICD-10-CM | POA: Diagnosis not present

## 2020-07-22 DIAGNOSIS — H2513 Age-related nuclear cataract, bilateral: Secondary | ICD-10-CM | POA: Diagnosis not present

## 2020-07-22 DIAGNOSIS — H40013 Open angle with borderline findings, low risk, bilateral: Secondary | ICD-10-CM | POA: Diagnosis not present

## 2020-07-22 DIAGNOSIS — H43812 Vitreous degeneration, left eye: Secondary | ICD-10-CM | POA: Diagnosis not present

## 2020-07-22 DIAGNOSIS — H3561 Retinal hemorrhage, right eye: Secondary | ICD-10-CM | POA: Diagnosis not present

## 2020-07-22 DIAGNOSIS — G43809 Other migraine, not intractable, without status migrainosus: Secondary | ICD-10-CM | POA: Diagnosis not present

## 2020-07-24 DIAGNOSIS — R208 Other disturbances of skin sensation: Secondary | ICD-10-CM | POA: Diagnosis not present

## 2020-07-24 DIAGNOSIS — L308 Other specified dermatitis: Secondary | ICD-10-CM | POA: Diagnosis not present

## 2020-07-29 ENCOUNTER — Ambulatory Visit (INDEPENDENT_AMBULATORY_CARE_PROVIDER_SITE_OTHER): Payer: PPO

## 2020-07-29 DIAGNOSIS — Z0000 Encounter for general adult medical examination without abnormal findings: Secondary | ICD-10-CM

## 2020-07-29 DIAGNOSIS — Z1211 Encounter for screening for malignant neoplasm of colon: Secondary | ICD-10-CM | POA: Diagnosis not present

## 2020-07-29 NOTE — Patient Instructions (Signed)
Steven [name] , Thank you for taking time to come for your Medicare Wellness Visit. I appreciate your ongoing commitment to your health goals. Please review the following plan we discussed and let me know if I can assist you in the future.   Screening recommendations/referrals: Colonoscopy: Referral completed 07/29/2020 Recommended yearly ophthalmology/optometry visit for glaucoma screening and checkup Recommended yearly dental visit for hygiene and checkup  Vaccinations: Influenza vaccine: due in fall 2022  Pneumococcal vaccine: completed series  Tdap vaccine: due with injury  Shingles vaccine: will obtain local pharmacy     Advanced directives: will provide copies   Conditions/risks identified: none   Next appointment: CPE 11/17/2020  0800am  with Dr. Sarajane Jews  Preventive Care 65 Years and Older, Male Preventive care refers to lifestyle choices and visits with your health care provider that can promote health and wellness. What does preventive care include? A yearly physical exam. This is also called an annual well check. Dental exams once or twice a year. Routine eye exams. Ask your health care provider how often you should have your eyes checked. Personal lifestyle choices, including: Daily care of your teeth and gums. Regular physical activity. Eating a healthy diet. Avoiding tobacco and drug use. Limiting alcohol use. Practicing safe sex. Taking low doses of aspirin every day. Taking vitamin and mineral supplements as recommended by your health care provider. What happens during an annual well check? The services and screenings done by your health care provider during your annual well check will depend on your age, overall health, lifestyle risk factors, and family history of disease. Counseling  Your health care provider may ask you questions about your: Alcohol use. Tobacco use. Drug use. Emotional well-being. Home and relationship well-being. Sexual activity. Eating  habits. History of falls. Memory and ability to understand (cognition). Work and work Statistician. Screening  You may have the following tests or measurements: Height, weight, and BMI. Blood pressure. Lipid and cholesterol levels. These may be checked every 5 years, or more frequently if you are over 47 years old. Skin check. Lung cancer screening. You may have this screening every year starting at age 70 if you have a 30-pack-year history of smoking and currently smoke or have quit within the past 15 years. Fecal occult blood test (FOBT) of the stool. You may have this test every year starting at age 50. Flexible sigmoidoscopy or colonoscopy. You may have a sigmoidoscopy every 5 years or a colonoscopy every 10 years starting at age 58. Prostate cancer screening. Recommendations will vary depending on your family history and other risks. Hepatitis C blood test. Hepatitis B blood test. Sexually transmitted disease (STD) testing. Diabetes screening. This is done by checking your blood sugar (glucose) after you have not eaten for a while (fasting). You may have this done every 1-3 years. Abdominal aortic aneurysm (AAA) screening. You may need this if you are a current or former smoker. Osteoporosis. You may be screened starting at age 81 if you are at high risk. Talk with your health care provider about your test results, treatment options, and if necessary, the need for more tests. Vaccines  Your health care provider may recommend certain vaccines, such as: Influenza vaccine. This is recommended every year. Tetanus, diphtheria, and acellular pertussis (Tdap, Td) vaccine. You may need a Td booster every 10 years. Zoster vaccine. You may need this after age 32. Pneumococcal 13-valent conjugate (PCV13) vaccine. One dose is recommended after age 75. Pneumococcal polysaccharide (PPSV23) vaccine. One dose is recommended after  age 64. Talk to your health care provider about which screenings and  vaccines you need and how often you need them. This information is not intended to replace advice given to you by your health care provider. Make sure you discuss any questions you have with your health care provider. Document Released: 01/24/2015 Document Revised: 09/17/2015 Document Reviewed: 10/29/2014 Elsevier Interactive Patient Education  2017 [place] Prevention in the Home Falls can cause injuries. They can happen to people of all ages. There are many things you can do to make your home safe and to help prevent falls. What can I do on the outside of my home? Regularly fix the edges of walkways and driveways and fix any cracks. Remove anything that might make you trip as you walk through a door, such as a raised step or threshold. Trim any bushes or trees on the path to your home. Use bright outdoor lighting. Clear any walking paths of anything that might make someone trip, such as rocks or tools. Regularly check to see if handrails are loose or broken. Make sure that both sides of any steps have handrails. Any raised decks and porches should have guardrails on the edges. Have any leaves, snow, or ice cleared regularly. Use sand or salt on walking paths during winter. Clean up any spills in your garage right away. This includes oil or grease spills. What can I do in the bathroom? Use night lights. Install grab bars by the toilet and in the tub and shower. Do not use towel bars as grab bars. Use non-skid mats or decals in the tub or shower. If you need to sit down in the shower, use a plastic, non-slip stool. Keep the floor dry. Clean up any water that spills on the floor as soon as it happens. Remove soap buildup in the tub or shower regularly. Attach bath mats securely with double-sided non-slip rug tape. Do not have throw rugs and other things on the floor that can make you trip. What can I do in the bedroom? Use night lights. Make sure that you have a light by your  bed that is easy to reach. Do not use any sheets or blankets that are too big for your bed. They should not hang down onto the floor. Have a firm chair that has side arms. You can use this for support while you get dressed. Do not have throw rugs and other things on the floor that can make you trip. What can I do in the kitchen? Clean up any spills right away. Avoid walking on wet floors. Keep items that you use a lot in easy-to-reach places. If you need to reach something above you, use a strong step stool that has a grab bar. Keep electrical cords out of the way. Do not use floor polish or wax that makes floors slippery. If you must use wax, use non-skid floor wax. Do not have throw rugs and other things on the floor that can make you trip. What can I do with my stairs? Do not leave any items on the stairs. Make sure that there are handrails on both sides of the stairs and use them. Fix handrails that are broken or loose. Make sure that handrails are as long as the stairways. Check any carpeting to make sure that it is firmly attached to the stairs. Fix any carpet that is loose or worn. Avoid having throw rugs at the top or bottom of the stairs. If you do  have throw rugs, attach them to the floor with carpet tape. Make sure that you have a light switch at the top of the stairs and the bottom of the stairs. If you do not have them, ask someone to add them for you. What else can I do to help prevent falls? Wear shoes that: Do not have high heels. Have rubber bottoms. Are comfortable and fit you well. Are closed at the toe. Do not wear sandals. If you use a stepladder: Make sure that it is fully opened. Do not climb a closed stepladder. Make sure that both sides of the stepladder are locked into place. Ask someone to hold it for you, if possible. Clearly mark and make sure that you can see: Any grab bars or handrails. First and last steps. Where the edge of each step is. Use tools that  help you move around (mobility aids) if they are needed. These include: Canes. Walkers. Scooters. Crutches. Turn on the lights when you go into a dark area. Replace any light bulbs as soon as they burn out. Set up your furniture so you have a clear path. Avoid moving your furniture around. If any of your floors are uneven, fix them. If there are any pets around you, be aware of where they are. Review your medicines with your doctor. Some medicines can make you feel dizzy. This can increase your chance of falling. Ask your doctor what other things that you can do to help prevent falls. This information is not intended to replace advice given to you by your health care provider. Make sure you discuss any questions you have with your health care provider. Document Released: 10/24/2008 Document Revised: 06/05/2015 Document Reviewed: 02/01/2014 Elsevier Interactive Patient Education  2017 [employer].

## 2020-07-29 NOTE — Progress Notes (Signed)
Subjective:   Skylar [place]oll is a 70 y.o. male who presents for an Initial Medicare Annual Wellness Visit.   Patient states he cannot log into my chart . Telephone visit completed .  Virtual Visit via Video Note  I connected with Rella Larve by a video enabled telemedicine application and verified that I am speaking with the correct person using two identifiers.  Location: Patient: Home Provider: Office Persons participating in the virtual visit: patient, provider   I discussed the limitations of evaluation and management by telemedicine and the availability of in person appointments. The patient expressed understanding and agreed to proceed.     Randel Pigg ,LPN   Review of Systems    N/a       Objective:    There were no vitals filed for this visit. There is no height or weight on file to calculate BMI.  Advanced Directives 05/18/2017 07/19/2016 06/30/2015 06/05/2015 07/27/2013 02/08/2013 04/13/2012  Does Patient Have a Medical Advance Directive? Yes Yes Yes Yes Patient has advance directive, copy not in chart Patient has advance directive, copy not in chart Patient does not have advance directive  Type of Advance Directive - Living will;Healthcare Power of [place];Living will Living will - -  Does patient want to make changes to medical advance directive? - No - Patient declined - No - Patient declined - No -  Copy of Healthcare Power of Attorney in Chart? - No - copy requested No - copy requested No - copy requested - Copy requested from family -  Pre-existing out of facility DNR order (yellow form or pink MOST form) - - - - - - No    Current Medications (verified) Outpatient Encounter Medications as of 07/29/2020  Medication Sig   acyclovir cream (ZOVIRAX) 5 % Apply 1 application topically daily as needed (fever blisters).   albuterol (VENTOLIN HFA) 108 (90 Base) MCG/ACT inhaler Inhale 2 puffs into the lungs every 4 (four) hours as needed for  wheezing or shortness of breath.   eletriptan (RELPAX) 20 MG tablet Take 1 tablet (20 mg total) by mouth as needed for migraine.   ezetimibe (ZETIA) 10 MG tablet Take 1 tablet (10 mg total) by mouth daily.   fenofibrate (TRICOR) 145 MG tablet Take 1 tablet (145 mg total) by mouth every evening.   folic acid (FOLVITE) 1 MG tablet Take 1 mg by mouth daily.   hydrocortisone-pramoxine (PROCTOFOAM HC) rectal foam Place 1 applicator rectally 2 (two) times daily.   losartan (COZAAR) 50 MG tablet Take 1 tablet (50 mg total) by mouth daily.   RABEprazole (ACIPHEX) 20 MG tablet Take 1 tablet (20 mg total) by mouth 2 (two) times daily.   rivaroxaban (XARELTO) 20 MG TABS tablet Take 1 tablet (20 mg total) by mouth daily with supper.   sulfamethoxazole-trimethoprim (BACTRIM DS) 800-160 MG tablet Take 1 tablet by mouth 2 (two) times daily.   temazepam (RESTORIL) 30 MG capsule Take 1 capsule (30 mg total) by mouth at bedtime as needed for sleep.   No facility-administered encounter medications on file as of 07/29/2020.    Allergies (verified) Morphine, Amoxicillin, Amitriptyline hcl, Amoxicillin-pot clavulanate, Cyclobenzaprine hcl, Levofloxacin, and Lisinopril-hydrochlorothiazide   History: Past Medical History:  Diagnosis Date   Clotting disorder ([place])    Displacement of intervertebral disc, site unspecified, without myelopathy    Esophageal reflux    Headache(784.0)    sinus and migraines   Herpes simplex without mention of complication  Hiatal hernia    HTN (hypertension) 05/10/2012   Hyperlipemia    Hypertrophy (benign) of prostate    Intestinal infection due to other organism, not elsewhere classified    Kidney stones    sees Dr. Junious Silk   Personal history of colonic polyps    Personal history of urinary calculi    Personal history of venous thrombosis and embolism    Renal insufficiency    Past Surgical History:  Procedure Laterality Date   APPENDECTOMY     BOTOX INJECTION N/A  04/13/2012   Procedure: BOTOX INJECTION;  Surgeon: Milus Banister, MD;  Location: [facility];  Service: Endoscopy;  Laterality: N/A;   [place] and 1998   CHOLECYSTECTOMY     COLONOSCOPY  07/03/2015   per Dr. Ardis Hughs, single polyp removed but not retrieved, left sided diverticula, repeat in 5 yrs    ESOPHAGEAL MANOMETRY N/A 02/26/2013   Procedure: ESOPHAGEAL MANOMETRY (EM);  Surgeon: Milus Banister, MD;  Location: [facility];  Service: Endoscopy;  Laterality: N/A;   ESOPHAGEAL MANOMETRY N/A 11/03/2018   Procedure: ESOPHAGEAL MANOMETRY (EM);  Surgeon: Milus Banister, MD;  Location: [facility];  Service: Endoscopy;  Laterality: N/A;   ESOPHAGOGASTRODUODENOSCOPY  10/17/2018   per Dr. Ardis Hughs, small hiatal hernia only    ESOPHAGOGASTRODUODENOSCOPY (EGD) WITH ESOPHAGEAL DILATION N/A 04/13/2012   Procedure: ESOPHAGOGASTRODUODENOSCOPY (EGD) WITH ESOPHAGEAL DILATION;  Surgeon: Milus Banister, MD;  Location: [facility];  Service: Endoscopy;  Laterality: N/A;  possible botox injection   FINGER SURGERY     3rd finger trigger bilateral    GALLBLADDER SURGERY     LITHOTRIPSY     ROTATOR CUFF REPAIR Right 01-10-12   per Dr. Esmond Plants, also repair torn biceps tendon    STERIOD INJECTION Left 07/27/2013   Procedure: STEROID INJECTION;  Surgeon: Augustin Schooling, MD;  Location: [facility];  Service: Orthopedics;  Laterality: Left;   TONSILLECTOMY     TRIGGER FINGER RELEASE Right 07/27/2013   Procedure: RIGHT RING FINGER A-1 RELEASE;  Surgeon: Augustin Schooling, MD;  Location: [facility];  Service: Orthopedics;  Laterality: Right;   URETERAL STENT PLACEMENT  July 2012   left ureter, per Dr. Junious Silk, for a stone    Family History  Problem Relation Age of Onset   Colon cancer Father    Heart attack Father    Heart disease Brother    Heart disease Brother    Esophageal cancer Neg Hx    Rectal cancer Neg Hx    Stomach cancer Neg Hx    Social History   Socioeconomic History   Marital  status: Married    Spouse name: Not on file   Number of children: Not on file   Years of education: Not on file   Highest education level: Not on file  Occupational History   Occupation: Retired  Tobacco Use   Smoking status: Former    Packs/day: 1.50    Years: 20.00    Pack years: 30.00    Types: Cigarettes    Quit date: 03/03/1973    Years since quitting: 47.4   Smokeless tobacco: Never   Tobacco comments:    discussed AAA; did have CT of abd/ pelvis   Substance and Sexual Activity   Alcohol use: No    Alcohol/week: 0.0 standard drinks   Drug use: No   Sexual activity: Not on file  Other Topics Concern   Not on file  Social History Narrative  Not on file   Social Determinants of Health   Financial Resource Strain: Not on file  Food Insecurity: Not on file  Transportation Needs: Not on file  Physical Activity: Not on file  Stress: Not on file  Social Connections: Not on file    Tobacco Counseling Counseling given: Not Answered Tobacco comments: discussed AAA; did have CT of abd/ pelvis    Clinical Intake:                 Diabetic?no         Activities of Daily Living No flowsheet data found.  Patient Care Team: Laurey Morale, MD as PCP - General Kipp Brood Mariam Dollar, [facility] as Pharmacist (Pharmacist)  Indicate any recent Medical Services you may have received from other than [facility] providers in the past year (date may be approximate).     Assessment:   This is a routine wellness examination for [place].  Hearing/Vision screen No results found.  Dietary issues and exercise activities discussed:     Goals Addressed   None    Depression Screen PHQ 2/9 Scores 01/16/2020 11/14/2019 05/18/2017 06/24/2014  PHQ - 2 Score 0 0 0 0    Fall Risk Fall Risk  01/16/2020 11/14/2019 12/01/2018 11/09/2017 05/18/2017  Falls in the past year? 0 0 0 No Yes  Comment - - Emmi Telephone Survey: data to providers prior to load - going from boat to dock   Number falls  in past yr: - 0 - - 1  Injury with Fall? - 0 - - -  Follow up - - - - Education provided    [place]:  Any stairs in or around the home? No  If so, are there any without handrails? No  Home free of loose throw rugs in walkways, pet beds, electrical cords, etc? Yes  Adequate lighting in your home to reduce risk of falls? Yes   ASSISTIVE DEVICES UTILIZED TO PREVENT FALLS:  Life alert? No  Use of a cane, walker or w/c? No  Grab bars in the bathroom? No  Shower chair or bench in shower? No  Elevated toilet seat or a handicapped toilet? No   Cognitive Function: Normal cognitive status assessed by direct observation by this Nurse Health Advisor. No abnormalities found.   MMSE - Mini Mental State Exam 05/18/2017  Not completed: (No Data)        Immunizations Immunization History  Administered Date(s) Administered   Fluad Quad(high Dose 65+) 11/13/2018, 11/14/2019   Influenza Whole 10/24/2006, 10/11/2007   Influenza, High Dose Seasonal PF 11/04/2014, 10/07/2015, 11/03/2016, 12/21/2017   Influenza,inj,Quad PF,6+ Mos 09/06/2013   Pneumococcal Conjugate-13 09/06/2013   Pneumococcal Polysaccharide-23 04/14/2007, 05/18/2017   Td 03/12/2002    TDAP status: Up to date  Flu Vaccine status: Up to date  Pneumococcal vaccine status: Up to date  Covid-19 vaccine status: Declined, Education has been provided regarding the importance of this vaccine but patient still declined. Advised may receive this vaccine at local pharmacy or Health Dept.or vaccine clinic. Aware to provide a copy of the vaccination record if obtained from local pharmacy or Health Dept. Verbalized acceptance and understanding.  Qualifies for Shingles Vaccine? Yes   Zostavax completed No   Shingrix Completed?: No.    Education has been provided regarding the importance of this vaccine. Patient has been advised to call insurance company to determine out of pocket expense if they have not  yet received this vaccine. Advised may also  receive vaccine at local pharmacy or Health Dept. Verbalized acceptance and understanding.  Screening Tests Health Maintenance  Topic Date Due   COVID-19 Vaccine (1) Never done   Hepatitis C Screening  Never done   Zoster Vaccines- Shingrix (1 of 2) Never done   TETANUS/TDAP  03/11/2012   COLONOSCOPY (Pts 45-2yrs Insurance coverage will need to be confirmed)  07/02/2020   INFLUENZA VACCINE  08/11/2020   PNA vac Low Risk Adult  Completed   HPV VACCINES  Aged Out    Health Maintenance  Health Maintenance Due  Topic Date Due   COVID-19 Vaccine (1) Never done   Hepatitis C Screening  Never done   Zoster Vaccines- Shingrix (1 of 2) Never done   TETANUS/TDAP  03/11/2012   COLONOSCOPY (Pts 45-108yrs Insurance coverage will need to be confirmed)  07/02/2020    Colorectal cancer screening: Referral to GI placed 07/29/2020. Pt aware the office will call re: appt.  Lung Cancer Screening: (Low Dose CT Chest recommended if Age 37-80 years, 30 pack-year currently smoking OR have quit w/in 15years.) does not qualify.   Lung Cancer Screening Referral: n/a  Additional Screening:  Hepatitis C Screening: does qualify  Vision Screening: Recommended annual ophthalmology exams for early detection of glaucoma and other disorders of the eye. Is the patient up to date with their annual eye exam?  Yes  Who is the provider or what is the name of the office in which the patient attends annual eye exams? Dr.Nice  If pt is not established with a provider, would they like to be referred to a provider to establish care? No .   Dental Screening: Recommended annual dental exams for proper oral hygiene  Community Resource Referral / Chronic Care Management: CRR required this visit?  No   CCM required this visit?  No      Plan:     I have personally reviewed and noted the following in the patient's chart:   Medical and social history Use of alcohol,  tobacco or illicit drugs  Current medications and supplements including opioid prescriptions. Patient is not currently taking opioid prescriptions. Functional ability and status Nutritional status Physical activity Advanced directives List of other physicians Hospitalizations, surgeries, and ER visits in previous 12 months Vitals Screenings to include cognitive, depression, and falls Referrals and appointments  In addition, I have reviewed and discussed with patient certain preventive protocols, quality metrics, and best practice recommendations. A written personalized care plan for preventive services as well as general preventive health recommendations were provided to patient.     Randel Pigg, LPN   04/08/9240   Nurse Notes: none

## 2020-08-04 DIAGNOSIS — R310 Gross hematuria: Secondary | ICD-10-CM | POA: Diagnosis not present

## 2020-08-04 DIAGNOSIS — N201 Calculus of ureter: Secondary | ICD-10-CM | POA: Diagnosis not present

## 2020-08-06 ENCOUNTER — Other Ambulatory Visit: Payer: Self-pay | Admitting: Urology

## 2020-08-06 ENCOUNTER — Telehealth: Payer: Self-pay | Admitting: Family Medicine

## 2020-08-06 DIAGNOSIS — N201 Calculus of ureter: Secondary | ICD-10-CM

## 2020-08-06 NOTE — Telephone Encounter (Signed)
Steven [name] form [facility] call and stated she want to know can they hold the xarelto for 72 hr before surgery because he is having a Lithotripsy.Steven [name] want a call back her # is [phone] and ext [phone].

## 2020-08-06 NOTE — Telephone Encounter (Signed)
Yes they can hold this for 72 hours

## 2020-08-06 NOTE — Telephone Encounter (Signed)
Please advise 

## 2020-08-07 NOTE — Telephone Encounter (Signed)
Called lvm for Steven [name] informing her of message from MD.

## 2020-08-08 ENCOUNTER — Other Ambulatory Visit: Payer: Self-pay | Admitting: Urology

## 2020-08-11 NOTE — Progress Notes (Signed)
Pre op phone call completed. PT aware he is to stop Xarelto. Arrive at [facility] [employer]. NPO after midnight.

## 2020-08-13 NOTE — H&P (Signed)
F/u -   1) h/o kidney stones - CT 2019 with 5 mm left renal stone. KUB 01/21 stable 4 mm LUP stone. No recent flank pain or stone passage.   2) MH - He had flank pain, bilateral and left inguinal and testicle pain. Remote smoker. No chemical exposure or chemo/xrt. AUASS = 6. PSA was 0.55 in Oct 2018. Brothers have prostate cancer. He has h/o DVT and PE in 2018 and is on blood thinners.   Imaging - CT 2019 benign. Mild bladder wall thickening.   Cysto benign 07/2017 and repeat 02/21 - benign (BPH - good PUL or WVT).   F/u UA clear. PSA was 1.43. Cr 1.3 -- 11/13/2018 labs. His 11/21 PSA was 0.55. UA clear today, 03/22.   07/21/2020: Last seen in March 2022. 3 month f/u recommended with KUB. Seen today for evaluation of gross hematuria. His PCP has him taking Bactrim since the 8th of this month. He was not seen in clinic but provided a UA which showed 21-50 rbc's hpf and few bacteria. Urine c/s showed mixed growth only. He is taking anticoagulation therapy in the form of Xarelto.   He tells me current symptoms began late last week with passage of gross hematuria, not associated with clot or tissue material passage, stone material passage. He has also had some intermittent left lower back and flank pain/discomfort with radiation into the left lower quadrant of the abdomen. Currently asymptomatic but did have some burning with urination when providing a urine specimen today. Denies changes in force of stream or bothersome increase in urinary urgency from baseline. Not associated with fevers or chills, nausea/vomiting. He continues Bactrim for today's appointment. Urinalysis is clear today.   08/04/2020: KUB last office visit showed previously identified left renal calculi to have transitioned into the left proximal ureter. He returns today for f/u exam. He has completed previously prescribed antimicrobial treatment by his primary care provider. Urine culture assessed here last office visit was negative for  bacterial growth. He continues to intermittently see gross hematuria, described as a mild amount clot or tissue material. Denies interval stone material passage. He has not had an acute exacerbation of pain but does endorse some intermittent discomfort in the left lower back radiating into the left flank. He has not had any significant burning or painful urination. No bothersome increase in frequency/urgency baseline. He denies interval fevers or chills, nausea/vomiting.     ALLERGIES: Amitriptyline HCl TABS Flexeril Morphine Sulfate (PF) SOLN    MEDICATIONS: Tamsulosin Hcl 0.4 mg capsule 1 capsule PO Daily Take 30 minutes after evening meal.  Aciphex 20 mg tablet, delayed release  Fenofibrate Q000111Q mg tablet  Folic Acid 1 mg tablet  Hydrocodone-Acetaminophen 5 mg-325 mg tablet 1 tablet PO Q 6 H PRN  Losartan Potassium 50 mg tablet  Temazepam 30 mg capsule PRN  Xarelto 20 mg tablet  Zetia 10 mg tablet     GU PSH: Cystoscopy - 03/06/2019, 2019 Cystoscopy Insert Stent - 2010 ESWL - 2010 Locm 300-'399Mg'$ /Ml Iodine,1Ml - 2019       PSH Notes: Cholecystectomy, Lithotripsy, Cystoscopy With Insertion Of Ureteral Stent Left, Total Disc Arthroplasty Cervical, Nose Surgery   NON-GU PSH: Cholecystectomy (open) - 2011     GU PMH: Gross hematuria - 07/21/2020 Ureteral calculus (Stable) - 07/21/2020, Proximal Ureteral Stone On The Left, - 2014, Calculus of ureter, - 2014 BPH w/o LUTS - 03/24/2020, BPH without urinary obstruction, - 2017 Microscopic hematuria, Urine without mH today - 03/24/2020, normal cystoscopy. , -  03/06/2019, UA today clear - he asked about repeat cystoscopy. His brothers had bladder ca. Discussed with UA clear and no gross hematuria he doesn't need cysto, but he wants to f/u for it. , - 02/07/2019, - 2019, - 2019 Renal calculus, CJeck KUB in a few months. - 03/24/2020, (Stable), - 2019, Nephrolithiasis, - 2017 Urinary Hesitancy (Stable) - 02/07/2019, - 2019 BPH w/LUTS, BPH with  obstruction/lower urinary tract symptoms - 2016, Benign prostatic hyperplasia with urinary obstruction, - 2015 Hydronephrosis Unspec, Hydronephrosis On The Left - 2014 LLQ pain, Abdominal pain, LLQ (left lower quadrant) - 2014 Other microscopic hematuria, Microscopic hematuria - 2014 Renal cyst, Renal cyst, acquired - 2014 Urinary Tract Inf, Unspec site, Pyuria - 2014      PMH Notes:  2008-09-20 10:14:02 - Note: Thrombophlebitis Of Deep Vessels Of The Lower Extremity  2008-10-24 09:57:11 - Note: Flank Pain Left   NON-GU PMH: Encounter for general adult medical examination without abnormal findings, Encounter for preventive health examination - 2016 Personal history of other endocrine, nutritional and metabolic disease, History of hypercholesterolemia - 2014 Personal history of other specified conditions, History of heartburn - 2014    FAMILY HISTORY: Colon Cancer - Father Death In The Family Father - Brother Death In The Family Mother - Brother nephrolithiasis - Brother   SOCIAL HISTORY: Marital Status: Married Preferred Language: English; Ethnicity: Not Hispanic Or Latino; Race: White Current Smoking Status: Patient does not smoke anymore. Has not smoked since 06/11/1977.   Tobacco Use Assessment Completed: Used Tobacco in last 30 days? Has never drank.  Drinks 2 caffeinated drinks per day.     Notes: Former smoker, Caffeine Use, Alcohol Use, Marital History - Currently Married, Tobacco Use, Occupation:   REVIEW OF SYSTEMS:    GU Review Male:   Patient denies frequent urination, hard to postpone urination, burning/ pain with urination, get up at night to urinate, leakage of urine, stream starts and stops, trouble starting your stream, have to strain to urinate , erection problems, and penile pain.  Gastrointestinal (Upper):   Patient denies nausea, vomiting, and indigestion/ heartburn.  Gastrointestinal (Lower):   Patient denies diarrhea and constipation.  Constitutional:   Patient  denies fever, night sweats, weight loss, and fatigue.  Skin:   Patient denies skin rash/ lesion and itching.  Eyes:   Patient denies blurred vision and double vision.  Ears/ Nose/ Throat:   Patient denies sore throat and sinus problems.  Hematologic/Lymphatic:   Patient denies swollen glands and easy bruising.  Cardiovascular:   Patient denies leg swelling and chest pains.  Respiratory:   Patient denies cough and shortness of breath.  Endocrine:   Patient denies excessive thirst.  Musculoskeletal:   Patient denies back pain and joint pain.  Neurological:   Patient denies headaches and dizziness.  Psychologic:   Patient denies depression and anxiety.   VITAL SIGNS:      08/04/2020 08:14 AM  Weight 190 lb / 86.18 kg  Height 70 in / 177.8 cm  BP 127/72 mmHg  Pulse 66 /min  BMI 27.3 kg/m   MULTI-SYSTEM PHYSICAL EXAMINATION:    Constitutional: Well-nourished. No physical deformities. Normally developed. Good grooming.  Neck: Neck symmetrical, not swollen. Normal tracheal position.  Respiratory: No labored breathing, no use of accessory muscles.   Cardiovascular: Normal temperature, normal extremity pulses, no swelling, no varicosities.  Skin: No paleness, no jaundice, no cyanosis. No lesion, no ulcer, no rash.  Neurologic / Psychiatric: Oriented to time, oriented to place, oriented to person.  No depression, no anxiety, no agitation.  Gastrointestinal: No mass, no tenderness, no rigidity, non obese abdomen. No CVA or flank tenderness.   Musculoskeletal: Normal gait and station of head and neck.     Complexity of Data:  Source Of History:  Patient, Family/Caregiver, Medical Record Summary  Records Review:   Previous Doctor Records, Previous Hospital Records, Previous Patient Records  Urine Test Review:   Urinalysis, Urine Culture  X-Ray Review: KUB: Reviewed Films. Discussed With Patient.     07/02/14 06/21/13 06/12/12 09/20/03  PSA  Total PSA 0.63  1.31  2.28  0.44     08/04/20   Urinalysis  Urine Appearance Clear   Urine Color Yellow   Urine Glucose Neg mg/dL  Urine Bilirubin Neg mg/dL  Urine Ketones Neg mg/dL  Urine Specific Gravity 1.015   Urine Blood 3+ ery/uL  Urine pH 6.0   Urine Protein Neg mg/dL  Urine Urobilinogen 0.2 mg/dL  Urine Nitrites Neg   Urine Leukocyte Esterase Neg leu/uL  Urine WBC/hpf 0 - 5/hpf   Urine RBC/hpf 10 - 20/hpf   Urine Epithelial Cells NS (Not Seen)   Urine Bacteria NS (Not Seen)   Urine Mucous Not Present   Urine Yeast NS (Not Seen)   Urine Trichomonas Not Present   Urine Cystals NS (Not Seen)   Urine Casts NS (Not Seen)   Urine Sperm Not Present    PROCEDURES:         KUB - [id number]  A single view of the abdomen is obtained. A 3-4 mm opacity consistent with a left proximal ureteral calculi continues to be easily visualized on today's KUB study. No other obvious opacity consistent with ureteral calculi was easily or definitively identified on today's exam. Bladder grossly appears free of obstruction.      Patient confirmed No Neulasta OnPro Device.           Urinalysis w/Scope Dipstick Dipstick Cont'd Micro  Color: Yellow Bilirubin: Neg mg/dL WBC/hpf: 0 - 5/hpf  Appearance: Clear Ketones: Neg mg/dL RBC/hpf: 10 - 20/hpf  Specific Gravity: 1.015 Blood: 3+ ery/uL Bacteria: NS (Not Seen)  pH: 6.0 Protein: Neg mg/dL Cystals: NS (Not Seen)  Glucose: Neg mg/dL Urobilinogen: 0.2 mg/dL Casts: NS (Not Seen)    Nitrites: Neg Trichomonas: Not Present    Leukocyte Esterase: Neg leu/uL Mucous: Not Present      Epithelial Cells: NS (Not Seen)      Yeast: NS (Not Seen)      Sperm: Not Present    ASSESSMENT:      ICD-10 Details  1 GU:   Ureteral calculus - N20.1 Left, Acute, Complicated Injury  2   Gross hematuria - 123456 Acute, Complicated Injury   PLAN:           Orders Labs Urine Culture  X-Rays: KUB          Schedule Return Visit/Planned Activity: Next Available Appointment - Schedule Surgery           Document Letter(s):  Created for Patient: Clinical Summary         Notes:   Left proximal ureteral calculi remains grossly unchanged in its location when previously identified on last KUB study. Patient has had lithotripsy in the past and the stones visibility today makes him a good candidate for such. At this point I recommended we proceed with definitive stone management. He is in agreement. We will need to get clearance from his primary care provider for the patient to discontinue Xarelto  for the procedure. I will also consult with his primary urologist regarding treatment recommendation today.   For shockwave lithotripsy I described the risks which include arrhythmia, kidney contusion, kidney hemorrhage, need for transfusion, long-term risk of diabetes or hypertension, back discomfort, flank ecchymosis, flank abrasion, inability to break up stone, inability to pass stone fragments, Steinstrasse, infection associated with obstructing stones, need for different surgical procedure and possible need for repeat shockwave lithotripsy.   Precautionary urine culture sent today. He will remain on tamsulosin. Patient understands to remain well hydrated, remain active as possible and monitor for any new or worsening symptomatology including stone material passage. Appropriate return to clinic or emergency department follow-up instructions given in regards to this.   Once reviewed with his primary urologist, the patient will be contacted and scheduled

## 2020-08-14 ENCOUNTER — Other Ambulatory Visit: Payer: Self-pay

## 2020-08-14 ENCOUNTER — Ambulatory Visit (HOSPITAL_COMMUNITY): Payer: PPO

## 2020-08-14 ENCOUNTER — Encounter (HOSPITAL_BASED_OUTPATIENT_CLINIC_OR_DEPARTMENT_OTHER): Payer: Self-pay | Admitting: Urology

## 2020-08-14 ENCOUNTER — Encounter (HOSPITAL_BASED_OUTPATIENT_CLINIC_OR_DEPARTMENT_OTHER)
Admission: RE | Disposition: A | Discharge status: Home / Self Care | Payer: Self-pay | Source: Home / Self Care | Attending: Urology

## 2020-08-14 ENCOUNTER — Ambulatory Visit (HOSPITAL_BASED_OUTPATIENT_CLINIC_OR_DEPARTMENT_OTHER)
Admission: RE | Admit: 2020-08-14 | Discharge: 2020-08-14 | Disposition: A | Discharge status: Home / Self Care | Payer: PPO | Attending: Urology | Admitting: Urology

## 2020-08-14 DIAGNOSIS — Z885 Allergy status to narcotic agent status: Secondary | ICD-10-CM | POA: Insufficient documentation

## 2020-08-14 DIAGNOSIS — Z888 Allergy status to other drugs, medicaments and biological substances status: Secondary | ICD-10-CM | POA: Insufficient documentation

## 2020-08-14 DIAGNOSIS — I878 Other specified disorders of veins: Secondary | ICD-10-CM | POA: Diagnosis not present

## 2020-08-14 DIAGNOSIS — Z86718 Personal history of other venous thrombosis and embolism: Secondary | ICD-10-CM | POA: Insufficient documentation

## 2020-08-14 DIAGNOSIS — N132 Hydronephrosis with renal and ureteral calculous obstruction: Secondary | ICD-10-CM | POA: Diagnosis not present

## 2020-08-14 DIAGNOSIS — Z841 Family history of disorders of kidney and ureter: Secondary | ICD-10-CM | POA: Diagnosis not present

## 2020-08-14 DIAGNOSIS — Z86711 Personal history of pulmonary embolism: Secondary | ICD-10-CM | POA: Diagnosis not present

## 2020-08-14 DIAGNOSIS — N201 Calculus of ureter: Secondary | ICD-10-CM

## 2020-08-14 DIAGNOSIS — Z01818 Encounter for other preprocedural examination: Secondary | ICD-10-CM | POA: Diagnosis not present

## 2020-08-14 DIAGNOSIS — Z87891 Personal history of nicotine dependence: Secondary | ICD-10-CM | POA: Insufficient documentation

## 2020-08-14 DIAGNOSIS — Z79899 Other long term (current) drug therapy: Secondary | ICD-10-CM | POA: Insufficient documentation

## 2020-08-14 HISTORY — PX: EXTRACORPOREAL SHOCK WAVE LITHOTRIPSY: SHX1557

## 2020-08-14 SURGERY — LITHOTRIPSY, ESWL
Anesthesia: LOCAL | Laterality: Left

## 2020-08-14 MED ORDER — DIPHENHYDRAMINE HCL 25 MG PO CAPS
25.0000 mg | ORAL_CAPSULE | ORAL | Status: AC
  Administered 2020-08-14: 25 mg via ORAL

## 2020-08-14 MED ORDER — DIAZEPAM 5 MG PO TABS
ORAL_TABLET | ORAL | Status: AC
  Filled 2020-08-14: qty 2

## 2020-08-14 MED ORDER — SODIUM CHLORIDE 0.9 % IV SOLN: INTRAVENOUS | Status: DC

## 2020-08-14 MED ORDER — DIPHENHYDRAMINE HCL 25 MG PO CAPS
ORAL_CAPSULE | ORAL | Status: AC
  Filled 2020-08-14: qty 1

## 2020-08-14 MED ORDER — DIAZEPAM 5 MG PO TABS
10.0000 mg | ORAL_TABLET | ORAL | Status: AC
  Administered 2020-08-14: 10 mg via ORAL

## 2020-08-14 NOTE — Brief Op Note (Signed)
08/14/2020  9:48 AM  PATIENT:  Steven [name]  70 y.o. male  PRE-OPERATIVE DIAGNOSIS:  LEFT URETERAL STONE  POST-OPERATIVE DIAGNOSIS:  * No post-op diagnosis entered *  PROCEDURE:  Procedure(s): LEFT EXTRACORPOREAL SHOCK WAVE LITHOTRIPSY (ESWL) (Left)  SURGEON:  Surgeon(s) and Role:    * [name], MD - Primary  PHYSICIAN ASSISTANT:   ASSISTANTS: none   ANESTHESIA:   IV sedation  EBL:  minimal   BLOOD ADMINISTERED:none  DRAINS: none   LOCAL MEDICATIONS USED:  NONE  SPECIMEN:  No Specimen  DISPOSITION OF SPECIMEN:  N/A  COUNTS:  YES  TOURNIQUET:  * No tourniquets in log *  DICTATION: .Note written in EPIC  PLAN OF CARE: Discharge to home after PACU  PATIENT DISPOSITION:  PACU - hemodynamically stable.   Delay start of Pharmacological VTE agent (>24hrs) due to surgical blood loss or risk of bleeding: not applicable

## 2020-08-14 NOTE — Op Note (Addendum)
08/14/2020   9:48 AM   PATIENT:  Steven [name]  70 y.o. male   PRE-OPERATIVE DIAGNOSIS:  LEFT URETERAL STONE   POST-OPERATIVE DIAGNOSIS:  Same   PROCEDURE:  Procedure(s): LEFT EXTRACORPOREAL SHOCK WAVE LITHOTRIPSY (ESWL) (Left)   SURGEON:  Surgeon(s) and Role:    * [name], MD - Primary   PHYSICIAN ASSISTANT:   ASSISTANTS: none    ANESTHESIA:   IV sedation   EBL:  minimal    BLOOD ADMINISTERED:none   DRAINS: none    LOCAL MEDICATIONS USED:  NONE   SPECIMEN:  No Specimen   DISPOSITION OF SPECIMEN:  N/A   COUNTS:  YES   TOURNIQUET:  * No tourniquets in log *   DICTATION: .Note written in EPIC   PLAN OF CARE: Discharge to home after PACU   PATIENT DISPOSITION:  PACU - hemodynamically stable.   Delay start of Pharmacological VTE agent (>24hrs) due to surgical blood loss or risk of bleeding: not applicable

## 2020-08-14 NOTE — Interval H&P Note (Signed)
History and Physical Interval Note:  08/14/2020 7:46 AM  Steven [name]  has presented today for surgery, with the diagnosis of LEFT URETERAL STONE.  The various methods of treatment have been discussed with the patient and family. After consideration of risks, benefits and other options for treatment, the patient has consented to  Procedure(s): LEFT EXTRACORPOREAL SHOCK WAVE LITHOTRIPSY (ESWL) (Left) as a surgical intervention.  The patient's history has been reviewed, patient examined, no change in status, stable for surgery.  I have reviewed the patient's chart and labs.  Questions were answered to the patient's satisfaction.     [name]

## 2020-08-14 NOTE — Interval H&P Note (Signed)
History and Physical Interval Note:  08/14/2020 11:04 AM  Steven [name]  has presented today for surgery, with the diagnosis of LEFT URETERAL STONE.  The various methods of treatment have been discussed with the patient and family. After consideration of risks, benefits and other options for treatment, the patient has consented to  Procedure(s): LEFT EXTRACORPOREAL SHOCK WAVE LITHOTRIPSY (ESWL) (Left) as a surgical intervention.  The patient's history has been reviewed, patient examined, no change in status, stable for surgery.  I have reviewed the patient's chart and labs.  Questions were answered to the patient's satisfaction.     [name]

## 2020-08-15 ENCOUNTER — Encounter (HOSPITAL_BASED_OUTPATIENT_CLINIC_OR_DEPARTMENT_OTHER): Payer: Self-pay | Admitting: Urology

## 2020-08-27 ENCOUNTER — Telehealth: Payer: Self-pay | Admitting: Pharmacist

## 2020-08-27 NOTE — Chronic Care Management (AMB) (Unsigned)
Chronic Care Management Pharmacy Assistant   Name: Steven [name]  MRN: [id number] DOB: [birth year]   Reason for Encounter: Disease State/Hypertension Call   Conditions to be addressed/monitored: HTN   Recent office visits:  None noted  Recent consult visits:  None noted  Hospital visits:  None in previous 6 months  Medications: Outpatient Encounter Medications as of 08/27/2020  Medication Sig   acyclovir cream (ZOVIRAX) 5 % Apply 1 application topically daily as needed (fever blisters).   albuterol (VENTOLIN HFA) 108 (90 Base) MCG/ACT inhaler Inhale 2 puffs into the lungs every 4 (four) hours as needed for wheezing or shortness of breath. (Patient not taking: Reported on 07/29/2020)   eletriptan (RELPAX) 20 MG tablet Take 1 tablet (20 mg total) by mouth as needed for migraine.   ezetimibe (ZETIA) 10 MG tablet Take 1 tablet (10 mg total) by mouth daily.   fenofibrate (TRICOR) 145 MG tablet Take 1 tablet (145 mg total) by mouth every evening.   folic acid (FOLVITE) 1 MG tablet Take 1 mg by mouth daily.   hydrocortisone-pramoxine (PROCTOFOAM HC) rectal foam Place 1 applicator rectally 2 (two) times daily.   losartan (COZAAR) 50 MG tablet Take 1 tablet (50 mg total) by mouth daily.   RABEprazole (ACIPHEX) 20 MG tablet Take 1 tablet (20 mg total) by mouth 2 (two) times daily.   rivaroxaban (XARELTO) 20 MG TABS tablet Take 1 tablet (20 mg total) by mouth daily with supper.   temazepam (RESTORIL) 30 MG capsule Take 1 capsule (30 mg total) by mouth at bedtime as needed for sleep.   No facility-administered encounter medications on file as of 08/27/2020.   Fill History: EZETIMIBE '10MG'$  TABLET 06/12/2020 90   FENOFIBRATE '145MG'$  TABLET 06/05/2020 90   LOSARTAN POTASSIUM '50MG'$  TABLET 06/05/2020 90   RABEPRAZOLE SODIUM '20MG'$  TABLET DELAYED RELEASE 06/12/2020 90   XARELTO '20MG'$  TABLET 01/16/2020 90    Reviewed chart prior to disease state call. Spoke with patient regarding  BP  Recent Office Vitals: BP Readings from Last 3 Encounters:  08/14/20 134/78  04/22/20 118/76  03/26/20 124/76   Pulse Readings from Last 3 Encounters:  08/14/20 61  04/22/20 70  03/26/20 60    Wt Readings from Last 3 Encounters:  08/14/20 189 lb (85.7 kg)  04/22/20 191 lb 3.2 oz (86.7 kg)  03/26/20 192 lb (87.1 kg)     Kidney Function Lab Results  Component Value Date/Time   CREATININE 1.48 (H) 02/01/2020 12:13 PM   CREATININE 1.36 (H) 11/14/2019 10:57 AM   CREATININE 1.30 11/13/2018 08:52 AM   GFR 54.57 (L) 11/13/2018 08:52 AM   GFRNONAA 47 (L) 02/01/2020 12:13 PM   GFRNONAA 52 (L) 11/14/2019 10:57 AM   GFRAA 54 (L) 02/01/2020 12:13 PM   GFRAA 60 11/14/2019 10:57 AM    BMP Latest Ref Rng & Units 02/01/2020 11/14/2019 11/13/2018  Glucose 65 - 99 mg/dL 77 92 102(H)  BUN 8 - 27 mg/dL '17 18 19  '$ Creatinine 0.76 - 1.27 mg/dL 1.48(H) 1.36(H) 1.30  BUN/Creat Ratio 10 - '24 11 13 '$ -  Sodium 134 - 144 mmol/L 142 141 140  Potassium 3.5 - 5.2 mmol/L 4.8 4.6 4.2  Chloride 96 - 106 mmol/L 104 104 104  CO2 20 - 29 mmol/L '24 29 28  '$ Calcium 8.6 - 10.2 mg/dL 10.1 10.2 9.5    Current antihypertensive regimen:  losartan 50 mg 1 tablet daily  How often are you checking your Blood Pressure? {CHL HP BP Monitoring  [phone]  Current home BP readings: ***  What recent interventions/DTPs have been made by any provider to improve Blood Pressure control since last CPP Visit: None noted  Any recent hospitalizations or ED visits since last visit with CPP? {yes/no:20286}  What diet changes have been made to improve Blood Pressure Control?  ***  What exercise is being done to improve your Blood Pressure Control?  ***  Adherence Review: Is the patient currently on ACE/ARB medication? Yes Does the patient have >5 day gap between last estimated fill dates? {yes/no:20286}  [facility] pharmacy to confirm last fill date for Xarelto. Per representative the patient last filled  prescription 01/16/20 90 DS. [facility] to confirm last fill for Xarelto. Per the representative, patient has not filled this prescription at their location.  Care Gaps: AWV completed 07/29/20 COVID-19 Vaccine over due Hepatitis C screening Zoster Vaccines due Tetanus/TDAP last 03/12/2002 Colonoscopy last completed 07/03/2015 Influenza due  Star Rating Drugs: Losartan 50 mg last filled 06/05/20 90 DS  [facility] Clinical Pharmacist Assistant [phone]

## 2020-09-04 DIAGNOSIS — N201 Calculus of ureter: Secondary | ICD-10-CM | POA: Diagnosis not present

## 2020-10-08 ENCOUNTER — Telehealth: Payer: Self-pay | Admitting: Pharmacist

## 2020-10-08 NOTE — Chronic Care Management (AMB) (Signed)
    Chronic Care Management Pharmacy Assistant   Name: Steven [name]  MRN: [id number] DOB: [birth year]  Spoke with patients wife Steven [name] and rescheduled patients December follow up with Jeni Salles to January 2023. Beverly aware and agreeable to new appointment date and time. Steven [name] thanked me for my call.   Medications: Outpatient Encounter Medications as of 10/08/2020  Medication Sig   acyclovir cream (ZOVIRAX) 5 % Apply 1 application topically daily as needed (fever blisters).   albuterol (VENTOLIN HFA) 108 (90 Base) MCG/ACT inhaler Inhale 2 puffs into the lungs every 4 (four) hours as needed for wheezing or shortness of breath. (Patient not taking: Reported on 07/29/2020)   eletriptan (RELPAX) 20 MG tablet Take 1 tablet (20 mg total) by mouth as needed for migraine.   ezetimibe (ZETIA) 10 MG tablet Take 1 tablet (10 mg total) by mouth daily.   fenofibrate (TRICOR) 145 MG tablet Take 1 tablet (145 mg total) by mouth every evening.   folic acid (FOLVITE) 1 MG tablet Take 1 mg by mouth daily.   hydrocortisone-pramoxine (PROCTOFOAM HC) rectal foam Place 1 applicator rectally 2 (two) times daily.   losartan (COZAAR) 50 MG tablet Take 1 tablet (50 mg total) by mouth daily.   RABEprazole (ACIPHEX) 20 MG tablet Take 1 tablet (20 mg total) by mouth 2 (two) times daily.   rivaroxaban (XARELTO) 20 MG TABS tablet Take 1 tablet (20 mg total) by mouth daily with supper.   temazepam (RESTORIL) 30 MG capsule Take 1 capsule (30 mg total) by mouth at bedtime as needed for sleep.   No facility-administered encounter medications on file as of 10/08/2020.    Care Gaps:  AWV - completed on 07/29/20 Covid-19 vaccine - never done  Hepatitis C screening - never done Zoster vaccines - never done Tetanus/TDAP - overdue since 03/11/12 Colonoscopy - overdue since 07/02/20 Flu vaccine - due  Star Rating Drugs:  Losartan 50mg  - last filled on 09/17/20 90DS at [facility] [phone]

## 2020-10-13 DIAGNOSIS — H3561 Retinal hemorrhage, right eye: Secondary | ICD-10-CM | POA: Diagnosis not present

## 2020-10-13 DIAGNOSIS — H11042 Peripheral pterygium, stationary, left eye: Secondary | ICD-10-CM | POA: Diagnosis not present

## 2020-10-13 DIAGNOSIS — G43809 Other migraine, not intractable, without status migrainosus: Secondary | ICD-10-CM | POA: Diagnosis not present

## 2020-10-13 DIAGNOSIS — H2513 Age-related nuclear cataract, bilateral: Secondary | ICD-10-CM | POA: Diagnosis not present

## 2020-10-13 DIAGNOSIS — H43812 Vitreous degeneration, left eye: Secondary | ICD-10-CM | POA: Diagnosis not present

## 2020-10-13 DIAGNOSIS — H40013 Open angle with borderline findings, low risk, bilateral: Secondary | ICD-10-CM | POA: Diagnosis not present

## 2020-10-14 ENCOUNTER — Telehealth: Payer: Self-pay | Admitting: Pharmacist

## 2020-10-14 NOTE — Chronic Care Management (AMB) (Signed)
Chronic Care Management Pharmacy Assistant   Name: Steven [name]  MRN: [id number] DOB: Jan 19, 1950  Reason for Encounter: Disease State / Hyperlipidemia Assessment Call   Conditions to be addressed/monitored: HLD   Recent office visits:  None  Recent consult visits:  None  Hospital visits:  None in previous 6 months  Medications: Outpatient Encounter Medications as of 10/14/2020  Medication Sig   acyclovir cream (ZOVIRAX) 5 % Apply 1 application topically daily as needed (fever blisters).   albuterol (VENTOLIN HFA) 108 (90 Base) MCG/ACT inhaler Inhale 2 puffs into the lungs every 4 (four) hours as needed for wheezing or shortness of breath. (Patient not taking: Reported on 07/29/2020)   eletriptan (RELPAX) 20 MG tablet Take 1 tablet (20 mg total) by mouth as needed for migraine.   ezetimibe (ZETIA) 10 MG tablet Take 1 tablet (10 mg total) by mouth daily.   fenofibrate (TRICOR) 145 MG tablet Take 1 tablet (145 mg total) by mouth every evening.   folic acid (FOLVITE) 1 MG tablet Take 1 mg by mouth daily.   hydrocortisone-pramoxine (PROCTOFOAM HC) rectal foam Place 1 applicator rectally 2 (two) times daily.   losartan (COZAAR) 50 MG tablet Take 1 tablet (50 mg total) by mouth daily.   RABEprazole (ACIPHEX) 20 MG tablet Take 1 tablet (20 mg total) by mouth 2 (two) times daily.   rivaroxaban (XARELTO) 20 MG TABS tablet Take 1 tablet (20 mg total) by mouth daily with supper.   temazepam (RESTORIL) 30 MG capsule Take 1 capsule (30 mg total) by mouth at bedtime as needed for sleep.   No facility-administered encounter medications on file as of 10/14/2020.   Fill History:  ALBUTEROL HFA 90 MCG INHALER 04/22/2020 1   EZETIMIBE 10MG  TABLET 09/17/2020 90   FENOFIBRATE 145MG  TABLET 06/05/2020 90   LOSARTAN POTASSIUM 50MG  TABLET 09/17/2020 90   RABEPRAZOLE SODIUM 20MG  TABLET DELAYED RELEASE 09/17/2020 90   XARELTO 20MG  TABLET 01/16/2020 90  10/14/2020 Name: Steven [name] MRN:  [id number] DOB: 1950/12/09 Steven [name] is a 70 y.o. year old male who is a primary care patient of Laurey Morale, MD.  Comprehensive medication review performed; Spoke to patient regarding cholesterol  Lipid Panel    Component Value Date/Time   CHOL 195 11/14/2019 1057   TRIG 144 11/14/2019 1057   HDL 48 11/14/2019 1057   LDLCALC 121 (H) 11/14/2019 1057   LDLDIRECT 143.6 08/28/2012 0915    10-year ASCVD risk score: The 10-year ASCVD risk score (Arnett DK, et al., 2019) is: 24%   Values used to calculate the score:     Age: 79 years     Sex: Male     Is Non-Hispanic African American: No     Diabetic: No     Tobacco smoker: No     Systolic Blood Pressure: 528 mmHg     Is BP treated: Yes     HDL Cholesterol: 48 mg/dL     Total Cholesterol: 195 mg/dL  Current antihyperlipidemic regimen:  Tricor 145 mg take 1 tablet daily. Zetia 10 mg take 1 tablet daily  Previous antihyperlipidemic medications tried: None  ASCVD risk enhancing conditions: age >37 and HTN  What recent interventions/DTPs have been made by any provider to improve Cholesterol control since last CPP Visit: None  Any recent hospitalizations or ED visits since last visit with CPP? No   Adherence Review: Does the patient have >5 day gap between last estimated fill dates? No  Care Gaps: AWV  completed 07/29/20 COVID-19 Vaccine over due Hepatitis C screening Zoster Vaccines due Tetanus/TDAP last 03/12/2002 Colonoscopy last completed 07/03/2015 Influenza due Last BP reading on 08/14/2020 was 134/78  Star Rating Drugs: Losartan 50 mg last filled 09/17/2020 90DS at [place]

## 2020-10-20 ENCOUNTER — Telehealth: Payer: Self-pay | Admitting: Gastroenterology

## 2020-10-20 NOTE — Telephone Encounter (Signed)
Inbound call from pt's wife stating that the pt is having trouble swallowing. Please advise. Thank you.

## 2020-10-20 NOTE — Telephone Encounter (Signed)
Left message on machine to call back  

## 2020-10-20 NOTE — Telephone Encounter (Signed)
The pt has been advised that he needs first available office visit.  The appt has been made for 12/9.  He or his wife will call back to check if there have been any cancellations.

## 2020-10-20 NOTE — Telephone Encounter (Signed)
The pt has dysphagia to solids and occasional liquids.  His last testing was Esophageal manometry on 11/03/2018, EGD 10/17/2018.  He has been doing very well until recently.  He has an appt scheduled to see Dr Ardis Hughs on 12/9.  He has been advised to chew slowly, avoid tough meats, and take small bites.  Dr Ardis Hughs do you want to set up EGD directly? He is on xarelto.  Please advise.

## 2020-11-14 ENCOUNTER — Other Ambulatory Visit (INDEPENDENT_AMBULATORY_CARE_PROVIDER_SITE_OTHER): Payer: PPO

## 2020-11-14 ENCOUNTER — Encounter: Payer: Self-pay | Admitting: Physician Assistant

## 2020-11-14 ENCOUNTER — Ambulatory Visit: Payer: PPO | Admitting: Physician Assistant

## 2020-11-14 VITALS — BP 126/70 | HR 69 | Ht 69.0 in | Wt 189.5 lb

## 2020-11-14 DIAGNOSIS — K219 Gastro-esophageal reflux disease without esophagitis: Secondary | ICD-10-CM

## 2020-11-14 DIAGNOSIS — R1310 Dysphagia, unspecified: Secondary | ICD-10-CM

## 2020-11-14 DIAGNOSIS — K224 Dyskinesia of esophagus: Secondary | ICD-10-CM | POA: Diagnosis not present

## 2020-11-14 DIAGNOSIS — Z800 Family history of malignant neoplasm of digestive organs: Secondary | ICD-10-CM

## 2020-11-14 DIAGNOSIS — K625 Hemorrhage of anus and rectum: Secondary | ICD-10-CM

## 2020-11-14 LAB — CBC WITH DIFFERENTIAL/PLATELET
Basophils Absolute: 0 10*3/uL (ref 0.0–0.1)
Basophils Relative: 1 % (ref 0.0–3.0)
Eosinophils Absolute: 0.2 10*3/uL (ref 0.0–0.7)
Eosinophils Relative: 3.8 % (ref 0.0–5.0)
HCT: 41.2 % (ref 39.0–52.0)
Hemoglobin: 13.6 g/dL (ref 13.0–17.0)
Lymphocytes Relative: 27.8 % (ref 12.0–46.0)
Lymphs Abs: 1.4 10*3/uL (ref 0.7–4.0)
MCHC: 33.1 g/dL (ref 30.0–36.0)
MCV: 87 fl (ref 78.0–100.0)
Monocytes Absolute: 0.5 10*3/uL (ref 0.1–1.0)
Monocytes Relative: 10.6 % (ref 3.0–12.0)
Neutro Abs: 2.8 10*3/uL (ref 1.4–7.7)
Neutrophils Relative %: 56.8 % (ref 43.0–77.0)
Platelets: 229 10*3/uL (ref 150.0–400.0)
RBC: 4.74 Mil/uL (ref 4.22–5.81)
RDW: 13.1 % (ref 11.5–15.5)
WBC: 5 10*3/uL (ref 4.0–10.5)

## 2020-11-14 MED ORDER — HYDROCORTISONE ACETATE 25 MG RE SUPP: 25.0000 mg | Freq: Every evening | RECTAL | 2 refills | Status: AC

## 2020-11-14 MED ORDER — NA SULFATE-K SULFATE-MG SULF 17.5-3.13-1.6 GM/177ML PO SOLN: 1.0000 | Freq: Once | ORAL | 0 refills | Status: DC

## 2020-11-14 NOTE — Patient Instructions (Signed)
If you are age 72 or older, your body mass index should be between 23-30. Your Body mass index is 27.98 kg/m. If this is out of the aforementioned range listed, please consider follow up with your Primary Care Provider. ________________________________________________________  The [facility] providers would like to encourage you to use [facility] to communicate with providers for non-urgent requests or questions.  Due to long hold times on the telephone, sending your provider a message by [facility] may be a faster and more efficient way to get a response.  Please allow 48 business hours for a response.  Please remember that this is for non-urgent requests.  _______________________________________________________  Dennis Bast have been scheduled for an endoscopy and colonoscopy. Please follow the written instructions given to you at your visit today. Please pick up your prep supplies at the pharmacy within the next 1-3 days. If you use inhalers (even only as needed), please bring them with you on the day of your procedure.  Your provider has requested that you go to the basement level for lab work before leaving today. Press "B" on the elevator. The lab is located at the first door on the left as you exit the elevator.  START Hydrocortisone suppository place 1 in the rectum at bedtime for 7 days then as needed.  Continue Aciphex 20 mg 1 tablet twice daily.  Follow up pending at this time.  Thank you for entrusting me with your care and choosing [facility].  Amy Esterwood, PA-C

## 2020-11-14 NOTE — Progress Notes (Signed)
Subjective:    Patient ID: Steven [name], male    DOB: [birth year], 70 y.o.   MRN: [id number]  HPI Steven "[name]' is a pleasant 70 year old white male, established with Steven [name].  He comes in today with complaints of rectal bleeding and also persistent issues with dysphagia. 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 Last colonoscopy was done in 2017 for family history of colon cancer noted to have multiple diverticuli and a 5 mm polyp was removed from the ascending colon and not retrieved.  Indicated for 5-year interval follow-up. EGD in October 2020, with finding of small hiatal hernia another wise normal-appearing esophagus.  No dilation was done.  He subsequently had manometry which showed normal relaxation of the EG junction and a hypercontractile esophagus.  He has been continued on high-dose PPI, and was started on a trial of diltiazem 60 mg after the manometry. Patient says that he did not notice any difference with the diltiazem and stopped it.  He continues on Aciphex 20 mg twice daily. He had had several prior EGDs.  He had undergone Botox injections on at least 2 occasions in the past prior to having undergone the manometry.  Patient also feels that he has had prior esophageal dilations. He says that dilations and Botox injections have helped him, and he would like to have another endoscopy and consideration of dilation.  He has ongoing issues with the dysphagia which he says is gradually worsened over the past year or so He has symptoms with solids and liquids and says sometimes even with water his esophagus will "lock up" and feels very tight, he has to stop eating or drinking and weight for it to relax.  About 2 weeks ago he had an episode of rectal bleeding that was present just for 1 day with bright red blood, no abdominal pain or changes in bowel habits and bowel movements were  normal, no melena He noticed some mild anal rectal soreness around that time but no pain, and says he saw what seemed to be quite a bit of blood in the water.  He had another episode earlier this week, no blood noted today or yesterday.  Review of Systems Pertinent positive and negative review of systems were noted in the above HPI section.  All other review of systems was otherwise negative.   Outpatient Encounter Medications as of 11/14/2020  Medication Sig   acyclovir cream (ZOVIRAX) 5 % Apply 1 application topically daily as needed (fever blisters).   eletriptan (RELPAX) 20 MG tablet Take 1 tablet (20 mg total) by mouth as needed for migraine.   ezetimibe (ZETIA) 10 MG tablet Take 1 tablet (10 mg total) by mouth daily.   fenofibrate (TRICOR) 145 MG tablet Take 1 tablet (145 mg total) by mouth every evening.   folic acid (FOLVITE) 1 MG tablet Take 1 mg by mouth daily.   hydrocortisone (ANUSOL-HC) 25 MG suppository Place 1 suppository (25 mg total) rectally at bedtime for 7 days.   hydrocortisone-pramoxine (PROCTOFOAM HC) rectal foam Place 1 applicator rectally 2 (two) times daily.   losartan (COZAAR) 50 MG tablet Take 1 tablet (50 mg total) by mouth daily.   Na Sulfate-K Sulfate-Mg Sulf 17.5-3.13-1.6 GM/177ML SOLN Take 1 kit by mouth once for 1 dose.   RABEprazole (ACIPHEX) 20 MG tablet Take 1 tablet (20 mg total) by mouth 2 (two) times daily.   rivaroxaban (XARELTO) 20 MG TABS tablet Take 1  tablet (20 mg total) by mouth daily with supper.   temazepam (RESTORIL) 30 MG capsule Take 1 capsule (30 mg total) by mouth at bedtime as needed for sleep.   [DISCONTINUED] albuterol (VENTOLIN HFA) 108 (90 Base) MCG/ACT inhaler Inhale 2 puffs into the lungs every 4 (four) hours as needed for wheezing or shortness of breath. (Patient not taking: Reported on 11/14/2020)   No facility-administered encounter medications on file as of 11/14/2020.   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   Herpes zoster without complication 82/80/0349   Diverticulitis 03/29/2019   Esophageal dysphagia    Trigeminal neuralgia of right side of face 09/05/2017   DVT (deep venous thrombosis) ([place]) 07/22/2016   Renal insufficiency    Pulmonary emboli ([place]) 07/19/2016   Atypical chest pain 02/07/2013   HTN (hypertension) 05/10/2012   PERSONAL HX COLONIC POLYPS 01/30/2010   PULMONARY NODULE 17/91/5056   BILIARY COLIC 97/94/8016   Hereditary and idiopathic peripheral neuropathy 07/31/2008   KNEE PAIN 06/21/2007   Personal history of venous thrombosis and embolism 03/06/2007   Acute thromboembolism of deep veins of lower extremity ([place]) 03/01/2007   HERNIATED [place] 10/31/2006   FEVER BLISTER 10/24/2006   Hyperlipidemia 10/24/2006   GERD 10/24/2006   Migraine headache 10/24/2006   HIATAL HERNIA, HX OF 10/24/2006   Personal history of urinary calculi 10/24/2006   BENIGN PROSTATIC HYPERTROPHY, HX OF 10/24/2006   Social History   Socioeconomic History   Marital status: Married    Spouse name: Not on file   Number of children: Not on file   Years of education: Not on file   Highest education level: Not on file  Occupational History   Occupation: Retired  Tobacco Use   Smoking status: Former    Packs/day: 1.50    Years: 20.00    Pack years: 30.00    Types: Cigarettes    Quit date: 03/03/1973    Years since quitting: 47.7   Smokeless tobacco: Never   Tobacco comments:    discussed AAA; did have CT of abd/ pelvis   Vaping Use   Vaping Use: Never used  Substance and Sexual Activity   Alcohol use: No    Alcohol/week: 0.0 standard drinks   Drug use: No    Sexual activity: Not on file  Other Topics Concern   Not on file  Social History Narrative   Not on file   Social Determinants of Health   Financial Resource Strain: Low Risk    Difficulty of Paying Living Expenses: Not hard at all  Food Insecurity: No Food Insecurity   Worried About Charity fundraiser in the Last Year: Never true   Arboriculturist in the Last Year: Never true  Transportation Needs: No Transportation Needs   Lack of Transportation (Medical): No   Lack of Transportation (Non-Medical): No  Physical Activity: Insufficiently Active   Days of Exercise per Week: 3 days   Minutes of Exercise per Session: 30 min  Stress: No Stress Concern Present   Feeling of Stress : Not at all  Social Connections: Moderately Integrated  Frequency of Communication with Friends and Family: Three times a week   Frequency of Social Gatherings with Friends and Family: Three times a week   Attends Religious Services: More than 4 times per year   Active Member of Clubs or Organizations: No   Attends Archivist Meetings: Never   Marital Status: Married  Human resources officer Violence: Not At Risk   Fear of Current or Ex-Partner: No   Emotionally Abused: No   Physically Abused: No   Sexually Abused: No    Mr. Meskill family history includes Colon cancer in his father; Heart attack in his father; Heart disease in his brother and brother.      Objective:    Vitals:   11/14/20 0837  BP: 126/70  Pulse: 69  SpO2: 97%    Physical Exam Well-developed well-nourished older white male in no acute distress.  Accompanied by his wife Weight, 189 BMI 27.98  HEENT; nontraumatic normocephalic, EOMI, PE R LA, sclera anicteric. Oropharynx; not examined today Neck; supple, no JVD Cardiovascular; regular rate and rhythm with S1-S2, no murmur rub or gallop Pulmonary; Clear bilaterally Abdomen; soft, nontender, nondistended, no palpable mass or hepatosplenomegaly, bowel sounds are  active Rectal; small benign external hemorrhoidal tags, on anoscopy he has 2 small friable internal hemorrhoids with slight oozing of heme on exam Skin; benign exam, no jaundice rash or appreciable lesions Extremities; no clubbing cyanosis or edema skin warm and dry Neuro/Psych; alert and oriented x4, grossly nonfocal mood and affect appropriate        Assessment & Plan:   #29 71 year old white male with 2 recent episodes of hematochezia, in setting of Xarelto. 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 No response to trial of diltiazem Continues on twice daily PPI Aciphex Patient relates improvement after Botox injections and prior esophageal dilation and would like to have another endoscopy and dilation.  #5 history of DVT/PE-on Xarelto #6 trigeminal neuralgia 7.  Peripheral neuropathy 8.  Hypertension  Plan; patient will be scheduled for Colonoscopy, and EGD with probable empiric dilation with Steven [name]. Both procedures were discussed in detail with the patient including indications risk and benefits and he is agreeable to proceed. Xarelto will need to be held for 24 hours prior to procedure.  We will communicate with his PCP Dr. Sharlene Motts to assure this is reasonable for this patient Continue Aciphex 20 mg p.o. twice daily Discussed possible trial of antispasmodic however his chart indicates BPH so hesitant. Start Anusol HC suppositories nightly x7 days, then repeat course as needed  Pending on findings at EGD and  response to probable empiric dilation, could consider repeat Botox as he has had response to this in the past despite the manometry findings.  Plan  Chalet Kerwin Genia Harold PA-C 11/14/2020   Cc: Laurey Morale, MD

## 2020-11-17 ENCOUNTER — Encounter: Payer: PPO | Admitting: Family Medicine

## 2020-11-22 ENCOUNTER — Telehealth: Payer: Self-pay | Admitting: Family Medicine

## 2020-11-22 MED ORDER — MOLNUPIRAVIR EUA 200MG CAPSULE: 4.0000 | ORAL_CAPSULE | Freq: Two times a day (BID) | ORAL | 0 refills | Status: AC

## 2020-11-22 NOTE — Telephone Encounter (Signed)
Received call from after hours nurse about patient testing positive for COVID today.  1d h/o ST with Tmax 101 and body aches, cough. No dyspnea, wheezing, chest pain. O2 sat 97%.   I spoke with patient's wife. 2nd time they've had COVID.  Not vaccinated.  Reviewed currently approved EUA treatments.  Reviewed expected course of illness, anticipated course of recovery, as well as red flags to suggest COVID pneumonia and/or to seek urgent in-person care. Reviewed latest CDC isolation/quarantine guidelines.  Encouraged fluids and rest. Reviewed further supportive care measures at home including vit C 500mg  bid, vit D 2000 IU daily, zinc 100mg  daily, tylenol PRN, pepcid 20mg  BID PRN.   Recommend:  Molnupiravir given drug interactions and no recent GFR.  Paxlovid drug interactions:   Eletriptan, rivaroxaban  Lab Results  Component Value Date   CREATININE 1.48 (H) 02/01/2020   BUN 17 02/01/2020   NA 142 02/01/2020   K 4.8 02/01/2020   CL 104 02/01/2020   CO2 24 02/01/2020  GFR = 47

## 2020-11-24 ENCOUNTER — Telehealth: Payer: Self-pay

## 2020-11-24 NOTE — Telephone Encounter (Signed)
Patient requesting Rx refills rivaroxaban (XARELTO) 20 MG TABS tablet RABEprazole (ACIPHEX) 20 MG tablet losartan (COZAAR) 50 MG tablet ezetimibe (ZETIA) 10 MG tablet Diphenhyd-Hydrocort-Nystatin (FIRST-DUKES MOUTHWASH) SUSP

## 2020-11-25 ENCOUNTER — Other Ambulatory Visit: Payer: Self-pay

## 2020-11-25 MED ORDER — LOSARTAN POTASSIUM 50 MG PO TABS: 50.0000 mg | ORAL_TABLET | Freq: Every day | ORAL | 0 refills | Status: DC

## 2020-11-25 MED ORDER — EZETIMIBE 10 MG PO TABS: 10.0000 mg | ORAL_TABLET | Freq: Every day | ORAL | 0 refills | Status: DC

## 2020-11-26 NOTE — Telephone Encounter (Addendum)
Patient requesting refill for Diphenhyd-Hydrocort-Nystatin mouthwash.  Medication currently not on list.   Please advise

## 2020-11-26 NOTE — Telephone Encounter (Signed)
Wife called to clarify that the mouthwash was supposed to be sent to  [facility], [place] Phone:  [phone]  Fax:  [phone]    Pharmacy is currently stating they do not have it in.  The rest of the prescriptions rivaroxaban (XARELTO) 20 MG TABS tablet,   RABEprazole (ACIPHEX) 20 MG tablet,   losartan (COZAAR) 50 MG tablet,   ezetimibe (ZETIA) 10 MG tablet  should be sent to the mail order at  [facility]) - [place], [place] Phone:  [phone]  Fax:  [phone]         Please advise

## 2020-11-26 NOTE — Telephone Encounter (Signed)
Please refill all these meds

## 2020-11-27 ENCOUNTER — Other Ambulatory Visit: Payer: Self-pay

## 2020-11-27 MED ORDER — RIVAROXABAN 20 MG PO TABS: 20.0000 mg | ORAL_TABLET | Freq: Every day | ORAL | 1 refills | Status: DC

## 2020-11-27 MED ORDER — FENOFIBRATE 145 MG PO TABS: 145.0000 mg | ORAL_TABLET | Freq: Every evening | ORAL | 0 refills | Status: DC

## 2020-11-27 MED ORDER — NYSTATIN NICU ORAL SYRINGE 100,000 UNITS/ML: 5.0000 mL | Freq: Four times a day (QID) | OROMUCOSAL | 5 refills | Status: DC

## 2020-11-27 MED ORDER — NYSTATIN NICU ORAL SYRINGE 100,000 UNITS/ML
5.0000 mL | Freq: Four times a day (QID) | OROMUCOSAL | 5 refills | Status: DC
Start: 2020-11-27 — End: 2020-12-18

## 2020-11-27 NOTE — Telephone Encounter (Signed)
Pt wife is calling and pt needs 300 duke magic mouthwash not first dukes mouthwash. Pt send to [facility]

## 2020-11-27 NOTE — Telephone Encounter (Signed)
Please call his pharmacy to straighten out the mouthwash issue

## 2020-11-27 NOTE — Telephone Encounter (Signed)
Pt Rx was faxed  to [facility] as requested

## 2020-11-27 NOTE — Telephone Encounter (Addendum)
Diphenhyd-Hydrocort-Nystatin mouthwash unable to send in. All other medications have been sent to requested pharmacy.  Please advise of alternative.

## 2020-11-28 ENCOUNTER — Other Ambulatory Visit: Payer: Self-pay

## 2020-11-28 ENCOUNTER — Encounter: Payer: PPO | Admitting: Family Medicine

## 2020-11-28 NOTE — Telephone Encounter (Signed)
Spoke with patient wife, she stated that patient says that the mouth wash prescription that was sent to the pharmacy  does not work.   Patient is requesting for Dukes magic mouthwash.     Please advise

## 2020-12-02 ENCOUNTER — Other Ambulatory Visit: Payer: Self-pay | Admitting: Family Medicine

## 2020-12-08 NOTE — Telephone Encounter (Signed)
Can you please call in whatever form of mouthwash that he is asking for?

## 2020-12-11 ENCOUNTER — Telehealth: Payer: Self-pay | Admitting: Gastroenterology

## 2020-12-11 ENCOUNTER — Telehealth: Payer: Self-pay

## 2020-12-11 NOTE — Telephone Encounter (Signed)
Patient has been notified

## 2020-12-11 NOTE — Telephone Encounter (Signed)
The pt was seen by Nicoletta Ba in the office will send to her CMA

## 2020-12-11 NOTE — Telephone Encounter (Signed)
Patient has been advised of when to stop Xarelto

## 2020-12-11 NOTE — Telephone Encounter (Signed)
Yes he can safely come off the Xarelto 2 days prior to the procedures and then resume taking it the day after

## 2020-12-11 NOTE — Telephone Encounter (Signed)
   Steven [name] May 31, 1950 [id number]  Dear Dr. Sarajane Jews:  We have scheduled the above named patient for a(n) Endoscopy/Colonoscopy procedure. Our records show that (s)he is on anticoagulation therapy.  Please advise as to whether the patient may come off their therapy of Xarelto 2 days prior to their procedure which is scheduled for 02/10/2021.  Please route your response to Cherylann Parr, CMA or fax response to [phone].  Sincerely,    [facility]

## 2020-12-11 NOTE — Telephone Encounter (Signed)
Patients wife called and rescheduled patients colonoscopy for 1/31. She is wanting to know when he needs to stop taking his blood thinning medication. Please advise.

## 2020-12-12 ENCOUNTER — Other Ambulatory Visit: Payer: Self-pay

## 2020-12-12 MED ORDER — NYSTATIN 100000 UNIT/ML MT SUSP: 5.0000 mL | Freq: Three times a day (TID) | OROMUCOSAL | 0 refills | Status: DC | PRN

## 2020-12-12 NOTE — Telephone Encounter (Signed)
Rx sent to pt pharmacy 

## 2020-12-18 ENCOUNTER — Ambulatory Visit (INDEPENDENT_AMBULATORY_CARE_PROVIDER_SITE_OTHER): Payer: PPO | Admitting: Family Medicine

## 2020-12-18 ENCOUNTER — Encounter: Payer: Self-pay | Admitting: Family Medicine

## 2020-12-18 VITALS — BP 120/80 | HR 62 | Temp 98.5°F | Ht 69.0 in | Wt 190.0 lb

## 2020-12-18 DIAGNOSIS — E538 Deficiency of other specified B group vitamins: Secondary | ICD-10-CM | POA: Diagnosis not present

## 2020-12-18 DIAGNOSIS — Z0000 Encounter for general adult medical examination without abnormal findings: Secondary | ICD-10-CM | POA: Diagnosis not present

## 2020-12-18 DIAGNOSIS — Z23 Encounter for immunization: Secondary | ICD-10-CM | POA: Diagnosis not present

## 2020-12-18 DIAGNOSIS — I82409 Acute embolism and thrombosis of unspecified deep veins of unspecified lower extremity: Secondary | ICD-10-CM

## 2020-12-18 LAB — CBC WITH DIFFERENTIAL/PLATELET
Basophils Absolute: 0 10*3/uL (ref 0.0–0.1)
Basophils Relative: 0.8 % (ref 0.0–3.0)
Eosinophils Absolute: 0.2 10*3/uL (ref 0.0–0.7)
Eosinophils Relative: 3.8 % (ref 0.0–5.0)
HCT: 42.4 % (ref 39.0–52.0)
Hemoglobin: 14.4 g/dL (ref 13.0–17.0)
Lymphocytes Relative: 26 % (ref 12.0–46.0)
Lymphs Abs: 1.4 10*3/uL (ref 0.7–4.0)
MCHC: 33.9 g/dL (ref 30.0–36.0)
MCV: 86.8 fl (ref 78.0–100.0)
Monocytes Absolute: 0.5 10*3/uL (ref 0.1–1.0)
Monocytes Relative: 9.5 % (ref 3.0–12.0)
Neutro Abs: 3.1 10*3/uL (ref 1.4–7.7)
Neutrophils Relative %: 59.9 % (ref 43.0–77.0)
Platelets: 220 10*3/uL (ref 150.0–400.0)
RBC: 4.89 Mil/uL (ref 4.22–5.81)
RDW: 13.7 % (ref 11.5–15.5)
WBC: 5.2 10*3/uL (ref 4.0–10.5)

## 2020-12-18 LAB — LIPID PANEL
Cholesterol: 176 mg/dL (ref 0–200)
HDL: 43.9 mg/dL (ref >39.00)
LDL Cholesterol: 103 mg/dL — ABNORMAL HIGH (ref 0–99)
NonHDL: 131.61
Total CHOL/HDL Ratio: 4
Triglycerides: 142 mg/dL (ref 0.0–149.0)
VLDL: 28.4 mg/dL (ref 0.0–40.0)

## 2020-12-18 LAB — HEPATIC FUNCTION PANEL
ALT: 32 U/L (ref 0–53)
AST: 26 U/L (ref 0–37)
Albumin: 4.5 g/dL (ref 3.5–5.2)
Alkaline Phosphatase: 60 U/L (ref 39–117)
Bilirubin, Direct: 0.1 mg/dL (ref 0.0–0.3)
Total Bilirubin: 0.7 mg/dL (ref 0.2–1.2)
Total Protein: 7 g/dL (ref 6.0–8.3)

## 2020-12-18 LAB — BASIC METABOLIC PANEL
BUN: 18 mg/dL (ref 6–23)
CO2: 29 mEq/L (ref 19–32)
Calcium: 9.6 mg/dL (ref 8.4–10.5)
Chloride: 104 mEq/L (ref 96–112)
Creatinine, Ser: 1.45 mg/dL (ref 0.40–1.50)
GFR: 48.27 mL/min — ABNORMAL LOW (ref >60.00)
Glucose, Bld: 90 mg/dL (ref 70–99)
Potassium: 4.3 mEq/L (ref 3.5–5.1)
Sodium: 140 mEq/L (ref 135–145)

## 2020-12-18 LAB — VITAMIN B12: Vitamin B-12: 287 pg/mL (ref 211–911)

## 2020-12-18 LAB — HEMOGLOBIN A1C: Hgb A1c MFr Bld: 5.7 % (ref 4.6–6.5)

## 2020-12-18 LAB — PSA: PSA: 1.41 ng/mL (ref 0.10–4.00)

## 2020-12-18 LAB — TSH: TSH: 3.71 u[IU]/mL (ref 0.35–5.50)

## 2020-12-18 NOTE — Progress Notes (Signed)
   Subjective:    Patient ID: Steven [name], male    DOB: 07/18/1950, 70 y.o.   MRN: [id number]  HPI Here for a well exam. He feels well.    Review of Systems  Constitutional: Negative.   HENT: Negative.    Eyes: Negative.   Respiratory: Negative.    Cardiovascular: Negative.   Gastrointestinal: Negative.   Genitourinary: Negative.   Musculoskeletal: Negative.   Skin: Negative.   Neurological: Negative.   Psychiatric/Behavioral: Negative.        Objective:   Physical Exam Constitutional:      General: He is not in acute distress.    Appearance: Normal appearance. He is well-developed. He is not diaphoretic.  HENT:     Head: Normocephalic and atraumatic.     Right Ear: External ear normal.     Left Ear: External ear normal.     Nose: Nose normal.     Mouth/Throat:     Pharynx: No oropharyngeal exudate.  Eyes:     General: No scleral icterus.       Right eye: No discharge.        Left eye: No discharge.     Conjunctiva/sclera: Conjunctivae normal.     Pupils: Pupils are equal, round, and reactive to light.  Neck:     Thyroid: No thyromegaly.     Vascular: No JVD.     Trachea: No tracheal deviation.  Cardiovascular:     Rate and Rhythm: Normal rate and regular rhythm.     Heart sounds: Normal heart sounds. No murmur heard.   No friction rub. No gallop.  Pulmonary:     Effort: Pulmonary effort is normal. No respiratory distress.     Breath sounds: Normal breath sounds. No wheezing or rales.  Chest:     Chest wall: No tenderness.  Abdominal:     General: Bowel sounds are normal. There is no distension.     Palpations: Abdomen is soft. There is no mass.     Tenderness: There is no abdominal tenderness. There is no guarding or rebound.  Genitourinary:    Penis: Normal. No tenderness.      Testes: Normal.     Prostate: Normal.     Rectum: Normal. Guaiac result negative.  Musculoskeletal:        General: No tenderness. Normal range of motion.     Cervical back:  Neck supple.  Lymphadenopathy:     Cervical: No cervical adenopathy.  Skin:    General: Skin is warm and dry.     Coloration: Skin is not pale.     Findings: No erythema or rash.  Neurological:     Mental Status: He is alert and oriented to person, place, and time.     Cranial Nerves: No cranial nerve deficit.     Motor: No abnormal muscle tone.     Coordination: Coordination normal.     Deep Tendon Reflexes: Reflexes are normal and symmetric. Reflexes normal.  Psychiatric:        Behavior: Behavior normal.        Thought Content: Thought content normal.        Judgment: Judgment normal.          Assessment & Plan:  Well exam. We discussed diet and exercise. Get fasting labs. He is scheduled for upper and lower endoscopy per Dr.Jacobs on 02-09-21.  Alysia Penna, MD

## 2020-12-18 NOTE — Addendum Note (Signed)
Addended by: Wyvonne Lenz on: 12/18/2020 09:14 AM   Modules accepted: Orders

## 2020-12-19 ENCOUNTER — Ambulatory Visit: Payer: PPO | Admitting: Gastroenterology

## 2020-12-19 LAB — D-DIMER, QUANTITATIVE: D-Dimer, Quant: <0.19 mcg/mL FEU (ref <0.50)

## 2021-01-02 ENCOUNTER — Encounter: Payer: PPO | Admitting: Gastroenterology

## 2021-01-08 ENCOUNTER — Telehealth: Payer: PPO

## 2021-01-14 DIAGNOSIS — M7541 Impingement syndrome of right shoulder: Secondary | ICD-10-CM | POA: Diagnosis not present

## 2021-01-14 DIAGNOSIS — M19041 Primary osteoarthritis, right hand: Secondary | ICD-10-CM | POA: Diagnosis not present

## 2021-01-14 DIAGNOSIS — M79644 Pain in right finger(s): Secondary | ICD-10-CM | POA: Diagnosis not present

## 2021-01-14 DIAGNOSIS — M79642 Pain in left hand: Secondary | ICD-10-CM | POA: Diagnosis not present

## 2021-01-14 DIAGNOSIS — M79641 Pain in right hand: Secondary | ICD-10-CM | POA: Diagnosis not present

## 2021-01-16 ENCOUNTER — Other Ambulatory Visit: Payer: Self-pay

## 2021-01-16 ENCOUNTER — Encounter (HOSPITAL_BASED_OUTPATIENT_CLINIC_OR_DEPARTMENT_OTHER): Payer: Self-pay | Admitting: Obstetrics and Gynecology

## 2021-01-16 DIAGNOSIS — Z7901 Long term (current) use of anticoagulants: Secondary | ICD-10-CM | POA: Diagnosis not present

## 2021-01-16 DIAGNOSIS — R001 Bradycardia, unspecified: Secondary | ICD-10-CM | POA: Diagnosis not present

## 2021-01-16 DIAGNOSIS — I10 Essential (primary) hypertension: Secondary | ICD-10-CM | POA: Insufficient documentation

## 2021-01-16 DIAGNOSIS — Z79899 Other long term (current) drug therapy: Secondary | ICD-10-CM | POA: Insufficient documentation

## 2021-01-16 DIAGNOSIS — R066 Hiccough: Secondary | ICD-10-CM | POA: Insufficient documentation

## 2021-01-16 LAB — CBC
HCT: 39.3 % (ref 39.0–52.0)
Hemoglobin: 13.4 g/dL (ref 13.0–17.0)
MCH: 28.9 pg (ref 26.0–34.0)
MCHC: 34.1 g/dL (ref 30.0–36.0)
MCV: 84.7 fL (ref 80.0–100.0)
Platelets: 266 10*3/uL (ref 150–400)
RBC: 4.64 MIL/uL (ref 4.22–5.81)
RDW: 13.6 % (ref 11.5–15.5)
WBC: 9.4 10*3/uL (ref 4.0–10.5)
nRBC: 0 % (ref 0.0–0.2)

## 2021-01-16 LAB — BASIC METABOLIC PANEL
Anion gap: 8 (ref 5–15)
BUN: 27 mg/dL — ABNORMAL HIGH (ref 8–23)
CO2: 27 mmol/L (ref 22–32)
Calcium: 9 mg/dL (ref 8.9–10.3)
Chloride: 104 mmol/L (ref 98–111)
Creatinine, Ser: 1.64 mg/dL — ABNORMAL HIGH (ref 0.61–1.24)
GFR, Estimated: 44 mL/min — ABNORMAL LOW (ref >60)
Glucose, Bld: 111 mg/dL — ABNORMAL HIGH (ref 70–99)
Potassium: 3.9 mmol/L (ref 3.5–5.1)
Sodium: 139 mmol/L (ref 135–145)

## 2021-01-16 LAB — TROPONIN I (HIGH SENSITIVITY): Troponin I (High Sensitivity): 2 ng/L (ref <18)

## 2021-01-16 NOTE — ED Triage Notes (Signed)
Patient reports to the ER for hiccups x2 days. Patient reports he was concerned about it being heart trouble.

## 2021-01-17 ENCOUNTER — Emergency Department (HOSPITAL_BASED_OUTPATIENT_CLINIC_OR_DEPARTMENT_OTHER)
Admission: EM | Admit: 2021-01-17 | Discharge: 2021-01-17 | Disposition: A | Discharge status: Home / Self Care | Payer: PPO | Attending: Emergency Medicine | Admitting: Emergency Medicine

## 2021-01-17 ENCOUNTER — Emergency Department (HOSPITAL_BASED_OUTPATIENT_CLINIC_OR_DEPARTMENT_OTHER): Payer: PPO

## 2021-01-17 DIAGNOSIS — R066 Hiccough: Secondary | ICD-10-CM | POA: Diagnosis not present

## 2021-01-17 MED ORDER — ALUM & MAG HYDROXIDE-SIMETH 200-200-20 MG/5ML PO SUSP
30.0000 mL | Freq: Once | ORAL | Status: AC
Start: 2021-01-17 — End: 2021-01-17
  Administered 2021-01-17: 30 mL via ORAL
  Filled 2021-01-17: qty 30

## 2021-01-17 MED ORDER — DIAZEPAM 5 MG/ML IJ SOLN
2.5000 mg | Freq: Once | INTRAMUSCULAR | Status: AC
  Administered 2021-01-17: 2.5 mg via INTRAVENOUS
  Filled 2021-01-17: qty 2

## 2021-01-17 MED ORDER — METOCLOPRAMIDE HCL 10 MG PO TABS: 10.0000 mg | ORAL_TABLET | Freq: Three times a day (TID) | ORAL | 0 refills | Status: DC | PRN

## 2021-01-17 MED ORDER — LIDOCAINE VISCOUS HCL 2 % MT SOLN
15.0000 mL | Freq: Once | OROMUCOSAL | Status: AC
  Administered 2021-01-17: 15 mL via ORAL
  Filled 2021-01-17: qty 15

## 2021-01-17 MED ORDER — METOCLOPRAMIDE HCL 5 MG/ML IJ SOLN
10.0000 mg | Freq: Once | INTRAMUSCULAR | Status: AC
  Administered 2021-01-17: 10 mg via INTRAVENOUS
  Filled 2021-01-17: qty 2

## 2021-01-17 NOTE — ED Provider Notes (Signed)
[facility] EMERGENCY DEPT Provider Note  CSN: [id number] Arrival date & time: 01/16/21 1908  Chief Complaint(s) Hiccups  HPI Steven [name] is a 71 y.o. male with a past medical history listed below including hiatal hernia, acid reflux here for 2 to 3 days of intermittent but frequent hiccuping.  This is exacerbated with oral intake.  No associated chest pain or shortness of breath.  No recent fevers or infections.  No coughing or congestion.  Patient now is endorsing soreness from all the hiccuping but denies any overt abdominal pain.   Patient spoke with his doctor who recommended he present to the emergency department to rule out cardiac etiology.  The history is provided by the patient.   Past Medical History Past Medical History:  Diagnosis Date   Clotting disorder ([place])    Displacement of intervertebral disc, site unspecified, without myelopathy    Esophageal reflux    Headache(784.0)    sinus and migraines   Herpes simplex without mention of complication    Hiatal hernia    HTN (hypertension) 05/10/2012   Hyperlipemia    Hypertrophy (benign) of prostate    Intestinal infection due to other organism, not elsewhere classified    Kidney stones    sees Dr. 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   Herpes zoster without complication 53/29/9242   Diverticulitis 03/29/2019   Esophageal dysphagia    Trigeminal neuralgia of right side of face 09/05/2017   DVT (deep venous thrombosis) ([place]) 07/22/2016   Renal insufficiency    Pulmonary emboli ([place]) 07/19/2016   Atypical chest pain 02/07/2013   HTN (hypertension) 05/10/2012   PERSONAL HX COLONIC POLYPS 01/30/2010   PULMONARY NODULE 68/34/1962   BILIARY COLIC 22/97/9892   Hereditary and  idiopathic peripheral neuropathy 07/31/2008   KNEE PAIN 06/21/2007   Personal history of venous thrombosis and embolism 03/06/2007   Acute thromboembolism of deep veins of lower extremity ([place]) 03/01/2007   HERNIATED [place] 10/31/2006   FEVER BLISTER 10/24/2006   Hyperlipidemia 10/24/2006   GERD 10/24/2006   Migraine headache 10/24/2006   HIATAL HERNIA, HX OF 10/24/2006   Personal history of urinary calculi 10/24/2006   BENIGN PROSTATIC HYPERTROPHY, HX OF 10/24/2006   Home Medication(s) Prior to Admission medications   Medication Sig Start Date End Date Taking? Authorizing Provider  metoCLOPramide (REGLAN) 10 MG tablet Take 1 tablet (10 mg total) by mouth every 8 (eight) hours as needed (hiccups). 01/17/21  Yes Oswin Johal, Grayce Sessions, MD  acyclovir cream (ZOVIRAX) 5 % Apply 1 application topically daily as needed (fever blisters). 09/06/13   Laurey Morale, MD  eletriptan (RELPAX) 20 MG tablet Take 1 tablet (20 mg total) by mouth as needed for migraine. 06/24/14   Laurey Morale, MD  ezetimibe (ZETIA) 10 MG tablet Take 1 tablet (10 mg total) by mouth daily. 11/25/20   Laurey Morale, MD  fenofibrate (TRICOR) 145 MG tablet Take 1 tablet (145 mg total) by mouth every evening. 11/27/20   Laurey Morale, MD  folic acid (FOLVITE) 1 MG tablet Take 1 mg by mouth daily.    [provider]  hydrocortisone-pramoxine (PROCTOFOAM HC) rectal foam Place 1 applicator rectally 2 (two) times daily. 12/21/17   Laurey Morale, MD  losartan (  COZAAR) 50 MG tablet Take 1 tablet (50 mg total) by mouth daily. 11/25/20   Laurey Morale, MD  magic mouthwash (nystatin, hydrocortisone, diphenhydrAMINE) suspension Swish and spit 5 mLs 3 (three) times daily as needed for mouth pain. 12/12/20   Laurey Morale, MD  Na Sulfate-K Sulfate-Mg Sulf 17.5-3.13-1.6 GM/177ML SOLN Swish and spit 5 ml in the mouth or throat every 6 hours as needed for irritation 12/02/20   Laurey Morale, MD  RABEprazole (ACIPHEX) 20 MG tablet  Take 1 tablet (20 mg total) by mouth 2 (two) times daily. 06/11/20   Laurey Morale, MD  rivaroxaban (XARELTO) 20 MG TABS tablet Take 1 tablet (20 mg total) by mouth daily with supper. 11/27/20   Laurey Morale, MD  temazepam (RESTORIL) 30 MG capsule Take 1 capsule (30 mg total) by mouth at bedtime as needed for sleep. 11/14/19   Laurey Morale, MD                                                                                                                                    Allergies Morphine, Amitriptyline hcl, Amoxicillin-pot clavulanate, Cyclobenzaprine hcl, Levofloxacin, and Lisinopril-hydrochlorothiazide  Review of Systems Review of Systems As noted in HPI  Physical Exam Vital Signs  I have reviewed the triage vital signs BP (!) 134/91    Pulse (!) 54    Temp 98.4 F (36.9 C) (Oral)    Resp 20    Ht 5\' 9"  (1.753 m)    Wt 83.5 kg    SpO2 98%    BMI 27.17 kg/m   Physical Exam Vitals reviewed.  Constitutional:      General: He is not in acute distress.    Appearance: He is well-developed. He is not diaphoretic.  HENT:     Head: Normocephalic and atraumatic.     Nose: Nose normal.  Eyes:     General: No scleral icterus.       Right eye: No discharge.        Left eye: No discharge.     Conjunctiva/sclera: Conjunctivae normal.     Pupils: Pupils are equal, round, and reactive to light.  Cardiovascular:     Rate and Rhythm: Normal rate and regular rhythm.     Heart sounds: No murmur heard.   No friction rub. No gallop.  Pulmonary:     Effort: Pulmonary effort is normal. No respiratory distress.     Breath sounds: Normal breath sounds. No stridor. No rales.  Abdominal:     General: There is no distension.     Palpations: Abdomen is soft.     Tenderness: There is no abdominal tenderness.  Musculoskeletal:        General: No tenderness.     Cervical back: Normal range of motion and neck supple.  Skin:    General: Skin is warm and dry.     Findings: No erythema  or rash.   Neurological:     Mental Status: He is alert and oriented to person, place, and time.    ED Results and Treatments Labs (all labs ordered are listed, but only abnormal results are displayed) Labs Reviewed  BASIC METABOLIC PANEL - Abnormal; Notable for the following components:      Result Value   Glucose, Bld 111 (*)    BUN 27 (*)    Creatinine, Ser 1.64 (*)    GFR, Estimated 44 (*)    All other components within normal limits  CBC  TROPONIN I (HIGH SENSITIVITY)  TROPONIN I (HIGH SENSITIVITY)                                                                                                                         EKG  EKG Interpretation  Date/Time:  Friday January 16 2021 20:25:09 EST Ventricular Rate:  58 PR Interval:  192 QRS Duration: 98 QT Interval:  426 QTC Calculation: 418 R Axis:   -21 Text Interpretation: Sinus bradycardia Minimal voltage criteria for LVH, may be normal variant ( R in aVL ) Borderline ECG When compared with ECG of 19-Jul-2016 15:44, No significant change was found Confirmed by Addison Lank [phone]) on 01/17/2021 3:22:28 AM       Radiology DG Chest Port 1 View  Result Date: 01/17/2021 CLINICAL DATA:  Hiccups for 2 days EXAM: PORTABLE CHEST 1 VIEW COMPARISON:  None. FINDINGS: The heart size and mediastinal contours are within normal limits. Both lungs are clear. The visualized skeletal structures are unremarkable. IMPRESSION: No active disease. Electronically Signed   By: Ulyses Jarred M.D.   On: 01/17/2021 02:54    Pertinent labs & imaging results that were available during my care of the patient were reviewed by me and considered in my medical decision making (see MDM for details).  Medications Ordered in ED Medications  alum & mag hydroxide-simeth (MAALOX/MYLANTA) 200-200-20 MG/5ML suspension 30 mL (30 mLs Oral Given 01/17/21 0332)    And  lidocaine (XYLOCAINE) 2 % viscous mouth solution 15 mL (15 mLs Oral Given 01/17/21 0332)  diazepam (VALIUM)  injection 2.5 mg (2.5 mg Intravenous Given 01/17/21 0441)  metoCLOPramide (REGLAN) injection 10 mg (10 mg Intravenous Given 01/17/21 0607)  Procedures Procedures  (including critical care time)  Medical Decision Making / ED Course     Patient here with recurrent hiccups. No other associated symptoms.  Sent here to rule out cardiac process.  Work-up independently interpreted by myself and noted below: EKG without acute ischemic changes. Troponin negative. CBC without leukocytosis or anemia No significant electrolyte derangements Patient does have mild AKI but close to his baseline.  Given the duration of his symptoms do not feel that additional cardiac markers are necessary.  Patient provided with GI cocktail and Valium which provided minimal relief. Reported more significant relief with Reglan.    Final Clinical Impression(s) / ED Diagnoses Final diagnoses:  Hiccups   The patient appears reasonably screened and/or stabilized for discharge and I doubt any other medical condition or other [facility] requiring further screening, evaluation, or treatment in the ED at this time prior to discharge. Safe for discharge with strict return precautions.  Disposition: Discharge  Condition: Good  I have discussed the results, Dx and Tx plan with the patient/family who expressed understanding and agree(s) with the plan. Discharge instructions discussed at length. The patient/family was given strict return precautions who verbalized understanding of the instructions. No further questions at time of discharge.    ED Discharge Orders          Ordered    metoCLOPramide (REGLAN) 10 MG tablet  Every 8 hours PRN        01/17/21 7564              Follow Up: Laurey Morale, MD [place] [address] [phone]  Call  for help  establishing care with a care provider           This chart was dictated using voice recognition software.  Despite best efforts to proofread,  errors can occur which can change the documentation meaning.    Fatima Blank, MD 01/17/21 [phone]

## 2021-01-26 ENCOUNTER — Telehealth: Payer: Self-pay | Admitting: Physician Assistant

## 2021-01-26 NOTE — Telephone Encounter (Signed)
Patients wife called to request new prep instructions to reflect new date\time.

## 2021-01-26 NOTE — Telephone Encounter (Signed)
Spoke with patient's wife and advised I would send directions through [facility]. She wants directions mailed instead. New instructions have been typed up and will be mailed out.

## 2021-01-28 ENCOUNTER — Telehealth: Payer: Self-pay | Admitting: Family Medicine

## 2021-01-28 DIAGNOSIS — I10 Essential (primary) hypertension: Secondary | ICD-10-CM

## 2021-01-28 DIAGNOSIS — E785 Hyperlipidemia, unspecified: Secondary | ICD-10-CM

## 2021-01-28 MED ORDER — LOSARTAN POTASSIUM 50 MG PO TABS: 50.0000 mg | ORAL_TABLET | Freq: Every day | ORAL | 1 refills | Status: DC

## 2021-01-28 MED ORDER — EZETIMIBE 10 MG PO TABS: 10.0000 mg | ORAL_TABLET | Freq: Every day | ORAL | 1 refills | Status: DC

## 2021-01-28 NOTE — Telephone Encounter (Signed)
Pt wife is calling and pt needs refills on ezetimibe (ZETIA) 10 MG tablet and losartan (COZAAR) 50 MG tablet 90 days supply w/refills [facility] ([place], [place] Phone:  [phone]  Fax:  [phone]

## 2021-01-28 NOTE — Telephone Encounter (Signed)
Refills has been sent to [facility].

## 2021-02-03 ENCOUNTER — Telehealth: Payer: PPO

## 2021-02-10 ENCOUNTER — Ambulatory Visit (AMBULATORY_SURGERY_CENTER): Payer: PPO | Admitting: Gastroenterology

## 2021-02-10 ENCOUNTER — Encounter: Payer: Self-pay | Admitting: Gastroenterology

## 2021-02-10 VITALS — BP 108/59 | HR 55 | Temp 98.4°F | Resp 18 | Ht 69.0 in | Wt 189.0 lb

## 2021-02-10 DIAGNOSIS — K222 Esophageal obstruction: Secondary | ICD-10-CM | POA: Diagnosis not present

## 2021-02-10 DIAGNOSIS — D120 Benign neoplasm of cecum: Secondary | ICD-10-CM | POA: Diagnosis not present

## 2021-02-10 DIAGNOSIS — K219 Gastro-esophageal reflux disease without esophagitis: Secondary | ICD-10-CM | POA: Diagnosis not present

## 2021-02-10 DIAGNOSIS — Z86010 Personal history of colonic polyps: Secondary | ICD-10-CM

## 2021-02-10 DIAGNOSIS — Z800 Family history of malignant neoplasm of digestive organs: Secondary | ICD-10-CM | POA: Diagnosis not present

## 2021-02-10 DIAGNOSIS — K5731 Diverticulosis of large intestine without perforation or abscess with bleeding: Secondary | ICD-10-CM | POA: Diagnosis not present

## 2021-02-10 DIAGNOSIS — I10 Essential (primary) hypertension: Secondary | ICD-10-CM | POA: Diagnosis not present

## 2021-02-10 DIAGNOSIS — R1310 Dysphagia, unspecified: Secondary | ICD-10-CM

## 2021-02-10 DIAGNOSIS — K449 Diaphragmatic hernia without obstruction or gangrene: Secondary | ICD-10-CM | POA: Diagnosis not present

## 2021-02-10 DIAGNOSIS — D123 Benign neoplasm of transverse colon: Secondary | ICD-10-CM

## 2021-02-10 HISTORY — PX: ESOPHAGOGASTRODUODENOSCOPY: SHX1529

## 2021-02-10 HISTORY — PX: COLONOSCOPY: SHX174

## 2021-02-10 MED ORDER — SODIUM CHLORIDE 0.9 % IV SOLN: 500.0000 mL | Freq: Once | INTRAVENOUS | Status: DC

## 2021-02-10 NOTE — Patient Instructions (Signed)
YOU HAD AN ENDOSCOPIC PROCEDURE TODAY AT [facility]:   Refer to the procedure report that was given to you for any specific questions about what was found during the examination.  If the procedure report does not answer your questions, please call your gastroenterologist to clarify.  If you requested that your care partner not be given the details of your procedure findings, then the procedure report has been included in a sealed envelope for you to review at your convenience later.  **Handouts given on polyps, diverticulosis and hemorrhoids**  YOU SHOULD EXPECT: Some feelings of bloating in the abdomen. Passage of more gas than usual.  Walking can help get rid of the air that was put into your GI tract during the procedure and reduce the bloating. If you had a lower endoscopy (such as a colonoscopy or flexible sigmoidoscopy) you may notice spotting of blood in your stool or on the toilet paper. If you underwent a bowel prep for your procedure, you may not have a normal bowel movement for a few days.  Please Note:  You might notice some irritation and congestion in your nose or some drainage.  This is from the oxygen used during your procedure.  There is no need for concern and it should clear up in a day or so.  SYMPTOMS TO REPORT IMMEDIATELY:  Following lower endoscopy (colonoscopy or flexible sigmoidoscopy):  Excessive amounts of blood in the stool  Significant tenderness or worsening of abdominal pains  Swelling of the abdomen that is new, acute  Fever of 100F or higher  Following upper endoscopy (EGD)  Vomiting of blood or coffee ground material  New chest pain or pain under the shoulder blades  Painful or persistently difficult swallowing  New shortness of breath  Fever of 100F or higher  Black, tarry-looking stools  For urgent or emergent issues, a gastroenterologist can be reached at any hour by calling [phone]. Do not use MyChart messaging for urgent  concerns.    DIET:  We do recommend a small meal at first, but then you may proceed to your regular diet.  Drink plenty of fluids but you should avoid alcoholic beverages for 24 hours.  ACTIVITY:  You should plan to take it easy for the rest of today and you should NOT DRIVE or use heavy machinery until tomorrow (because of the sedation medicines used during the test).    FOLLOW UP: Our staff will call the number listed on your records 48-72 hours following your procedure to check on you and address any questions or concerns that you may have regarding the information given to you following your procedure. If we do not reach you, we will leave a message.  We will attempt to reach you two times.  During this call, we will ask if you have developed any symptoms of COVID 19. If you develop any symptoms (ie: fever, flu-like symptoms, shortness of breath, cough etc.) before then, please call [phone].  If you test positive for Covid 19 in the 2 weeks post procedure, please call and report this information to [name].    If any biopsies were taken you will be contacted by phone or by letter within the next 1-3 weeks.  Please call us at [phone] if you have not heard about the biopsies in 3 weeks.    SIGNATURES/CONFIDENTIALITY: You and/or your care partner have signed paperwork which will be entered into your electronic medical record.  These signatures attest to the fact  that that the information above on your After Visit Summary has been reviewed and is understood.  Full responsibility of the confidentiality of this discharge information lies with you and/or your care-partner.

## 2021-02-10 NOTE — Progress Notes (Signed)
VS-DT 

## 2021-02-10 NOTE — Op Note (Signed)
[facility] Patient Name: Steven [name] Procedure Date: 02/10/2021 9:43 AM MRN: [id number] Endoscopist: Milus Banister , MD Age: 71 Referring MD:  Date of Birth: 1950-10-01 Gender: Male Account #: [id number] Procedure:                Upper GI endoscopy, balloon dilation Indications:              Dysphagia; ? Esophageal dysmotility,                            achalasia?.Botox injection by previous                            gastroenterologist [facility]. EGD February 2012                            suggested a snug E. junction, esophageal manometry                            March, 2012 showed normal peristalsis but high                            residual lower esophageal sphincter pressure.                            Previoulsy was having dysphagia (around 2000 or                            so). Botox injection by [facility] helped, but caused                            worse reflux problems. 04/2012 EGD Ardis Hughs with botox                            injection again helped his symptoms. High Res                            esophageal manometry 02/2013 was essentially normal;                            no sign of achalasia. EGD 10/2018 small HH,                            otherwise normal examination.10/2018 Repeat esoph                            manometry: normal GE junction, + some findings                            suggestive of hypercontractile esophagus. Diltiazem                            did not help his symptoms. Medicines:                Monitored Anesthesia Care Procedure:  Pre-Anesthesia Assessment:                           - Prior to the procedure, a History and Physical                            was performed, and patient medications and                            allergies were reviewed. The patient's tolerance of                            previous anesthesia was also reviewed. The risks                            and benefits of the procedure and the  sedation                            options and risks were discussed with the patient.                            All questions were answered, and informed consent                            was obtained. Prior Anticoagulants: The patient has                            taken Xarelto (rivaroxaban), last dose was 2 days                            prior to procedure. After reviewing the risks and                            benefits, the patient was deemed in satisfactory                            condition to undergo the procedure.                           After obtaining informed consent, the endoscope was                            passed under direct vision. Throughout the                            procedure, the patient's blood pressure, pulse, and                            oxygen saturations were monitored continuously. The                            GIF [id number] was introduced through the  mouth, and advanced to the second part of duodenum.                            The upper GI endoscopy was accomplished without                            difficulty. The patient tolerated the procedure                            well. Scope In: Scope Out: Findings:                 A medium-sized hiatal hernia was present with                            typical esophageal foreshortening and tortuosity.                            There was a very minor mucosal ring at the GE                            junction (Schatzki's ring) which I dilated using a                            CRE TTS balloon held inflated for about 1 minute.                            There was no mucosal disruption or bleeding                            following dilation.                           A few small classic appearing fundic gland                            throughout the stomach.                           The exam was otherwise without abnormality. Complications:            No  immediate complications. Estimated blood loss:                            None. Estimated Blood Loss:     Estimated blood loss: none. Impression:               - A medium-sized hiatal hernia was present with                            typical esophageal foreshortening and tortuosity.                            There was a very minor mucosal ring at the GE  junction (Schatzki's ring) which I dilated using a                            CRE TTS balloon held inflated for about 1 minute.                            There was no mucosal disruption or bleeding                            following dilation.                           - A few small classic appearing fundic gland                            throughout the stomach.                           - No specimens collected. Recommendation:           - Patient has a contact number available for                            emergencies. The signs and symptoms of potential                            delayed complications were discussed with the                            patient. Return to normal activities tomorrow.                            Written discharge instructions were provided to the                            patient.                           - Resume previous diet.                           - Continue present medications.                           - OK to resume your blood thinner tomorrow.                           - Dr. Ardis Hughs office will contact you to schedule                            follow up appt to discuss rectal pains, swallowing                            trouble response after this dilation today. Milus Banister, MD 02/10/2021 10:25:51 AM This report has been signed electronically.

## 2021-02-10 NOTE — Progress Notes (Signed)
Review of pertinent gastrointestinal problems:  1. family history of colon cancer (father). Colonoscopy February 2012 found hyperplastic polyp, recall colonoscopy at 5 year interval.   Colonoscopy June 2017 found a single subcentimeter polyp.  This was removed but not retrieved.  Also left colon diverticulosis.  He was recommended to have repeat colonoscopy at 5 years.  2. ? Esophageal dysmotility, achalasia?. Botox injection by previous gastroenterologist [place]. EGD February 2012 suggested a snug E. junction, esophageal manometry March, 2012 showed normal peristalsis but high residual lower esophageal sphincter pressure. Previoulsy was having dysphagia (around 2000 or so). Botox injection by [facility] helped, but caused worse reflux problems. 04/2012 EGD Ardis Hughs with botox injection again helped his symptoms.  High Res esophageal manometry 02/2013 was essentially normal; no sign of achalasia. EGD 10/2018 small HH, otherwise normal examination.10/2018  Repeat esoph manometry:  normal GE junction, + some findings suggestive of hypercontractile esophagus.   HPI: This is a FH CRC, recurrent dysphagia (hypercontractile esophagus? On 10/2018 esoph manometry)   ROS: complete GI ROS as described in HPI, all other review negative.  Constitutional:  No unintentional weight loss   Past Medical History:  Diagnosis Date   Clotting disorder ([place])    Displacement of intervertebral disc, site unspecified, without myelopathy    Esophageal reflux    Headache(784.0)    sinus and migraines   Herpes simplex without mention of complication    Hiatal hernia    HTN (hypertension) 05/10/2012   Hyperlipemia    Hypertrophy (benign) of prostate    Intestinal infection due to other organism, not elsewhere classified    Kidney stones    sees Dr. Junious Silk   Personal history of colonic polyps    Personal history of urinary calculi    Personal history of venous thrombosis and embolism    Renal insufficiency     Past  Surgical History:  Procedure Laterality Date   APPENDECTOMY     BOTOX INJECTION N/A 04/13/2012   Procedure: BOTOX INJECTION;  Surgeon: Milus Banister, MD;  [facility]: [facility];  Service: Endoscopy;  Laterality: N/A;   [place] and 1998   CHOLECYSTECTOMY     COLONOSCOPY  07/03/2015   per Dr. Ardis Hughs, single polyp removed but not retrieved, left sided diverticula, repeat in 5 yrs    ESOPHAGEAL MANOMETRY N/A 02/26/2013   Procedure: ESOPHAGEAL MANOMETRY (EM);  Surgeon: Milus Banister, MD;  [facility];  Service: Endoscopy;  Laterality: N/A;   ESOPHAGEAL MANOMETRY N/A 11/03/2018   Procedure: ESOPHAGEAL MANOMETRY (EM);  Surgeon: Milus Banister, MD;  [facility];  Service: Endoscopy;  Laterality: N/A;   ESOPHAGOGASTRODUODENOSCOPY  10/17/2018   per Dr. Ardis Hughs, small hiatal hernia only    ESOPHAGOGASTRODUODENOSCOPY (EGD) WITH ESOPHAGEAL DILATION N/A 04/13/2012   Procedure: ESOPHAGOGASTRODUODENOSCOPY (EGD) WITH ESOPHAGEAL DILATION;  Surgeon: Milus Banister, MD;  [facility];  Service: Endoscopy;  Laterality: N/A;  possible botox injection   EXTRACORPOREAL SHOCK WAVE LITHOTRIPSY Left 08/14/2020   Procedure: LEFT EXTRACORPOREAL SHOCK WAVE LITHOTRIPSY (ESWL);  Surgeon: [name], MD;  Location: [facility];  Service: Urology;  Laterality: Left;   FINGER SURGERY     3rd finger trigger bilateral    GALLBLADDER SURGERY     LITHOTRIPSY     ROTATOR CUFF REPAIR Right 01-10-12   per Dr. Esmond Plants, also repair torn biceps tendon    STERIOD INJECTION Left 07/27/2013   Procedure: STEROID INJECTION;  Surgeon: Augustin Schooling, MD;  Location:  [place] [facility];  Service: Orthopedics;  Laterality: Left;   TONSILLECTOMY     TRIGGER FINGER RELEASE Right 07/27/2013   Procedure: RIGHT RING FINGER A-1 RELEASE;  Surgeon: Augustin Schooling, MD;  Location: [place];  Service: Orthopedics;  Laterality: Right;   URETERAL STENT PLACEMENT  July 2012   left ureter,  per Dr. Junious Silk, for a stone     Current Outpatient Medications  Medication Sig Dispense Refill   eletriptan (RELPAX) 20 MG tablet Take 1 tablet (20 mg total) by mouth as needed for migraine. 90 tablet 3   ezetimibe (ZETIA) 10 MG tablet Take 1 tablet (10 mg total) by mouth daily. 90 tablet 1   fenofibrate (TRICOR) 145 MG tablet Take 1 tablet (145 mg total) by mouth every evening. 90 tablet 0   folic acid (FOLVITE) 1 MG tablet Take 1 mg by mouth daily.     losartan (COZAAR) 50 MG tablet Take 1 tablet (50 mg total) by mouth daily. 90 tablet 1   magic mouthwash (nystatin, hydrocortisone, diphenhydrAMINE) suspension Swish and spit 5 mLs 3 (three) times daily as needed for mouth pain. 120 mL 0   RABEprazole (ACIPHEX) 20 MG tablet Take 1 tablet (20 mg total) by mouth 2 (two) times daily. 180 tablet 3   acyclovir cream (ZOVIRAX) 5 % Apply 1 application topically daily as needed (fever blisters). 15 g 3   hydrocortisone-pramoxine (PROCTOFOAM HC) rectal foam Place 1 applicator rectally 2 (two) times daily. 10 g 5   metoCLOPramide (REGLAN) 10 MG tablet Take 1 tablet (10 mg total) by mouth every 8 (eight) hours as needed (hiccups). 30 tablet 0   rivaroxaban (XARELTO) 20 MG TABS tablet Take 1 tablet (20 mg total) by mouth daily with supper. 90 tablet 1   temazepam (RESTORIL) 30 MG capsule Take 1 capsule (30 mg total) by mouth at bedtime as needed for sleep. 90 capsule 1   Current Facility-Administered Medications  Medication Dose Route Frequency Provider Last Rate Last Admin   0.9 %  sodium chloride infusion  500 mL Intravenous Once Milus Banister, MD        Allergies as of 02/10/2021 - Review Complete 02/10/2021  Allergen Reaction Noted   Morphine Other (See Comments)    Amitriptyline hcl Other (See Comments)    Amoxicillin-pot clavulanate  01/18/2007   Cyclobenzaprine hcl  07/31/2008   Levofloxacin Swelling 04/16/2016   Lisinopril-hydrochlorothiazide  05/24/2012    Family History  Problem  Relation Age of Onset   Colon cancer Father    Heart attack Father    Heart disease Brother    Heart disease Brother    Esophageal cancer Neg Hx    Rectal cancer Neg Hx    Stomach cancer Neg Hx     Social History   Socioeconomic History   Marital status: Married    Spouse name: Not on file   Number of children: Not on file   Years of education: Not on file   Highest education level: Not on file  Occupational History   Occupation: Retired  Tobacco Use   Smoking status: Former    Packs/day: 1.50    Years: 20.00    Pack years: 30.00    Types: Cigarettes    Quit date: 03/03/1973    Years since quitting: 47.9    Passive exposure: Past   Smokeless tobacco: Never   Tobacco comments:    discussed AAA; did have CT of abd/ pelvis   Vaping Use   Vaping Use:  Never used  Substance and Sexual Activity   Alcohol use: No    Alcohol/week: 0.0 standard drinks   Drug use: No   Sexual activity: Not on file  Other Topics Concern   Not on file  Social History Narrative   Not on file   Social Determinants of Health   Financial Resource Strain: Low Risk    Difficulty of Paying Living Expenses: Not hard at all  Food Insecurity: No Food Insecurity   Worried About Charity fundraiser in the Last Year: Never true   [place] in the Last Year: Never true  Transportation Needs: No Transportation Needs   Lack of Transportation (Medical): No   Lack of Transportation (Non-Medical): No  Physical Activity: Insufficiently Active   Days of Exercise per Week: 3 days   Minutes of Exercise per Session: 30 min  Stress: No Stress Concern Present   Feeling of Stress : Not at all  Social Connections: Moderately Integrated   Frequency of Communication with Friends and Family: Three times a week   Frequency of Social Gatherings with Friends and Family: Three times a week   Attends Religious Services: More than 4 times per year   Active Member of Clubs or Organizations: No   Attends Theatre manager Meetings: Never   Marital Status: Married  Human resources officer Violence: Not At Risk   Fear of Current or Ex-Partner: No   Emotionally Abused: No   Physically Abused: No   Sexually Abused: No     Physical Exam: BP 137/70    Pulse 60    Temp 98.4 F (36.9 C)    Ht 5\' 9"  (1.753 m)    Wt 189 lb (85.7 kg)    SpO2 97%    BMI 27.91 kg/m  Constitutional: generally well-appearing Psychiatric: alert and oriented x3 Lungs: CTA bilaterally Heart: no MCR  Assessment and plan: 71 y.o. male with FH of CRC, reurrent dysphagia  EGD and colonsocopy today  Care is appropriate for the ambulatory setting.  Owens Loffler, MD [facility] 02/10/2021, 9:45 AM

## 2021-02-10 NOTE — Progress Notes (Signed)
Report to PACU, RN, vss, BBS= Clear.  

## 2021-02-10 NOTE — Op Note (Signed)
[place] Patient Name: Steven [name] Procedure Date: 02/10/2021 9:47 AM MRN: [id number] Endoscopist: Milus Banister , MD Age: 71 Referring MD:  Date of Birth: 1950-11-23 Gender: Male Account #: [id number] Procedure:                Colonoscopy Indications:              Screening in patient at increased risk: Family                            history of 1st-degree relative with colorectal                            cancer; family history of colon cancer (father).                            Colonoscopy February 2012 found hyperplastic polyp,                            recall colonoscopy at 5 year                            interval.Colonoscopy June 2017 found a single                            subcentimeter polyp. This was removed but not                            retrieved. Also left colon diverticulosis. He was                            recommended to have repeat colonoscopy at 5 years Medicines:                Monitored Anesthesia Care Procedure:                Pre-Anesthesia Assessment:                           - Prior to the procedure, a History and Physical                            was performed, and patient medications and                            allergies were reviewed. The patient's tolerance of                            previous anesthesia was also reviewed. The risks                            and benefits of the procedure and the sedation                            options and risks were discussed with the patient.  All questions were answered, and informed consent                            was obtained. Prior Anticoagulants: The patient has                            taken Xarelto (rivaroxaban), last dose was 2 days                            prior to procedure. ASA Grade Assessment: II - A                            patient with mild systemic disease. After reviewing                            the risks and  benefits, the patient was deemed in                            satisfactory condition to undergo the procedure.                           After obtaining informed consent, the colonoscope                            was passed under direct vision. Throughout the                            procedure, the patient's blood pressure, pulse, and                            oxygen saturations were monitored continuously. The                            Olympus CF-HQ190L ([id number]) Colonoscope was                            introduced through the anus and advanced to the the                            cecum, identified by appendiceal orifice and                            ileocecal valve. The colonoscopy was performed                            without difficulty. The patient tolerated the                            procedure well. The quality of the bowel                            preparation was good. The ileocecal valve,  appendiceal orifice, and rectum were photographed. Scope In: 9:55:01 AM Scope Out: 10:08:33 AM Scope Withdrawal Time: 0 hours 8 minutes 42 seconds  Total Procedure Duration: 0 hours 13 minutes 32 seconds  Findings:                 Three sessile polyps were found in the transverse                            colon and cecum. The polyps were 1 to 3 mm in size.                            These polyps were removed with a cold snare.                            Resection and retrieval were complete.                           Multiple small and large-mouthed diverticula were                            found in the entire colon.                           Internal hemorrhoids were found. The hemorrhoids                            were small.                           The exam was otherwise without abnormality on                            direct and retroflexion views. Complications:            No immediate complications. Estimated blood loss:                             None. Estimated Blood Loss:     Estimated blood loss: none. Impression:               - Three 1 to 3 mm polyps in the transverse colon                            and in the cecum, removed with a cold snare.                            Resected and retrieved.                           - Diverticulosis in the entire examined colon.                           - Internal hemorrhoids.                           - The examination was otherwise normal on direct  and retroflexion views. Recommendation:           - Await pathology results.                           - EGD now. Milus Banister, MD 02/10/2021 10:11:53 AM This report has been signed electronically.

## 2021-02-10 NOTE — Progress Notes (Signed)
Called to room to assist during endoscopic procedure.  Patient ID and intended procedure confirmed with present staff. Received instructions for my participation in the procedure from the performing physician.  

## 2021-02-11 ENCOUNTER — Other Ambulatory Visit: Payer: Self-pay

## 2021-02-12 ENCOUNTER — Telehealth: Payer: Self-pay

## 2021-02-12 NOTE — Telephone Encounter (Signed)
°  Follow up Call-  Call back number 02/10/2021 10/17/2018  Post procedure Call Back phone  # [phone]  Permission to leave phone message Yes Yes  Some recent data might be hidden     Patient questions:  Do you have a fever, pain , or abdominal swelling? No. Pain Score  0 *  Have you tolerated food without any problems? Yes.    Have you been able to return to your normal activities? Yes.    Do you have any questions about your discharge instructions: Diet   No. Medications  No. Follow up visit  No.  Do you have questions or concerns about your Care? No.  Actions: * If pain score is 4 or above: No action needed, pain <4.   Have you developed a fever since your procedure? no  2.   Have you had an respiratory symptoms (SOB or cough) since your procedure? no  3.   Have you tested positive for COVID 19 since your procedure no  4.   Have you had any family members/close contacts diagnosed with the COVID 19 since your procedure?  no   If yes to any of these questions please route to Joylene John, RN and Joella Prince, RN

## 2021-02-13 ENCOUNTER — Encounter: Payer: Self-pay | Admitting: Gastroenterology

## 2021-02-23 DIAGNOSIS — L821 Other seborrheic keratosis: Secondary | ICD-10-CM | POA: Diagnosis not present

## 2021-02-23 DIAGNOSIS — C44319 Basal cell carcinoma of skin of other parts of face: Secondary | ICD-10-CM | POA: Diagnosis not present

## 2021-02-23 DIAGNOSIS — R208 Other disturbances of skin sensation: Secondary | ICD-10-CM | POA: Diagnosis not present

## 2021-03-05 DIAGNOSIS — N401 Enlarged prostate with lower urinary tract symptoms: Secondary | ICD-10-CM | POA: Diagnosis not present

## 2021-03-05 DIAGNOSIS — N182 Chronic kidney disease, stage 2 (mild): Secondary | ICD-10-CM | POA: Diagnosis not present

## 2021-03-05 DIAGNOSIS — R3912 Poor urinary stream: Secondary | ICD-10-CM | POA: Diagnosis not present

## 2021-03-05 DIAGNOSIS — N200 Calculus of kidney: Secondary | ICD-10-CM | POA: Diagnosis not present

## 2021-03-17 DIAGNOSIS — M25511 Pain in right shoulder: Secondary | ICD-10-CM | POA: Diagnosis not present

## 2021-03-22 DIAGNOSIS — M25511 Pain in right shoulder: Secondary | ICD-10-CM | POA: Diagnosis not present

## 2021-03-26 DIAGNOSIS — Z85828 Personal history of other malignant neoplasm of skin: Secondary | ICD-10-CM | POA: Diagnosis not present

## 2021-03-26 DIAGNOSIS — Z08 Encounter for follow-up examination after completed treatment for malignant neoplasm: Secondary | ICD-10-CM | POA: Diagnosis not present

## 2021-03-26 DIAGNOSIS — L820 Inflamed seborrheic keratosis: Secondary | ICD-10-CM | POA: Diagnosis not present

## 2021-04-01 ENCOUNTER — Encounter: Payer: Self-pay | Admitting: Gastroenterology

## 2021-04-01 ENCOUNTER — Ambulatory Visit: Payer: PPO | Admitting: Gastroenterology

## 2021-04-01 VITALS — BP 144/76 | HR 60 | Ht 69.0 in | Wt 195.0 lb

## 2021-04-01 DIAGNOSIS — Z800 Family history of malignant neoplasm of digestive organs: Secondary | ICD-10-CM | POA: Diagnosis not present

## 2021-04-01 DIAGNOSIS — R1310 Dysphagia, unspecified: Secondary | ICD-10-CM | POA: Diagnosis not present

## 2021-04-01 NOTE — Progress Notes (Signed)
Review of pertinent gastrointestinal problems:  ?1. family history of colon cancer (father in his 91s). Colonoscopy February 2012 found hyperplastic polyp, recall colonoscopy at 5 year interval.   Colonoscopy June 2017 found a single subcentimeter polyp.  This was removed but not retrieved.  Also left colon diverticulosis.  He was recommended to have repeat colonoscopy at 5 years.  Colonoscopy 01/2021 2 subcentimeter adenomas removed.  Recall recommended 7 years on path results letter however changes to 5 years after discussing more with him in the office, his father had colon cancer in his early 34s. ?2. ? Esophageal dysmotility, achalasia?. Botox injection by previous gastroenterologist [place]. EGD February 2012 suggested a snug E. junction, esophageal manometry March, 2012 showed normal peristalsis but high residual lower esophageal sphincter pressure. Previoulsy was having dysphagia (around 2000 or so). Botox injection by [facility] helped, but caused worse reflux problems. 04/2012 EGD Ardis Hughs with botox injection again helped his symptoms.  High Res esophageal manometry 02/2013 was essentially normal; no sign of achalasia. EGD 10/2018 small HH, otherwise normal examination.10/2018  Repeat esoph manometry:  normal GE junction, + some findings suggestive of hypercontractile esophagus.  Diltiazem did not help.  EGD 01/2021 medium sized hiatal hernia, tortuous foreshortened esophagus, minor Schatzki's ring dilated to 20 mm. ? ? ?HPI: ?This is a very pleasant 71 year old man whom I last saw the time of EGD and colonoscopy.  See those results summarized above. ? ?He might of gotten very minor relief from the balloon dilation of his GE junction.  He still has mild liquid greater than solid associated dysphagia 2 or 3 times per week.  He has been dealing with this for at least 20 years. ? ?ROS: complete GI ROS as described in HPI, all other review negative. ? ?Constitutional:  No unintentional weight loss ? ? ?Past Medical  History:  ?Diagnosis Date  ? Clotting disorder ([place])   ? Displacement of intervertebral disc, site unspecified, without myelopathy   ? Esophageal reflux   ? Headache(784.0)   ? sinus and migraines  ? Herpes simplex without mention of complication   ? Hiatal hernia   ? HTN (hypertension) 05/10/2012  ? Hyperlipemia   ? Hypertrophy (benign) of prostate   ? Intestinal infection due to other organism, not elsewhere classified   ? Kidney stones   ? sees Dr. Junious Silk  ? Personal history of colonic polyps   ? Personal history of urinary calculi   ? Personal history of venous thrombosis and embolism   ? Renal insufficiency   ? ? ?Past Surgical History:  ?Procedure Laterality Date  ? APPENDECTOMY    ? BOTOX INJECTION N/A 04/13/2012  ? Procedure: BOTOX INJECTION;  Surgeon: Milus Banister, MD;  [facility];  Service: Endoscopy;  Laterality: N/A;  ? [place]  ? CHOLECYSTECTOMY    ? COLONOSCOPY  07/03/2015  ? per Dr. Ardis Hughs, single polyp removed but not retrieved, left sided diverticula, repeat in 5 yrs   ? ESOPHAGEAL MANOMETRY N/A 02/26/2013  ? Procedure: ESOPHAGEAL MANOMETRY (EM);  Surgeon: Milus Banister, MD;  [facility];  Service: Endoscopy;  Laterality: N/A;  ? ESOPHAGEAL MANOMETRY N/A 11/03/2018  ? Procedure: ESOPHAGEAL MANOMETRY (EM);  Surgeon: Milus Banister, MD;  [facility];  Service: Endoscopy;  Laterality: N/A;  ? ESOPHAGOGASTRODUODENOSCOPY  10/17/2018  ? per Dr. Ardis Hughs, small hiatal hernia only   ? ESOPHAGOGASTRODUODENOSCOPY (EGD) WITH ESOPHAGEAL DILATION N/A 04/13/2012  ? Procedure: ESOPHAGOGASTRODUODENOSCOPY (EGD) WITH ESOPHAGEAL DILATION;  Surgeon: Milus Banister, MD;  Location: [facility];  Service: Endoscopy;  Laterality: N/A;  possible botox injection  ? EXTRACORPOREAL SHOCK WAVE LITHOTRIPSY Left 08/14/2020  ? Procedure: LEFT EXTRACORPOREAL SHOCK WAVE LITHOTRIPSY (ESWL);  Surgeon: [name], MD;  Location: [facility];   Service: Urology;  Laterality: Left;  ? FINGER SURGERY    ? 3rd finger trigger bilateral   ? GALLBLADDER SURGERY    ? LITHOTRIPSY    ? ROTATOR CUFF REPAIR Right 01-10-12  ? per Dr. Esmond Plants, also repair torn biceps tendon   ? STERIOD INJECTION Left 07/27/2013  ? Procedure: STEROID INJECTION;  Surgeon: Augustin Schooling, MD;  Location: [place];  Service: Orthopedics;  Laterality: Left;  ? TONSILLECTOMY    ? TRIGGER FINGER RELEASE Right 07/27/2013  ? Procedure: RIGHT RING FINGER A-1 RELEASE;  Surgeon: Augustin Schooling, MD;  Location: [place];  Service: Orthopedics;  Laterality: Right;  ? URETERAL STENT PLACEMENT  July 2012  ? left ureter, per Dr. Junious Silk, for a stone   ? ? ?Current Outpatient Medications  ?Medication Instructions  ? acyclovir cream (ZOVIRAX) 5 % 1 application., Topical, Daily PRN  ? eletriptan (RELPAX) 20 mg, Oral, As needed  ? ezetimibe (ZETIA) 10 mg, Oral, Daily  ? fenofibrate (TRICOR) 145 mg, Oral, Every evening  ? folic acid (FOLVITE) 1 mg, Oral, Daily  ? hydrocortisone-pramoxine (PROCTOFOAM HC) rectal foam 1 applicator, Rectal, 2 times daily  ? losartan (COZAAR) 50 mg, Oral, Daily  ? magic mouthwash (nystatin, hydrocortisone, diphenhydrAMINE) suspension 5 mLs, Swish & Spit, 3 times daily PRN  ? metoCLOPramide (REGLAN) 10 mg, Oral, Every 8 hours PRN  ? RABEprazole (ACIPHEX) 20 mg, Oral, 2 times daily  ? rivaroxaban (XARELTO) 20 mg, Oral, Daily with supper  ? temazepam (RESTORIL) 30 mg, Oral, At bedtime PRN  ? ? ?Allergies as of 04/01/2021 - Review Complete 04/01/2021  ?Allergen Reaction Noted  ? Morphine Other (See Comments)   ? Amitriptyline hcl Other (See Comments)   ? Amoxicillin-pot clavulanate  01/18/2007  ? Cyclobenzaprine hcl  07/31/2008  ? Levofloxacin Swelling 04/16/2016  ? Lisinopril-hydrochlorothiazide  05/24/2012  ? ? ?Family History  ?Problem Relation Age of Onset  ? Colon cancer Father   ? Heart attack Father   ? Heart disease Brother   ? Heart disease Brother   ? Esophageal cancer Neg  Hx   ? Rectal cancer Neg Hx   ? Stomach cancer Neg Hx   ? ? ?Social History  ? ?Socioeconomic History  ? Marital status: Married  ?  Spouse name: Not on file  ? Number of children: Not on file  ? Years of education: Not on file  ? Highest education level: Not on file  ?Occupational History  ? Occupation: Retired  ?Tobacco Use  ? Smoking status: Former  ?  Packs/day: 1.50  ?  Years: 20.00  ?  Pack years: 30.00  ?  Types: Cigarettes  ?  Quit date: 03/03/1973  ?  Years since quitting: 48.1  ?  Passive exposure: Past  ? Smokeless tobacco: Never  ? Tobacco comments:  ?  discussed AAA; did have CT of abd/ pelvis   ?Vaping Use  ? Vaping Use: Never used  ?Substance and Sexual Activity  ? Alcohol use: No  ?  Alcohol/week: 0.0 standard drinks  ? Drug use: No  ? Sexual activity: Not on file  ?Other Topics Concern  ? Not on file  ?Social History Narrative  ? Not  on file  ? ?Social Determinants of Health  ? ?Financial Resource Strain: Low Risk   ? Difficulty of Paying Living Expenses: Not hard at all  ?Food Insecurity: No Food Insecurity  ? Worried About Charity fundraiser in the Last Year: Never true  ? Ran Out of Food in the Last Year: Never true  ?Transportation Needs: No Transportation Needs  ? Lack of Transportation (Medical): No  ? Lack of Transportation (Non-Medical): No  ?Physical Activity: Insufficiently Active  ? Days of Exercise per Week: 3 days  ? Minutes of Exercise per Session: 30 min  ?Stress: No Stress Concern Present  ? Feeling of Stress : Not at all  ?Social Connections: Moderately Integrated  ? Frequency of Communication with Friends and Family: Three times a week  ? Frequency of Social Gatherings with Friends and Family: Three times a week  ? Attends Religious Services: More than 4 times per year  ? Active Member of Clubs or Organizations: No  ? Attends Archivist Meetings: Never  ? Marital Status: Married  ?Intimate Partner Violence: Not At Risk  ? Fear of Current or Ex-Partner: No  ? Emotionally  Abused: No  ? Physically Abused: No  ? Sexually Abused: No  ? ? ? ?Physical Exam: ?BP (!) 144/76   Pulse 60   Ht '5\' 9"'$  (1.753 m)   Wt 195 lb (88.5 kg)   BMI 28.80 kg/m?  ?Constitutional: generally well-appearing ?P

## 2021-04-01 NOTE — Patient Instructions (Signed)
If you are age 71 or older, your body mass index should be between 23-30. Your Body mass index is 28.8 kg/m?Marland Kitchen If this is out of the aforementioned range listed, please consider follow up with your Primary Care Provider. ?_______________________________________________________ ? ?The [facility] providers would like to encourage you to use [facility] to communicate with providers for non-urgent requests or questions.  Due to long hold times on the telephone, sending your provider a message by [facility] may be a faster and more efficient way to get a response.  Please allow 48 business hours for a response.  Please remember that this is for non-urgent requests.  ?_______________________________________________________ ? ?You will be due for colonoscopy in Jan 2028.  We will contact you to schedule this appointment. ? ?Try using Altoids prior to larger meals.  Take with a large glass of water. ? ?Thank you for entrusting me with your care and choosing [facility]. ? ?Dr Ardis Hughs ? ?

## 2021-04-02 DIAGNOSIS — M19011 Primary osteoarthritis, right shoulder: Secondary | ICD-10-CM | POA: Diagnosis not present

## 2021-04-02 DIAGNOSIS — M25511 Pain in right shoulder: Secondary | ICD-10-CM | POA: Diagnosis not present

## 2021-04-08 ENCOUNTER — Telehealth: Payer: Self-pay | Admitting: Pharmacist

## 2021-04-08 NOTE — Chronic Care Management (AMB) (Signed)
? ? ?Chronic Care Management ?Pharmacy Assistant  ? ?Name: Steven [name]  MRN: [id number] DOB: [birth year] ? ? ?Reason for Encounter: Disease State / Hypertension Assessment Call ?  ?Conditions to be addressed/monitored: ?HTN ? ? ?Recent office visits:  ?12/18/2020 Alysia Penna MD - Patient was seen for preventative health care and additional issues. Discontinued Nystatin. No follow up noted.  ? ?Recent consult visits:  ?04/01/2021 Owens Loffler MD - Patient was seen for dysphagia and additional issues. No medication changes. No follow up noted.  ? ?02/23/2021 Allyn Kenner (dermatology) - Patient was seen for other disturbances of skin sensation and additional issues. No other chart notes.  ? ?01/14/2021 Roseanne Kaufman MD (hand surgery) - Patient was seen for pain in right fingers and additional issues. No other chart notes.  ? ?11/14/2020 Amy Esterwood PA-C (GI) - Patient was seen for family history of colon cancer and additional issues. Started Anusol HC nightly and Na Sulfate-K Sulfate-Mg Sulf 17.5-3.13-1.6 GM/177ML 1 kit. Discontinued Albuterol HFA. No follow up noted.  ? ?Hospital visits:  ?Patient was seen at [facility] ED on 01/17/2021 (4 hours) due to hiccups.    ?New?Medications Started at [facility] Discharge:?? ?-started Reglan 10 mg  ?Medication Changes at Hospital Discharge: ?-Changed No medication changes  ?Medications Discontinued at Hospital Discharge: ?-Stopped No medications discontinued.  ?Medications that remain the same after Hospital Discharge:??  ?-All other medications will remain the same.   ? ?01/16/2021 Patient was seen at ED (ED unknown) for bradycardia, unspecified hiccough.  ? ?Medications: ?Outpatient Encounter Medications as of 04/08/2021  ?Medication Sig  ? acyclovir cream (ZOVIRAX) 5 % Apply 1 application topically daily as needed (fever blisters).  ? eletriptan (RELPAX) 20 MG tablet Take 1 tablet (20 mg total) by mouth as needed for migraine.  ? ezetimibe (ZETIA) 10 MG tablet  Take 1 tablet (10 mg total) by mouth daily.  ? fenofibrate (TRICOR) 145 MG tablet Take 1 tablet (145 mg total) by mouth every evening.  ? folic acid (FOLVITE) 1 MG tablet Take 1 mg by mouth daily.  ? hydrocortisone-pramoxine (PROCTOFOAM HC) rectal foam Place 1 applicator rectally 2 (two) times daily.  ? losartan (COZAAR) 50 MG tablet Take 1 tablet (50 mg total) by mouth daily.  ? magic mouthwash (nystatin, hydrocortisone, diphenhydrAMINE) suspension Swish and spit 5 mLs 3 (three) times daily as needed for mouth pain.  ? metoCLOPramide (REGLAN) 10 MG tablet Take 1 tablet (10 mg total) by mouth every 8 (eight) hours as needed (hiccups).  ? RABEprazole (ACIPHEX) 20 MG tablet Take 1 tablet (20 mg total) by mouth 2 (two) times daily.  ? rivaroxaban (XARELTO) 20 MG TABS tablet Take 1 tablet (20 mg total) by mouth daily with supper.  ? temazepam (RESTORIL) 30 MG capsule Take 1 capsule (30 mg total) by mouth at bedtime as needed for sleep.  ? ?No facility-administered encounter medications on file as of 04/08/2021.  ?Fill History: ?CLOBETASOL 0.05 % CREAM (GRAM) 03/05/2021 30  ? ?EZETIMIBE 10 MG TABLET 02/09/2021 90  ? ?FENOFIBRATE NANOCRYSTALLIZED 145 MG TABLET 12/27/2020 90  ? ?LOSARTAN 50 MG TABLET 02/09/2021 90  ? ?METOCLOPRAMIDE HCL 10 MG TABLET 01/17/2021 10  ? ?RABEPRAZOLE 20 MG TABLET, DELAYED RELEASE (ENTERIC COATED) 01/26/2021 90  ? ?XARELTO 20 MG TABLET 03/16/2021 90  ? ? ?Reviewed chart prior to disease state call. Spoke with patient regarding BP ? ?Recent Office Vitals: ?BP Readings from Last 3 Encounters:  ?04/01/21 (!) 144/76  ?02/10/21 (!) 108/59  ?01/17/21 121/79  ? ?  Pulse Readings from Last 3 Encounters:  ?04/01/21 60  ?02/10/21 (!) 55  ?01/17/21 (!) 53  ?  ?Wt Readings from Last 3 Encounters:  ?04/01/21 195 lb (88.5 kg)  ?02/10/21 189 lb (85.7 kg)  ?01/16/21 184 lb (83.5 kg)  ?  ? ?Kidney Function ?Lab Results  ?Component Value Date/Time  ? CREATININE 1.64 (H) 01/16/2021 08:18 PM  ? CREATININE 1.45  12/18/2020 08:57 AM  ? CREATININE 1.36 (H) 11/14/2019 10:57 AM  ? GFR 48.27 (L) 12/18/2020 08:57 AM  ? GFRNONAA 44 (L) 01/16/2021 08:18 PM  ? GFRNONAA 52 (L) 11/14/2019 10:57 AM  ? GFRAA 54 (L) 02/01/2020 12:13 PM  ? GFRAA 60 11/14/2019 10:57 AM  ? ? ?Current antihypertensive regimen:  ?Losartan 50 mg daily ? ?How often are you checking your Blood Pressure? Patient states he will check his blood pressures about every two weeks. ? ?Current home BP readings: Patient states todays reading is 128/70 ? ?What recent interventions/DTPs have been made by any provider to improve Blood Pressure control since last CPP Visit: No recent interventions  ? ?Any recent hospitalizations or ED visits since last visit with CPP? Yes, ED visit on 01/17/2021 ? ?What diet changes have been made to improve Blood Pressure Control?  ?Patient follows  ?Breakfast - patient will have eggs, bacon and toast ?Lunch - patient will have a sandwich or a full well rounded meal ?Dinner - patient will have a meal with a meat, vegetable and starch ? ?What exercise is being done to improve your Blood Pressure Control?  ?Patient stays active except when it is raining.  He states he stays active daily.  ? ?Adherence Review: ?Is the patient currently on ACE/ARB medication? Yes ?Does the patient have >5 day gap between last estimated fill dates? No ? ?Care Gaps: ?AWV - completed 07/29/2020 ?Last BP - 144/79 on 04/01/2021 ?Last A1C - 5.7 on 12/18/2020 ?Covid booster - never done ?Hep C screen - never done ?Shingrix - never done  ? ?Star Rating Drugs: ?Losartan 50 mg - last filled 02/09/2021 at [facility] ? ?Gennie Alma CMA  ?Clinical Pharmacist Assistant ?[phone] ? ?

## 2021-04-13 DIAGNOSIS — H43812 Vitreous degeneration, left eye: Secondary | ICD-10-CM | POA: Diagnosis not present

## 2021-04-13 DIAGNOSIS — H3561 Retinal hemorrhage, right eye: Secondary | ICD-10-CM | POA: Diagnosis not present

## 2021-04-13 DIAGNOSIS — H40013 Open angle with borderline findings, low risk, bilateral: Secondary | ICD-10-CM | POA: Diagnosis not present

## 2021-04-13 DIAGNOSIS — H2513 Age-related nuclear cataract, bilateral: Secondary | ICD-10-CM | POA: Diagnosis not present

## 2021-04-13 DIAGNOSIS — H11042 Peripheral pterygium, stationary, left eye: Secondary | ICD-10-CM | POA: Diagnosis not present

## 2021-04-13 DIAGNOSIS — G43809 Other migraine, not intractable, without status migrainosus: Secondary | ICD-10-CM | POA: Diagnosis not present

## 2021-05-01 DIAGNOSIS — M25511 Pain in right shoulder: Secondary | ICD-10-CM | POA: Diagnosis not present

## 2021-05-04 ENCOUNTER — Other Ambulatory Visit: Payer: Self-pay | Admitting: Orthopedic Surgery

## 2021-05-04 DIAGNOSIS — Z01818 Encounter for other preprocedural examination: Secondary | ICD-10-CM

## 2021-05-11 ENCOUNTER — Other Ambulatory Visit: Payer: Self-pay

## 2021-05-11 ENCOUNTER — Telehealth: Payer: Self-pay | Admitting: Family Medicine

## 2021-05-11 DIAGNOSIS — I82409 Acute embolism and thrombosis of unspecified deep veins of unspecified lower extremity: Secondary | ICD-10-CM

## 2021-05-11 MED ORDER — RIVAROXABAN 20 MG PO TABS: 20.0000 mg | ORAL_TABLET | Freq: Every day | ORAL | 1 refills | Status: DC

## 2021-05-11 NOTE — Telephone Encounter (Signed)
Pt wife is calling and pt needs a refill on rivaroxaban (XARELTO) 20 MG TABS tablet  ?Herbalist [facility]) - [place], [place] Phone:  [phone]  ?Fax:  [phone]  ?  ? ?

## 2021-05-11 NOTE — Telephone Encounter (Signed)
Refill for Xarelto sent to [facility]. ?

## 2021-05-15 ENCOUNTER — Ambulatory Visit (INDEPENDENT_AMBULATORY_CARE_PROVIDER_SITE_OTHER): Payer: PPO | Admitting: Family Medicine

## 2021-05-15 ENCOUNTER — Encounter: Payer: Self-pay | Admitting: Family Medicine

## 2021-05-15 ENCOUNTER — Telehealth: Payer: Self-pay | Admitting: Family Medicine

## 2021-05-15 VITALS — BP 124/80 | HR 61 | Temp 98.9°F | Ht 69.0 in | Wt 195.0 lb

## 2021-05-15 DIAGNOSIS — G609 Hereditary and idiopathic neuropathy, unspecified: Secondary | ICD-10-CM

## 2021-05-15 DIAGNOSIS — K219 Gastro-esophageal reflux disease without esophagitis: Secondary | ICD-10-CM | POA: Diagnosis not present

## 2021-05-15 DIAGNOSIS — I2699 Other pulmonary embolism without acute cor pulmonale: Secondary | ICD-10-CM | POA: Diagnosis not present

## 2021-05-15 DIAGNOSIS — I10 Essential (primary) hypertension: Secondary | ICD-10-CM

## 2021-05-15 DIAGNOSIS — G8929 Other chronic pain: Secondary | ICD-10-CM

## 2021-05-15 DIAGNOSIS — I82401 Acute embolism and thrombosis of unspecified deep veins of right lower extremity: Secondary | ICD-10-CM | POA: Diagnosis not present

## 2021-05-15 DIAGNOSIS — N289 Disorder of kidney and ureter, unspecified: Secondary | ICD-10-CM | POA: Diagnosis not present

## 2021-05-15 DIAGNOSIS — E782 Mixed hyperlipidemia: Secondary | ICD-10-CM

## 2021-05-15 DIAGNOSIS — M25511 Pain in right shoulder: Secondary | ICD-10-CM | POA: Diagnosis not present

## 2021-05-15 MED ORDER — ENOXAPARIN SODIUM 80 MG/0.8ML IJ SOSY: 80.0000 mg | PREFILLED_SYRINGE | Freq: Two times a day (BID) | INTRAMUSCULAR | 0 refills | Status: DC

## 2021-05-15 MED ORDER — RABEPRAZOLE SODIUM 20 MG PO TBEC
20.0000 mg | DELAYED_RELEASE_TABLET | Freq: Two times a day (BID) | ORAL | 3 refills | Status: DC
Start: 2021-05-15 — End: 2022-10-01

## 2021-05-15 MED ORDER — TEMAZEPAM 30 MG PO CAPS: 30.0000 mg | ORAL_CAPSULE | Freq: Every evening | ORAL | 1 refills | Status: DC | PRN

## 2021-05-15 NOTE — Progress Notes (Signed)
? ?  Subjective:  ? ? Patient ID: Steven [name], male    DOB: 1950-04-03, 71 y.o.   MRN: [id number] ? ?HPI ?Here for preoperative clearance to have a right shoulder total replacement per Dr. Esmond Plants. This will be sometime in June. [name] feels fine and he has no complaints. He had a complete well exam with labs in December, and his creatinine was stable at 1.64. His last EKG on 01-16-21 was normal. His CT cardiac calcium score was 0 on 02-05-20. His ECHO on 02-14-20 was normal with an EF of 55-60%. 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  ? ? ?Review of Systems  ?Constitutional: Negative.   ?Respiratory: Negative.    ?Cardiovascular: Negative.   ?Gastrointestinal: Negative.   ?Genitourinary: Negative.   ?Neurological: Negative.   ? ?   ?Objective:  ? Physical Exam ?Constitutional:   ?   Appearance: Normal appearance.  ?Cardiovascular:  ?   Rate and Rhythm: Normal rate and regular rhythm.  ?   Pulses: Normal pulses.  ?   Heart sounds: Normal heart sounds.  ?Pulmonary:  ?   Effort: Pulmonary effort is normal.  ?   Breath sounds: Normal breath sounds.  ?Abdominal:  ?   General: Abdomen is flat. Bowel sounds are normal. There is no distension.  ?   Palpations: Abdomen is soft. There is no mass.  ?   Tenderness: There is no abdominal tenderness. There is no guarding or rebound.  ?   Hernia: No hernia is present.  ?Lymphadenopathy:  ?   Cervical: No cervical adenopathy.  ?Skin: ?   General: Skin is warm and dry.  ?Neurological:  ?   General: No focal deficit present.  ?   Mental Status: He is alert. Mental status is at baseline.  ? ? ? ? ? ?   ?Assessment & Plan:  ?Preoperative clearance prior to a shoulder total arthroplasty. He is cleared for the surgery, but he will hold the Xarelto for 5 days prior to the surgery, and he will resume taking this the day after surgery. We will provide him with a Lovenox bridge during this interval period. We spent a total of (31   ) minutes reviewing records and  discussing these issues.  ? ?Alysia Penna, MD ? ? ?

## 2021-05-19 ENCOUNTER — Telehealth: Payer: Self-pay | Admitting: Family Medicine

## 2021-05-19 ENCOUNTER — Telehealth: Payer: Self-pay | Admitting: Pharmacist

## 2021-05-19 NOTE — Chronic Care Management (AMB) (Signed)
    Chronic Care Management Pharmacy Assistant   Name: Steven [name]  MRN: [id number] DOB: [birth year]  05/20/2021 APPOINTMENT [place], he requested this appointment with Jeni Salles, Pharm. D on 05/20/2021 at 1:00. Patient does not feel the need for our services. He has requested [name] not to remove him at this time as he plans to discuss this with Dr. Sarajane Jews before making his final decision.    Care Gaps: AWV - completed 07/29/2020 Last BP - 124/80 on 05/15/2021 Last A1C - 5.7 on 12/18/2020 Covid booster - never done Hep C screen - never done Shingrix - never done    Star Rating Drugs: Losartan 50 mg - last filled 05/14/2021 90 DS at [place]   Any gaps in medications fill history? No  Gennie Alma [facility]  Catering manager [phone]

## 2021-05-19 NOTE — Telephone Encounter (Signed)
Please advise 

## 2021-05-19 NOTE — Telephone Encounter (Signed)
Pt c/o sinus, congestion, coughing post nasal drip x 5 days. Treated with Claritin and sudafed with no relief. Requesting an antibiotic. Offered an OV, declined ?

## 2021-05-20 ENCOUNTER — Other Ambulatory Visit: Payer: Self-pay

## 2021-05-20 ENCOUNTER — Telehealth: Payer: PPO

## 2021-05-20 DIAGNOSIS — J40 Bronchitis, not specified as acute or chronic: Secondary | ICD-10-CM

## 2021-05-20 DIAGNOSIS — R300 Dysuria: Secondary | ICD-10-CM

## 2021-05-20 MED ORDER — AZITHROMYCIN 250 MG PO TABS: ORAL_TABLET | ORAL | 0 refills | Status: AC

## 2021-05-20 NOTE — Telephone Encounter (Signed)
Call in a Zpack  ?

## 2021-05-20 NOTE — Telephone Encounter (Signed)
Prescription for Z-pack sent to [place].   Contacted patient's wife Rise Paganini message given.  ?

## 2021-05-27 NOTE — H&P (Signed)
Patient's anticipated LOS is less than 2 midnights, meeting these requirements: - Younger than 9 - Lives within 1 hour of care - Has a competent adult at home to recover with post-op recover - NO history of  - Chronic pain requiring opiods  - Diabetes  - Coronary Artery Disease  - Heart failure  - Heart attack  - Stroke  - DVT/VTE  - Cardiac arrhythmia  - Respiratory Failure/COPD  - Renal failure  - Anemia  - Advanced Liver disease     [name] is an 71 y.o. male.    Chief Complaint: right shoulder pain  HPI: Pt is a 71 y.o. male complaining of right shoulder  pain for multiple years. Pain had continually increased since the beginning. X-rays in the clinic show end-stage arthritic changes of the right shoulder. Pt has tried various conservative treatments which have failed to alleviate their symptoms, including injections and therapy. Various options are discussed with the patient. Risks, benefits and expectations were discussed with the patient. Patient understand the risks, benefits and expectations and wishes to proceed with surgery.   PCP:  Laurey Morale, MD  D/C Plans: Home  PMH: Past Medical History:  Diagnosis Date   Clotting disorder ([place])    Displacement of intervertebral disc, site unspecified, without myelopathy    Esophageal reflux    Headache(784.0)    sinus and migraines   Herpes simplex without mention of complication    Hiatal hernia    HTN (hypertension) 05/10/2012   Hyperlipemia    Hypertrophy (benign) of prostate    Intestinal infection due to other organism, not elsewhere classified    Kidney stones    sees Dr. Junious Silk   Personal history of colonic polyps    Personal history of urinary calculi    Personal history of venous thrombosis and embolism    Renal insufficiency     PSH: Past Surgical History:  Procedure Laterality Date   APPENDECTOMY     BOTOX INJECTION N/A 04/13/2012   Procedure: BOTOX INJECTION;  Surgeon: Milus Banister,  MD;  Location: [facility];  Service: Endoscopy;  Laterality: N/A;   [place] and 1998   CHOLECYSTECTOMY     COLONOSCOPY  07/03/2015   per Dr. Ardis Hughs, single polyp removed but not retrieved, left sided diverticula, repeat in 5 yrs    ESOPHAGEAL MANOMETRY N/A 02/26/2013   Procedure: ESOPHAGEAL MANOMETRY (EM);  Surgeon: Milus Banister, MD;  Location: [facility];  Service: Endoscopy;  Laterality: N/A;   ESOPHAGEAL MANOMETRY N/A 11/03/2018   Procedure: ESOPHAGEAL MANOMETRY (EM);  Surgeon: Milus Banister, MD;  Location: [facility];  Service: Endoscopy;  Laterality: N/A;   ESOPHAGOGASTRODUODENOSCOPY  10/17/2018   per Dr. Ardis Hughs, small hiatal hernia only    ESOPHAGOGASTRODUODENOSCOPY (EGD) WITH ESOPHAGEAL DILATION N/A 04/13/2012   Procedure: ESOPHAGOGASTRODUODENOSCOPY (EGD) WITH ESOPHAGEAL DILATION;  Surgeon: Milus Banister, MD;  Location: [facility];  Service: Endoscopy;  Laterality: N/A;  possible botox injection   EXTRACORPOREAL SHOCK WAVE LITHOTRIPSY Left 08/14/2020   Procedure: LEFT EXTRACORPOREAL SHOCK WAVE LITHOTRIPSY (ESWL);  Surgeon: [name], MD;  Location: [facility];  Service: Urology;  Laterality: Left;   FINGER SURGERY     3rd finger trigger bilateral    GALLBLADDER SURGERY     LITHOTRIPSY     ROTATOR CUFF REPAIR Right 01-10-12   per Dr. Esmond Plants, also repair torn biceps tendon    STERIOD INJECTION Left 07/27/2013   Procedure: STEROID  INJECTION;  Surgeon: Augustin Schooling, MD;  Location: [facility];  Service: Orthopedics;  Laterality: Left;   TONSILLECTOMY     TRIGGER FINGER RELEASE Right 07/27/2013   Procedure: RIGHT RING FINGER A-1 RELEASE;  Surgeon: Augustin Schooling, MD;  Location: [place];  Service: Orthopedics;  Laterality: Right;   URETERAL STENT PLACEMENT  July 2012   left ureter, per Dr. Junious Silk, for a stone     Social History:  reports that he quit smoking about 48 years ago. His smoking use included cigarettes. He has a 30.00  pack-year smoking history. He has been exposed to tobacco smoke. He has never used smokeless tobacco. He reports that he does not drink alcohol and does not use drugs. BMI: Estimated body mass index is 28.8 kg/m as calculated from the following:   Height as of 05/15/21: '5\' 9"'$  (1.753 m).   Weight as of 05/15/21: 88.5 kg.  Lab Results  Component Value Date   ALBUMIN 4.5 12/18/2020   Diabetes: Patient does not have a diagnosis of diabetes. Lab Results  Component Value Date   HGBA1C 5.7 12/18/2020     Smoking Status: Social History   Tobacco Use  Smoking Status Former   Packs/day: 1.50   Years: 20.00   Pack years: 30.00   Types: Cigarettes   Quit date: 03/03/1973   Years since quitting: 48.2   Passive exposure: Past  Smokeless Tobacco Never  Tobacco Comments   discussed AAA; did have CT of abd/ pelvis    The patient is not currently a tobacco user. Counseling given: Not Answered Tobacco comments: discussed AAA; did have CT of abd/ pelvis      Allergies:  Allergies  Allergen Reactions   Morphine Other (See Comments)    headache   Amitriptyline Hcl Other (See Comments)    Mood changes   Amoxicillin-Pot Clavulanate     REACTION: nausea   Cyclobenzaprine Hcl     unknown   Levofloxacin Swelling   Lisinopril-Hydrochlorothiazide     depression    Medications: No current facility-administered medications for this encounter.   Current Outpatient Medications  Medication Sig Dispense Refill   acyclovir cream (ZOVIRAX) 5 % Apply 1 application topically daily as needed (fever blisters). 15 g 3   eletriptan (RELPAX) 20 MG tablet Take 1 tablet (20 mg total) by mouth as needed for migraine. 90 tablet 3   enoxaparin (LOVENOX) 80 MG/0.8ML injection Inject 0.8 mLs (80 mg total) into the skin every 12 (twelve) hours. 12 mL 0   ezetimibe (ZETIA) 10 MG tablet Take 1 tablet (10 mg total) by mouth daily. 90 tablet 1   fenofibrate (TRICOR) 145 MG tablet Take 1 tablet (145 mg total) by  mouth every evening. 90 tablet 0   folic acid (FOLVITE) 1 MG tablet Take 1 mg by mouth daily.     hydrocortisone-pramoxine (PROCTOFOAM HC) rectal foam Place 1 applicator rectally 2 (two) times daily. 10 g 5   losartan (COZAAR) 50 MG tablet Take 1 tablet (50 mg total) by mouth daily. 90 tablet 1   magic mouthwash (nystatin, hydrocortisone, diphenhydrAMINE) suspension Swish and spit 5 mLs 3 (three) times daily as needed for mouth pain. 120 mL 0   metoCLOPramide (REGLAN) 10 MG tablet Take 1 tablet (10 mg total) by mouth every 8 (eight) hours as needed (hiccups). 30 tablet 0   RABEprazole (ACIPHEX) 20 MG tablet Take 1 tablet (20 mg total) by mouth 2 (two) times daily. 180 tablet 3   rivaroxaban (XARELTO)  20 MG TABS tablet Take 1 tablet (20 mg total) by mouth daily with supper. 90 tablet 1   temazepam (RESTORIL) 30 MG capsule Take 1 capsule (30 mg total) by mouth at bedtime as needed for sleep. 90 capsule 1    No results found for this or any previous visit (from the past 48 hour(s)). No results found.  ROS: Pain with rom of the right upper extremity  Physical Exam: Alert and oriented 71 y.o. male in no acute distress Cranial nerves 2-12 intact Cervical spine: full rom with no tenderness, nv intact distally Chest: active breath sounds bilaterally, no wheeze rhonchi or rales Heart: regular rate and rhythm, no murmur Abd: non tender non distended with active bowel sounds Hip is stable with rom  Right shoulder painful and weak rom  Nv intact distally No rashes or edema distally  Assessment/Plan Assessment: right shoulder cuff arthropathy  Plan:  Patient will undergo a right reverse shoulder by Dr. Veverly Fells at [facility] benefits and expectations were discussed with the patient. Patient understand risks, benefits and expectations and wishes to proceed. Preoperative templating of the joint replacement has been completed, documented, and submitted to the Operating Room personnel in order to  optimize intra-operative equipment management.   Merla Riches PA-C, MPAS [facility] is now [facility] [address] [place], [place], [place] [address] Phone: [phone] [email or web address]  [employer]

## 2021-05-29 NOTE — Patient Instructions (Addendum)
DUE TO COVID-19 ONLY TWO VISITORS  (aged 73 and older)  ARE ALLOWED TO COME WITH YOU AND STAY IN THE WAITING ROOM ONLY DURING PRE OP AND PROCEDURE.   **NO VISITORS ARE ALLOWED IN THE SHORT STAY AREA OR RECOVERY ROOM!!**  IF YOU WILL BE ADMITTED INTO THE HOSPITAL YOU ARE ALLOWED ONLY FOUR SUPPORT PEOPLE DURING VISITATION HOURS ONLY (7 AM -8PM)   The support person(s) must pass our screening, gel in and out, and wear a mask at all times, including in the patient's room. Patients must also wear a mask when staff or their support person are in the room. Visitors GUEST BADGE MUST BE WORN VISIBLY  One adult visitor may remain with you overnight and MUST be in the room by 8 P.M.     Your procedure is scheduled on: 06/05/21   Report to [facility] Main Entrance    Report to admitting at 12:20 PM   Call this number if you have problems the morning of surgery [phone]   Do not eat food :After Midnight.   After Midnight you may have the following liquids until 12:05 PM DAY OF SURGERY  Water Black Coffee (sugar ok, NO MILK/CREAM OR CREAMERS)  Tea (sugar ok, NO MILK/CREAM OR CREAMERS) regular and decaf                             Plain Jell-O (NO RED)                                           Fruit ices (not with fruit pulp, NO RED)                                     Popsicles (NO RED)                                                                  Juice: apple, WHITE grape, WHITE cranberry Sports drinks like Gatorade (NO RED) Clear broth(vegetable,chicken,beef)     The day of surgery:  Drink ONE (1) Pre-Surgery Clear Ensure at 12:05 PM the morning of surgery. Drink in one sitting. Do not sip.  This drink was given to you during your hospital  pre-op appointment visit. Nothing else to drink after completing the  Pre-Surgery Clear Ensure.          If you have questions, please contact your surgeon's office.   FOLLOW BOWEL PREP AND ANY ADDITIONAL PRE OP INSTRUCTIONS YOU  RECEIVED FROM YOUR SURGEON'S OFFICE!!!     Oral Hygiene is also important to reduce your risk of infection.                                    Remember - BRUSH YOUR TEETH THE MORNING OF SURGERY WITH YOUR REGULAR TOOTHPASTE   Take these medicines the morning of surgery with A SIP OF WATER: Tylenol, Zetia, Claritin, Reglan, Rabeprazole.  You may not have any metal on your body including jewelry, and body piercing             Do not wear lotions, powders, cologne, or deodorant              Men may shave face and neck.   Do not bring valuables to the hospital. [place].   Contacts, dentures or bridgework may not be worn into surgery.   Bring small overnight bag day of surgery.    Special Instructions: Bring a copy of your healthcare power of attorney and living will documents         the day of surgery if you haven't scanned them before.              Please read over the following fact sheets you were given: IF YOU HAVE QUESTIONS ABOUT YOUR PRE-OP INSTRUCTIONS PLEASE CALL [facility] - Preparing for Surgery Before surgery, you can play an important role.  Because skin is not sterile, your skin needs to be as free of germs as possible.  You can reduce the number of germs on your skin by washing with CHG (chlorahexidine gluconate) soap before surgery.  CHG is an antiseptic cleaner which kills germs and bonds with the skin to continue killing germs even after washing. Please DO NOT use if you have an allergy to CHG or antibacterial soaps.  If your skin becomes reddened/irritated stop using the CHG and inform your nurse when you arrive at Short Stay. Do not shave (including legs and underarms) for at least 48 hours prior to the first CHG shower.  You may shave your face/neck.  Please follow these instructions carefully:  1.  Shower with CHG Soap the night before surgery and the  morning of  surgery.  2.  If you choose to wash your hair, wash your hair first as usual with your normal  shampoo.  3.  After you shampoo, rinse your hair and body thoroughly to remove the shampoo.                             4.  Use CHG as you would any other liquid soap.  You can apply chg directly to the skin and wash.  Gently with a scrungie or clean washcloth.  5.  Apply the CHG Soap to your body ONLY FROM THE NECK DOWN.   Do   not use on face/ open                           Wound or open sores. Avoid contact with eyes, ears mouth and   genitals (private parts).                       Wash face,  Genitals (private parts) with your normal soap.             6.  Wash thoroughly, paying special attention to the area where your    surgery  will be performed.  7.  Thoroughly rinse your body with warm water from the neck down.  8.  DO NOT shower/wash with your normal soap after using and rinsing off the CHG Soap.  9.  Pat yourself dry with a clean towel.            10.  Wear clean pajamas.            11.  Place clean sheets on your bed the night of your first shower and do not  sleep with pets. Day of Surgery : Do not apply any lotions/deodorants the morning of surgery.  Please wear clean clothes to the hospital/surgery center.  FAILURE TO FOLLOW THESE INSTRUCTIONS MAY RESULT IN THE CANCELLATION OF YOUR SURGERY  PATIENT SIGNATURE_________________________________  NURSE SIGNATURE__________________________________  ________________________________________________________________________   Adam Phenix  An incentive spirometer is a tool that can help keep your lungs clear and active. This tool measures how well you are filling your lungs with each breath. Taking long deep breaths may help reverse or decrease the chance of developing breathing (pulmonary) problems (especially infection) following: A long period of time when you are unable to move or be active. BEFORE THE PROCEDURE   If the spirometer includes an indicator to show your best effort, your nurse or respiratory therapist will set it to a desired goal. If possible, sit up straight or lean slightly forward. Try not to slouch. Hold the incentive spirometer in an upright position. INSTRUCTIONS FOR USE  Sit on the edge of your bed if possible, or sit up as far as you can in bed or on a chair. Hold the incentive spirometer in an upright position. Breathe out normally. Place the mouthpiece in your mouth and seal your lips tightly around it. Breathe in slowly and as deeply as possible, raising the piston or the ball toward the top of the column. Hold your breath for 3-5 seconds or for as long as possible. Allow the piston or ball to fall to the bottom of the column. Remove the mouthpiece from your mouth and breathe out normally. Rest for a few seconds and repeat Steps 1 through 7 at least 10 times every 1-2 hours when you are awake. Take your time and take a few normal breaths between deep breaths. The spirometer may include an indicator to show your best effort. Use the indicator as a goal to work toward during each repetition. After each set of 10 deep breaths, practice coughing to be sure your lungs are clear. If you have an incision (the cut made at the time of surgery), support your incision when coughing by placing a pillow or rolled up towels firmly against it. Once you are able to get out of bed, walk around indoors and cough well. You may stop using the incentive spirometer when instructed by your caregiver.  RISKS AND COMPLICATIONS Take your time so you do not get dizzy or light-headed. If you are in pain, you may need to take or ask for pain medication before doing incentive spirometry. It is harder to take a deep breath if you are having pain. AFTER USE Rest and breathe slowly and easily. It can be helpful to keep track of a log of your progress. Your caregiver can provide you with a simple table to help  with this. If you are using the spirometer at home, follow these instructions: [place] IF:  You are having difficultly using the spirometer. You have trouble using the spirometer as often as instructed. Your pain medication is not giving enough relief while using the spirometer. You develop fever of 100.5 F (38.1 C) or higher. SEEK IMMEDIATE MEDICAL CARE IF:  You cough up bloody sputum that  had not been present before. You develop fever of 102 F (38.9 C) or greater. You develop worsening pain at or near the incision site. MAKE SURE YOU:  Understand these instructions. Will watch your condition. Will get help right away if you are not doing well or get worse. Document Released: 05/10/2006 Document Revised: 03/22/2011 Document Reviewed: 07/11/2006 ExitCare Patient Information 2014 [employer].   ________________________________________________________________________   [facility]- Preparing for Total Shoulder Arthroplasty    Before surgery, you can play an important role. Because skin is not sterile, your skin needs to be as free of germs as possible. You can reduce the number of germs on your skin by using the following products. Benzoyl Peroxide Gel Reduces the number of germs present on the skin Applied twice a day to shoulder area starting two days before surgery    ==================================================================  Please follow these instructions carefully:  BENZOYL PEROXIDE 5% GEL  Please do not use if you have an allergy to benzoyl peroxide.   If your skin becomes reddened/irritated stop using the benzoyl peroxide.  Starting two days before surgery, apply as follows: Apply benzoyl peroxide in the morning and at night. Apply after taking a shower. If you are not taking a shower clean entire shoulder front, back, and side along with the armpit with a clean wet washcloth.  Place a quarter-sized dollop on your shoulder and rub in  thoroughly, making sure to cover the front, back, and side of your shoulder, along with the armpit.   2 days before ____ AM   ____ PM              1 day before ____ AM   ____ PM                         Do this twice a day for two days.  (Last application is the night before surgery, AFTER using the CHG soap as described below).  Do NOT apply benzoyl peroxide gel on the day of surgery.

## 2021-05-29 NOTE — Progress Notes (Addendum)
COVID Vaccine Completed: no  Date of COVID positive in last 90 days: no  PCP - Alysia Penna, MD Cardiologist - Quay Burow, MD  Medical clearance by Alysia Penna 05/15/21 Epic  Chest x-ray - 01/17/21 Epic EKG - 01/16/21 Epic Stress Test - 02/08/13 Epic  ECHO - 02/14/20 Epic Cardiac Cath - 20 years ago Pacemaker/ICD device last checked: n/a Spinal Cord Stimulator: n/a  Bowel Prep - no  Sleep Study - n/a CPAP -   Fasting Blood Sugar - n/a Checks Blood Sugar _____ times a day  Blood Thinner Instructions: Xarelto, hold 5 days, Lovenox bridge  Aspirin Instructions: Last Dose: 05/31/21 0800  Activity level: Can go up a flight of stairs and perform activities of daily living without stopping and without symptoms of chest pain or shortness of breath.       Anesthesia review: DVT, HTN, PE, esophageal dysphagia, CKD  Patient denies shortness of breath, fever, cough and chest pain at PAT appointment   Patient verbalized understanding of instructions that were given to them at the PAT appointment. Patient was also instructed that they will need to review over the PAT instructions again at home before surgery.

## 2021-06-01 ENCOUNTER — Telehealth: Payer: Self-pay | Admitting: Family Medicine

## 2021-06-01 ENCOUNTER — Encounter (HOSPITAL_COMMUNITY)
Admission: RE | Admit: 2021-06-01 | Discharge: 2021-06-01 | Disposition: A | Discharge status: Home / Self Care | Payer: PPO | Source: Ambulatory Visit | Attending: Orthopedic Surgery | Admitting: Orthopedic Surgery

## 2021-06-01 ENCOUNTER — Encounter (HOSPITAL_COMMUNITY): Payer: Self-pay

## 2021-06-01 VITALS — BP 129/75 | HR 63 | Temp 98.0°F | Resp 14 | Ht 70.0 in | Wt 192.4 lb

## 2021-06-01 DIAGNOSIS — Z01818 Encounter for other preprocedural examination: Secondary | ICD-10-CM

## 2021-06-01 DIAGNOSIS — Z01812 Encounter for preprocedural laboratory examination: Secondary | ICD-10-CM | POA: Insufficient documentation

## 2021-06-01 DIAGNOSIS — I10 Essential (primary) hypertension: Secondary | ICD-10-CM | POA: Diagnosis not present

## 2021-06-01 HISTORY — DX: Unspecified osteoarthritis, unspecified site: M19.90

## 2021-06-01 LAB — CBC
HCT: 43.3 % (ref 39.0–52.0)
Hemoglobin: 15.2 g/dL (ref 13.0–17.0)
MCH: 30.7 pg (ref 26.0–34.0)
MCHC: 35.1 g/dL (ref 30.0–36.0)
MCV: 87.5 fL (ref 80.0–100.0)
Platelets: 217 10*3/uL (ref 150–400)
RBC: 4.95 MIL/uL (ref 4.22–5.81)
RDW: 13.4 % (ref 11.5–15.5)
WBC: 6 10*3/uL (ref 4.0–10.5)
nRBC: 0 % (ref 0.0–0.2)

## 2021-06-01 LAB — BASIC METABOLIC PANEL
Anion gap: 7 (ref 5–15)
BUN: 17 mg/dL (ref 8–23)
CO2: 26 mmol/L (ref 22–32)
Calcium: 9 mg/dL (ref 8.9–10.3)
Chloride: 106 mmol/L (ref 98–111)
Creatinine, Ser: 1.33 mg/dL — ABNORMAL HIGH (ref 0.61–1.24)
GFR, Estimated: 57 mL/min — ABNORMAL LOW (ref >60)
Glucose, Bld: 99 mg/dL (ref 70–99)
Potassium: 4.3 mmol/L (ref 3.5–5.1)
Sodium: 139 mmol/L (ref 135–145)

## 2021-06-01 LAB — SURGICAL PCR SCREEN
MRSA, PCR: NEGATIVE
Staphylococcus aureus: NEGATIVE

## 2021-06-01 NOTE — Telephone Encounter (Signed)
Pt prescribed  enoxaparin (LOVENOX) 80 MG/0.8ML injection   for pre-surgery. Wants to know if he should take the injection the same morning as the surgery (Friday 06/05/21 at 3p)

## 2021-06-01 NOTE — Telephone Encounter (Signed)
Called patient lvm advising patient to contact orthopedic office to speak with nurse to seek advisement as to when exactly  the Lovenox injection should be given prior to the surgery, their office protocol.

## 2021-06-02 NOTE — Progress Notes (Signed)
Anesthesia Chart Review   Case: [id number] Date/Time: 06/05/21 1449   Procedure: REVERSE SHOULDER ARTHROPLASTY (Right: Shoulder) - with ISB   Anesthesia type: General   Pre-op diagnosis: osteoarthritis of the shoulder region   Location: [facility] / [facility]   Surgeons: Netta Cedars, MD       DISCUSSION:70 y.o. 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 Netta Cedars.   Pt seen by PCP 05/15/2021 for preoperative evaluation.  Per OV note, "Preoperative clearance prior to a shoulder total arthroplasty. He is cleared for the surgery, but he will hold the Xarelto for 5 days prior to the surgery, and he will resume taking this the day after surgery. We will provide him with a Lovenox bridge during this interval period."  Anticipate pt can proceed with planned procedure barring acute status change.   VS: BP 129/75   Pulse 63   Temp 36.7 C (Oral)   Resp 14   Ht '5\' 10"'$  (1.778 m)   Wt 87.3 kg   SpO2 99%   BMI 27.61 kg/m   PROVIDERS: Laurey Morale, MD is PCP    LABS: {CHL AN LABS REVIEWED:112001::"Labs reviewed: Acceptable for surgery."} (all labs ordered are listed, but only abnormal results are displayed)  Labs Reviewed  BASIC METABOLIC PANEL - Abnormal; Notable for the following components:      Result Value   Creatinine, Ser 1.33 (*)    GFR, Estimated 57 (*)    All other components within normal limits  SURGICAL PCR SCREEN  CBC     IMAGES: VAS US Carotid 02/01/20 Summary:  Right Carotid: The extracranial vessels were near-normal with only minimal  wall                 thickening or plaque.   Left Carotid: The extracranial vessels were near-normal with only minimal  wall                thickening or plaque.   Vertebrals:  Bilateral vertebral arteries demonstrate antegrade flow.  Subclavians: Normal flow hemodynamics were seen in bilateral subclavian                arteries.   EKG: 01/16/2021 Rate 58 bpm  Sinus bradycardia Minimal voltage criteria for LVH, may be normal variant ( R in aVL ) Borderline ECG When compared with ECG of 19-Jul-2016 15:44, No significant change was found  CV: Echo 02/14/20 1. Left ventricular ejection fraction, by estimation, is 55 to 60%. Left  ventricular ejection fraction by 3D volume is 57 %. The left ventricle has  normal function. The left ventricle has no regional wall motion  abnormalities. Left ventricular diastolic   parameters are indeterminate.   2. Right ventricular systolic function is normal. The right ventricular  size is normal. There is normal pulmonary artery systolic pressure.   3. Left atrial size was mild to moderately dilated.   4. Right atrial size was mildly dilated.   5. The mitral valve is normal in structure. Trivial mitral valve  regurgitation. No evidence of mitral stenosis.   6. The aortic valve is tricuspid. There is mild calcification of the  aortic valve. Aortic valve regurgitation is not visualized. Mild aortic  valve sclerosis is present, with no evidence of aortic valve stenosis.   7. The inferior vena cava is normal in size with greater than 50%  respiratory variability, suggesting right atrial  pressure of 3 mmHg.  Past Medical History:  Diagnosis Date   Arthritis    Clotting disorder ([place])    Displacement of intervertebral disc, site unspecified, without myelopathy    Esophageal reflux    Headache(784.0)    sinus and migraines   Herpes simplex without mention of complication    Hiatal hernia    HTN (hypertension) 05/10/2012   Hyperlipemia    Hypertrophy (benign) of prostate    Intestinal infection due to other organism, not elsewhere classified    Kidney stones    sees Dr. Junious Silk   Personal history of colonic polyps    Personal history of urinary calculi    Personal history of venous thrombosis and embolism    Renal insufficiency     Past Surgical History:   Procedure Laterality Date   APPENDECTOMY     BOTOX INJECTION N/A 04/13/2012   Procedure: BOTOX INJECTION;  Surgeon: Milus Banister, MD;  Location: [facility];  Service: Endoscopy;  Laterality: N/A;   [place] and 1998   CHOLECYSTECTOMY     COLONOSCOPY  07/03/2015   per Dr. Ardis Hughs, single polyp removed but not retrieved, left sided diverticula, repeat in 5 yrs    ESOPHAGEAL MANOMETRY N/A 02/26/2013   Procedure: ESOPHAGEAL MANOMETRY (EM);  Surgeon: Milus Banister, MD;  Location: [facility];  Service: Endoscopy;  Laterality: N/A;   ESOPHAGEAL MANOMETRY N/A 11/03/2018   Procedure: ESOPHAGEAL MANOMETRY (EM);  Surgeon: Milus Banister, MD;  Location: [facility];  Service: Endoscopy;  Laterality: N/A;   ESOPHAGOGASTRODUODENOSCOPY  10/17/2018   per Dr. Ardis Hughs, small hiatal hernia only    ESOPHAGOGASTRODUODENOSCOPY (EGD) WITH ESOPHAGEAL DILATION N/A 04/13/2012   Procedure: ESOPHAGOGASTRODUODENOSCOPY (EGD) WITH ESOPHAGEAL DILATION;  Surgeon: Milus Banister, MD;  Location: [facility];  Service: Endoscopy;  Laterality: N/A;  possible botox injection   EXTRACORPOREAL SHOCK WAVE LITHOTRIPSY Left 08/14/2020   Procedure: LEFT EXTRACORPOREAL SHOCK WAVE LITHOTRIPSY (ESWL);  Surgeon: [name], MD;  [facility];  Service: Urology;  Laterality: Left;   FINGER SURGERY     3rd finger trigger bilateral    GALLBLADDER SURGERY     LITHOTRIPSY     ROTATOR CUFF REPAIR Right 01-10-12   per Dr. Esmond Plants, also repair torn biceps tendon    STERIOD INJECTION Left 07/27/2013   Procedure: STEROID INJECTION;  Surgeon: Augustin Schooling, MD;  Location: [address];  Service: Orthopedics;  Laterality: Left;   TONSILLECTOMY     TRIGGER FINGER RELEASE Right 07/27/2013   Procedure: RIGHT RING FINGER A-1 RELEASE;  Surgeon: Augustin Schooling, MD;  Location: [facility];  Service: Orthopedics;  Laterality: Right;   URETERAL STENT PLACEMENT  July 2012   left ureter, per Dr. Junious Silk,  for a stone     MEDICATIONS:  acetaminophen (TYLENOL) 650 MG CR tablet   Ascorbic Acid (VITAMIN C PO)   Ascorbic Acid (VITAMIN C) 1000 MG tablet   Cholecalciferol (VITAMIN D) 50 MCG (2000 UT) tablet   clobetasol cream (TEMOVATE) 0.05 %   enoxaparin (LOVENOX) 80 MG/0.8ML injection   ezetimibe (ZETIA) 10 MG tablet   fenofibrate (TRICOR) 145 MG tablet   fluticasone (FLONASE) 50 MCG/ACT nasal spray   folic acid (FOLVITE) 683 MCG tablet   loratadine (CLARITIN) 10 MG tablet   losartan (COZAAR) 50 MG tablet   magic mouthwash (nystatin, hydrocortisone, diphenhydrAMINE) suspension   metoCLOPramide (REGLAN) 10 MG tablet   Multiple Vitamin (MULTIVITAMIN WITH MINERALS) TABS tablet  RABEprazole (ACIPHEX) 20 MG tablet   rivaroxaban (XARELTO) 20 MG TABS tablet   temazepam (RESTORIL) 30 MG capsule   zinc gluconate 50 MG tablet   No current facility-administered medications for this encounter.

## 2021-06-02 NOTE — Telephone Encounter (Signed)
Patient is scheduled for right shoulder surgery on 06/05/21.    Patient is currently taking Lovenox injection  when, should the patient take the injection prior to his surgery?   Please advise

## 2021-06-02 NOTE — Telephone Encounter (Addendum)
Spoke with patient about below message from Dr. Sarajane Jews.    Patient informed me that he reached out to Ortho office and was informed of same message as to when injection should be given. Voiced understanding, nothing further is needed.

## 2021-06-02 NOTE — Telephone Encounter (Signed)
He should take the last Lovenox dose the night before surgery. Do NOT take it the morning of surgery

## 2021-06-03 NOTE — Anesthesia Preprocedure Evaluation (Addendum)
Anesthesia Evaluation  Patient identified by MRN, date of birth, ID band Patient awake    Reviewed: Allergy & Precautions, NPO status , Patient's Chart, lab work & pertinent test results  Airway Mallampati: II  TM Distance: >3 FB Neck ROM: Full    Dental  (+) Lower Dentures, Upper Dentures   Pulmonary neg pulmonary ROS, former smoker,    Pulmonary exam normal breath sounds clear to auscultation       Cardiovascular hypertension, Pt. on medications + DVT (on xarelto; bridged with lovenox...last dose 06/04/21)  Normal cardiovascular exam Rhythm:Regular Rate:Normal  H/o PE  Sinus bradycardia Minimal voltage criteria for LVH, may be normal variant ( R in aVL ) Borderline ECG When compared with ECG of 19-Jul-2016 15:44, No significant change was found Confirmed by Addison Lank [phone]) on 01/17/2021 3:22:28 AM    Neuro/Psych  Headaches,  Neuromuscular disease (trigeminal neuralgia) negative psych ROS   GI/Hepatic Neg liver ROS, hiatal hernia, GERD  ,  Endo/Other  negative endocrine ROS  Renal/GU Renal InsufficiencyRenal disease (kidney stones)  negative genitourinary   Musculoskeletal  (+) Arthritis , Osteoarthritis,    Abdominal   Peds negative pediatric ROS (+)  Hematology negative hematology ROS (+)   Anesthesia Other Findings   Reproductive/Obstetrics negative OB ROS                           Anesthesia Physical Anesthesia Plan  ASA: 3  Anesthesia Plan: General and Regional   Post-op Pain Management:    Induction: Intravenous  PONV Risk Score and Plan: 2 and Treatment may vary due to age or medical condition, Ondansetron and Dexamethasone  Airway Management Planned: Oral ETT  Additional Equipment: None  Intra-op Plan:   Post-operative Plan: Extubation in OR  Informed Consent: I have reviewed the patients History and Physical, chart, labs and discussed the procedure  including the risks, benefits and alternatives for the proposed anesthesia with the patient or authorized representative who has indicated his/her understanding and acceptance.     Dental advisory given  Plan Discussed with: CRNA, Anesthesiologist and Surgeon  Anesthesia Plan Comments: (See PAT note 06/01/2021)      Anesthesia Quick Evaluation

## 2021-06-05 ENCOUNTER — Ambulatory Visit (HOSPITAL_COMMUNITY): Payer: PPO | Admitting: Physician Assistant

## 2021-06-05 ENCOUNTER — Observation Stay (HOSPITAL_COMMUNITY)
Admission: RE | Admit: 2021-06-05 | Discharge: 2021-06-06 | Disposition: A | Discharge status: Home / Self Care | Payer: PPO | Attending: Orthopedic Surgery | Admitting: Orthopedic Surgery

## 2021-06-05 ENCOUNTER — Encounter (HOSPITAL_COMMUNITY): Payer: Self-pay | Admitting: Orthopedic Surgery

## 2021-06-05 ENCOUNTER — Observation Stay (HOSPITAL_COMMUNITY): Payer: PPO

## 2021-06-05 ENCOUNTER — Ambulatory Visit (HOSPITAL_BASED_OUTPATIENT_CLINIC_OR_DEPARTMENT_OTHER): Payer: PPO | Admitting: Anesthesiology

## 2021-06-05 ENCOUNTER — Other Ambulatory Visit: Payer: Self-pay

## 2021-06-05 ENCOUNTER — Encounter (HOSPITAL_COMMUNITY)
Admission: RE | Disposition: A | Discharge status: Home / Self Care | Payer: Self-pay | Source: Home / Self Care | Attending: Orthopedic Surgery

## 2021-06-05 DIAGNOSIS — I10 Essential (primary) hypertension: Secondary | ICD-10-CM | POA: Diagnosis not present

## 2021-06-05 DIAGNOSIS — M19011 Primary osteoarthritis, right shoulder: Secondary | ICD-10-CM | POA: Diagnosis not present

## 2021-06-05 DIAGNOSIS — M6289 Other specified disorders of muscle: Secondary | ICD-10-CM

## 2021-06-05 DIAGNOSIS — Z87891 Personal history of nicotine dependence: Secondary | ICD-10-CM | POA: Diagnosis not present

## 2021-06-05 DIAGNOSIS — Z7901 Long term (current) use of anticoagulants: Secondary | ICD-10-CM | POA: Insufficient documentation

## 2021-06-05 DIAGNOSIS — Z79899 Other long term (current) drug therapy: Secondary | ICD-10-CM | POA: Diagnosis not present

## 2021-06-05 DIAGNOSIS — Z96611 Presence of right artificial shoulder joint: Secondary | ICD-10-CM

## 2021-06-05 DIAGNOSIS — G8918 Other acute postprocedural pain: Secondary | ICD-10-CM | POA: Diagnosis not present

## 2021-06-05 DIAGNOSIS — M75101 Unspecified rotator cuff tear or rupture of right shoulder, not specified as traumatic: Secondary | ICD-10-CM | POA: Diagnosis not present

## 2021-06-05 DIAGNOSIS — K449 Diaphragmatic hernia without obstruction or gangrene: Secondary | ICD-10-CM

## 2021-06-05 HISTORY — PX: REVERSE SHOULDER ARTHROPLASTY: SHX5054

## 2021-06-05 SURGERY — ARTHROPLASTY, SHOULDER, TOTAL, REVERSE
Anesthesia: Regional | Site: Shoulder | Laterality: Right

## 2021-06-05 MED ORDER — FOLIC ACID 1 MG PO TABS
1.0000 mg | ORAL_TABLET | Freq: Every day | ORAL | Status: DC
  Administered 2021-06-06: 1 mg via ORAL
  Filled 2021-06-05: qty 1

## 2021-06-05 MED ORDER — PANTOPRAZOLE SODIUM 40 MG PO TBEC
40.0000 mg | DELAYED_RELEASE_TABLET | Freq: Every day | ORAL | Status: DC
  Administered 2021-06-05 – 2021-06-06 (×2): 40 mg via ORAL
  Filled 2021-06-05 (×2): qty 1

## 2021-06-05 MED ORDER — METOCLOPRAMIDE HCL 5 MG PO TABS: 5.0000 mg | ORAL_TABLET | Freq: Three times a day (TID) | ORAL | Status: DC | PRN

## 2021-06-05 MED ORDER — SODIUM CHLORIDE 0.9 % IV SOLN: INTRAVENOUS | Status: DC

## 2021-06-05 MED ORDER — ACETAMINOPHEN ER 650 MG PO TBCR: 650.0000 mg | EXTENDED_RELEASE_TABLET | Freq: Three times a day (TID) | ORAL | Status: DC | PRN

## 2021-06-05 MED ORDER — VITAMIN D3 25 MCG (1000 UNIT) PO TABS
2000.0000 [IU] | ORAL_TABLET | Freq: Every day | ORAL | Status: DC
  Administered 2021-06-06: 2000 [IU] via ORAL
  Filled 2021-06-05 (×2): qty 2

## 2021-06-05 MED ORDER — OXYCODONE HCL 5 MG PO TABS
5.0000 mg | ORAL_TABLET | ORAL | Status: DC | PRN
  Administered 2021-06-06: 10 mg via ORAL
  Filled 2021-06-05: qty 2

## 2021-06-05 MED ORDER — BUPIVACAINE-EPINEPHRINE (PF) 0.25% -1:200000 IJ SOLN
INTRAMUSCULAR | Status: DC | PRN
  Administered 2021-06-05: 14 mL

## 2021-06-05 MED ORDER — SUGAMMADEX SODIUM 200 MG/2ML IV SOLN
INTRAVENOUS | Status: DC | PRN
  Administered 2021-06-05: 200 mg via INTRAVENOUS

## 2021-06-05 MED ORDER — PHENYLEPHRINE HCL-NACL 20-0.9 MG/250ML-% IV SOLN
INTRAVENOUS | Status: DC | PRN
  Administered 2021-06-05: 40 ug/min via INTRAVENOUS

## 2021-06-05 MED ORDER — ACETAMINOPHEN 325 MG PO TABS: 325.0000 mg | ORAL_TABLET | Freq: Four times a day (QID) | ORAL | Status: DC | PRN

## 2021-06-05 MED ORDER — TEMAZEPAM 15 MG PO CAPS: 30.0000 mg | ORAL_CAPSULE | Freq: Every evening | ORAL | Status: DC | PRN

## 2021-06-05 MED ORDER — LIDOCAINE HCL (PF) 2 % IJ SOLN
INTRAMUSCULAR | Status: AC
  Filled 2021-06-05: qty 5

## 2021-06-05 MED ORDER — CEFAZOLIN SODIUM-DEXTROSE 2-4 GM/100ML-% IV SOLN
2.0000 g | Freq: Four times a day (QID) | INTRAVENOUS | Status: AC
  Administered 2021-06-05 – 2021-06-06 (×3): 2 g via INTRAVENOUS
  Filled 2021-06-05 (×3): qty 100

## 2021-06-05 MED ORDER — METHOCARBAMOL 500 MG IVPB - SIMPLE MED
INTRAVENOUS | Status: AC
  Filled 2021-06-05: qty 50

## 2021-06-05 MED ORDER — LOSARTAN POTASSIUM 50 MG PO TABS
50.0000 mg | ORAL_TABLET | Freq: Every day | ORAL | Status: DC
  Administered 2021-06-06: 50 mg via ORAL
  Filled 2021-06-05: qty 1

## 2021-06-05 MED ORDER — ENOXAPARIN SODIUM 80 MG/0.8ML IJ SOSY
80.0000 mg | PREFILLED_SYRINGE | Freq: Two times a day (BID) | INTRAMUSCULAR | Status: DC
  Administered 2021-06-06: 80 mg via SUBCUTANEOUS
  Filled 2021-06-05: qty 0.8

## 2021-06-05 MED ORDER — FENTANYL CITRATE PF 50 MCG/ML IJ SOSY
25.0000 ug | PREFILLED_SYRINGE | INTRAMUSCULAR | Status: DC | PRN
  Administered 2021-06-05 (×2): 50 ug via INTRAVENOUS

## 2021-06-05 MED ORDER — METOCLOPRAMIDE HCL 5 MG PO TABS: 10.0000 mg | ORAL_TABLET | Freq: Three times a day (TID) | ORAL | Status: DC | PRN

## 2021-06-05 MED ORDER — MAGIC MOUTHWASH: 5.0000 mL | Freq: Three times a day (TID) | ORAL | Status: DC | PRN

## 2021-06-05 MED ORDER — ASCORBIC ACID 500 MG PO TABS
1000.0000 mg | ORAL_TABLET | Freq: Every day | ORAL | Status: DC
Start: 2021-06-05 — End: 2021-06-05

## 2021-06-05 MED ORDER — METHOCARBAMOL 500 MG PO TABS: 500.0000 mg | ORAL_TABLET | Freq: Four times a day (QID) | ORAL | Status: DC | PRN

## 2021-06-05 MED ORDER — ONDANSETRON HCL 4 MG/2ML IJ SOLN: 4.0000 mg | Freq: Once | INTRAMUSCULAR | Status: DC | PRN

## 2021-06-05 MED ORDER — METOCLOPRAMIDE HCL 5 MG/ML IJ SOLN: 5.0000 mg | Freq: Three times a day (TID) | INTRAMUSCULAR | Status: DC | PRN

## 2021-06-05 MED ORDER — ONDANSETRON HCL 4 MG/2ML IJ SOLN: 4.0000 mg | Freq: Four times a day (QID) | INTRAMUSCULAR | Status: DC | PRN

## 2021-06-05 MED ORDER — METHOCARBAMOL 500 MG IVPB - SIMPLE MED
500.0000 mg | Freq: Four times a day (QID) | INTRAVENOUS | Status: DC | PRN
  Administered 2021-06-05: 500 mg via INTRAVENOUS

## 2021-06-05 MED ORDER — ROCURONIUM BROMIDE 10 MG/ML (PF) SYRINGE
PREFILLED_SYRINGE | INTRAVENOUS | Status: DC | PRN
  Administered 2021-06-05: 70 mg via INTRAVENOUS

## 2021-06-05 MED ORDER — POLYETHYLENE GLYCOL 3350 17 G PO PACK: 17.0000 g | PACK | Freq: Every day | ORAL | Status: DC | PRN

## 2021-06-05 MED ORDER — ONDANSETRON HCL 4 MG/2ML IJ SOLN
INTRAMUSCULAR | Status: DC | PRN
  Administered 2021-06-05: 4 mg via INTRAVENOUS

## 2021-06-05 MED ORDER — BUPIVACAINE LIPOSOME 1.3 % IJ SUSP
INTRAMUSCULAR | Status: DC | PRN
  Administered 2021-06-05: 10 mL via PERINEURAL

## 2021-06-05 MED ORDER — PROPOFOL 10 MG/ML IV BOLUS: INTRAVENOUS | Status: DC | PRN

## 2021-06-05 MED ORDER — STERILE WATER FOR IRRIGATION IR SOLN
Status: DC | PRN
  Administered 2021-06-05: 2000 mL

## 2021-06-05 MED ORDER — ORAL CARE MOUTH RINSE: 15.0000 mL | Freq: Once | OROMUCOSAL | Status: AC

## 2021-06-05 MED ORDER — FENTANYL CITRATE PF 50 MCG/ML IJ SOSY
PREFILLED_SYRINGE | INTRAMUSCULAR | Status: AC
  Filled 2021-06-05: qty 1

## 2021-06-05 MED ORDER — CEFAZOLIN SODIUM-DEXTROSE 2-4 GM/100ML-% IV SOLN
2.0000 g | INTRAVENOUS | Status: AC
  Administered 2021-06-05: 2 g via INTRAVENOUS
  Filled 2021-06-05: qty 100

## 2021-06-05 MED ORDER — NYSTATIN 100000 UNIT/ML MT SUSP: 5.0000 mL | Freq: Three times a day (TID) | OROMUCOSAL | Status: DC | PRN

## 2021-06-05 MED ORDER — AMISULPRIDE (ANTIEMETIC) 5 MG/2ML IV SOLN: 10.0000 mg | Freq: Once | INTRAVENOUS | Status: DC | PRN

## 2021-06-05 MED ORDER — MENTHOL 3 MG MT LOZG: 1.0000 | LOZENGE | OROMUCOSAL | Status: DC | PRN

## 2021-06-05 MED ORDER — EZETIMIBE 10 MG PO TABS
10.0000 mg | ORAL_TABLET | Freq: Every day | ORAL | Status: DC
  Administered 2021-06-06: 10 mg via ORAL
  Filled 2021-06-05: qty 1

## 2021-06-05 MED ORDER — METHOCARBAMOL 500 MG PO TABS
500.0000 mg | ORAL_TABLET | Freq: Four times a day (QID) | ORAL | 1 refills | Status: AC | PRN
Start: 2021-06-05 — End: ?

## 2021-06-05 MED ORDER — ZINC SULFATE 220 (50 ZN) MG PO CAPS
220.0000 mg | ORAL_CAPSULE | Freq: Every day | ORAL | Status: DC
  Administered 2021-06-06: 220 mg via ORAL
  Filled 2021-06-05: qty 1

## 2021-06-05 MED ORDER — ONDANSETRON HCL 4 MG PO TABS: 4.0000 mg | ORAL_TABLET | Freq: Four times a day (QID) | ORAL | Status: DC | PRN

## 2021-06-05 MED ORDER — BUPIVACAINE HCL (PF) 0.5 % IJ SOLN
INTRAMUSCULAR | Status: DC | PRN
  Administered 2021-06-05: 15 mL via PERINEURAL

## 2021-06-05 MED ORDER — LACTATED RINGERS IV SOLN: INTRAVENOUS | Status: DC

## 2021-06-05 MED ORDER — DEXAMETHASONE SODIUM PHOSPHATE 10 MG/ML IJ SOLN
INTRAMUSCULAR | Status: DC | PRN
  Administered 2021-06-05: 10 mg via INTRAVENOUS

## 2021-06-05 MED ORDER — DEXAMETHASONE SODIUM PHOSPHATE 10 MG/ML IJ SOLN
INTRAMUSCULAR | Status: AC
  Filled 2021-06-05: qty 1

## 2021-06-05 MED ORDER — FLUTICASONE PROPIONATE 50 MCG/ACT NA SUSP: 1.0000 | Freq: Every day | NASAL | Status: DC | PRN

## 2021-06-05 MED ORDER — FENOFIBRATE 160 MG PO TABS
160.0000 mg | ORAL_TABLET | Freq: Every day | ORAL | Status: DC
  Administered 2021-06-06: 160 mg via ORAL
  Filled 2021-06-05: qty 1

## 2021-06-05 MED ORDER — LIDOCAINE HCL (CARDIAC) PF 100 MG/5ML IV SOSY
PREFILLED_SYRINGE | INTRAVENOUS | Status: DC | PRN
  Administered 2021-06-05: 100 mg via INTRAVENOUS

## 2021-06-05 MED ORDER — BUPIVACAINE-EPINEPHRINE (PF) 0.25% -1:200000 IJ SOLN
INTRAMUSCULAR | Status: AC
  Filled 2021-06-05: qty 30

## 2021-06-05 MED ORDER — FENTANYL CITRATE PF 50 MCG/ML IJ SOSY
50.0000 ug | PREFILLED_SYRINGE | INTRAMUSCULAR | Status: AC
  Filled 2021-06-05: qty 2

## 2021-06-05 MED ORDER — ONDANSETRON HCL 4 MG/2ML IJ SOLN
INTRAMUSCULAR | Status: AC
  Filled 2021-06-05: qty 2

## 2021-06-05 MED ORDER — OXYCODONE HCL 5 MG/5ML PO SOLN: 5.0000 mg | Freq: Once | ORAL | Status: DC | PRN

## 2021-06-05 MED ORDER — OXYCODONE HCL 5 MG PO TABS: 5.0000 mg | ORAL_TABLET | Freq: Once | ORAL | Status: DC | PRN

## 2021-06-05 MED ORDER — ACETAMINOPHEN 500 MG PO TABS
1000.0000 mg | ORAL_TABLET | Freq: Once | ORAL | Status: AC
  Administered 2021-06-05: 1000 mg via ORAL
  Filled 2021-06-05: qty 2

## 2021-06-05 MED ORDER — BISACODYL 10 MG RE SUPP: 10.0000 mg | Freq: Every day | RECTAL | Status: DC | PRN

## 2021-06-05 MED ORDER — CHLORHEXIDINE GLUCONATE 0.12 % MT SOLN
15.0000 mL | Freq: Once | OROMUCOSAL | Status: AC
  Administered 2021-06-05: 15 mL via OROMUCOSAL

## 2021-06-05 MED ORDER — PHENYLEPHRINE 80 MCG/ML (10ML) SYRINGE FOR IV PUSH (FOR BLOOD PRESSURE SUPPORT)
PREFILLED_SYRINGE | INTRAVENOUS | Status: DC | PRN
Start: 2021-06-05 — End: 2021-06-05
  Administered 2021-06-05: 120 ug via INTRAVENOUS

## 2021-06-05 MED ORDER — DOCUSATE SODIUM 100 MG PO CAPS
100.0000 mg | ORAL_CAPSULE | Freq: Two times a day (BID) | ORAL | Status: DC
  Administered 2021-06-06: 100 mg via ORAL
  Filled 2021-06-05: qty 1

## 2021-06-05 MED ORDER — ROCURONIUM BROMIDE 10 MG/ML (PF) SYRINGE
PREFILLED_SYRINGE | INTRAVENOUS | Status: AC
  Filled 2021-06-05: qty 10

## 2021-06-05 MED ORDER — PHENOL 1.4 % MT LIQD: 1.0000 | OROMUCOSAL | Status: DC | PRN

## 2021-06-05 MED ORDER — CLOBETASOL PROPIONATE 0.05 % EX CREA: 1.0000 "application " | TOPICAL_CREAM | Freq: Two times a day (BID) | CUTANEOUS | Status: DC | PRN

## 2021-06-05 MED ORDER — HYDROMORPHONE HCL 1 MG/ML IJ SOLN: 0.5000 mg | INTRAMUSCULAR | Status: DC | PRN

## 2021-06-05 MED ORDER — LORATADINE 10 MG PO TABS: 10.0000 mg | ORAL_TABLET | Freq: Every day | ORAL | Status: DC | PRN

## 2021-06-05 MED ORDER — PROPOFOL 10 MG/ML IV BOLUS
INTRAVENOUS | Status: AC
  Filled 2021-06-05: qty 20

## 2021-06-05 MED ORDER — ADULT MULTIVITAMIN W/MINERALS CH
1.0000 | ORAL_TABLET | Freq: Every day | ORAL | Status: DC
  Administered 2021-06-06: 1 via ORAL
  Filled 2021-06-05: qty 1

## 2021-06-05 MED ORDER — OXYCODONE-ACETAMINOPHEN 5-325 MG PO TABS: 1.0000 | ORAL_TABLET | ORAL | 0 refills | Status: AC | PRN

## 2021-06-05 MED ORDER — PROPOFOL 10 MG/ML IV BOLUS
INTRAVENOUS | Status: DC | PRN
  Administered 2021-06-05: 150 mg via INTRAVENOUS

## 2021-06-05 MED ORDER — ASCORBIC ACID 500 MG PO TABS
1000.0000 mg | ORAL_TABLET | Freq: Every day | ORAL | Status: DC
  Administered 2021-06-06: 1000 mg via ORAL
  Filled 2021-06-05: qty 2

## 2021-06-05 MED ORDER — ONDANSETRON HCL 4 MG PO TABS: 4.0000 mg | ORAL_TABLET | Freq: Three times a day (TID) | ORAL | 1 refills | Status: AC | PRN

## 2021-06-05 MED ORDER — FENTANYL CITRATE PF 50 MCG/ML IJ SOSY
PREFILLED_SYRINGE | INTRAMUSCULAR | Status: AC
  Administered 2021-06-05: 50 ug via INTRAVENOUS
  Filled 2021-06-05: qty 2

## 2021-06-05 SURGICAL SUPPLY — 76 items
BAG COUNTER SPONGE SURGICOUNT (BAG) ×1 IMPLANT
BAG ZIPLOCK 12X15 (MISCELLANEOUS) IMPLANT
BIT DRILL 1.6MX128 (BIT) ×1 IMPLANT
BIT DRILL 170X2.5X (BIT) IMPLANT
BIT DRL 170X2.5X (BIT) ×1
BLADE SAG 18X100X1.27 (BLADE) ×2 IMPLANT
CLSR STERI-STRIP ANTIMIC 1/2X4 (GAUZE/BANDAGES/DRESSINGS) ×1 IMPLANT
COVER BACK TABLE 60X90IN (DRAPES) ×2 IMPLANT
COVER SURGICAL LIGHT HANDLE (MISCELLANEOUS) ×2 IMPLANT
CUP HUMERAL 42 PLUS 3 (Orthopedic Implant) ×1 IMPLANT
DRAPE INCISE IOBAN 66X45 STRL (DRAPES) ×2 IMPLANT
DRAPE ORTHO SPLIT 77X108 STRL (DRAPES) ×4
DRAPE SHEET LG 3/4 BI-LAMINATE (DRAPES) ×2 IMPLANT
DRAPE SURG ORHT 6 SPLT 77X108 (DRAPES) ×2 IMPLANT
DRAPE TOP 10253 STERILE (DRAPES) ×2 IMPLANT
DRAPE U-SHAPE 47X51 STRL (DRAPES) ×2 IMPLANT
DRILL 2.5 (BIT) ×2
DRSG ADAPTIC 3X8 NADH LF (GAUZE/BANDAGES/DRESSINGS) ×2 IMPLANT
DRSG PAD ABDOMINAL 8X10 ST (GAUZE/BANDAGES/DRESSINGS) ×2 IMPLANT
DURAPREP 26ML APPLICATOR (WOUND CARE) ×2 IMPLANT
ELECT BLADE TIP CTD 4 INCH (ELECTRODE) ×2 IMPLANT
ELECT NDL TIP 2.8 STRL (NEEDLE) ×1 IMPLANT
ELECT NEEDLE TIP 2.8 STRL (NEEDLE) ×2 IMPLANT
ELECT REM PT RETURN 15FT ADLT (MISCELLANEOUS) ×2 IMPLANT
EPIPHYSI RIGHT SZ 2 (Shoulder) ×2 IMPLANT
EPIPHYSIS RIGHT SZ 2 (Shoulder) IMPLANT
FACESHIELD WRAPAROUND (MASK) ×2 IMPLANT
FACESHIELD WRAPAROUND OR TEAM (MASK) ×1 IMPLANT
GAUZE SPONGE 4X4 12PLY STRL (GAUZE/BANDAGES/DRESSINGS) ×2 IMPLANT
GLENOSPHERE XTEND LAT 42+0 STD (Miscellaneous) ×1 IMPLANT
GLOVE BIOGEL PI IND STRL 7.5 (GLOVE) ×1 IMPLANT
GLOVE BIOGEL PI IND STRL 8.5 (GLOVE) ×1 IMPLANT
GLOVE BIOGEL PI INDICATOR 7.5 (GLOVE) ×1
GLOVE BIOGEL PI INDICATOR 8.5 (GLOVE) ×1
GLOVE ORTHO TXT STRL SZ7.5 (GLOVE) ×2 IMPLANT
GLOVE SURG ORTHO 8.5 STRL (GLOVE) ×2 IMPLANT
GOWN STRL REUS W/ TWL XL LVL3 (GOWN DISPOSABLE) ×2 IMPLANT
GOWN STRL REUS W/TWL XL LVL3 (GOWN DISPOSABLE) ×4
KIT BASIN OR (CUSTOM PROCEDURE TRAY) ×2 IMPLANT
KIT TURNOVER KIT A (KITS) ×1 IMPLANT
MANIFOLD NEPTUNE II (INSTRUMENTS) ×2 IMPLANT
METAGLENE DELTA EXTEND (Trauma) IMPLANT
METAGLENE DXTEND (Trauma) ×2 IMPLANT
NDL MAYO 6 CRC TAPER PT (NEEDLE) IMPLANT
NDL MAYO CATGUT SZ4 TPR NDL (NEEDLE) ×1 IMPLANT
NEEDLE MAYO 6 CRC TAPER PT (NEEDLE) ×2 IMPLANT
NEEDLE MAYO CATGUT SZ4 (NEEDLE) ×2 IMPLANT
NS IRRIG 1000ML POUR BTL (IV SOLUTION) ×2 IMPLANT
PACK SHOULDER (CUSTOM PROCEDURE TRAY) ×2 IMPLANT
PIN GUIDE 1.2 (PIN) ×1 IMPLANT
PIN GUIDE GLENOPHERE 1.5MX300M (PIN) ×1 IMPLANT
PIN METAGLENE 2.5 (PIN) ×1 IMPLANT
PROTECTOR NERVE ULNAR (MISCELLANEOUS) ×2 IMPLANT
RESTRAINT HEAD UNIVERSAL NS (MISCELLANEOUS) ×2 IMPLANT
SCREW 4.5X24MM (Screw) ×2 IMPLANT
SCREW 4.5X36MM (Screw) ×1 IMPLANT
SCREW BN 24X4.5XLCK STRL (Screw) IMPLANT
SCREW LOCK 42 (Screw) ×1 IMPLANT
SLING ARM FOAM STRAP LRG (SOFTGOODS) ×1 IMPLANT
SMARTMIX MINI TOWER (MISCELLANEOUS)
SPIKE FLUID TRANSFER (MISCELLANEOUS) ×2 IMPLANT
SPONGE T-LAP 4X18 ~~LOC~~+RFID (SPONGE) ×2 IMPLANT
STEM 12 HA (Stem) ×1 IMPLANT
STRIP CLOSURE SKIN 1/2X4 (GAUZE/BANDAGES/DRESSINGS) ×2 IMPLANT
SUCTION FRAZIER HANDLE 10FR (MISCELLANEOUS) ×2
SUCTION TUBE FRAZIER 10FR DISP (MISCELLANEOUS) ×1 IMPLANT
SUT FIBERWIRE #2 38 T-5 BLUE (SUTURE) ×8
SUT MNCRL AB 4-0 PS2 18 (SUTURE) ×2 IMPLANT
SUT VIC AB 0 CT1 36 (SUTURE) ×4 IMPLANT
SUT VIC AB 0 CT2 27 (SUTURE) ×2 IMPLANT
SUT VIC AB 2-0 CT1 27 (SUTURE) ×2
SUT VIC AB 2-0 CT1 TAPERPNT 27 (SUTURE) ×1 IMPLANT
SUTURE FIBERWR #2 38 T-5 BLUE (SUTURE) ×2 IMPLANT
TOWEL OR 17X26 10 PK STRL BLUE (TOWEL DISPOSABLE) ×2 IMPLANT
TOWER SMARTMIX MINI (MISCELLANEOUS) IMPLANT
TUBE SUCTION HIGH CAP CLEAR NV (SUCTIONS) ×1 IMPLANT

## 2021-06-05 NOTE — Anesthesia Postprocedure Evaluation (Signed)
Anesthesia Post Note  Patient: Steven [name]  Procedure(s) Performed: REVERSE SHOULDER ARTHROPLASTY (Right: Shoulder)     Patient location during evaluation: PACU Anesthesia Type: Regional and General Level of consciousness: awake Pain management: pain level controlled Vital Signs Assessment: post-procedure vital signs reviewed and stable Respiratory status: spontaneous breathing and respiratory function stable Cardiovascular status: stable Postop Assessment: no apparent nausea or vomiting Anesthetic complications: no   No notable events documented.  Last Vitals:  Vitals:   06/05/21 1645 06/05/21 1700  BP: 129/72 129/74  Pulse: 67 69  Resp: 20 19  Temp:    SpO2: 100% 93%    Last Pain:  Vitals:   06/05/21 1700  TempSrc:   PainSc: 4                  Candra R Anders Hohmann

## 2021-06-05 NOTE — Plan of Care (Signed)
  Problem: Clinical Measurements: Goal: Ability to maintain clinical measurements within normal limits will improve Outcome: Progressing   Problem: Activity: Goal: Risk for activity intolerance will decrease Outcome: Progressing   Problem: Pain Managment: Goal: General experience of comfort will improve Outcome: Progressing   Problem: Safety: Goal: Ability to remain free from injury will improve Outcome: Progressing   

## 2021-06-05 NOTE — Anesthesia Procedure Notes (Signed)
Anesthesia Regional Block: Interscalene brachial plexus block   Pre-Anesthetic Checklist: , timeout performed,  Correct Patient, Correct Site, Correct Laterality,  Correct Procedure, Correct Position, site marked,  Risks and benefits discussed,  Surgical consent,  Pre-op evaluation,  At surgeon's request and post-op pain management  Laterality: Right  Prep: chloraprep       Needles:  Injection technique: Single-shot  Needle Type: Echogenic Stimulator Needle     Needle Length: 10cm  Needle Gauge: 20     Additional Needles:   Procedures:,,,, ultrasound used (permanent image in chart),,    Narrative:  Start time: 06/05/2021 12:55 PM End time: 06/05/2021 1:00 PM Injection made incrementally with aspirations every 5 mL.  Performed by: Personally  Anesthesiologist: Merlinda Frederick, MD  Additional Notes: Standard monitors applied. Skin prepped. Good needle visualization with ultrasound. Injection made in 5cc increments with no resistance to injection. Patient tolerated the procedure well.

## 2021-06-05 NOTE — Anesthesia Procedure Notes (Signed)
Procedure Name: Intubation Date/Time: 06/05/2021 2:03 PM Performed by: Garrel Ridgel, CRNA Pre-anesthesia Checklist: Patient identified, Emergency Drugs available, Suction available and Patient being monitored Patient Re-evaluated:Patient Re-evaluated prior to induction Oxygen Delivery Method: Circle system utilized Preoxygenation: Pre-oxygenation with 100% oxygen Induction Type: IV induction Ventilation: Mask ventilation without difficulty Laryngoscope Size: 4 Grade View: Grade II Tube type: Oral Tube size: 7.5 mm Number of attempts: 1 Airway Equipment and Method: Stylet and Oral airway Placement Confirmation: ETT inserted through vocal cords under direct vision, positive ETCO2 and breath sounds checked- equal and bilateral Secured at: 23 cm Tube secured with: Tape Dental Injury: Teeth and Oropharynx as per pre-operative assessment

## 2021-06-05 NOTE — Progress Notes (Signed)
AssistedDr. Elgie Congo with right, interscalene , ultrasound guided block. Side rails up, monitors on throughout procedure. See vital signs in flow sheet. Tolerated Procedure well.

## 2021-06-05 NOTE — Transfer of Care (Signed)
Immediate Anesthesia Transfer of Care Note  Patient: Steven [name]  Procedure(s) Performed: REVERSE SHOULDER ARTHROPLASTY (Right: Shoulder)  Patient Location: PACU  Anesthesia Type:GA combined with regional for post-op pain  Level of Consciousness: drowsy and patient cooperative  Airway & Oxygen Therapy: Patient Spontanous Breathing and Patient connected to face mask oxygen  Post-op Assessment: Report given to RN and Post -op Vital signs reviewed and stable  Post vital signs: Reviewed and stable  Last Vitals:  Vitals Value Taken Time  BP 131/72 06/05/21 1620  Temp    Pulse 68 06/05/21 1622  Resp 21 06/05/21 1622  SpO2 100 % 06/05/21 1622  Vitals shown include unvalidated device data.  Last Pain:  Vitals:   06/05/21 1310  TempSrc:   PainSc: 0-No pain      Patients Stated Pain Goal: 3 (53/91/22 5834)  Complications: No notable events documented.

## 2021-06-05 NOTE — Discharge Instructions (Signed)
Ice to the shoulder constantly.  Keep the incision covered and clean and dry for one week, then ok to get it wet in the shower.  Do exercise as instructed several times per day.  DO NOT reach behind your back or push up out of a chair with the operative arm.  Use a sling while you are up and around for comfort, may remove while seated.  Keep pillow propped behind the operative elbow.  Follow up with Dr Veverly Fells in two weeks in the office, call [phone] for appt    PLEASE start back on Sunday 06/07/21 with the morning dosing for Xarelto like you normally do. Do not take Xarelto on Saturday 06/06/21  Call Dr Veverly Fells at [phone] (cell) if you have any questions or concerns.

## 2021-06-05 NOTE — Interval H&P Note (Signed)
History and Physical Interval Note:  06/05/2021 1:51 PM  Steven [name]  has presented today for surgery, with the diagnosis of osteoarthritis of the shoulder region.  The various methods of treatment have been discussed with the patient and family. After consideration of risks, benefits and other options for treatment, the patient has consented to  Procedure(s) with comments: REVERSE SHOULDER ARTHROPLASTY (Right) - with ISB as a surgical intervention.  The patient's history has been reviewed, patient examined, no change in status, stable for surgery.  I have reviewed the patient's chart and labs.  Questions were answered to the patient's satisfaction.     Augustin Schooling

## 2021-06-05 NOTE — Care Plan (Signed)
Ortho Bundle Case Management Note  Patient Details  Name: BEAUMONT AUSTAD MRN: [id number] Date of Birth: 18-Sep-1950                  R Rev TSA on 06/05/21. DCP: Home with wife, Rise Paganini. DME: Sling and ice machine given at hospital. PT: HEP   DME Arranged:  N/A DME Agency:     HH Arranged:    [place] Agency:     Additional Comments: Please contact me with any questions of if this plan should need to change.  Marianne Sofia, RN,CCM [facility]  [phone] 06/05/2021, 2:26 PM

## 2021-06-05 NOTE — Brief Op Note (Signed)
06/05/2021  4:17 PM  PATIENT:  Jaymes Graff Scheidegger  71 y.o. male  PRE-OPERATIVE DIAGNOSIS:  osteoarthritis of the shoulder region, rotator cuff insufficiency, right  POST-OPERATIVE DIAGNOSIS:  osteoarthritis of the shoulder region, rotator cuff insufficiency, right  PROCEDURE:  Procedure(s) with comments: REVERSE SHOULDER ARTHROPLASTY (Right) - with ISB DePuy Delta Xtend with subscap repair  SURGEON:  Surgeon(s) and Role:    Netta Cedars, MD - Primary  PHYSICIAN ASSISTANT:   ASSISTANTS: Ventura Bruns, PA-C   ANESTHESIA:   regional and general  EBL:  200 mL   BLOOD ADMINISTERED:none  DRAINS: none   LOCAL MEDICATIONS USED:  MARCAINE     SPECIMEN:  No Specimen  DISPOSITION OF SPECIMEN:  N/A  COUNTS:  YES  TOURNIQUET:  * No tourniquets in log *  DICTATION: .Other Dictation: Dictation Number [id number]  PLAN OF CARE: Admit for overnight observation  PATIENT DISPOSITION:  PACU - hemodynamically stable.   Delay start of Pharmacological VTE agent (>24hrs) due to surgical blood loss or risk of bleeding: yes

## 2021-06-06 ENCOUNTER — Other Ambulatory Visit: Payer: Self-pay

## 2021-06-06 DIAGNOSIS — M19011 Primary osteoarthritis, right shoulder: Secondary | ICD-10-CM | POA: Diagnosis not present

## 2021-06-06 NOTE — Progress Notes (Addendum)
Subjective: 1 Day Post-Op Procedure(s) (LRB): REVERSE SHOULDER ARTHROPLASTY (Right)  Patient reports pain as mild.    Objective:   VITALS:  Temp:  [97.6 F (36.4 C)-98.6 F (37 C)] 97.7 F (36.5 C) (05/27 0609) Pulse Rate:  [66-89] 77 (05/27 0835) Resp:  [12-24] 17 (05/27 0609) BP: (114-151)/(67-85) 131/72 (05/27 0835) SpO2:  [93 %-100 %] 94 % (05/27 0609) Weight:  [87.3 kg] 87.3 kg (05/26 1242)  Neurovascular intact Sensation intact distally Intact pulses distally Incision: C/D/I, changed to Aquacel Compartment soft   LABS No results for input(s): HGB, WBC, PLT in the last 72 hours. No results for input(s): NA, K, CL, CO2, BUN, CREATININE, GLUCOSE in the last 72 hours. No results for input(s): LABPT, INR in the last 72 hours.   Assessment/Plan: 1 Day Post-Op Procedure(s) (LRB): REVERSE SHOULDER ARTHROPLASTY (Right)  Advance diet Up with therapy D/C IV fluids Discharge home with home health Discontinued lovenox - pt received 80 mg this morning as one time dose. Reviewed with Dr. Veverly Fells - pt will resume his Xarelto tomorrow morning per his usual home regimen. No further anticoagulation to be given today  Armond Hang 06/06/2021, 9:31 AM

## 2021-06-06 NOTE — Progress Notes (Signed)
Pt alert and oriented. Surgical dressing clean, dry and intact. No questions regarding discharge instructions. Pt belongings given back.

## 2021-06-06 NOTE — Op Note (Unsigned)
NAME: Steven [name], [place] RECORD NO: [id number] ACCOUNT NO: [id number] DATE OF BIRTH: [birth year] FACILITY: [facility]ATION: [facility] PHYSICIAN: Doran Heater. Veverly Fells, MD  Operative Report   DATE OF PROCEDURE: 06/05/2021  PREOPERATIVE DIAGNOSES:  Right shoulder end-stage arthritis with rotator cuff insufficiency.  POSTOPERATIVE DIAGNOSES:  Right shoulder end-stage arthritis with rotator cuff insufficiency.  PROCEDURE PERFORMED:  Right reverse shoulder replacement using DePuy Delta Xtend prosthesis with subscapularis repair.  ATTENDING SURGEON:  Doran Heater. Veverly Fells, MD  ASSISTANT:  Darol Destine, MD, PA-C, who was scrubbed during the entire procedure, and necessary for satisfactory completion of surgery.  ANESTHESIA:  General anesthesia was used plus interscalene block.  ESTIMATED BLOOD LOSS:  200 mL  FLUID REPLACEMENT:  1500 mL crystalloid.  INSTRUMENT COUNTS:  Correct.  COMPLICATIONS:  There were no complications.  ANTIBIOTICS:  Perioperative antibiotics were given.  INDICATIONS:  The patient is a 71 year old male with worsening right shoulder pain secondary to progressive arthritis.  The patient has a history of prior rotator cuff surgery.  The patient has rotator cuff weakness, resulting in progressive functional  loss.  Given the constellation of symptoms including pain, limitation in function and a failure of conservative management, we discussed options and recommended reverse shoulder replacement to restore fixed focal mechanics to the shoulder and eliminate  arthritic pain.  The patient agreed.  Informed consent obtained.  DESCRIPTION OF PROCEDURE:  After an adequate level of anesthesia was achieved, the patient was positioned in the modified beach chair position.  Right shoulder correctly identified and sterilely prepped and draped in the usual manner.  Timeout called,  verifying correct patient, correct site. We entered the patient's shoulder using a standard deltopectoral  approach, starting at the coracoid process extending down the anterior humerus.  Dissection down through subcutaneous tissues using Bovie.  We  identified the cephalic vein and took that laterally with the deltoid pectoralis taken medially.  Conjoined tendon identified and retracted medially.  We placed our deep retractors, identified the biceps tendon and tenodesed the biceps in situ with 0  Vicryl figure-of-eight suture.  We then released the subscapularis subperiosteally off the lesser tuberosity and tagged with #2 FiberWire suture in a modified Mason-Allen suture technique for repair of the tendon.  Once we had the subscap released, we  released the inferior capsule progressively externally rotating to expose the humeral head.  We entered the proximal humerus with a 6 mm reamer, reaming up to a size 12 before getting some endosteal contact.  We then went ahead and placed our 12 mm  T-handle guide and resected the humeral head at 20 degrees of retroversion with the oscillating saw.  We irrigated thoroughly.  We then removed excess osteophytes with a rongeur.  Once we had that done, we subluxed the humerus posteriorly getting good  exposure of the glenoid face, which had severe arthritis.  We went ahead and removed the capsule and the labrum and once we had the glenoid exposed, we found our center point for our guide pin, placed our guide pin centered low on the glenoid face and  then reamed for the metaglene baseplate down to subchondral bone.  We then did our peripheral hand reaming and then drilled out our central peg hole with the drill.  Once we had that done, we irrigated thoroughly and then impacted the HA coated press-fit  baseplate into position.  We placed a 42 screw inferiorly, a 36 screw at the base of the coracoid and a 24 screw anteriorly.  We  had 3 really good screws and good baseplate support and stability.  At this point, irrigated again and placed a 42+0  standard glenosphere onto the  baseplate and attached that with a screwdriver.  We did a finger sweep to make sure that there was no soft tissue caught up in the bearing.  Next, we went back to the humeral side, we did our reaming for the 2 right  metaphysis, so we had the 12 stem 2 right metaphysis set on the 0 setting and impacted in 20 degrees of retroversion.  We trialed with a 42+3 poly. We were happy with our soft tissue balancing, appropriate tension on the conjoined and good range of  motion with no instability.  We removed all trial components.  We irrigated thoroughly.  We then used available bone graft from the humeral head in an impaction grafting technique to implant the HA coated press-fit stem size 12 and the HA coated size 2  right metaphysis set on the 0 setting and placed in 20 degrees of retroversion.  Once that was impacted with available bone graft, the stem was stable, we selected the real 42+3 poly, impacted that on the humeral tray and reduced the shoulder.  We had  nice little pop and reduced everything stable throughout a full range of motion.  We irrigated thoroughly.  I did repair the subscapularis back to bone with sutures that I placed through drill holes in the bone prior to placing the stem and we had an  anatomic repair of the subscap.  It did not restrict range of motion or influence stability, so at this point, we were pleased with that subscap repair.  We irrigated again and then repaired deltopectoral interval with 0 Vicryl suture followed by 2-0  Vicryl for subcutaneous closure and 4-0 Monocryl for skin.  Steri-Strips were applied followed by sterile dressing.  The patient tolerated surgery well.      PAA D: 06/05/2021 4:25:35 pm T: 06/06/2021 1:10:00 am  JOB: [id number]/ [id number]

## 2021-06-06 NOTE — Evaluation (Signed)
Occupational Therapy Evaluation Patient Details Name: Steven [name] MRN: [id number] DOB: Oct 17, 1950 Today's Date: 06/06/2021   History of Present Illness patient is a 71 year old male who presented with a right shoulder end stage arthritis with familed conservative measures. patient underwent a right reverse shoulder replacement with subscapularis repair. PMH: hiatal hernia, HTN, displacement of intervertebral disc.   Clinical Impression   s/p shoulder replacement without functional use of right dominant upper extremity secondary to effects of surgery and interscalene block and shoulder precautions. Therapist provided education and instruction to patient and spouse in regards to exercises, precautions, positioning, donning upper extremity clothing and bathing while maintaining shoulder precautions, ice and edema management and donning/doffing sling. Patient and spouse verbalized understanding and demonstrated as needed. Patient needed assistance to donn shirt, underwear, pants, socks and shoes and provided with instruction on compensatory strategies to perform ADLs. Patient to follow up with MD for further therapy needs.        Recommendations for follow up therapy are one component of a multi-disciplinary discharge planning process, led by the attending physician.  Recommendations may be updated based on patient status, additional functional criteria and insurance authorization.   Follow Up Recommendations  Follow physician's recommendations for discharge plan and follow up therapies    Assistance Recommended at Discharge Frequent or constant Supervision/Assistance  Patient can return home with the following A little help with bathing/dressing/bathroom;Assistance with cooking/housework;Direct supervision/assist for financial management;Assist for transportation;Help with stairs or ramp for entrance;Direct supervision/assist for medications management    Functional Status Assessment  Patient  has had a recent decline in their functional status and demonstrates the ability to make significant improvements in function in a reasonable and predictable amount of time.  Equipment Recommendations  None recommended by OT    Recommendations for Other Services       Precautions / Restrictions Precautions Precautions: Shoulder Type of Shoulder Precautions: No ROM shoulder, OK for AROM elbow wrist and hand Shoulder Interventions: Shoulder sling/immobilizer;At all times;Off for dressing/bathing/exercises Precaution Booklet Issued: Yes (comment) (handout) Restrictions Weight Bearing Restrictions: Yes RUE Weight Bearing: Non weight bearing      Mobility Bed Mobility Overal bed mobility: Modified Independent             General bed mobility comments: with increased time and HOB raised    Transfers                          Balance Overall balance assessment: Mild deficits observed, not formally tested                                         ADL either performed or assessed with clinical judgement   ADL Overall ADL's : Needs assistance/impaired Eating/Feeding: Set up;Sitting   Grooming: Wash/dry face;Set up;Standing   Upper Body Bathing: Moderate assistance;Sitting   Lower Body Bathing: Moderate assistance;Sit to/from stand;Sitting/lateral leans   Upper Body Dressing : Moderate assistance;Sitting;Cueing for UE precautions;With caregiver independent assisting   Lower Body Dressing: Min guard;Sit to/from stand;Sitting/lateral leans Lower Body Dressing Details (indicate cue type and reason): with incresed time Toilet Transfer: Min guard;Ambulation Toilet Transfer Details (indicate cue type and reason): standign to urinate for increased ammout of time. Toileting- Clothing Manipulation and Hygiene: Supervision/safety;Sit to/from stand       Functional mobility during ADLs: Min guard       Vision  Patient Visual Report: No change from  baseline       Perception     Praxis      Pertinent Vitals/Pain Pain Assessment Pain Assessment: 0-10 Pain Score: 4  Pain Location: R shoulder Pain Descriptors / Indicators: Discomfort, Grimacing, Operative site guarding Pain Intervention(s): Limited activity within patient's tolerance, Monitored during session, Premedicated before session, Repositioned     Hand Dominance     Extremity/Trunk Assessment Upper Extremity Assessment Upper Extremity Assessment: RUE deficits/detail RUE Deficits / Details: no ROM per shoulder restrictions. AROM of digits, not of forearm at this time   Lower Extremity Assessment Lower Extremity Assessment: Overall WFL for tasks assessed   Cervical / Trunk Assessment Cervical / Trunk Assessment: Normal   Communication Communication Communication: No difficulties   Cognition Arousal/Alertness: Awake/alert Behavior During Therapy: WFL for tasks assessed/performed Overall Cognitive Status: Within Functional Limits for tasks assessed                                       General Comments       Exercises     Shoulder Instructions Shoulder Instructions Donning/doffing shirt without moving shoulder: Caregiver independent with task Method for sponge bathing under operated UE: Caregiver independent with task Donning/doffing sling/immobilizer: Caregiver independent with task Correct positioning of sling/immobilizer: Caregiver independent with task ROM for elbow, wrist and digits of operated UE: Modified independent (with LUE) Sling wearing schedule (on at all times/off for ADL's): Caregiver independent with task Proper positioning of operated UE when showering: Caregiver independent with task Positioning of UE while sleeping: Caregiver independent with task    Home Living Family/patient expects to be discharged to:: Private residence Living Arrangements: Spouse/significant other Available Help at Discharge: Family;Available 24  hours/day                         Home Equipment: Rolling Walker (2 wheels);Shower seat;Cane - quad;Crutches          Prior Functioning/Environment Prior Level of Function : Independent/Modified Independent                        OT Problem List: Decreased range of motion;Decreased activity tolerance;Impaired balance (sitting and/or standing);Decreased safety awareness;Pain;Impaired UE functional use;Decreased knowledge of precautions;Decreased knowledge of use of DME or AE      OT Treatment/Interventions: Self-care/ADL training;Therapeutic exercise;Neuromuscular education;Energy conservation;DME and/or AE instruction;Therapeutic activities;Patient/family education    OT Goals(Current goals can be found in the care plan section) Acute Rehab OT Goals Patient Stated Goal: to go home OT Goal Formulation: All assessment and education complete, DC therapy  OT Frequency: Min 2X/week    Co-evaluation              AM-PAC OT "6 Clicks" Daily Activity     Outcome Measure Help from another person eating meals?: A Little Help from another person taking care of personal grooming?: A Little Help from another person toileting, which includes using toliet, bedpan, or urinal?: A Little Help from another person bathing (including washing, rinsing, drying)?: A Little Help from another person to put on and taking off regular upper body clothing?: A Little Help from another person to put on and taking off regular lower body clothing?: A Little 6 Click Score: 18   End of Session Nurse Communication: Mobility status  Activity Tolerance: Patient tolerated treatment well Patient left: in bed;with  call bell/phone within reach;with family/visitor present  OT Visit Diagnosis: Unsteadiness on feet (R26.81);Pain Pain - Right/Left: Right Pain - part of body: Shoulder                Time: 1121-6244 OT Time Calculation (min): 30 min Charges:  OT General Charges $OT Visit: 1  Visit OT Evaluation $OT Eval Low Complexity: 1 Low OT Treatments $Self Care/Home Management : 8-22 mins  Jackelyn Poling OTR/L, MS Acute Rehabilitation Department Office# [phone] Pager# [phone]   Marcellina Millin 06/06/2021, 10:42 AM

## 2021-06-06 NOTE — Plan of Care (Signed)
  Problem: Education: Goal: Knowledge of General Education information will improve Description: Including pain rating scale, medication(s)/side effects and non-pharmacologic comfort measures Outcome: Progressing   Problem: Clinical Measurements: Goal: Respiratory complications will improve Outcome: Not Applicable Goal: Cardiovascular complication will be avoided Outcome: Not Applicable   Problem: Activity: Goal: Risk for activity intolerance will decrease Outcome: Progressing   Problem: Nutrition: Goal: Adequate nutrition will be maintained Outcome: Progressing   Problem: Elimination: Goal: Will not experience complications related to urinary retention Outcome: Progressing

## 2021-06-09 ENCOUNTER — Encounter (HOSPITAL_COMMUNITY): Payer: Self-pay | Admitting: Orthopedic Surgery

## 2021-06-09 NOTE — Addendum Note (Signed)
Addendum  created 06/09/21 1655 by Nolon Nations, MD   Intraprocedure Staff edited

## 2021-06-10 NOTE — Discharge Summary (Signed)
In most cases prophylactic antibiotics for Dental procdeures after total joint surgery are not necessary.  Exceptions are as follows:  1. History of prior total joint infection  2. Severely immunocompromised (Organ Transplant, cancer chemotherapy, Rheumatoid biologic meds such as [place])  3. Poorly controlled diabetes (A1C &gt; 8.0, blood glucose over 200)  If you have one of these conditions, contact your surgeon for an antibiotic prescription, prior to your dental procedure. Orthopedic Discharge Summary        Physician Discharge Summary  Patient ID: Steven [name] MRN: [id number] DOB/AGE: [birth year] 71 y.o.  Admit date: 06/05/2021 Discharge date: 06/06/21  Procedures:  Procedure(s) (LRB): REVERSE SHOULDER ARTHROPLASTY (Right)  Attending Physician:  Dr. Esmond Plants  Admission Diagnoses:   right shoulder cuff arthropathy  Discharge Diagnoses:  right shoulder cuff arthropathy   Past Medical History:  Diagnosis Date   Arthritis    Clotting disorder ([place])    Displacement of intervertebral disc, site unspecified, without myelopathy    Esophageal reflux    Headache(784.0)    sinus and migraines   Herpes simplex without mention of complication    Hiatal hernia    HTN (hypertension) 05/10/2012   Hyperlipemia    Hypertrophy (benign) of prostate    Intestinal infection due to other organism, not elsewhere classified    Kidney stones    sees Dr. Junious Silk   Personal history of colonic polyps    Personal history of urinary calculi    Personal history of venous thrombosis and embolism    Renal insufficiency     PCP: Laurey Morale, MD   Discharged Condition: good  Hospital Course:  Patient underwent the above stated procedure on 06/05/2021. Patient tolerated the procedure well and brought to the recovery room in good condition and subsequently to the floor. Patient had an uncomplicated hospital course and was stable for discharge.   Disposition: Discharge  disposition: 01-Home or Self Care      with follow up in 2 weeks    Follow-up Information     Netta Cedars, MD. Go on 06/23/2021.   Specialty: Orthopedic Surgery Why: You are scheduled for first post op appointment on Tuesday June 13th at 10:15am. Contact information: [address] [place] [address] [phone]         Netta Cedars, MD. Call in 2 week(s).   Specialty: Orthopedic Surgery Why: call [phone] for appt in two weeks in the office Contact information: [address] [place] [address] [phone]                 Dental Antibiotics:  In most cases prophylactic antibiotics for Dental procdeures after total joint surgery are not necessary.  Exceptions are as follows:  1. History of prior total joint infection  2. Severely immunocompromised (Organ Transplant, cancer chemotherapy, Rheumatoid biologic meds such as [place])  3. Poorly controlled diabetes (A1C &gt; 8.0, blood glucose over 200)  If you have one of these conditions, contact your surgeon for an antibiotic prescription, prior to your dental procedure.  Discharge Instructions     Discharge patient   Complete by: As directed    Discharge disposition: 01-Home or Self Care   Discharge patient date: 06/06/2021       Allergies as of 06/06/2021       Reactions   Morphine Other (See Comments)   headache   Amitriptyline Hcl Other (See Comments)   Mood changes   Amoxicillin-pot Clavulanate Nausea Only   Cyclobenzaprine  Hcl    unknown   Levofloxacin Swelling   Lisinopril-hydrochlorothiazide    depression        Medication List     STOP taking these medications    enoxaparin 80 MG/0.8ML injection Commonly known as: LOVENOX       TAKE these medications    acetaminophen 650 MG CR tablet Commonly known as: TYLENOL Take 650-1,300 mg by mouth every 8 (eight) hours as needed for pain.   clobetasol cream 0.05 % Commonly known as:  TEMOVATE Apply 1 application. topically 2 (two) times daily as needed for itching.   ezetimibe 10 MG tablet Commonly known as: ZETIA Take 1 tablet (10 mg total) by mouth daily.   fenofibrate 145 MG tablet Commonly known as: TRICOR Take 1 tablet (145 mg total) by mouth every evening.   fluticasone 50 MCG/ACT nasal spray Commonly known as: FLONASE Place 1 spray into both nostrils daily as needed for allergies or rhinitis.   folic acid 673 MCG tablet Commonly known as: FOLVITE Take 800 mcg by mouth daily.   loratadine 10 MG tablet Commonly known as: CLARITIN Take 10 mg by mouth daily as needed for allergies.   losartan 50 MG tablet Commonly known as: COZAAR Take 1 tablet (50 mg total) by mouth daily.   magic mouthwash (nystatin, hydrocortisone, diphenhydrAMINE) suspension Swish and spit 5 mLs 3 (three) times daily as needed for mouth pain.   methocarbamol 500 MG tablet Commonly known as: ROBAXIN Take 1 tablet (500 mg total) by mouth every 6 (six) hours as needed for muscle spasms.   metoCLOPramide 10 MG tablet Commonly known as: REGLAN Take 1 tablet (10 mg total) by mouth every 8 (eight) hours as needed (hiccups).   multivitamin with minerals Tabs tablet Take 1 tablet by mouth daily.   ondansetron 4 MG tablet Commonly known as: Zofran Take 1 tablet (4 mg total) by mouth every 8 (eight) hours as needed for nausea, vomiting or refractory nausea / vomiting.   oxyCODONE-acetaminophen 5-325 MG tablet Commonly known as: Percocet Take 1-2 tablets by mouth every 4 (four) hours as needed for severe pain or moderate pain.   RABEprazole 20 MG tablet Commonly known as: Aciphex Take 1 tablet (20 mg total) by mouth 2 (two) times daily.   rivaroxaban 20 MG Tabs tablet Commonly known as: Xarelto Take 1 tablet (20 mg total) by mouth daily with supper.   temazepam 30 MG capsule Commonly known as: RESTORIL Take 1 capsule (30 mg total) by mouth at bedtime as needed for sleep.    vitamin C 1000 MG tablet Take 1,000 mg by mouth daily.   VITAMIN C PO Take by mouth.   Vitamin D 50 MCG (2000 UT) tablet Take 2,000 Units by mouth daily.   zinc gluconate 50 MG tablet Take 100 mg by mouth daily.          Signed: Ventura Bruns 06/10/2021, 7:39 AM  [facility] is now [facility] [address], [place], [place], [place] [address] Phone: [place]

## 2021-06-19 ENCOUNTER — Encounter (HOSPITAL_BASED_OUTPATIENT_CLINIC_OR_DEPARTMENT_OTHER): Payer: Self-pay | Admitting: Emergency Medicine

## 2021-06-19 ENCOUNTER — Other Ambulatory Visit: Payer: Self-pay

## 2021-06-19 ENCOUNTER — Emergency Department (HOSPITAL_BASED_OUTPATIENT_CLINIC_OR_DEPARTMENT_OTHER): Payer: PPO | Admitting: Radiology

## 2021-06-19 ENCOUNTER — Emergency Department (HOSPITAL_BASED_OUTPATIENT_CLINIC_OR_DEPARTMENT_OTHER)
Admission: EM | Admit: 2021-06-19 | Discharge: 2021-06-19 | Disposition: A | Discharge status: Home / Self Care | Payer: PPO | Attending: Emergency Medicine | Admitting: Emergency Medicine

## 2021-06-19 ENCOUNTER — Emergency Department (HOSPITAL_BASED_OUTPATIENT_CLINIC_OR_DEPARTMENT_OTHER): Payer: PPO

## 2021-06-19 DIAGNOSIS — R0789 Other chest pain: Secondary | ICD-10-CM | POA: Diagnosis not present

## 2021-06-19 DIAGNOSIS — Z96619 Presence of unspecified artificial shoulder joint: Secondary | ICD-10-CM | POA: Diagnosis not present

## 2021-06-19 DIAGNOSIS — I10 Essential (primary) hypertension: Secondary | ICD-10-CM | POA: Insufficient documentation

## 2021-06-19 DIAGNOSIS — R079 Chest pain, unspecified: Secondary | ICD-10-CM

## 2021-06-19 DIAGNOSIS — Z7901 Long term (current) use of anticoagulants: Secondary | ICD-10-CM | POA: Insufficient documentation

## 2021-06-19 DIAGNOSIS — Z79899 Other long term (current) drug therapy: Secondary | ICD-10-CM | POA: Insufficient documentation

## 2021-06-19 DIAGNOSIS — R7989 Other specified abnormal findings of blood chemistry: Secondary | ICD-10-CM | POA: Diagnosis not present

## 2021-06-19 LAB — BASIC METABOLIC PANEL
Anion gap: 10 (ref 5–15)
BUN: 20 mg/dL (ref 8–23)
CO2: 26 mmol/L (ref 22–32)
Calcium: 10.1 mg/dL (ref 8.9–10.3)
Chloride: 105 mmol/L (ref 98–111)
Creatinine, Ser: 1.28 mg/dL — ABNORMAL HIGH (ref 0.61–1.24)
GFR, Estimated: 59 mL/min — ABNORMAL LOW (ref >60)
Glucose, Bld: 96 mg/dL (ref 70–99)
Potassium: 4.2 mmol/L (ref 3.5–5.1)
Sodium: 141 mmol/L (ref 135–145)

## 2021-06-19 LAB — CBC
HCT: 38.3 % — ABNORMAL LOW (ref 39.0–52.0)
Hemoglobin: 12.8 g/dL — ABNORMAL LOW (ref 13.0–17.0)
MCH: 29 pg (ref 26.0–34.0)
MCHC: 33.4 g/dL (ref 30.0–36.0)
MCV: 86.8 fL (ref 80.0–100.0)
Platelets: 392 10*3/uL (ref 150–400)
RBC: 4.41 MIL/uL (ref 4.22–5.81)
RDW: 13 % (ref 11.5–15.5)
WBC: 6.7 10*3/uL (ref 4.0–10.5)
nRBC: 0 % (ref 0.0–0.2)

## 2021-06-19 LAB — TROPONIN I (HIGH SENSITIVITY)
Troponin I (High Sensitivity): 4 ng/L (ref <18)
Troponin I (High Sensitivity): 4 ng/L (ref <18)

## 2021-06-19 LAB — D-DIMER, QUANTITATIVE: D-Dimer, Quant: 1.25 ug/mL-FEU — ABNORMAL HIGH (ref 0.00–0.50)

## 2021-06-19 MED ORDER — IOHEXOL 350 MG/ML SOLN
100.0000 mL | Freq: Once | INTRAVENOUS | Status: AC | PRN
  Administered 2021-06-19: 75 mL via INTRAVENOUS

## 2021-06-19 MED ORDER — ACETAMINOPHEN 500 MG PO TABS
1000.0000 mg | ORAL_TABLET | Freq: Once | ORAL | Status: AC
Start: 2021-06-19 — End: 2021-06-19
  Administered 2021-06-19: 1000 mg via ORAL
  Filled 2021-06-19: qty 2

## 2021-06-19 NOTE — ED Provider Notes (Signed)
[facility] EMERGENCY DEPT Provider Note   CSN: [id number] Arrival date & time: 06/19/21  1241     History  Chief Complaint  Steven [name] presents with   Chest Pain    Steven [name] is a 71 y.o. male.  Steven [name] is a 71 year old male who presents with chest pain.  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  Steven [name] had a recent shoulder replacement about 2 weeks ago.  Steven [name] stopped taking his Xarelto for period of time around the surgery.  Today Steven [name] started having some chest pain.  It actually started last night but was worse this morning.  Steven [name] describes it as sometimes sharp sometimes deep achy pain in the center of his chest and sometimes to the right side.  It goes through to his back at times.  It is intermittent and at times will completely go away.  Steven [name] denies any associated shortness of breath.  No nausea or vomiting.  No diaphoresis.  A couple times it has come on with exertion.  It happened in the ED when Steven [name] was walking to the bathroom and started.  No history of similar symptoms in the past.  No known history of coronary artery disease.  No increased leg swelling or pain.  No fevers, cough or cold symptoms.       Home Medications Prior to Admission medications   Medication Sig Start Date End Date Taking? Authorizing Provider  acetaminophen (TYLENOL) 650 MG CR tablet Take 650-1,300 mg by mouth every 8 (eight) hours as needed for pain.    [provider]  Ascorbic Acid (VITAMIN C PO) Take by mouth.    [provider]  Ascorbic Acid (VITAMIN C) 1000 MG tablet Take 1,000 mg by mouth daily.    [provider]  Cholecalciferol (VITAMIN D) 50 MCG (2000 UT) tablet Take 2,000 Units by mouth daily.    [provider]  clobetasol cream (TEMOVATE) 1.32 % Apply 1 application. topically 2 (two) times daily as needed for itching. 03/05/21   [provider]  ezetimibe  (ZETIA) 10 MG tablet Take 1 tablet (10 mg total) by mouth daily. 01/28/21   Laurey Morale, MD  fenofibrate (TRICOR) 145 MG tablet Take 1 tablet (145 mg total) by mouth every evening. 11/27/20   Laurey Morale, MD  fluticasone (FLONASE) 50 MCG/ACT nasal spray Place 1 spray into both nostrils daily as needed for allergies or rhinitis.    [provider]  folic acid (FOLVITE) 440 MCG tablet Take 800 mcg by mouth daily.    [provider]  loratadine (CLARITIN) 10 MG tablet Take 10 mg by mouth daily as needed for allergies.    [provider]  losartan (COZAAR) 50 MG tablet Take 1 tablet (50 mg total) by mouth daily. 01/28/21   Laurey Morale, MD  magic mouthwash (nystatin, hydrocortisone, diphenhydrAMINE) suspension Swish and spit 5 mLs 3 (three) times daily as needed for mouth pain. 12/12/20   Laurey Morale, MD  methocarbamol (ROBAXIN) 500 MG tablet Take 1 tablet (500 mg total) by mouth every 6 (six) hours as needed for muscle spasms. 06/05/21   Netta Cedars, MD  metoCLOPramide (REGLAN) 10 MG tablet Take 1 tablet (10 mg total) by mouth every 8 (eight) hours as needed (hiccups). 01/17/21   Fatima Blank, MD  Multiple Vitamin (MULTIVITAMIN WITH MINERALS) TABS tablet Take 1 tablet by mouth daily.    [provider]  ondansetron (ZOFRAN) 4 MG tablet Take 1 tablet (4 mg total) by mouth every 8 (eight) hours as needed for nausea, vomiting or refractory nausea / vomiting. 06/05/21 06/05/22  Netta Cedars, MD  oxyCODONE-acetaminophen (PERCOCET) 5-325 MG tablet Take 1-2 tablets by mouth every 4 (four) hours as needed for severe pain or moderate pain. 06/05/21 06/05/22  Netta Cedars, MD  RABEprazole (ACIPHEX) 20 MG tablet Take 1 tablet (20 mg total) by mouth 2 (two) times daily. 05/15/21   Laurey Morale, MD  rivaroxaban (XARELTO) 20 MG TABS tablet Take 1 tablet (20 mg total) by mouth daily with supper. 05/11/21   Laurey Morale, MD  temazepam (RESTORIL) 30 MG capsule Take 1  capsule (30 mg total) by mouth at bedtime as needed for sleep. 05/15/21   Laurey Morale, MD  zinc gluconate 50 MG tablet Take 100 mg by mouth daily.    [provider]      Allergies    Morphine, Amitriptyline hcl, Amoxicillin-pot clavulanate, Cyclobenzaprine hcl, Levofloxacin, and Lisinopril-hydrochlorothiazide    Review of Systems   Review of Systems  Constitutional:  Negative for chills, diaphoresis, fatigue and fever.  HENT:  Negative for congestion, rhinorrhea and sneezing.   Eyes: Negative.   Respiratory:  Negative for cough, chest tightness and shortness of breath.   Cardiovascular:  Positive for chest pain. Negative for leg swelling.  Gastrointestinal:  Negative for abdominal pain, blood in stool, diarrhea, nausea and vomiting.  Genitourinary:  Negative for difficulty urinating, flank pain, frequency and hematuria.  Musculoskeletal:  Negative for arthralgias and back pain.  Skin:  Negative for rash.  Neurological:  Negative for dizziness, speech difficulty, weakness, numbness and headaches.    Physical Exam Updated Vital Signs BP (!) 152/90   Pulse 76   Temp 97.8 F (36.6 C)   Resp 16   Ht '5\' 10"'$  (1.778 m)   Wt 83.9 kg   SpO2 98%   BMI 26.54 kg/m  Physical Exam Constitutional:      Appearance: Steven [name] is well-developed.  HENT:     Head: Normocephalic and atraumatic.  Eyes:     Pupils: Pupils are equal, round, and reactive to light.  Cardiovascular:     Rate and Rhythm: Normal rate and regular rhythm.     Heart sounds: Normal heart sounds.  Pulmonary:     Effort: Pulmonary effort is normal. No respiratory distress.     Breath sounds: Normal breath sounds. No wheezing or rales.  Chest:     Chest wall: No tenderness.  Abdominal:     General: Bowel sounds are normal.     Palpations: Abdomen is soft.     Tenderness: There is no abdominal tenderness. There is no guarding or rebound.  Musculoskeletal:        General: Normal range of motion.     Cervical back:  Normal range of motion and neck supple.     Comments: No edema or calf tenderness  Lymphadenopathy:     Cervical: No cervical adenopathy.  Skin:    General: Skin is warm and dry.     Findings: No rash.  Neurological:     Mental Status: Steven [name] is alert and oriented to person, place, and time.     ED Results / Procedures / Treatments   Labs (all labs ordered are listed, but only abnormal results are displayed) Labs Reviewed  BASIC METABOLIC PANEL - Abnormal; Notable for the following components:      Result Value   Creatinine,  Ser 1.28 (*)    GFR, Estimated 59 (*)    All other components within normal limits  CBC - Abnormal; Notable for the following components:   Hemoglobin 12.8 (*)    HCT 38.3 (*)    All other components within normal limits  D-DIMER, QUANTITATIVE - Abnormal; Notable for the following components:   D-Dimer, Quant 1.25 (*)    All other components within normal limits  TROPONIN I (HIGH SENSITIVITY)  TROPONIN I (HIGH SENSITIVITY)    EKG EKG Interpretation  Date/Time:  Friday June 19 2021 12:55:54 EDT Ventricular Rate:  62 PR Interval:  190 QRS Duration: 100 QT Interval:  416 QTC Calculation: 422 R Axis:   -19 Text Interpretation: Normal sinus rhythm Incomplete right bundle branch block Minimal voltage criteria for LVH, may be normal variant ( R in aVL ) Borderline ECG When compared with ECG of 16-Jan-2021 20:25, No significant change was found since last tracing no significant change Confirmed by Malvin Johns [phone]) on 06/19/2021 3:27:57 PM  Radiology CT Angio Chest PE W/Cm &/Or Wo Cm  Result Date: 06/19/2021 CLINICAL DATA:  Intermittent chest pain.  Elevated D-dimer. EXAM: CT ANGIOGRAPHY CHEST WITH CONTRAST TECHNIQUE: Multidetector CT imaging of the chest was performed using the standard protocol during bolus administration of intravenous contrast. Multiplanar CT image reconstructions and MIPs were obtained to evaluate the vascular anatomy. RADIATION DOSE  REDUCTION: This exam was performed according to the departmental dose-optimization program which includes automated exposure control, adjustment of the mA and/or kV according to Steven [name] size and/or use of iterative reconstruction technique. CONTRAST:  30m OMNIPAQUE IOHEXOL 350 MG/ML SOLN COMPARISON:  01/18/2017 FINDINGS: Cardiovascular: Pulmonary arteries are well opacified. There is no evidence of pulmonary embolism. Central pulmonary arteries are normal in caliber. Thoracic aorta is normal in caliber. The heart size is normal. No significant calcified coronary artery plaque identified. No evidence of pericardial fluid. Mediastinum/Nodes: No enlarged mediastinal, hilar, or axillary lymph nodes. Thyroid gland, trachea, and esophagus demonstrate no significant findings. Lungs/Pleura: There is no evidence of pulmonary edema, consolidation, pneumothorax, nodule or pleural fluid. Upper Abdomen: Stable evidence of hepatic steatosis and mild chronic elevation of the right hemidiaphragm. Musculoskeletal: No chest wall abnormality. No acute or significant osseous findings. Review of the MIP images confirms the above findings. IMPRESSION: 1. No evidence of pulmonary embolism. 2. Stable hepatic steatosis. 3. Stable chronic elevation of the right hemidiaphragm. Electronically Signed   By: GAletta EdouardM.D.   On: 06/19/2021 16:50   DG Chest 2 View  Result Date: 06/19/2021 CLINICAL DATA:  Intermittent chest pain for couple days, post RIGHT shoulder replacement surgery 06/05/2021 EXAM: CHEST - 2 VIEW COMPARISON:  01/17/2021 FINDINGS: Normal heart size, mediastinal contours, and pulmonary vascularity. Lungs clear. No infiltrate, pleural effusion, or pneumothorax. Reverse RIGHT shoulder arthroplasty. IMPRESSION: No acute abnormalities. Electronically Signed   By: MLavonia DanaM.D.   On: 06/19/2021 14:37    Procedures Procedures    Medications Ordered in ED Medications  acetaminophen (TYLENOL) tablet 1,000 mg (1,000 mg  Oral Given 06/19/21 1537)  iohexol (OMNIPAQUE) 350 MG/ML injection 100 mL (75 mLs Intravenous Contrast Given 06/19/21 1635)    ED Course/ Medical Decision Making/ A&P                           Medical Decision Making Amount and/or Complexity of Data Reviewed Labs: ordered. Radiology: ordered.  Risk OTC drugs. Prescription drug management.   Steven [name] is a 71year old male  who presents with chest pain.  No associated symptoms.  His pain is somewhat atypical being in the center of his chest.  Steven [name] does have a history of hypertension, hyperlipidemia and a strong family history of heart disease.  Steven [name] had some exertional symptoms but also symptoms at rest.  Steven [name] had a chest x-ray which was interpreted by me and confirmed by the radiologist to show no acute abnormality.  No pulmonary edema.  No pneumonia.  His EKG does not show any ischemic changes.  Steven [name] has had 2 negative troponins.  His D-dimer is elevated and given his prior history of PEs, CTA was performed which shows no evidence of PE.  No other acute abnormality.  Initially I was going to admit him due to his elevated heart score.  However his chart was reviewed and Steven [name] had a recent cardiac CT a little over a year ago which showed calcium score of 0.  Steven [name] also had a normal echo at that time.  I consulted with the cardiologist on-call, Dr. Domenic Polite who feels that Steven [name] can be discharged with close follow-up with Dr. Gwenlyn Found.  I discussed with the Steven [name] and the Steven [name]'s wife.  They are good with this plan.  They were instructed to call Monday to have close follow-up with Dr. Gwenlyn Found.  Return precautions were given.  Final Clinical Impression(s) / ED Diagnoses Final diagnoses:  Chest pain, unspecified type    Rx / DC Orders ED Discharge Orders     None         Malvin Johns, MD 06/19/21 1944

## 2021-06-19 NOTE — Discharge Instructions (Addendum)
Call on Monday to make a close follow-up appointment with Dr. Alvester Chou.  Return to the emergency room if you have any worsening symptoms.

## 2021-06-19 NOTE — ED Triage Notes (Signed)
Intermittent chest pain for a couple of days. Pt had R shoulder replacement 5/26

## 2021-06-19 NOTE — ED Notes (Signed)
ED Provider at bedside. 

## 2021-06-23 DIAGNOSIS — Z4789 Encounter for other orthopedic aftercare: Secondary | ICD-10-CM | POA: Diagnosis not present

## 2021-06-26 NOTE — Telephone Encounter (Signed)
error 

## 2021-07-20 DIAGNOSIS — M25611 Stiffness of right shoulder, not elsewhere classified: Secondary | ICD-10-CM | POA: Diagnosis not present

## 2021-07-20 DIAGNOSIS — M25511 Pain in right shoulder: Secondary | ICD-10-CM | POA: Diagnosis not present

## 2021-07-24 ENCOUNTER — Telehealth: Payer: Self-pay | Admitting: Family Medicine

## 2021-07-24 DIAGNOSIS — I82409 Acute embolism and thrombosis of unspecified deep veins of unspecified lower extremity: Secondary | ICD-10-CM

## 2021-07-24 DIAGNOSIS — M25611 Stiffness of right shoulder, not elsewhere classified: Secondary | ICD-10-CM | POA: Diagnosis not present

## 2021-07-24 DIAGNOSIS — M25511 Pain in right shoulder: Secondary | ICD-10-CM | POA: Diagnosis not present

## 2021-07-24 DIAGNOSIS — E785 Hyperlipidemia, unspecified: Secondary | ICD-10-CM

## 2021-07-24 MED ORDER — RIVAROXABAN 20 MG PO TABS: 20.0000 mg | ORAL_TABLET | Freq: Every day | ORAL | 3 refills | Status: DC

## 2021-07-24 MED ORDER — NYSTATIN 100000 UNIT/ML MT SUSP: 5.0000 mL | Freq: Three times a day (TID) | OROMUCOSAL | 11 refills | Status: DC | PRN

## 2021-07-24 MED ORDER — EZETIMIBE 10 MG PO TABS: 10.0000 mg | ORAL_TABLET | Freq: Every day | ORAL | 3 refills | Status: DC

## 2021-07-24 MED ORDER — FENOFIBRATE 145 MG PO TABS: 145.0000 mg | ORAL_TABLET | Freq: Every evening | ORAL | 3 refills | Status: DC

## 2021-07-24 NOTE — Telephone Encounter (Signed)
Done

## 2021-07-24 NOTE — Telephone Encounter (Signed)
Refill was sent to pharmacy on today.   Patient requesting larger bottle.

## 2021-07-24 NOTE — Telephone Encounter (Signed)
Pt prescription for magic mouthwash (nystatin, hydrocortisone, diphenhydrAMINE) suspension, reqeusting 324m instead of 1258m

## 2021-07-27 ENCOUNTER — Telehealth: Payer: Self-pay | Admitting: Family Medicine

## 2021-07-27 DIAGNOSIS — M25611 Stiffness of right shoulder, not elsewhere classified: Secondary | ICD-10-CM | POA: Diagnosis not present

## 2021-07-27 DIAGNOSIS — M25511 Pain in right shoulder: Secondary | ICD-10-CM | POA: Diagnosis not present

## 2021-07-27 NOTE — Telephone Encounter (Signed)
Left message for patient to call back and schedule Medicare Annual Wellness Visit (AWV) either virtually or in office. Left  my Steven [name] number [phone]   Last AWV ; 07/29/20 please schedule at anytime with [facility] Nurse Health Advisor 1 or 2

## 2021-07-31 ENCOUNTER — Ambulatory Visit (INDEPENDENT_AMBULATORY_CARE_PROVIDER_SITE_OTHER): Payer: PPO

## 2021-07-31 VITALS — Ht 69.5 in | Wt 185.0 lb

## 2021-07-31 DIAGNOSIS — Z0000 Encounter for general adult medical examination without abnormal findings: Secondary | ICD-10-CM

## 2021-07-31 DIAGNOSIS — M25611 Stiffness of right shoulder, not elsewhere classified: Secondary | ICD-10-CM | POA: Diagnosis not present

## 2021-07-31 DIAGNOSIS — M25511 Pain in right shoulder: Secondary | ICD-10-CM | POA: Diagnosis not present

## 2021-07-31 NOTE — Progress Notes (Signed)
Subjective:   Steven [name] is a 71 y.o. male who presents for Medicare Annual/Subsequent preventive examination.  Review of Systems    Virtual Visit via Telephone Note  I connected with  Steven [name] on 07/31/21 at  8:15 AM EDT by telephone and verified that I am speaking with the correct person using two identifiers.  Location: Patient: Home Provider: Office Persons participating in the virtual visit: patient/Nurse Health Advisor   I discussed the limitations, risks, security and privacy concerns of performing an evaluation and management service by telephone and the availability of in person appointments. The patient expressed understanding and agreed to proceed.  Interactive audio and video telecommunications were attempted between this nurse and patient, however failed, due to patient having technical difficulties OR patient did not have access to video capability.  We continued and completed visit with audio only.  Some vital signs may be absent or patient reported.   Criselda Peaches, LPN  Cardiac Risk Factors include: advanced age (>71mn, >>80women);hypertension;male gender     Objective:    Today's Vitals   07/31/21 0820  Weight: 185 lb (83.9 kg)  Height: 5' 9.5" (1.765 m)   Body mass index is 26.93 kg/m.     07/31/2021    8:29 AM 06/19/2021   12:55 PM 06/06/2021    3:00 AM 06/01/2021    9:01 AM 01/16/2021    8:13 PM 08/14/2020    7:14 AM 07/29/2020    9:08 AM  Advanced Directives  Does Patient Have a Medical Advance Directive? Yes Yes Yes Yes No Yes Yes  Type of AParamedicof [place]od[place]Living will [place]Living will [place]er CreekLiving will [place]Living will  [place]Living will HDe [place]Living will  Does patient want to make changes to medical advance directive? No - Patient declined  No - Patient declined      Copy of [place]in Chart? No - copy requested  No - copy requested No - copy requested  No - copy requested No - copy requested  Would patient like information on creating a medical advance directive?     No - Patient declined      Current Medications (verified) Outpatient Encounter Medications as of 07/31/2021  Medication Sig   acetaminophen (TYLENOL) 650 MG CR tablet Take 650-1,300 mg by mouth every 8 (eight) hours as needed for pain.   Ascorbic Acid (VITAMIN C PO) Take by mouth.   Ascorbic Acid (VITAMIN C) 1000 MG tablet Take 1,000 mg by mouth daily.   Cholecalciferol (VITAMIN D) 50 MCG (2000 UT) tablet Take 2,000 Units by mouth daily.   clobetasol cream (TEMOVATE) 09.62% Apply 1 application. topically 2 (two) times daily as needed for itching.   ezetimibe (ZETIA) 10 MG tablet Take 1 tablet (10 mg total) by mouth daily.   fenofibrate (TRICOR) 145 MG tablet Take 1 tablet (145 mg total) by mouth every evening.   fluticasone (FLONASE) 50 MCG/ACT nasal spray Place 1 spray into both nostrils daily as needed for allergies or rhinitis.   folic acid (FOLVITE) 8836MCG tablet Take 800 mcg by mouth daily.   loratadine (CLARITIN) 10 MG tablet Take 10 mg by mouth daily as needed for allergies.   losartan (COZAAR) 50 MG tablet Take 1 tablet (50 mg total) by mouth daily.   magic mouthwash (nystatin, hydrocortisone, diphenhydrAMINE) suspension Swish and spit 5 mLs 3 (three) times  daily as needed for mouth pain.   methocarbamol (ROBAXIN) 500 MG tablet Take 1 tablet (500 mg total) by mouth every 6 (six) hours as needed for muscle spasms.   metoCLOPramide (REGLAN) 10 MG tablet Take 1 tablet (10 mg total) by mouth every 8 (eight) hours as needed (hiccups).   Multiple Vitamin (MULTIVITAMIN WITH MINERALS) TABS tablet Take 1 tablet by mouth daily.   ondansetron (ZOFRAN) 4 MG tablet Take 1 tablet (4 mg total) by mouth every 8 (eight) hours as needed for nausea, vomiting or refractory nausea / vomiting.    oxyCODONE-acetaminophen (PERCOCET) 5-325 MG tablet Take 1-2 tablets by mouth every 4 (four) hours as needed for severe pain or moderate pain.   RABEprazole (ACIPHEX) 20 MG tablet Take 1 tablet (20 mg total) by mouth 2 (two) times daily.   rivaroxaban (XARELTO) 20 MG TABS tablet Take 1 tablet (20 mg total) by mouth daily with supper.   temazepam (RESTORIL) 30 MG capsule Take 1 capsule (30 mg total) by mouth at bedtime as needed for sleep.   zinc gluconate 50 MG tablet Take 100 mg by mouth daily.   No facility-administered encounter medications on file as of 07/31/2021.    Allergies (verified) Morphine, Amitriptyline hcl, Amoxicillin-pot clavulanate, Cyclobenzaprine hcl, Levofloxacin, and Lisinopril-hydrochlorothiazide   History: Past Medical History:  Diagnosis Date   Arthritis    Clotting disorder ([place])    Displacement of intervertebral disc, site unspecified, without myelopathy    Esophageal reflux    Headache(784.0)    sinus and migraines   Herpes simplex without mention of complication    Hiatal hernia    HTN (hypertension) 05/10/2012   Hyperlipemia    Hypertrophy (benign) of prostate    Intestinal infection due to other organism, not elsewhere classified    Kidney stones    sees Dr. Junious Silk   Personal history of colonic polyps    Personal history of urinary calculi    Personal history of venous thrombosis and embolism    Renal insufficiency    Past Surgical History:  Procedure Laterality Date   APPENDECTOMY     BOTOX INJECTION N/A 04/13/2012   Procedure: BOTOX INJECTION;  Surgeon: Milus Banister, MD;  Location: [facility];  Service: Endoscopy;  Laterality: N/A;   [place] and 1998   CHOLECYSTECTOMY     COLONOSCOPY  07/03/2015   per Dr. Ardis Hughs, single polyp removed but not retrieved, left sided diverticula, repeat in 5 yrs    ESOPHAGEAL MANOMETRY N/A 02/26/2013   Procedure: ESOPHAGEAL MANOMETRY (EM);  Surgeon: Milus Banister, MD;  Location: [facility];  Service: Endoscopy;  Laterality: N/A;   ESOPHAGEAL MANOMETRY N/A 11/03/2018   Procedure: ESOPHAGEAL MANOMETRY (EM);  Surgeon: Milus Banister, MD;  Location: [facility];  Service: Endoscopy;  Laterality: N/A;   ESOPHAGOGASTRODUODENOSCOPY  10/17/2018   per Dr. Ardis Hughs, small hiatal hernia only    ESOPHAGOGASTRODUODENOSCOPY (EGD) WITH ESOPHAGEAL DILATION N/A 04/13/2012   Procedure: ESOPHAGOGASTRODUODENOSCOPY (EGD) WITH ESOPHAGEAL DILATION;  Surgeon: Milus Banister, MD;  Location: [facility];  Service: Endoscopy;  Laterality: N/A;  possible botox injection   EXTRACORPOREAL SHOCK WAVE LITHOTRIPSY Left 08/14/2020   Procedure: LEFT EXTRACORPOREAL SHOCK WAVE LITHOTRIPSY (ESWL);  Surgeon: [name], MD;  Location: [facility];  Service: Urology;  Laterality: Left;   FINGER SURGERY     3rd finger trigger bilateral    GALLBLADDER SURGERY     LITHOTRIPSY     REVERSE SHOULDER ARTHROPLASTY Right 06/05/2021  Procedure: REVERSE SHOULDER ARTHROPLASTY;  Surgeon: Netta Cedars, MD;  Location: [facility];  Service: Orthopedics;  Laterality: Right;  with ISB   ROTATOR CUFF REPAIR Right 01/10/2012   per Dr. Esmond Plants, also repair torn biceps tendon    SHOULDER SURGERY Right    STERIOD INJECTION Left 07/27/2013   Procedure: STEROID INJECTION;  Surgeon: Augustin Schooling, MD;  Location: [place];  Service: Orthopedics;  Laterality: Left;   TONSILLECTOMY     TRIGGER FINGER RELEASE Right 07/27/2013   Procedure: RIGHT RING FINGER A-1 RELEASE;  Surgeon: Augustin Schooling, MD;  Location: [place];  Service: Orthopedics;  Laterality: Right;   URETERAL STENT PLACEMENT  07/2010   left ureter, per Dr. Junious Silk, for a stone    Family History  Problem Relation Age of Onset   Colon cancer Father    Heart attack Father    Heart disease Brother    Heart disease Brother    Esophageal cancer Neg Hx    Rectal cancer Neg Hx    Stomach cancer Neg Hx    Social History   Socioeconomic  History   Marital status: Married    Spouse name: Not on file   Number of children: Not on file   Years of education: Not on file   Highest education level: Not on file  Occupational History   Occupation: Retired  Tobacco Use   Smoking status: Former    Packs/day: 1.50    Years: 20.00    Total pack years: 30.00    Types: Cigarettes    Quit date: 03/03/1973    Years since quitting: 48.4    Passive exposure: Past   Smokeless tobacco: Never   Tobacco comments:    discussed AAA; did have CT of abd/ pelvis   Vaping Use   Vaping Use: Never used  Substance and Sexual Activity   Alcohol use: No    Alcohol/week: 0.0 standard drinks of alcohol   Drug use: No   Sexual activity: Not on file  Other Topics Concern   Not on file  Social History Narrative   Not on file   Social Determinants of Health   Financial Resource Strain: Low Risk  (07/31/2021)   Overall Financial Resource Strain (CARDIA)    Difficulty of Paying Living Expenses: Not hard at all  Food Insecurity: No Food Insecurity (07/31/2021)   Hunger Vital Sign    Worried About Running Out of Food in the Last Year: Never true    [place] in the Last Year: Never true  Transportation Needs: No Transportation Needs (07/31/2021)   PRAPARE - Hydrologist (Medical): No    Lack of Transportation (Non-Medical): No  Physical Activity: Insufficiently Active (07/31/2021)   Exercise Vital Sign    Days of Exercise per Week: 2 days    Minutes of Exercise per Session: 40 min  Stress: No Stress Concern Present (07/31/2021)   [place]    Feeling of Stress : Not at all  Social Connections: [place] (07/31/2021)   Social Connection and Isolation Panel [NHANES]    Frequency of Communication with Friends and Family: More than three times a week    Frequency of Social Gatherings with Friends and Family: More than three times a week     Attends Religious Services: More than 4 times per year    Active Member of [employer] or Organizations: Not on file  Attends Music therapist: More than 4 times per year    Marital Status: Married    Tobacco Counseling Counseling given: Not Answered Tobacco comments: discussed AAA; did have CT of abd/ pelvis    Clinical Intake:   Diabetic?  No  Interpreter Needed?: No  Activities of Daily Living    07/31/2021    8:28 AM 06/06/2021    3:00 AM  In your present state of health, do you have any difficulty performing the following activities:  Hearing? 0 0  Vision? 0 0  Difficulty concentrating or making decisions? 0 0  Walking or climbing stairs? 0 0  Dressing or bathing? 0 0  Doing errands, shopping? 0 0  Preparing Food and eating ? N   Using the Toilet? N   In the past six months, have you accidently leaked urine? N   Do you have problems with loss of bowel control? N   Managing your Medications? N   Managing your Finances? N   Housekeeping or managing your Housekeeping? N     Patient Care Team: Laurey Morale, MD as PCP - General Kipp Brood Mariam Dollar, [facility] as Pharmacist (Pharmacist)  Indicate any recent Medical Services you may have received from other than [facility] providers in the past year (date may be approximate).     Assessment:   This is a routine wellness examination for Steven [name].  Hearing/Vision screen Hearing Screening - Comments:: No hearing difficulty Vision Screening - Comments:: Wears glasses. Followed by Dr Matilde Sprang  Dietary issues and exercise activities discussed: Exercise limited by: None identified   Goals Addressed               This Visit's Progress     No current goals (pt-stated)         Depression Screen    07/31/2021    8:26 AM 05/15/2021    9:16 AM 12/18/2020    8:09 AM 07/29/2020    9:08 AM 07/29/2020    9:06 AM 01/16/2020    9:16 AM 11/14/2019   10:14 AM  PHQ 2/9 Scores  PHQ - 2 Score 0 0 0 0 0 0 0  PHQ- 9 Score  0 2         Fall Risk    07/31/2021    8:29 AM 05/15/2021    9:16 AM 12/18/2020    8:09 AM 07/29/2020    9:08 AM 01/16/2020    9:16 AM  Fall Risk   Falls in the past year? 0 0 0 0 0  Number falls in past yr: 0 0 0 0   Injury with Fall? 0 0 0 0   Risk for fall due to : No Fall Risks No Fall Risks No Fall Risks    Follow up  Falls evaluation completed  Falls evaluation completed     [place]:  Any stairs in or around the home? Yes  If so, are there any without handrails? No  Home free of loose throw rugs in walkways, pet beds, electrical cords, etc? Yes  Adequate lighting in your home to reduce risk of falls? Yes   ASSISTIVE DEVICES UTILIZED TO PREVENT FALLS:  Life alert? No  Use of a cane, walker or w/c? No  Grab bars in the bathroom? Yes  Shower chair or bench in shower? No  Elevated toilet seat or a handicapped toilet? No   TIMED UP AND GO:  Was the test performed? No . Audio Visit  Cognitive Function:      Immunizations Immunization History  Administered Date(s) Administered   Fluad Quad(high Dose 65+) 11/13/2018, 11/14/2019, 12/18/2020   Influenza Whole 10/24/2006, 10/11/2007   Influenza, High Dose Seasonal PF 11/04/2014, 10/07/2015, 11/03/2016, 12/21/2017   Influenza,inj,Quad PF,6+ Mos 09/06/2013   Pneumococcal Conjugate-13 09/06/2013   Pneumococcal Polysaccharide-23 04/14/2007, 05/18/2017   Td 03/12/2002      Flu Vaccine status: Up to date  Pneumococcal vaccine status: Up to date  Covid-19 vaccine status: Declined, Education has been provided regarding the importance of this vaccine but patient still declined. Advised may receive this vaccine at local pharmacy or Health Dept.or vaccine clinic. Aware to provide a copy of the vaccination record if obtained from local pharmacy or Health Dept. Verbalized acceptance and understanding.  Qualifies for Shingles Vaccine?   Zostavax completed No   Shingrix Completed?: No.    Education has  been provided regarding the importance of this vaccine. Patient has been advised to call insurance company to determine out of pocket expense if they have not yet received this vaccine. Advised may also receive vaccine at local pharmacy or Health Dept. Verbalized acceptance and understanding. No  Screening Tests Health Maintenance  Topic Date Due   Zoster Vaccines- Shingrix (1 of 2) 10/31/2021 (Originally 10/31/1998)   Hepatitis C Screening  08/01/2022 (Originally 10/31/1968)   TETANUS/TDAP  01/11/2023 (Originally 03/11/2012)   INFLUENZA VACCINE  08/11/2021   COLONOSCOPY (Pts 45-38yr Insurance coverage will need to be confirmed)  02/11/2028   Pneumonia Vaccine 71 Years old  Completed   HPV VACCINES  Aged Out   COVID-19 Vaccine  Discontinued    Health Maintenance  There are no preventive care reminders to display for this patient.   Colorectal cancer screening: Type of screening: Colonoscopy. Completed 02/10/21. Repeat every 7 years  Lung Cancer Screening: (Low Dose CT Chest recommended if Age 73-80years, 30 pack-year currently smoking OR have quit w/in 15years.) does not qualify.     Additional Screening:  Hepatitis C Screening: does qualify; Completed Patient deferred  Vision Screening: Recommended annual ophthalmology exams for early detection of glaucoma and other disorders of the eye. Is the patient up to date with their annual eye exam?  Yes  Who is the provider or what is the name of the office in which the patient attends annual eye exams? Dr NMatilde SprangIf pt is not established with a provider, would they like to be referred to a provider to establish care? No .   Dental Screening: Recommended annual dental exams for proper oral hygiene  Community Resource Referral / Chronic Care Management:   CRR required this visit?  No   CCM required this visit?  No      Plan:     I have personally reviewed and noted the following in the patient's chart:   Medical and social  history Use of alcohol, tobacco or illicit drugs  Current medications and supplements including opioid prescriptions. Patient is not currently taking opioid prescriptions. Functional ability and status Nutritional status Physical activity Advanced directives List of other physicians Hospitalizations, surgeries, and ER visits in previous 12 months Vitals Screenings to include cognitive, depression, and falls Referrals and appointments  In addition, I have reviewed and discussed with patient certain preventive protocols, quality metrics, and best practice recommendations. A written personalized care plan for preventive services as well as general preventive health recommendations were provided to patient.     BCriselda Peaches LPN   [id number]  Nurse Notes: None

## 2021-07-31 NOTE — Patient Instructions (Addendum)
Mr. Steven [name] , Thank you for taking time to come for your Medicare Wellness Visit. I appreciate your ongoing commitment to your health goals. Please review the following plan we discussed and let me know if I can assist you in the future.   These are the goals we discussed:  Goals       No current goals (pt-stated)      Patient Stated      Stay healthy and continue to enjoy your life         This is a list of the screening recommended for you and due dates:  Health Maintenance  Topic Date Due   Zoster (Shingles) Vaccine (1 of 2) 10/31/2021*   Hepatitis C Screening: USPSTF Recommendation to screen - Ages 18-79 yo.  08/01/2022*   Tetanus Vaccine  01/11/2023*   Flu Shot  08/11/2021   Colon Cancer Screening  02/11/2028   Pneumonia Vaccine  Completed   HPV Vaccine  Aged Out   COVID-19 Vaccine  Discontinued  *Topic was postponed. The date shown is not the original due date.   Advanced directives: Yes  Conditions/risks identified: None  Next appointment: Follow up in one year for your annual wellness visit.   Preventive Care 4 Years and Older, Male Preventive care refers to lifestyle choices and visits with your health care provider that can promote health and wellness. What does preventive care include? A yearly physical exam. This is also called an annual well check. Dental exams once or twice a year. Routine eye exams. Ask your health care provider how often you should have your eyes checked. Personal lifestyle choices, including: Daily care of your teeth and gums. Regular physical activity. Eating a healthy diet. Avoiding tobacco and drug use. Limiting alcohol use. Practicing safe sex. Taking low doses of aspirin every day. Taking vitamin and mineral supplements as recommended by your health care provider. What happens during an annual well check? The services and screenings done by your health care provider during your annual well check will depend on your age, overall  health, lifestyle risk factors, and family history of disease. Counseling  Your health care provider may ask you questions about your: Alcohol use. Tobacco use. Drug use. Emotional well-being. Home and relationship well-being. Sexual activity. Eating habits. History of falls. Memory and ability to understand (cognition). Work and work Statistician. Screening  You may have the following tests or measurements: Height, weight, and BMI. Blood pressure. Lipid and cholesterol levels. These may be checked every 5 years, or more frequently if you are over 24 years old. Skin check. Lung cancer screening. You may have this screening every year starting at age 30 if you have a 30-pack-year history of smoking and currently smoke or have quit within the past 15 years. Fecal occult blood test (FOBT) of the stool. You may have this test every year starting at age 85. Flexible sigmoidoscopy or colonoscopy. You may have a sigmoidoscopy every 5 years or a colonoscopy every 10 years starting at age 51. Prostate cancer screening. Recommendations will vary depending on your family history and other risks. Hepatitis C blood test. Hepatitis B blood test. Sexually transmitted disease (STD) testing. Diabetes screening. This is done by checking your blood sugar (glucose) after you have not eaten for a while (fasting). You may have this done every 1-3 years. Abdominal aortic aneurysm (AAA) screening. You may need this if you are a current or former smoker. Osteoporosis. You may be screened starting at age 52 if you are  at high risk. Talk with your health care provider about your test results, treatment options, and if necessary, the need for more tests. Vaccines  Your health care provider may recommend certain vaccines, such as: Influenza vaccine. This is recommended every year. Tetanus, diphtheria, and acellular pertussis (Tdap, Td) vaccine. You may need a Td booster every 10 years. Zoster vaccine. You may  need this after age 17. Pneumococcal 13-valent conjugate (PCV13) vaccine. One dose is recommended after age 72. Pneumococcal polysaccharide (PPSV23) vaccine. One dose is recommended after age 8. Talk to your health care provider about which screenings and vaccines you need and how often you need them. This information is not intended to replace advice given to you by your health care provider. Make sure you discuss any questions you have with your health care provider. Document Released: 01/24/2015 Document Revised: 09/17/2015 Document Reviewed: 10/29/2014 Elsevier Interactive Patient Education  2017 [place] Prevention in the Home Falls can cause injuries. They can happen to people of all ages. There are many things you can do to make your home safe and to help prevent falls. What can I do on the outside of my home? Regularly fix the edges of walkways and driveways and fix any cracks. Remove anything that might make you trip as you walk through a door, such as a raised step or threshold. Trim any bushes or trees on the path to your home. Use bright outdoor lighting. Clear any walking paths of anything that might make someone trip, such as rocks or tools. Regularly check to see if handrails are loose or broken. Make sure that both sides of any steps have handrails. Any raised decks and porches should have guardrails on the edges. Have any leaves, snow, or ice cleared regularly. Use sand or salt on walking paths during winter. Clean up any spills in your garage right away. This includes oil or grease spills. What can I do in the bathroom? Use night lights. Install grab bars by the toilet and in the tub and shower. Do not use towel bars as grab bars. Use non-skid mats or decals in the tub or shower. If you need to sit down in the shower, use a plastic, non-slip stool. Keep the floor dry. Clean up any water that spills on the floor as soon as it happens. Remove soap buildup in  the tub or shower regularly. Attach bath mats securely with double-sided non-slip rug tape. Do not have throw rugs and other things on the floor that can make you trip. What can I do in the bedroom? Use night lights. Make sure that you have a light by your bed that is easy to reach. Do not use any sheets or blankets that are too big for your bed. They should not hang down onto the floor. Have a firm chair that has side arms. You can use this for support while you get dressed. Do not have throw rugs and other things on the floor that can make you trip. What can I do in the kitchen? Clean up any spills right away. Avoid walking on wet floors. Keep items that you use a lot in easy-to-reach places. If you need to reach something above you, use a strong step stool that has a grab bar. Keep electrical cords out of the way. Do not use floor polish or wax that makes floors slippery. If you must use wax, use non-skid floor wax. Do not have throw rugs and other things on the floor  that can make you trip. What can I do with my stairs? Do not leave any items on the stairs. Make sure that there are handrails on both sides of the stairs and use them. Fix handrails that are broken or loose. Make sure that handrails are as long as the stairways. Check any carpeting to make sure that it is firmly attached to the stairs. Fix any carpet that is loose or worn. Avoid having throw rugs at the top or bottom of the stairs. If you do have throw rugs, attach them to the floor with carpet tape. Make sure that you have a light switch at the top of the stairs and the bottom of the stairs. If you do not have them, ask someone to add them for you. What else can I do to help prevent falls? Wear shoes that: Do not have high heels. Have rubber bottoms. Are comfortable and fit you well. Are closed at the toe. Do not wear sandals. If you use a stepladder: Make sure that it is fully opened. Do not climb a closed  stepladder. Make sure that both sides of the stepladder are locked into place. Ask someone to hold it for you, if possible. Clearly mark and make sure that you can see: Any grab bars or handrails. First and last steps. Where the edge of each step is. Use tools that help you move around (mobility aids) if they are needed. These include: Canes. Walkers. Scooters. Crutches. Turn on the lights when you go into a dark area. Replace any light bulbs as soon as they burn out. Set up your furniture so you have a clear path. Avoid moving your furniture around. If any of your floors are uneven, fix them. If there are any pets around you, be aware of where they are. Review your medicines with your doctor. Some medicines can make you feel dizzy. This can increase your chance of falling. Ask your doctor what other things that you can do to help prevent falls. This information is not intended to replace advice given to you by your health care provider. Make sure you discuss any questions you have with your health care provider. Document Released: 10/24/2008 Document Revised: 06/05/2015 Document Reviewed: 02/01/2014 Elsevier Interactive Patient Education  2017 [employer].

## 2021-08-03 DIAGNOSIS — M25511 Pain in right shoulder: Secondary | ICD-10-CM | POA: Diagnosis not present

## 2021-08-03 DIAGNOSIS — M25611 Stiffness of right shoulder, not elsewhere classified: Secondary | ICD-10-CM | POA: Diagnosis not present

## 2021-08-07 DIAGNOSIS — M25611 Stiffness of right shoulder, not elsewhere classified: Secondary | ICD-10-CM | POA: Diagnosis not present

## 2021-08-07 DIAGNOSIS — M25511 Pain in right shoulder: Secondary | ICD-10-CM | POA: Diagnosis not present

## 2021-08-10 DIAGNOSIS — M25611 Stiffness of right shoulder, not elsewhere classified: Secondary | ICD-10-CM | POA: Diagnosis not present

## 2021-08-10 DIAGNOSIS — M25511 Pain in right shoulder: Secondary | ICD-10-CM | POA: Diagnosis not present

## 2021-08-13 DIAGNOSIS — M79642 Pain in left hand: Secondary | ICD-10-CM | POA: Diagnosis not present

## 2021-08-13 DIAGNOSIS — M19041 Primary osteoarthritis, right hand: Secondary | ICD-10-CM | POA: Diagnosis not present

## 2021-08-13 DIAGNOSIS — M7541 Impingement syndrome of right shoulder: Secondary | ICD-10-CM | POA: Diagnosis not present

## 2021-08-14 DIAGNOSIS — R600 Localized edema: Secondary | ICD-10-CM | POA: Diagnosis not present

## 2021-08-14 DIAGNOSIS — M25511 Pain in right shoulder: Secondary | ICD-10-CM | POA: Diagnosis not present

## 2021-08-14 DIAGNOSIS — M25611 Stiffness of right shoulder, not elsewhere classified: Secondary | ICD-10-CM | POA: Diagnosis not present

## 2021-08-17 DIAGNOSIS — M25611 Stiffness of right shoulder, not elsewhere classified: Secondary | ICD-10-CM | POA: Diagnosis not present

## 2021-08-17 DIAGNOSIS — M25511 Pain in right shoulder: Secondary | ICD-10-CM | POA: Diagnosis not present

## 2021-08-24 DIAGNOSIS — M25511 Pain in right shoulder: Secondary | ICD-10-CM | POA: Diagnosis not present

## 2021-08-24 DIAGNOSIS — M25611 Stiffness of right shoulder, not elsewhere classified: Secondary | ICD-10-CM | POA: Diagnosis not present

## 2021-08-28 DIAGNOSIS — M25611 Stiffness of right shoulder, not elsewhere classified: Secondary | ICD-10-CM | POA: Diagnosis not present

## 2021-08-28 DIAGNOSIS — M25511 Pain in right shoulder: Secondary | ICD-10-CM | POA: Diagnosis not present

## 2021-09-01 DIAGNOSIS — Z4789 Encounter for other orthopedic aftercare: Secondary | ICD-10-CM | POA: Diagnosis not present

## 2021-09-11 DIAGNOSIS — M25611 Stiffness of right shoulder, not elsewhere classified: Secondary | ICD-10-CM | POA: Diagnosis not present

## 2021-09-11 DIAGNOSIS — N201 Calculus of ureter: Secondary | ICD-10-CM | POA: Diagnosis not present

## 2021-09-11 DIAGNOSIS — M25511 Pain in right shoulder: Secondary | ICD-10-CM | POA: Diagnosis not present

## 2021-09-11 DIAGNOSIS — M160 Bilateral primary osteoarthritis of hip: Secondary | ICD-10-CM | POA: Diagnosis not present

## 2021-09-11 DIAGNOSIS — K5730 Diverticulosis of large intestine without perforation or abscess without bleeding: Secondary | ICD-10-CM | POA: Diagnosis not present

## 2021-09-11 DIAGNOSIS — R310 Gross hematuria: Secondary | ICD-10-CM | POA: Diagnosis not present

## 2021-09-11 DIAGNOSIS — K760 Fatty (change of) liver, not elsewhere classified: Secondary | ICD-10-CM | POA: Diagnosis not present

## 2021-09-11 DIAGNOSIS — N132 Hydronephrosis with renal and ureteral calculous obstruction: Secondary | ICD-10-CM | POA: Diagnosis not present

## 2021-09-16 DIAGNOSIS — M25511 Pain in right shoulder: Secondary | ICD-10-CM | POA: Diagnosis not present

## 2021-09-16 DIAGNOSIS — M25611 Stiffness of right shoulder, not elsewhere classified: Secondary | ICD-10-CM | POA: Diagnosis not present

## 2021-09-18 DIAGNOSIS — M25611 Stiffness of right shoulder, not elsewhere classified: Secondary | ICD-10-CM | POA: Diagnosis not present

## 2021-09-18 DIAGNOSIS — M25511 Pain in right shoulder: Secondary | ICD-10-CM | POA: Diagnosis not present

## 2021-09-21 DIAGNOSIS — M25511 Pain in right shoulder: Secondary | ICD-10-CM | POA: Diagnosis not present

## 2021-09-21 DIAGNOSIS — M25611 Stiffness of right shoulder, not elsewhere classified: Secondary | ICD-10-CM | POA: Diagnosis not present

## 2021-09-28 DIAGNOSIS — M25611 Stiffness of right shoulder, not elsewhere classified: Secondary | ICD-10-CM | POA: Diagnosis not present

## 2021-09-28 DIAGNOSIS — N201 Calculus of ureter: Secondary | ICD-10-CM | POA: Diagnosis not present

## 2021-09-28 DIAGNOSIS — R310 Gross hematuria: Secondary | ICD-10-CM | POA: Diagnosis not present

## 2021-09-28 DIAGNOSIS — M25511 Pain in right shoulder: Secondary | ICD-10-CM | POA: Diagnosis not present

## 2021-10-02 DIAGNOSIS — M25511 Pain in right shoulder: Secondary | ICD-10-CM | POA: Diagnosis not present

## 2021-10-02 DIAGNOSIS — M25611 Stiffness of right shoulder, not elsewhere classified: Secondary | ICD-10-CM | POA: Diagnosis not present

## 2021-10-05 DIAGNOSIS — M25511 Pain in right shoulder: Secondary | ICD-10-CM | POA: Diagnosis not present

## 2021-10-05 DIAGNOSIS — M25611 Stiffness of right shoulder, not elsewhere classified: Secondary | ICD-10-CM | POA: Diagnosis not present

## 2021-10-07 ENCOUNTER — Telehealth: Payer: Self-pay | Admitting: Family Medicine

## 2021-10-07 DIAGNOSIS — I10 Essential (primary) hypertension: Secondary | ICD-10-CM

## 2021-10-07 NOTE — Telephone Encounter (Signed)
Pt wife call and need a refill on losartan (COZAAR) 50 MG tablet  sent to [facility] mail order.

## 2021-10-08 MED ORDER — LOSARTAN POTASSIUM 50 MG PO TABS: 50.0000 mg | ORAL_TABLET | Freq: Every day | ORAL | 0 refills | Status: DC

## 2021-10-08 NOTE — Telephone Encounter (Signed)
Refill for Losartan sent to [facility].

## 2021-10-13 DIAGNOSIS — Z4789 Encounter for other orthopedic aftercare: Secondary | ICD-10-CM | POA: Diagnosis not present

## 2021-11-25 ENCOUNTER — Encounter: Payer: PPO | Admitting: Family Medicine

## 2021-11-25 DIAGNOSIS — H11042 Peripheral pterygium, stationary, left eye: Secondary | ICD-10-CM | POA: Diagnosis not present

## 2021-11-25 DIAGNOSIS — H40013 Open angle with borderline findings, low risk, bilateral: Secondary | ICD-10-CM | POA: Diagnosis not present

## 2021-11-25 DIAGNOSIS — D3101 Benign neoplasm of right conjunctiva: Secondary | ICD-10-CM | POA: Diagnosis not present

## 2021-11-25 DIAGNOSIS — H2513 Age-related nuclear cataract, bilateral: Secondary | ICD-10-CM | POA: Diagnosis not present

## 2021-11-25 DIAGNOSIS — H43812 Vitreous degeneration, left eye: Secondary | ICD-10-CM | POA: Diagnosis not present

## 2021-11-25 DIAGNOSIS — G43809 Other migraine, not intractable, without status migrainosus: Secondary | ICD-10-CM | POA: Diagnosis not present

## 2021-12-15 ENCOUNTER — Ambulatory Visit (INDEPENDENT_AMBULATORY_CARE_PROVIDER_SITE_OTHER): Payer: PPO | Admitting: Family Medicine

## 2021-12-15 ENCOUNTER — Other Ambulatory Visit: Payer: Self-pay

## 2021-12-15 ENCOUNTER — Encounter: Payer: Self-pay | Admitting: Family Medicine

## 2021-12-15 VITALS — BP 120/78 | HR 61 | Temp 98.1°F | Wt 191.0 lb

## 2021-12-15 DIAGNOSIS — R067 Sneezing: Secondary | ICD-10-CM

## 2021-12-15 DIAGNOSIS — J019 Acute sinusitis, unspecified: Secondary | ICD-10-CM

## 2021-12-15 DIAGNOSIS — R059 Cough, unspecified: Secondary | ICD-10-CM | POA: Diagnosis not present

## 2021-12-15 DIAGNOSIS — I10 Essential (primary) hypertension: Secondary | ICD-10-CM

## 2021-12-15 LAB — POCT INFLUENZA A/B
Influenza A, POC: NEGATIVE
Influenza B, POC: NEGATIVE

## 2021-12-15 LAB — POC COVID19 BINAXNOW: SARS Coronavirus 2 Ag: NEGATIVE

## 2021-12-15 MED ORDER — CLARITHROMYCIN 500 MG PO TABS: 500.0000 mg | ORAL_TABLET | Freq: Two times a day (BID) | ORAL | 0 refills | Status: DC

## 2021-12-15 MED ORDER — METHYLPREDNISOLONE ACETATE 80 MG/ML IJ SUSP
80.0000 mg | Freq: Once | INTRAMUSCULAR | Status: AC
  Administered 2021-12-15: 80 mg via INTRAMUSCULAR

## 2021-12-15 MED ORDER — METHYLPREDNISOLONE ACETATE 40 MG/ML IJ SUSP
40.0000 mg | Freq: Once | INTRAMUSCULAR | Status: AC
  Administered 2021-12-15: 40 mg via INTRAMUSCULAR

## 2021-12-15 NOTE — Progress Notes (Signed)
   Subjective:    Patient ID: Steven [name], male    DOB: 11/16/1950, 71 y.o.   MRN: [id number]  HPI Here for 5 days of sinus pressure, PND, blowing yellow mucus from the nose, and a dry cough. He has had low grade fevers. No SOB or body aches. Taking Mucinex.   Review of Systems  Constitutional:  Positive for fever.  HENT:  Positive for congestion, postnasal drip and sinus pressure. Negative for ear pain and sore throat.   Eyes: Negative.   Respiratory:  Positive for cough. Negative for shortness of breath and wheezing.        Objective:   Physical Exam Constitutional:      General: He is not in acute distress.    Appearance: Normal appearance.  HENT:     Right Ear: Tympanic membrane, ear canal and external ear normal.     Left Ear: Tympanic membrane, ear canal and external ear normal.     Nose: Nose normal.     Mouth/Throat:     Pharynx: Oropharynx is clear.  Eyes:     Conjunctiva/sclera: Conjunctivae normal.  Pulmonary:     Effort: Pulmonary effort is normal.     Breath sounds: Normal breath sounds.  Lymphadenopathy:     Cervical: No cervical adenopathy.  Neurological:     Mental Status: He is alert.           Assessment & Plan:  Sinusitis, treat with 10 days of Clarithromycin. Given a DepoMedrol shot.  Alysia Penna, MD

## 2021-12-28 ENCOUNTER — Other Ambulatory Visit: Payer: Self-pay

## 2021-12-28 DIAGNOSIS — I10 Essential (primary) hypertension: Secondary | ICD-10-CM

## 2021-12-28 MED ORDER — LOSARTAN POTASSIUM 50 MG PO TABS: 50.0000 mg | ORAL_TABLET | Freq: Every day | ORAL | 0 refills | Status: DC

## 2021-12-30 ENCOUNTER — Ambulatory Visit (INDEPENDENT_AMBULATORY_CARE_PROVIDER_SITE_OTHER): Payer: PPO | Admitting: Family Medicine

## 2021-12-30 ENCOUNTER — Encounter: Payer: Self-pay | Admitting: Family Medicine

## 2021-12-30 VITALS — BP 118/78 | HR 60 | Temp 98.1°F | Ht 69.5 in | Wt 190.4 lb

## 2021-12-30 DIAGNOSIS — Z0000 Encounter for general adult medical examination without abnormal findings: Secondary | ICD-10-CM | POA: Diagnosis not present

## 2021-12-30 LAB — URINALYSIS
Bilirubin Urine: NEGATIVE
Hgb urine dipstick: NEGATIVE
Ketones, ur: NEGATIVE
Leukocytes,Ua: NEGATIVE
Nitrite: NEGATIVE
Specific Gravity, Urine: 1.015 (ref 1.000–1.030)
Total Protein, Urine: NEGATIVE
Urine Glucose: NEGATIVE
Urobilinogen, UA: 0.2 (ref 0.0–1.0)
pH: 7 (ref 5.0–8.0)

## 2021-12-30 LAB — HEPATIC FUNCTION PANEL
ALT: 38 U/L (ref 0–53)
AST: 26 U/L (ref 0–37)
Albumin: 4.6 g/dL (ref 3.5–5.2)
Alkaline Phosphatase: 71 U/L (ref 39–117)
Bilirubin, Direct: 0.2 mg/dL (ref 0.0–0.3)
Total Bilirubin: 0.8 mg/dL (ref 0.2–1.2)
Total Protein: 7.1 g/dL (ref 6.0–8.3)

## 2021-12-30 LAB — CBC WITH DIFFERENTIAL/PLATELET
Basophils Absolute: 0.1 10*3/uL (ref 0.0–0.1)
Basophils Relative: 0.8 % (ref 0.0–3.0)
Eosinophils Absolute: 0.1 10*3/uL (ref 0.0–0.7)
Eosinophils Relative: 1.9 % (ref 0.0–5.0)
HCT: 46.2 % (ref 39.0–52.0)
Hemoglobin: 15.8 g/dL (ref 13.0–17.0)
Lymphocytes Relative: 25.5 % (ref 12.0–46.0)
Lymphs Abs: 1.7 10*3/uL (ref 0.7–4.0)
MCHC: 34.1 g/dL (ref 30.0–36.0)
MCV: 88.6 fl (ref 78.0–100.0)
Monocytes Absolute: 0.5 10*3/uL (ref 0.1–1.0)
Monocytes Relative: 7.3 % (ref 3.0–12.0)
Neutro Abs: 4.4 10*3/uL (ref 1.4–7.7)
Neutrophils Relative %: 64.5 % (ref 43.0–77.0)
Platelets: 253 10*3/uL (ref 150.0–400.0)
RBC: 5.21 Mil/uL (ref 4.22–5.81)
RDW: 13.8 % (ref 11.5–15.5)
WBC: 6.8 10*3/uL (ref 4.0–10.5)

## 2021-12-30 LAB — LIPID PANEL
Cholesterol: 192 mg/dL (ref 0–200)
HDL: 54.4 mg/dL (ref >39.00)
LDL Cholesterol: 107 mg/dL — ABNORMAL HIGH (ref 0–99)
NonHDL: 138
Total CHOL/HDL Ratio: 4
Triglycerides: 153 mg/dL — ABNORMAL HIGH (ref 0.0–149.0)
VLDL: 30.6 mg/dL (ref 0.0–40.0)

## 2021-12-30 LAB — BASIC METABOLIC PANEL
BUN: 18 mg/dL (ref 6–23)
CO2: 31 mEq/L (ref 19–32)
Calcium: 9.6 mg/dL (ref 8.4–10.5)
Chloride: 102 mEq/L (ref 96–112)
Creatinine, Ser: 1.36 mg/dL (ref 0.40–1.50)
GFR: 51.75 mL/min — ABNORMAL LOW (ref >60.00)
Glucose, Bld: 75 mg/dL (ref 70–99)
Potassium: 4.4 mEq/L (ref 3.5–5.1)
Sodium: 141 mEq/L (ref 135–145)

## 2021-12-30 LAB — HEMOGLOBIN A1C: Hgb A1c MFr Bld: 5.7 % (ref 4.6–6.5)

## 2021-12-30 LAB — PSA: PSA: 0.71 ng/mL (ref 0.10–4.00)

## 2021-12-30 LAB — TSH: TSH: 1.61 u[IU]/mL (ref 0.35–5.50)

## 2021-12-30 MED ORDER — TEMAZEPAM 30 MG PO CAPS: 30.0000 mg | ORAL_CAPSULE | Freq: Every evening | ORAL | 1 refills | Status: DC | PRN

## 2021-12-30 NOTE — Progress Notes (Signed)
Subjective:    Patient ID: Steven [name], male    DOB: [birth year], 71 y.o.   MRN: [id number]  HPI Here for a well exam. He feels good in general. He was here recently for a sinus infection, and this has resolved after taking a course of Biaxin. He is fairly sure he passed another small kidney stone about a week ago. He had no pain but he sensed something passing through his urethra as he was urinating.    Review of Systems  Constitutional: Negative.   HENT: Negative.    Eyes: Negative.   Respiratory: Negative.    Cardiovascular: Negative.   Gastrointestinal: Negative.   Genitourinary: Negative.   Musculoskeletal: Negative.   Skin: Negative.   Neurological: Negative.   Psychiatric/Behavioral: Negative.         Objective:   Physical Exam Constitutional:      General: He is not in acute distress.    Appearance: Normal appearance. He is well-developed. He is not diaphoretic.  HENT:     Head: Normocephalic and atraumatic.     Right Ear: External ear normal.     Left Ear: External ear normal.     Nose: Nose normal.     Mouth/Throat:     Pharynx: No oropharyngeal exudate.  Eyes:     General: No scleral icterus.       Right eye: No discharge.        Left eye: No discharge.     Conjunctiva/sclera: Conjunctivae normal.     Pupils: Pupils are equal, round, and reactive to light.  Neck:     Thyroid: No thyromegaly.     Vascular: No JVD.     Trachea: No tracheal deviation.  Cardiovascular:     Rate and Rhythm: Normal rate and regular rhythm.     Heart sounds: Normal heart sounds. No murmur heard.    No friction rub. No gallop.  Pulmonary:     Effort: Pulmonary effort is normal. No respiratory distress.     Breath sounds: Normal breath sounds. No wheezing or rales.  Chest:     Chest wall: No tenderness.  Abdominal:     General: Bowel sounds are normal. There is no distension.     Palpations: Abdomen is soft. There is no mass.     Tenderness: There is no abdominal  tenderness. There is no guarding or rebound.  Genitourinary:    Penis: Normal. No tenderness.      Testes: Normal.     Prostate: Normal.     Rectum: Normal. Guaiac result negative.  Musculoskeletal:        General: No tenderness. Normal range of motion.     Cervical back: Neck supple.  Lymphadenopathy:     Cervical: No cervical adenopathy.  Skin:    General: Skin is warm and dry.     Coloration: Skin is not pale.     Findings: No erythema or rash.  Neurological:     Mental Status: He is alert and oriented to person, place, and time.     Cranial Nerves: No cranial nerve deficit.     Motor: No abnormal muscle tone.     Coordination: Coordination normal.     Deep Tendon Reflexes: Reflexes are normal and symmetric. Reflexes normal.  Psychiatric:        Behavior: Behavior normal.        Thought Content: Thought content normal.        Judgment: Judgment normal.  Assessment & Plan:  Well exam. We discussed diet and exercise. Get fasting labs. Alysia Penna, MD

## 2022-02-11 DIAGNOSIS — L820 Inflamed seborrheic keratosis: Secondary | ICD-10-CM | POA: Diagnosis not present

## 2022-02-11 DIAGNOSIS — Z1283 Encounter for screening for malignant neoplasm of skin: Secondary | ICD-10-CM | POA: Diagnosis not present

## 2022-02-11 DIAGNOSIS — D225 Melanocytic nevi of trunk: Secondary | ICD-10-CM | POA: Diagnosis not present

## 2022-02-11 DIAGNOSIS — D485 Neoplasm of uncertain behavior of skin: Secondary | ICD-10-CM | POA: Diagnosis not present

## 2022-03-04 DIAGNOSIS — R310 Gross hematuria: Secondary | ICD-10-CM | POA: Diagnosis not present

## 2022-03-04 DIAGNOSIS — N200 Calculus of kidney: Secondary | ICD-10-CM | POA: Diagnosis not present

## 2022-04-12 ENCOUNTER — Telehealth: Payer: Self-pay | Admitting: Family Medicine

## 2022-04-12 DIAGNOSIS — I10 Essential (primary) hypertension: Secondary | ICD-10-CM

## 2022-04-12 MED ORDER — LOSARTAN POTASSIUM 50 MG PO TABS: 50.0000 mg | ORAL_TABLET | Freq: Every day | ORAL | 3 refills | Status: DC

## 2022-04-12 NOTE — Telephone Encounter (Signed)
Done

## 2022-05-10 DIAGNOSIS — M545 Low back pain, unspecified: Secondary | ICD-10-CM | POA: Diagnosis not present

## 2022-05-19 DIAGNOSIS — Z4789 Encounter for other orthopedic aftercare: Secondary | ICD-10-CM | POA: Diagnosis not present

## 2022-05-21 DIAGNOSIS — M5136 Other intervertebral disc degeneration, lumbar region: Secondary | ICD-10-CM | POA: Diagnosis not present

## 2022-05-21 DIAGNOSIS — M5451 Vertebrogenic low back pain: Secondary | ICD-10-CM | POA: Diagnosis not present

## 2022-06-08 DIAGNOSIS — M5459 Other low back pain: Secondary | ICD-10-CM | POA: Diagnosis not present

## 2022-06-08 DIAGNOSIS — M47896 Other spondylosis, lumbar region: Secondary | ICD-10-CM | POA: Diagnosis not present

## 2022-06-14 DIAGNOSIS — H43812 Vitreous degeneration, left eye: Secondary | ICD-10-CM | POA: Diagnosis not present

## 2022-06-14 DIAGNOSIS — G43809 Other migraine, not intractable, without status migrainosus: Secondary | ICD-10-CM | POA: Diagnosis not present

## 2022-06-14 DIAGNOSIS — H11042 Peripheral pterygium, stationary, left eye: Secondary | ICD-10-CM | POA: Diagnosis not present

## 2022-06-14 DIAGNOSIS — H40013 Open angle with borderline findings, low risk, bilateral: Secondary | ICD-10-CM | POA: Diagnosis not present

## 2022-06-14 DIAGNOSIS — D3101 Benign neoplasm of right conjunctiva: Secondary | ICD-10-CM | POA: Diagnosis not present

## 2022-06-14 DIAGNOSIS — H2513 Age-related nuclear cataract, bilateral: Secondary | ICD-10-CM | POA: Diagnosis not present

## 2022-06-17 DIAGNOSIS — R311 Benign essential microscopic hematuria: Secondary | ICD-10-CM | POA: Diagnosis not present

## 2022-06-17 DIAGNOSIS — R3912 Poor urinary stream: Secondary | ICD-10-CM | POA: Diagnosis not present

## 2022-06-17 DIAGNOSIS — N5201 Erectile dysfunction due to arterial insufficiency: Secondary | ICD-10-CM | POA: Diagnosis not present

## 2022-06-17 DIAGNOSIS — N401 Enlarged prostate with lower urinary tract symptoms: Secondary | ICD-10-CM | POA: Diagnosis not present

## 2022-07-01 DIAGNOSIS — M5459 Other low back pain: Secondary | ICD-10-CM | POA: Diagnosis not present

## 2022-07-01 DIAGNOSIS — M47896 Other spondylosis, lumbar region: Secondary | ICD-10-CM | POA: Diagnosis not present

## 2022-07-09 ENCOUNTER — Telehealth: Payer: Self-pay | Admitting: Family Medicine

## 2022-07-09 DIAGNOSIS — I82409 Acute embolism and thrombosis of unspecified deep veins of unspecified lower extremity: Secondary | ICD-10-CM

## 2022-07-09 MED ORDER — RIVAROXABAN 20 MG PO TABS: 20.0000 mg | ORAL_TABLET | Freq: Every day | ORAL | 3 refills | Status: AC

## 2022-07-09 NOTE — Telephone Encounter (Signed)
Done

## 2022-07-13 ENCOUNTER — Telehealth: Payer: Self-pay | Admitting: Family Medicine

## 2022-07-13 DIAGNOSIS — G8929 Other chronic pain: Secondary | ICD-10-CM

## 2022-07-13 NOTE — Telephone Encounter (Signed)
Requesting a referral for neurologist Dr Tressie Stalker [phone]

## 2022-07-16 DIAGNOSIS — G8929 Other chronic pain: Secondary | ICD-10-CM | POA: Insufficient documentation

## 2022-07-16 NOTE — Addendum Note (Signed)
Addended by: Gershon Crane A on: 07/16/2022 04:35 PM   Modules accepted: Orders

## 2022-07-16 NOTE — Telephone Encounter (Signed)
I did the referral to Dr. Lovell Sheehan

## 2022-07-19 NOTE — Telephone Encounter (Signed)
Pt notified via My Chart

## 2022-07-27 DIAGNOSIS — M5442 Lumbago with sciatica, left side: Secondary | ICD-10-CM | POA: Diagnosis not present

## 2022-07-27 DIAGNOSIS — M5441 Lumbago with sciatica, right side: Secondary | ICD-10-CM | POA: Diagnosis not present

## 2022-07-27 DIAGNOSIS — G8929 Other chronic pain: Secondary | ICD-10-CM | POA: Diagnosis not present

## 2022-08-05 DIAGNOSIS — M5442 Lumbago with sciatica, left side: Secondary | ICD-10-CM | POA: Diagnosis not present

## 2022-08-23 DIAGNOSIS — M79645 Pain in left finger(s): Secondary | ICD-10-CM | POA: Diagnosis not present

## 2022-08-23 DIAGNOSIS — M79644 Pain in right finger(s): Secondary | ICD-10-CM | POA: Diagnosis not present

## 2022-08-23 DIAGNOSIS — M65351 Trigger finger, right little finger: Secondary | ICD-10-CM | POA: Diagnosis not present

## 2022-09-01 DIAGNOSIS — M5416 Radiculopathy, lumbar region: Secondary | ICD-10-CM | POA: Diagnosis not present

## 2022-09-07 DIAGNOSIS — M5416 Radiculopathy, lumbar region: Secondary | ICD-10-CM | POA: Diagnosis not present

## 2022-10-01 ENCOUNTER — Telehealth: Payer: Self-pay | Admitting: Family Medicine

## 2022-10-01 ENCOUNTER — Other Ambulatory Visit: Payer: Self-pay

## 2022-10-01 MED ORDER — RABEPRAZOLE SODIUM 20 MG PO TBEC: 20.0000 mg | DELAYED_RELEASE_TABLET | Freq: Two times a day (BID) | ORAL | 3 refills | Status: DC

## 2022-10-01 NOTE — Telephone Encounter (Signed)
Prescription Request  10/01/2022  LOV: 12/30/2021  What is the name of the medication or equipment?  RABEprazole (ACIPHEX) 20 MG tablet  Have you contacted your pharmacy to request a refill? Yes   Which pharmacy would you like this sent to?  [facility], [place] - [address] [place] [address] [place] [address] Phone: [phone] Fax: [phone]   Says it should go to the [place] location, has no address Patient notified that their request is being sent to the clinical staff for review and that they should receive a response within 2 business days.   Please advise at Mobile [phone] (mobile)  pt reqeusting a call to confirm submission

## 2022-10-01 NOTE — Telephone Encounter (Signed)
Done

## 2022-10-11 ENCOUNTER — Other Ambulatory Visit: Payer: Self-pay

## 2022-10-11 ENCOUNTER — Ambulatory Visit (INDEPENDENT_AMBULATORY_CARE_PROVIDER_SITE_OTHER): Payer: PPO | Admitting: Family Medicine

## 2022-10-11 ENCOUNTER — Encounter: Payer: Self-pay | Admitting: Family Medicine

## 2022-10-11 VITALS — BP 124/78 | HR 65 | Temp 98.7°F | Wt 191.0 lb

## 2022-10-11 DIAGNOSIS — S76212A Strain of adductor muscle, fascia and tendon of left thigh, initial encounter: Secondary | ICD-10-CM | POA: Diagnosis not present

## 2022-10-11 DIAGNOSIS — E785 Hyperlipidemia, unspecified: Secondary | ICD-10-CM

## 2022-10-11 MED ORDER — LIDOCAINE VISCOUS HCL 2 % MT SOLN: 5.0000 mL | Freq: Four times a day (QID) | OROMUCOSAL | 11 refills | Status: DC | PRN

## 2022-10-11 MED ORDER — EZETIMIBE 10 MG PO TABS: 10.0000 mg | ORAL_TABLET | Freq: Every day | ORAL | 1 refills | Status: DC

## 2022-10-11 MED ORDER — FENOFIBRATE 145 MG PO TABS: 145.0000 mg | ORAL_TABLET | Freq: Every evening | ORAL | 1 refills | Status: DC

## 2022-10-11 NOTE — Progress Notes (Signed)
Subjective:    Patient ID: Steven [name], male    DOB: 10/10/1950, 72 y.o.   MRN: [id number]  HPI Here for one week of sharp pains in the left groin. No known trama hx but for the past past few weeks has been cutting down trees and clearing out underbrush to prepare for deer hunting.    Review of Systems  Constitutional: Negative.   Respiratory: Negative.    Cardiovascular: Negative.   Gastrointestinal: Negative.   Genitourinary: Negative.        Objective:   Physical Exam Constitutional:      Appearance: Normal appearance.  Cardiovascular:     Rate and Rhythm: Normal rate and regular rhythm.     Pulses: Normal pulses.     Heart sounds: Normal heart sounds.  Pulmonary:     Effort: Pulmonary effort is normal.     Breath sounds: Normal breath sounds.  Abdominal:     Hernia: No hernia is present.  Musculoskeletal:     Comments: He is tender over the proximal insertion of the left hip flexors   Neurological:     Mental Status: He is alert.           Assessment & Plan:  Hip flexor strain. He will take it easy and this should heal on its own over the next few weeks or so. Recheck as needed.  Gershon Crane, MD

## 2022-11-04 DIAGNOSIS — H2513 Age-related nuclear cataract, bilateral: Secondary | ICD-10-CM | POA: Diagnosis not present

## 2022-11-04 DIAGNOSIS — H11042 Peripheral pterygium, stationary, left eye: Secondary | ICD-10-CM | POA: Diagnosis not present

## 2022-11-04 DIAGNOSIS — H43812 Vitreous degeneration, left eye: Secondary | ICD-10-CM | POA: Diagnosis not present

## 2022-11-04 DIAGNOSIS — G43809 Other migraine, not intractable, without status migrainosus: Secondary | ICD-10-CM | POA: Diagnosis not present

## 2022-11-04 DIAGNOSIS — H40013 Open angle with borderline findings, low risk, bilateral: Secondary | ICD-10-CM | POA: Diagnosis not present

## 2022-11-04 DIAGNOSIS — D3101 Benign neoplasm of right conjunctiva: Secondary | ICD-10-CM | POA: Diagnosis not present

## 2022-11-25 ENCOUNTER — Telehealth: Payer: Self-pay | Admitting: Family Medicine

## 2022-11-25 NOTE — Telephone Encounter (Signed)
[facility] tech is calling and pt would like a new rx magic mouthwash with only  nystatin, hydrocortisone and benadryl  [facility] - [place], [place] - [address] Phone: [phone]  Fax: [phone]

## 2022-11-26 MED ORDER — NYSTATIN 100000 UNIT/ML MT SUSP: 5.0000 mL | Freq: Four times a day (QID) | OROMUCOSAL | 2 refills | Status: DC | PRN

## 2022-11-26 NOTE — Telephone Encounter (Signed)
Done

## 2022-12-06 ENCOUNTER — Telehealth: Payer: Self-pay | Admitting: Family Medicine

## 2022-12-06 ENCOUNTER — Ambulatory Visit: Payer: PPO | Admitting: Family Medicine

## 2022-12-06 NOTE — Telephone Encounter (Signed)
Spouse called to say Pt is in excruciating pain (lower back pain). MD has no availability today, nor do we have any available office visits available at this location. Spouse is asking if MD would please, please consider sending Rx for Prednisone or similar, for extreme back pain?  [facility] - [place], [place] - [address] Phone: [phone]  Fax: [phone]

## 2022-12-07 ENCOUNTER — Encounter: Payer: Self-pay | Admitting: Family Medicine

## 2022-12-07 ENCOUNTER — Ambulatory Visit (INDEPENDENT_AMBULATORY_CARE_PROVIDER_SITE_OTHER): Payer: PPO | Admitting: Family Medicine

## 2022-12-07 VITALS — BP 140/82 | HR 63 | Temp 98.2°F | Wt 193.0 lb

## 2022-12-07 DIAGNOSIS — M5432 Sciatica, left side: Secondary | ICD-10-CM

## 2022-12-07 MED ORDER — METHYLPREDNISOLONE ACETATE 40 MG/ML IJ SUSP
40.0000 mg | Freq: Once | INTRAMUSCULAR | Status: AC
  Administered 2022-12-07: 40 mg via INTRAMUSCULAR

## 2022-12-07 MED ORDER — METHYLPREDNISOLONE 4 MG PO TBPK: ORAL_TABLET | ORAL | 0 refills | Status: DC

## 2022-12-07 MED ORDER — METHYLPREDNISOLONE ACETATE 80 MG/ML IJ SUSP
80.0000 mg | Freq: Once | INTRAMUSCULAR | Status: AC
  Administered 2022-12-07: 80 mg via INTRAMUSCULAR

## 2022-12-07 NOTE — Progress Notes (Signed)
   Subjective:    Patient ID: Steven [name], male    DOB: 1950/06/30, 72 y.o.   MRN: [id number]  HPI Here for 3 days of severe left sided low back pain which radiates down the left leg. He was at home and he stooped over to pick up something when he had the sudden onset of sharp pain. He has known dis disease, and he sees Dr. Tressie Stalker and Dr. Ethelene Hal at [facility] for this. Dr. Ethelene Hal give him bilateral epidural steroid  injections in August. Over the past few days Jerolyn Shin has been applying heat, and taking Tizanidine and Tylenol.    Review of Systems  Constitutional: Negative.   Respiratory: Negative.    Cardiovascular: Negative.   Musculoskeletal:  Positive for back pain.       Objective:   Physical Exam Constitutional:      General: He is not in acute distress.    Appearance: Normal appearance.  Cardiovascular:     Rate and Rhythm: Normal rate and regular rhythm.     Pulses: Normal pulses.     Heart sounds: Normal heart sounds.  Pulmonary:     Effort: Pulmonary effort is normal.     Breath sounds: Normal breath sounds.  Musculoskeletal:     Comments: Tender along the left side of the lower back and the left sciatic notch. Spine has full ROM. SLR are negative   Neurological:     Mental Status: He is alert.           Assessment & Plan:  Sciatica. He is given a shot of DepoMedrol to be followed tomorrow with a Medrol dose pack. Continue Tizanidine and Tylenol. Recheck as needed.  Gershon Crane, MD

## 2022-12-07 NOTE — Addendum Note (Signed)
Addended by: Carola Rhine on: 12/07/2022 04:56 PM   Modules accepted: Orders

## 2022-12-07 NOTE — Telephone Encounter (Signed)
Pt has an appt today at 145 pm

## 2022-12-17 DIAGNOSIS — H40013 Open angle with borderline findings, low risk, bilateral: Secondary | ICD-10-CM | POA: Diagnosis not present

## 2022-12-17 DIAGNOSIS — H2513 Age-related nuclear cataract, bilateral: Secondary | ICD-10-CM | POA: Diagnosis not present

## 2022-12-17 DIAGNOSIS — H11042 Peripheral pterygium, stationary, left eye: Secondary | ICD-10-CM | POA: Diagnosis not present

## 2022-12-17 DIAGNOSIS — G43809 Other migraine, not intractable, without status migrainosus: Secondary | ICD-10-CM | POA: Diagnosis not present

## 2022-12-17 DIAGNOSIS — H43812 Vitreous degeneration, left eye: Secondary | ICD-10-CM | POA: Diagnosis not present

## 2022-12-17 DIAGNOSIS — D3101 Benign neoplasm of right conjunctiva: Secondary | ICD-10-CM | POA: Diagnosis not present

## 2022-12-23 DIAGNOSIS — L821 Other seborrheic keratosis: Secondary | ICD-10-CM | POA: Diagnosis not present

## 2022-12-23 DIAGNOSIS — B078 Other viral warts: Secondary | ICD-10-CM | POA: Diagnosis not present

## 2022-12-23 DIAGNOSIS — L570 Actinic keratosis: Secondary | ICD-10-CM | POA: Diagnosis not present

## 2022-12-23 DIAGNOSIS — L739 Follicular disorder, unspecified: Secondary | ICD-10-CM | POA: Diagnosis not present

## 2022-12-23 DIAGNOSIS — X32XXXD Exposure to sunlight, subsequent encounter: Secondary | ICD-10-CM | POA: Diagnosis not present

## 2022-12-23 DIAGNOSIS — D485 Neoplasm of uncertain behavior of skin: Secondary | ICD-10-CM | POA: Diagnosis not present

## 2022-12-23 DIAGNOSIS — D225 Melanocytic nevi of trunk: Secondary | ICD-10-CM | POA: Diagnosis not present

## 2023-01-19 ENCOUNTER — Encounter: Payer: PPO | Admitting: Family Medicine

## 2023-02-03 ENCOUNTER — Ambulatory Visit: Payer: Self-pay | Admitting: Family Medicine

## 2023-02-03 ENCOUNTER — Ambulatory Visit: Payer: Medicare HMO

## 2023-02-03 ENCOUNTER — Ambulatory Visit: Payer: Medicare HMO | Admitting: Adult Health

## 2023-02-03 ENCOUNTER — Encounter: Payer: Self-pay | Admitting: Adult Health

## 2023-02-03 VITALS — BP 120/70 | HR 72 | Temp 98.2°F | Ht 69.5 in | Wt 193.0 lb

## 2023-02-03 DIAGNOSIS — R3129 Other microscopic hematuria: Secondary | ICD-10-CM | POA: Diagnosis not present

## 2023-02-03 DIAGNOSIS — R109 Unspecified abdominal pain: Secondary | ICD-10-CM | POA: Diagnosis not present

## 2023-02-03 DIAGNOSIS — K5792 Diverticulitis of intestine, part unspecified, without perforation or abscess without bleeding: Secondary | ICD-10-CM | POA: Diagnosis not present

## 2023-02-03 DIAGNOSIS — R300 Dysuria: Secondary | ICD-10-CM | POA: Diagnosis not present

## 2023-02-03 DIAGNOSIS — I878 Other specified disorders of veins: Secondary | ICD-10-CM | POA: Diagnosis not present

## 2023-02-03 LAB — POCT URINALYSIS DIPSTICK
Bilirubin, UA: NEGATIVE
Blood, UA: POSITIVE
Glucose, UA: NEGATIVE
Ketones, UA: NEGATIVE
Leukocytes, UA: NEGATIVE
Nitrite, UA: NEGATIVE
Protein, UA: NEGATIVE
Spec Grav, UA: 1.01 (ref 1.010–1.025)
Urobilinogen, UA: 0.2 U/dL
pH, UA: 6 (ref 5.0–8.0)

## 2023-02-03 MED ORDER — CEFUROXIME AXETIL 500 MG PO TABS: 500.0000 mg | ORAL_TABLET | Freq: Two times a day (BID) | ORAL | 0 refills | Status: AC

## 2023-02-03 MED ORDER — METRONIDAZOLE 500 MG PO TABS: 500.0000 mg | ORAL_TABLET | Freq: Three times a day (TID) | ORAL | 0 refills | Status: AC

## 2023-02-03 NOTE — Telephone Encounter (Signed)
Noted  

## 2023-02-03 NOTE — Progress Notes (Signed)
Subjective:    Patient ID: Steven [name], male    DOB: 1950/04/03, 73 y.o.   MRN: [id number]  HPI  73 year old male who  has a past medical history of Arthritis, Clotting disorder (HCC), Displacement of intervertebral disc, site unspecified, without myelopathy, Esophageal reflux, Headache(784.0), Herpes simplex without mention of complication, Hiatal hernia, HTN (hypertension) (05/10/2012), Hyperlipemia, Hypertrophy (benign) of prostate, Intestinal infection due to other organism, not elsewhere classified, Kidney stones, Personal history of colonic polyps, Personal history of urinary calculi, Personal history of venous thrombosis and embolism, and Renal insufficiency.  He is a patient of Dr. Clent Ridges who I am seeing today for an acute issue.  Per patient report over the last 2 days he has had abdominal pain that starts mid left lower quadrant and radiates around to his left flank.  He has had loose stool but no diarrhea.  Has not had any fevers, chills, constipation or vomiting.  He has known history of diverticulitis.  His last diverticulitis flare was in 2021.  He also reports that for the last 4 days he has noticed blood  in his urine and dysuria;  at first the blood was red and then it turned darker as the week has gone on. He  He has known history of kidney stones and reports that he passed a small kidney stone about 2 weeks ago.  He is followed by urology and was last seen in June 2024.  His last KUB was done in February 2024 with no obvious stone.  He had a cystoscopy in February 2024 which was benign.   Review of Systems See HPI   Past Medical History:  Diagnosis Date   Arthritis    Clotting disorder (HCC)    Displacement of intervertebral disc, site unspecified, without myelopathy    Esophageal reflux    Headache(784.0)    sinus and migraines   Herpes simplex without mention of complication    Hiatal hernia    HTN (hypertension) 05/10/2012   Hyperlipemia    Hypertrophy (benign) of  prostate    Intestinal infection due to other organism, not elsewhere classified    Kidney stones    sees Dr. Mena Goes   Personal history of colonic polyps    Personal history of urinary calculi    Personal history of venous thrombosis and embolism    Renal insufficiency     Social History   Socioeconomic History   Marital status: Married    Spouse name: Not on file   Number of children: Not on file   Years of education: Not on file   Highest education level: Not on file  Occupational History   Occupation: Retired  Tobacco Use   Smoking status: Former    Current packs/day: 0.00    Average packs/day: 1.5 packs/day for 20.0 years (30.0 ttl pk-yrs)    Types: Cigarettes    Start date: 03/03/1953    Quit date: 03/03/1973    Years since quitting: 49.9    Passive exposure: Past   Smokeless tobacco: Never   Tobacco comments:    discussed AAA; did have CT of abd/ pelvis   Vaping Use   Vaping status: Never Used  Substance and Sexual Activity   Alcohol use: No    Alcohol/week: 0.0 standard drinks of alcohol   Drug use: No   Sexual activity: Not on file  Other Topics Concern   Not on file  Social History Narrative   Not on file   Social  Drivers of Health   Financial Resource Strain: Low Risk  (07/31/2021)   Overall Financial Resource Strain (CARDIA)    Difficulty of Paying Living Expenses: Not hard at all  Food Insecurity: No Food Insecurity (07/31/2021)   Hunger Vital Sign    Worried About Running Out of Food in the Last Year: Never true    Ran Out of Food in the Last Year: Never true  Transportation Needs: No Transportation Needs (07/31/2021)   PRAPARE - Administrator, Civil Service (Medical): No    Lack of Transportation (Non-Medical): No  Physical Activity: Insufficiently Active (07/31/2021)   Exercise Vital Sign    Days of Exercise per Week: 2 days    Minutes of Exercise per Session: 40 min  Stress: No Stress Concern Present (07/31/2021)   [employer]  of Occupational Health - Occupational Stress Questionnaire    Feeling of Stress : Not at all  Social Connections: Socially Integrated (07/31/2021)   Social Connection and Isolation Panel [NHANES]    Frequency of Communication with Friends and Family: More than three times a week    Frequency of Social Gatherings with Friends and Family: More than three times a week    Attends Religious Services: More than 4 times per year    Active Member of Clubs or Organizations: Not on file    Attends Banker Meetings: More than 4 times per year    Marital Status: Married  Catering manager Violence: Not At Risk (07/31/2021)   Humiliation, Afraid, Rape, and Kick questionnaire    Fear of Current or Ex-Partner: No    Emotionally Abused: No    Physically Abused: No    Sexually Abused: No    Past Surgical History:  Procedure Laterality Date   APPENDECTOMY     BOTOX INJECTION N/A 04/13/2012   Procedure: BOTOX INJECTION;  Surgeon: Rachael Fee, MD;  Location: [facility];  Service: Endoscopy;  Laterality: N/A;   CERVICAL DISC SURGERY  1996 and 1998   CHOLECYSTECTOMY     COLONOSCOPY  02/10/2021   per Dr. [place] polyps, widespread diverticula, repeat in 5 yrs   ESOPHAGEAL MANOMETRY N/A 02/26/2013   Procedure: ESOPHAGEAL MANOMETRY (EM);  Surgeon: Rachael Fee, MD;  Location: [facility];  Service: Endoscopy;  Laterality: N/A;   ESOPHAGEAL MANOMETRY N/A 11/03/2018   Procedure: ESOPHAGEAL MANOMETRY (EM);  Surgeon: Rachael Fee, MD;  Location: [facility];  Service: Endoscopy;  Laterality: N/A;   ESOPHAGOGASTRODUODENOSCOPY  02/10/2021   per Dr. Christella Hartigan, mod hiatal hernia, one ring was dilated   ESOPHAGOGASTRODUODENOSCOPY (EGD) WITH ESOPHAGEAL DILATION N/A 04/13/2012   Procedure: ESOPHAGOGASTRODUODENOSCOPY (EGD) WITH ESOPHAGEAL DILATION;  Surgeon: Rachael Fee, MD;  Location: [facility];  Service: Endoscopy;  Laterality: N/A;  possible botox injection   EXTRACORPOREAL  SHOCK WAVE LITHOTRIPSY Left 08/14/2020   Procedure: LEFT EXTRACORPOREAL SHOCK WAVE LITHOTRIPSY (ESWL);  Surgeon: Belva Agee, MD;  Location: [facility];  Service: Urology;  Laterality: Left;   FINGER SURGERY     3rd finger trigger bilateral    GALLBLADDER SURGERY     LITHOTRIPSY     REVERSE SHOULDER ARTHROPLASTY Right 06/05/2021   Procedure: REVERSE SHOULDER ARTHROPLASTY;  Surgeon: Beverely Low, MD;  Location: [facility];  Service: Orthopedics;  Laterality: Right;  with ISB   ROTATOR CUFF REPAIR Right 01/10/2012   per Dr. Malon Kindle, also repair torn biceps tendon    SHOULDER SURGERY Right    STERIOD INJECTION Left 07/27/2013  Procedure: STEROID INJECTION;  Surgeon: Verlee Rossetti, MD;  Location: [facility] OR;  Service: Orthopedics;  Laterality: Left;   TONSILLECTOMY     TRIGGER FINGER RELEASE Right 07/27/2013   Procedure: RIGHT RING FINGER A-1 RELEASE;  Surgeon: Verlee Rossetti, MD;  Location: [facility] OR;  Service: Orthopedics;  Laterality: Right;   URETERAL STENT PLACEMENT  07/2010   left ureter, per Dr. Mena Goes, for a stone     Family History  Problem Relation Age of Onset   Colon cancer Father    Heart attack Father    Heart disease Brother    Heart disease Brother    Esophageal cancer Neg Hx    Rectal cancer Neg Hx    Stomach cancer Neg Hx     Allergies  Allergen Reactions   Morphine Other (See Comments)    headache   Amitriptyline Hcl Other (See Comments)    Mood changes   Amoxicillin-Pot Clavulanate Nausea Only   Cyclobenzaprine Hcl     unknown   Levofloxacin Swelling   Lisinopril-Hydrochlorothiazide     depression    Current Outpatient Medications on File Prior to Visit  Medication Sig Dispense Refill   acetaminophen (TYLENOL) 650 MG CR tablet Take 650-1,300 mg by mouth every 8 (eight) hours as needed for pain.     Ascorbic Acid (VITAMIN C PO) Take by mouth.     Cholecalciferol (VITAMIN D) 50 MCG (2000 UT) tablet Take 2,000 Units by mouth daily.      clobetasol cream (TEMOVATE) 0.05 % Apply 1 application. topically 2 (two) times daily as needed for itching.     ezetimibe (ZETIA) 10 MG tablet Take 1 tablet (10 mg total) by mouth daily. 90 tablet 1   fenofibrate (TRICOR) 145 MG tablet Take 1 tablet (145 mg total) by mouth every evening. 90 tablet 1   fluticasone (FLONASE) 50 MCG/ACT nasal spray Place 1 spray into both nostrils daily as needed for allergies or rhinitis.     folic acid (FOLVITE) 800 MCG tablet Take 800 mcg by mouth daily.     loratadine (CLARITIN) 10 MG tablet Take 10 mg by mouth daily as needed for allergies.     losartan (COZAAR) 50 MG tablet Take 1 tablet (50 mg total) by mouth daily. 90 tablet 3   magic mouthwash (nystatin, hydrocortisone, diphenhydrAMINE) suspension Swish and spit 5 mLs 4 (four) times daily as needed for mouth pain. 540 mL 2   methocarbamol (ROBAXIN) 500 MG tablet Take 1 tablet (500 mg total) by mouth every 6 (six) hours as needed for muscle spasms. 40 tablet 1   methylPREDNISolone (MEDROL DOSEPAK) 4 MG TBPK tablet As directed 21 tablet 0   metoCLOPramide (REGLAN) 10 MG tablet Take 1 tablet (10 mg total) by mouth every 8 (eight) hours as needed (hiccups). 30 tablet 0   Multiple Vitamin (MULTIVITAMIN WITH MINERALS) TABS tablet Take 1 tablet by mouth daily.     RABEprazole (ACIPHEX) 20 MG tablet Take 1 tablet (20 mg total) by mouth 2 (two) times daily. 180 tablet 3   rivaroxaban (XARELTO) 20 MG TABS tablet Take 1 tablet (20 mg total) by mouth daily with supper. 90 tablet 3   temazepam (RESTORIL) 30 MG capsule Take 1 capsule (30 mg total) by mouth at bedtime as needed for sleep. 90 capsule 1   zinc gluconate 50 MG tablet Take 100 mg by mouth daily.     No current facility-administered medications on file prior to visit.    BP 120/70  Pulse 72   Temp 98.2 F (36.8 C) (Oral)   Ht 5' 9.5" (1.765 m)   Wt 193 lb (87.5 kg)   SpO2 98%   BMI 28.09 kg/m       Objective:   Physical Exam Vitals and  nursing note reviewed.  Constitutional:      Appearance: He is well-developed.  Cardiovascular:     Rate and Rhythm: Normal rate and regular rhythm.     Pulses: Normal pulses.     Heart sounds: Normal heart sounds.  Pulmonary:     Effort: Pulmonary effort is normal.     Breath sounds: Normal breath sounds.  Abdominal:     General: Abdomen is flat. Bowel sounds are normal.     Palpations: Abdomen is soft.     Tenderness: There is abdominal tenderness in the left lower quadrant. There is left CVA tenderness.  Skin:    General: Skin is warm and dry.  Neurological:     General: No focal deficit present.     Mental Status: He is alert and oriented to person, place, and time.  Psychiatric:        Mood and Affect: Mood normal.        Behavior: Behavior normal.        Thought Content: Thought content normal.        Judgment: Judgment normal.       Assessment & Plan:  1. Diverticulitis (Primary) -Treat for suspected diverticulitis with Ceftin and Flagyl.  Follow-up if symptoms worsen or do not resolve in the next 3 to 4 days - cefUROXime (CEFTIN) 500 MG tablet; Take 1 tablet (500 mg total) by mouth 2 (two) times daily with a meal for 10 days.  Dispense: 20 tablet; Refill: 0 - metroNIDAZOLE (FLAGYL) 500 MG tablet; Take 1 tablet (500 mg total) by mouth 3 (three) times daily for 10 days.  Dispense: 30 tablet; Refill: 0  2. Dysuria  - POC Urinalysis Dipstick + 3 microscopic    3. Other microscopic hematuria -Possibly due to to diverticulitis but need to rule out kidney stone with KUB. - DG Abd 1 View; Future  Shirline Frees, NP  Time spent with patient today was 32 minutes which consisted of chart review, discussing diverticulitis, kidney stones, and hematuria, work up, treatment answering questions and documentation.

## 2023-02-03 NOTE — Telephone Encounter (Addendum)
Copied from CRM [id number]. Topic: Clinical - Red Word Triage >> Feb 03, 2023  8:02 AM Steele Sizer wrote: Red Word that prompted transfer to Nurse Triage: Pt wife Meriam Sprague  stated that the pt has been having pain in his stomach on the left side going around to his back and he is having blood in his stool. Pt wife stated that he has had these symptoms for 3 days now.    Chief Complaint: Abdominal pain Symptoms: Abdominal pain, blood in urine Frequency: Ongoing for the past couple days. Today is day 3. Pertinent Negatives: Patient denies nausea, vomiting, diarrhea Disposition: [] ED /[] Urgent Care (no appt availability in office) / [x] Appointment(In office/virtual)/ []  [facility]/ [] Home Care/ [] Refused Recommended Disposition /[] [facility]/ []  Follow-up with PCP Additional Notes: Patient stated he has been having constant abdominal pain for 3 days now. Pain also radiates to his side and back. His pain is mild at the moment. He has tried taking Tylenol, but it hasn't helped. He has also been having blood in the urine as well over the past couple days. No appointments with patient's PCP until Jan. 27. Same day appointment has been scheduled with different provider.   Reason for Disposition  Blood in urine (red, pink, or tea-colored)  Answer Assessment - Initial Assessment Questions 1. LOCATION: "Where does it hurt?"      Lower, middle area of abdomen  2. RADIATION: "Does the pain shoot anywhere else?" (e.g., chest, back)     Shoots to left side and back  3. ONSET: "When did the pain begin?" (Minutes, hours or days ago)      3 days ago  4. SUDDEN: "Gradual or sudden onset?"     N/A  5. PATTERN "Does the pain come and go, or is it constant?"    - If it comes and goes: "How long does it last?" "Do you have pain now?"     (Note: Comes and goes means the pain is intermittent. It goes away completely between bouts.)    - If constant: "Is it getting better, staying the same,  or getting worse?"      (Note: Constant means the pain never goes away completely; most serious pain is constant and gets worse.)      Constant pain, worse when taking a breath  6. SEVERITY: "How bad is the pain?"  (e.g., Scale 1-10; mild, moderate, or severe)    - MILD (1-3): Doesn't interfere with normal activities, abdomen soft and not tender to touch.     - MODERATE (4-7): Interferes with normal activities or awakens from sleep, abdomen tender to touch.     - SEVERE (8-10): Excruciating pain, doubled over, unable to do any normal activities.       3 or 4 out of 10  7. RECURRENT SYMPTOM: "Have you ever had this type of stomach pain before?" If Yes, ask: "When was the last time?" and "What happened that time?"     Yes, patient had pain a couple years ago and he recived antibiotics for diverticulitis  8. OTHER SYMPTOMS: "Do you have any other symptoms?" (e.g., back pain, diarrhea, fever, urination pain, vomiting)       Blood in urine and a little burning with urination.  Protocols used: Abdominal Pain - Male-A-AH

## 2023-02-08 ENCOUNTER — Telehealth: Payer: Self-pay

## 2023-02-08 NOTE — Telephone Encounter (Signed)
Copied from CRM [id number]. Topic: General - Other >> Feb 08, 2023 10:12 AM Lorin Glass B wrote: Reason for CRM: Patients wife called in stating that her husband had imaging done on 02/03/2023 when they saw Shirline Frees, NP but that no one has called them to go over the results. Stated that Dr. Clent Ridges is PCP but this visit he was unavailable. Wants to verify if Dr. Clent Ridges has had a chance to go over imaging results and would like a callback to discuss. Callback [phone]

## 2023-02-09 ENCOUNTER — Ambulatory Visit (INDEPENDENT_AMBULATORY_CARE_PROVIDER_SITE_OTHER): Payer: Medicare HMO | Admitting: Family Medicine

## 2023-02-09 ENCOUNTER — Encounter: Payer: Self-pay | Admitting: Family Medicine

## 2023-02-09 VITALS — BP 110/60 | HR 88 | Temp 98.2°F | Wt 192.2 lb

## 2023-02-09 DIAGNOSIS — R319 Hematuria, unspecified: Secondary | ICD-10-CM

## 2023-02-09 DIAGNOSIS — R1032 Left lower quadrant pain: Secondary | ICD-10-CM | POA: Diagnosis not present

## 2023-02-09 LAB — POC URINALSYSI DIPSTICK (AUTOMATED)
Bilirubin, UA: NEGATIVE
Glucose, UA: NEGATIVE
Ketones, UA: NEGATIVE
Leukocytes, UA: NEGATIVE
Nitrite, UA: NEGATIVE
Protein, UA: POSITIVE — AB
Spec Grav, UA: 1.01 (ref 1.010–1.025)
Urobilinogen, UA: 0.2 U/dL
pH, UA: 6 (ref 5.0–8.0)

## 2023-02-09 NOTE — Progress Notes (Signed)
   Subjective:    Patient ID: Steven [name], male    DOB: Feb 11, 1950, 73 y.o.   MRN: [id number]  HPI Here for continuing pains in the left flank and LLQ that now radiate into both testicles. He continues to pass visible blood in his urine. This started about 2 weeks ago when he passed 2 tiny stones in his urine. There was no pain or blood at that time. Then a week ago he developed a sharp pain in the left middle back which then moved around to the left flank. Now he also has LLQ pain for the past 4 days. No fever. No urge to urinate. He is passing normal stools. He was seen here on 02-02-22 and he was felt to have diverticulitis, so he was started on Cefuroxime and Metronidazole. Today he feels the same. Sometimes the pain gets severe and he will take 1/2 of an Oxycodone tablet he has at home. Drinking fluids.    Review of Systems  Constitutional: Negative.   Respiratory: Negative.    Cardiovascular: Negative.   Gastrointestinal:  Positive for abdominal pain. Negative for abdominal distention, blood in stool, constipation, diarrhea, nausea, rectal pain and vomiting.  Genitourinary:  Positive for dysuria, flank pain, hematuria, testicular pain and urgency. Negative for difficulty urinating and frequency.       Objective:   Physical Exam Constitutional:      Comments: In mild pain   Cardiovascular:     Rate and Rhythm: Normal rate and regular rhythm.     Pulses: Normal pulses.     Heart sounds: Normal heart sounds.  Pulmonary:     Effort: Pulmonary effort is normal.     Breath sounds: Normal breath sounds.  Abdominal:     General: Abdomen is flat. Bowel sounds are normal. There is no distension.     Palpations: Abdomen is soft. There is no mass.     Tenderness: There is left CVA tenderness. There is no right CVA tenderness, guarding or rebound.     Hernia: No hernia is present.     Comments: Quite tender in the LLQ   Neurological:     Mental Status: He is alert.            Assessment & Plan:  He is trying to pass a kidney stone, but has not been able to. We called [facility], and he will see Dr. Liliane Shi tomorrow at 9 am. He is to stop all food and water after midnight tonight.  Gershon Crane, MD

## 2023-02-09 NOTE — Addendum Note (Signed)
Addended by: Carola Rhine on: 02/09/2023 04:33 PM   Modules accepted: Orders

## 2023-02-10 DIAGNOSIS — R310 Gross hematuria: Secondary | ICD-10-CM | POA: Diagnosis not present

## 2023-02-10 DIAGNOSIS — N201 Calculus of ureter: Secondary | ICD-10-CM | POA: Diagnosis not present

## 2023-02-10 LAB — URINE CULTURE
MICRO NUMBER:: 16014220
Result:: NO GROWTH
SPECIMEN QUALITY:: ADEQUATE

## 2023-02-10 NOTE — Telephone Encounter (Signed)
Pt was seen by Dr Clent Ridges on 02/09/23 for this

## 2023-02-18 ENCOUNTER — Telehealth: Payer: Self-pay | Admitting: Gastroenterology

## 2023-02-18 NOTE — Telephone Encounter (Signed)
 Inbound call from patient wife stating that she wanted to make an appointment with Dr. Nandigam for her husband due to Dr. Teressa retirement. She states that patient has been having issues with Diverticulitis and was seen with his PCP and had a CT scan done and the results showed inflammation of the colon. I advised her that Dr. Shila is currently booked up and we could schedule with a APP. She stated she preferred to stay with a Doctor instead of patient being seen with different providers and asked that I send a message to the nurse to discuss and to see if he can be seen with Dr. Nandigam anytime soon. Please advise.

## 2023-02-21 DIAGNOSIS — D3101 Benign neoplasm of right conjunctiva: Secondary | ICD-10-CM | POA: Diagnosis not present

## 2023-02-21 DIAGNOSIS — H43813 Vitreous degeneration, bilateral: Secondary | ICD-10-CM | POA: Diagnosis not present

## 2023-02-21 DIAGNOSIS — H40013 Open angle with borderline findings, low risk, bilateral: Secondary | ICD-10-CM | POA: Diagnosis not present

## 2023-02-21 DIAGNOSIS — H11042 Peripheral pterygium, stationary, left eye: Secondary | ICD-10-CM | POA: Diagnosis not present

## 2023-02-21 DIAGNOSIS — G43809 Other migraine, not intractable, without status migrainosus: Secondary | ICD-10-CM | POA: Diagnosis not present

## 2023-02-21 DIAGNOSIS — H2513 Age-related nuclear cataract, bilateral: Secondary | ICD-10-CM | POA: Diagnosis not present

## 2023-02-22 ENCOUNTER — Encounter: Payer: PPO | Admitting: Family Medicine

## 2023-02-22 NOTE — Telephone Encounter (Signed)
Spoke with Ms Nance. Patient will come to see Dr Lavon Paganini tomorrow at 8:30 am.

## 2023-02-22 NOTE — Telephone Encounter (Signed)
Recommend seeing first available APP to begin treatment if no openings with Dr Lavon Paganini.  Called patient. No answer. Left message of my call on the voicemail.

## 2023-02-23 ENCOUNTER — Encounter: Payer: Self-pay | Admitting: Gastroenterology

## 2023-02-23 ENCOUNTER — Ambulatory Visit: Payer: Medicare HMO | Admitting: Gastroenterology

## 2023-02-23 ENCOUNTER — Telehealth: Payer: Self-pay | Admitting: *Deleted

## 2023-02-23 VITALS — BP 120/72 | HR 75 | Ht 69.5 in | Wt 195.0 lb

## 2023-02-23 DIAGNOSIS — K659 Peritonitis, unspecified: Secondary | ICD-10-CM

## 2023-02-23 DIAGNOSIS — K5904 Chronic idiopathic constipation: Secondary | ICD-10-CM

## 2023-02-23 DIAGNOSIS — K6389 Other specified diseases of intestine: Secondary | ICD-10-CM

## 2023-02-23 DIAGNOSIS — R1032 Left lower quadrant pain: Secondary | ICD-10-CM

## 2023-02-23 DIAGNOSIS — R1319 Other dysphagia: Secondary | ICD-10-CM

## 2023-02-23 NOTE — Telephone Encounter (Signed)
   Patient Name: Steven [name]  DOB: 07/10/1950 MRN: [id number]  Primary Cardiologist: None  Chart reviewed as part of pre-operative protocol coverage.   Patient's Xarelto is not managed by cardiology and is prescribed for history of DVT.  Please contact prescribing provider for guidance.  Napoleon Form, Leodis Rains, NP 02/23/2023, 2:59 PM

## 2023-02-23 NOTE — Patient Instructions (Addendum)
VISIT SUMMARY:  Today, we discussed your ongoing abdominal pain, difficulty swallowing, and recent kidney stones. We reviewed your symptoms and made a plan to address each issue. 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 We also discussed your difficulty swallowing and have scheduled another procedure to help with this. Lastly, we talked about your kidney stones and the importance of staying hydrated to help pass them.  YOUR PLAN:  -EPIPLOIC APPENDAGITIS: Epiploic appendagitis is a condition where fat tissue around the bowel becomes inflamed and painful. We recommend using MiraLAX powder (one capful in the morning) and continuing Colace at bedtime to help with bowel movements. You should stop taking Metamucil. For pain, you can use lidocaine patches as needed. Eating small, frequent meals may also help. We will monitor your symptoms and reassess if the pain persists.  -DYSPHAGIA: Dysphagia means difficulty swallowing. We suspect that scar tissue may have reformed in your esophagus. We have scheduled an upper endoscopy with esophageal stretching on April 04, 2023, at 8:30 AM. If your symptoms persist after this procedure, we may consider steroid injections or a myotomy if significant scar tissue is present.  -KIDNEY STONES: Kidney stones are hard deposits made of minerals and salts that form inside your kidneys. To help pass the remaining stones, we recommend increasing your fluid intake. Please monitor for any changes in pain or urinary symptoms.  INSTRUCTIONS:  Please follow up with your gastroenterologist after your upper endoscopy and esophageal stretching. Make sure to sign up for MyChart to access your medical records and stay updated on your treatment plan.  Due to recent changes in healthcare laws, you may see the results of your imaging and laboratory studies on MyChart before your provider has had a chance to review them.  We  understand that in some cases there may be results that are confusing or concerning to you. Not all laboratory results come back in the same time frame and the provider may be waiting for multiple results in order to interpret others.  Please give [name] 48 hours in order for your provider to thoroughly review all the results before contacting the office for clarification of your results.    I appreciate the  opportunity to care for you  Thank You   Marsa Aris , MD

## 2023-02-23 NOTE — Telephone Encounter (Signed)
[facility] Pre-operative Risk Assessment     Request for surgical clearance:     Endoscopy Procedure  What type of surgery is being performed?     Upper Endoscopy  When is this surgery scheduled?     04/04/2023  What type of clearance is required ?   Pharmacy  Are there any medications that need to be held prior to surgery and how long? Xarelto  Practice name and name of physician performing surgery?      [facility]  What is your office phone and fax number?      Phone- [phone]  Fax- [phone]  Anesthesia type (None, local, MAC, general) ?       MAC   Please route your response to Zella Ball [phone]

## 2023-02-23 NOTE — Progress Notes (Signed)
 [name]    [id number]    [birth year]  Primary Care Physician:Fry, Tera Mater, MD  Referring Physician: Nelwyn Salisbury, MD [address] [place],  [place] [address]   Chief complaint:  abd pain  Discussed the use of AI scribe software for clinical note transcription with the patient, who gave verbal consent to proceed.  History of Present Illness   Steven [name] "Steven [name]" is a 73 year old male with diverticulitis and kidney stones who presents with abdominal pain and difficulty swallowing.  He has been experiencing abdominal pain primarily under the ribcage for approximately three weeks. A CT scan at that time showed inflammation, and he was initially treated with antibiotics for suspected diverticulitis, which provided some relief. However, he did not experience fever, which he typically associates with diverticulitis episodes. An x-ray indicated constipation, and he has been experiencing blood in his urine due to kidney stones, which has persisted for over two weeks. The pain occasionally improves but remains a significant issue.  He experiences difficulty swallowing, which has persisted despite previous interventions, including esophageal stretching and Botox injections. The Botox provided temporary relief, but the issue has recurred. He struggles with swallowing water, medication, and occasionally food, with no specific type of food exacerbating the issue.  He has a history of kidney stones, with recent episodes coinciding with his abdominal pain. He reports passing one stone on the right side, but still has stones on the left side, which he believes contribute to his pain. He experiences intermittent blood in his urine, which he attributes to the stones.  He reports irregular bowel movements, sometimes not occurring daily. He uses Colace, which he finds helpful, and has previously used Metamucil but is not currently taking it. He maintains hydration by drinking a  lot of water. No fever, diarrhea, mucus, or blood in the stool. He reports soft stools and regular bowel movements. No cold symptoms or sore throat.        1. family history of colon cancer (father in his 73s). Colonoscopy February 2012 found hyperplastic polyp, recall colonoscopy at 5 year interval.   Colonoscopy June 2017 found a single subcentimeter polyp.  This was removed but not retrieved.  Also left colon diverticulosis.  He was recommended to have repeat colonoscopy at 5 years.  Colonoscopy 01/2021 2 subcentimeter adenomas removed.  Recall recommended 7 years on path results letter however changes to 5 years after discussing more with him in the office, his father had colon cancer in his early 57s. 2. ? Esophageal dysmotility, achalasia?. Botox injection by previous gastroenterologist Magod. EGD February 2012 suggested a snug E. junction, esophageal manometry March, 2012 showed normal peristalsis but high residual lower esophageal sphincter pressure. Previoulsy was having dysphagia (around 2000 or so). Botox injection by [facility] helped, but caused worse reflux problems. 04/2012 EGD Christella Hartigan with botox injection again helped his symptoms.  High Res esophageal manometry 02/2013 was essentially normal; no sign of achalasia. EGD 10/2018 small HH, otherwise normal examination.10/2018  Repeat esoph manometry:  normal GE junction, + some findings suggestive of hypercontractile esophagus.  Diltiazem did not help.  EGD 01/2021 medium sized hiatal hernia, tortuous foreshortened esophagus, minor Schatzki's ring dilated to 20 mm.+  Outpatient Encounter Medications as of 02/23/2023  Medication Sig   acetaminophen (TYLENOL) 650 MG CR tablet Take 650-1,300 mg by mouth every 8 (eight) hours as needed for pain.   Ascorbic Acid (VITAMIN C PO) Take by  mouth.   Cholecalciferol (VITAMIN D) 50 MCG (2000 UT) tablet Take 2,000 Units by mouth daily.   clobetasol cream (TEMOVATE) 0.05 % Apply 1 application. topically 2 (two) times  daily as needed for itching.   fluticasone (FLONASE) 50 MCG/ACT nasal spray Place 1 spray into both nostrils daily as needed for allergies or rhinitis.   folic acid (FOLVITE) 800 MCG tablet Take 800 mcg by mouth daily.   loratadine (CLARITIN) 10 MG tablet Take 10 mg by mouth daily as needed for allergies.   losartan (COZAAR) 50 MG tablet Take 1 tablet (50 mg total) by mouth daily.   magic mouthwash (nystatin, hydrocortisone, diphenhydrAMINE) suspension Swish and spit 5 mLs 4 (four) times daily as needed for mouth pain.   methocarbamol (ROBAXIN) 500 MG tablet Take 1 tablet (500 mg total) by mouth every 6 (six) hours as needed for muscle spasms.   Multiple Vitamin (MULTIVITAMIN WITH MINERALS) TABS tablet Take 1 tablet by mouth daily.   RABEprazole (ACIPHEX) 20 MG tablet Take 1 tablet (20 mg total) by mouth 2 (two) times daily.   rivaroxaban (XARELTO) 20 MG TABS tablet Take 1 tablet (20 mg total) by mouth daily with supper.   zinc gluconate 50 MG tablet Take 100 mg by mouth daily.   [DISCONTINUED] ezetimibe (ZETIA) 10 MG tablet Take 1 tablet (10 mg total) by mouth daily.   [DISCONTINUED] fenofibrate (TRICOR) 145 MG tablet Take 1 tablet (145 mg total) by mouth every evening.   [DISCONTINUED] methylPREDNISolone (MEDROL DOSEPAK) 4 MG TBPK tablet As directed   [DISCONTINUED] metoCLOPramide (REGLAN) 10 MG tablet Take 1 tablet (10 mg total) by mouth every 8 (eight) hours as needed (hiccups).   [DISCONTINUED] temazepam (RESTORIL) 30 MG capsule Take 1 capsule (30 mg total) by mouth at bedtime as needed for sleep.   No facility-administered encounter medications on file as of 02/23/2023.    Allergies as of 02/23/2023 - Review Complete 02/23/2023  Allergen Reaction Noted   Morphine Other (See Comments)    Amitriptyline hcl Other (See Comments)    Amoxicillin-pot clavulanate Nausea Only 01/18/2007   Cyclobenzaprine hcl  07/31/2008   Levofloxacin Swelling 04/16/2016   Lisinopril-hydrochlorothiazide   05/24/2012    Past Medical History:  Diagnosis Date   Arthritis    Clotting disorder (HCC)    Displacement of intervertebral disc, site unspecified, without myelopathy    Esophageal reflux    Headache(784.0)    sinus and migraines   Herpes simplex without mention of complication    Hiatal hernia    HTN (hypertension) 05/10/2012   Hyperlipemia    Hypertrophy (benign) of prostate    Intestinal infection due to other organism, not elsewhere classified    Kidney stones    sees Dr. Mena Goes   Personal history of colonic polyps    Personal history of urinary calculi    Personal history of venous thrombosis and embolism    Renal insufficiency     Past Surgical History:  Procedure Laterality Date   APPENDECTOMY     BOTOX INJECTION N/A 04/13/2012   Procedure: BOTOX INJECTION;  Surgeon: Rachael Fee, MD;  Location: [facility];  Service: Endoscopy;  Laterality: N/A;   CERVICAL DISC SURGERY  1996 and 1998   CHOLECYSTECTOMY     COLONOSCOPY  02/10/2021   per Dr. [place] Nation polyps, widespread diverticula, repeat in 5 yrs   ESOPHAGEAL MANOMETRY N/A 02/26/2013   Procedure: ESOPHAGEAL MANOMETRY (EM);  Surgeon: Rachael Fee, MD;  Location: [facility];  Service: Endoscopy;  Laterality: N/A;   ESOPHAGEAL MANOMETRY N/A 11/03/2018   Procedure: ESOPHAGEAL MANOMETRY (EM);  Surgeon: Rachael Fee, MD;  Location: [facility];  Service: Endoscopy;  Laterality: N/A;   ESOPHAGOGASTRODUODENOSCOPY  02/10/2021   per Dr. Christella Hartigan, mod hiatal hernia, one ring was dilated   ESOPHAGOGASTRODUODENOSCOPY (EGD) WITH ESOPHAGEAL DILATION N/A 04/13/2012   Procedure: ESOPHAGOGASTRODUODENOSCOPY (EGD) WITH ESOPHAGEAL DILATION;  Surgeon: Rachael Fee, MD;  Location: [facility];  Service: Endoscopy;  Laterality: N/A;  possible botox injection   EXTRACORPOREAL SHOCK WAVE LITHOTRIPSY Left 08/14/2020   Procedure: LEFT EXTRACORPOREAL SHOCK WAVE LITHOTRIPSY (ESWL);  Surgeon: Belva Agee, MD;   Location: [facility];  Service: Urology;  Laterality: Left;   FINGER SURGERY     3rd finger trigger bilateral    GALLBLADDER SURGERY     LITHOTRIPSY     REVERSE SHOULDER ARTHROPLASTY Right 06/05/2021   Procedure: REVERSE SHOULDER ARTHROPLASTY;  Surgeon: Beverely Low, MD;  Location: [facility];  Service: Orthopedics;  Laterality: Right;  with ISB   ROTATOR CUFF REPAIR Right 01/10/2012   per Dr. Malon Kindle, also repair torn biceps tendon    SHOULDER SURGERY Right    STERIOD INJECTION Left 07/27/2013   Procedure: STEROID INJECTION;  Surgeon: Verlee Rossetti, MD;  Location: [facility];  Service: Orthopedics;  Laterality: Left;   TONSILLECTOMY     TRIGGER FINGER RELEASE Right 07/27/2013   Procedure: RIGHT RING FINGER A-1 RELEASE;  Surgeon: Verlee Rossetti, MD;  Location: [facility] OR;  Service: Orthopedics;  Laterality: Right;   URETERAL STENT PLACEMENT  07/2010   left ureter, per Dr. Mena Goes, for a stone     Family History  Problem Relation Age of Onset   Colon cancer Father    Heart attack Father    Heart disease Brother    Heart disease Brother    Esophageal cancer Neg Hx    Rectal cancer Neg Hx    Stomach cancer Neg Hx     Social History   Socioeconomic History   Marital status: Married    Spouse name: Not on file   Number of children: Not on file   Years of education: Not on file   Highest education level: Not on file  Occupational History   Occupation: Retired  Tobacco Use   Smoking status: Former    Current packs/day: 0.00    Average packs/day: 1.5 packs/day for 20.0 years (30.0 ttl pk-yrs)    Types: Cigarettes    Start date: 03/03/1953    Quit date: 03/03/1973    Years since quitting: 50.0    Passive exposure: Past   Smokeless tobacco: Never   Tobacco comments:    discussed AAA; did have CT of abd/ pelvis   Vaping Use   Vaping status: Never Used  Substance and Sexual Activity   Alcohol use: No    Alcohol/week: 0.0 standard drinks of alcohol   Drug use:  No   Sexual activity: Not on file  Other Topics Concern   Not on file  Social History Narrative   Not on file   Social Drivers of Health   Financial Resource Strain: Low Risk  (07/31/2021)   Overall Financial Resource Strain (CARDIA)    Difficulty of Paying Living Expenses: Not hard at all  Food Insecurity: No Food Insecurity (07/31/2021)   Hunger Vital Sign    Worried About Running Out of Food in the Last Year: Never true    Ran Out of Food in the Last Year: Never  true  Transportation Needs: No Transportation Needs (07/31/2021)   PRAPARE - Administrator, Civil Service (Medical): No    Lack of Transportation (Non-Medical): No  Physical Activity: Insufficiently Active (07/31/2021)   Exercise Vital Sign    Days of Exercise per Week: 2 days    Minutes of Exercise per Session: 40 min  Stress: No Stress Concern Present (07/31/2021)   [employer] of Occupational Health - Occupational Stress Questionnaire    Feeling of Stress : Not at all  Social Connections: Socially Integrated (07/31/2021)   Social Connection and Isolation Panel [NHANES]    Frequency of Communication with Friends and Family: More than three times a week    Frequency of Social Gatherings with Friends and Family: More than three times a week    Attends Religious Services: More than 4 times per year    Active Member of [employer] or Organizations: Not on file    Attends Banker Meetings: More than 4 times per year    Marital Status: Married  Catering manager Violence: Not At Risk (07/31/2021)   Humiliation, Afraid, Rape, and Kick questionnaire    Fear of Current or Ex-Partner: No    Emotionally Abused: No    Physically Abused: No    Sexually Abused: No      Review of systems: All other review of systems negative except as mentioned in the HPI.   Physical Exam: Vitals:   02/23/23 0814  BP: 120/72  Pulse: 75   Body mass index is 28.38 kg/m. Gen:      No acute distress HEENT:  sclera  anicteric CV: s1s2 rrr, no murmur Lungs: B/l clear. Abd:      soft, non-tender; no palpable masses, no distension Ext:    No edema Neuro: alert and oriented x 3 Psych: normal mood and affect  Data Reviewed:  Reviewed labs, radiology imaging, old records and pertinent past GI work up     Assessment and Plan    Epiploic Appendigitis Diagnosed via CT scan on February 10, 2023. Condition involves ischemia and necrosis of fat tissue around the bowel, causing significant pain. Symptoms have persisted for three weeks but are improving. Surgery is not indicated as it may worsen pain. Pain resolution may take three to four months. Recommended lidocaine patches and bowel regimen with stool softeners and MiraLAX. - Add MiraLAX powder, one capful in the morning - Continue Colace at bedtime - Discontinue Metamucil - Use lidocaine patches as needed for pain - Encourage small frequent meals - Monitor symptoms and reassess if pain persists  Dysphagia Persistent difficulty swallowing despite previous esophageal stretching and Botox injections. Suspected scar tissue reformation. Plan for another esophageal stretching without Botox to avoid further scar tissue. Steroid injections may be considered if symptoms persist. Myotomy may be considered if significant scar tissue is present. - Schedule upper endoscopy with esophageal stretching on April 04, 2023 at 8:30 AM - Consider steroid injection if symptoms persist after stretching - Evaluate the need for myotomy if significant scar tissue is present  Kidney Stones Recent episodes of kidney stones with associated abdominal pain and hematuria, which has resolved. Occasional left-sided pain likely due to remaining stones. Recommended increased fluid intake to facilitate stone passage. - Encourage increased fluid intake to facilitate stone passage - Monitor for changes in pain or urinary symptoms  Follow-up - Follow up with gastroenterologist after upper  endoscopy and esophageal stretching - Provide after-visit summary and instructions - Ensure he signs up for  MyChart for access to medical records.          This visit required >40 minutes of patient care (this includes precharting, chart review, review of results, face-to-face time used for counseling as well as treatment plan and follow-up. The patient was provided an opportunity to ask questions and all were answered. The patient agreed with the plan and demonstrated an understanding of the instructions.  Iona Beard , MD    CC: Nelwyn Salisbury, MD

## 2023-02-23 NOTE — Telephone Encounter (Signed)
So sorry, Dr Gershon Crane Please advise on blood thinner if you are the prescriber. Thank You

## 2023-02-28 NOTE — Telephone Encounter (Signed)
 He should hold Xarelto for 3 days prior to surgery

## 2023-03-01 ENCOUNTER — Ambulatory Visit (INDEPENDENT_AMBULATORY_CARE_PROVIDER_SITE_OTHER): Payer: PPO | Admitting: Family Medicine

## 2023-03-01 ENCOUNTER — Encounter: Payer: Self-pay | Admitting: Family Medicine

## 2023-03-01 VITALS — BP 126/84 | HR 61 | Temp 98.4°F | Ht 69.5 in | Wt 192.0 lb

## 2023-03-01 DIAGNOSIS — E785 Hyperlipidemia, unspecified: Secondary | ICD-10-CM | POA: Diagnosis not present

## 2023-03-01 DIAGNOSIS — K6389 Other specified diseases of intestine: Secondary | ICD-10-CM | POA: Diagnosis not present

## 2023-03-01 DIAGNOSIS — Z0000 Encounter for general adult medical examination without abnormal findings: Secondary | ICD-10-CM | POA: Diagnosis not present

## 2023-03-01 LAB — CBC WITH DIFFERENTIAL/PLATELET
Basophils Absolute: 0 10*3/uL (ref 0.0–0.1)
Basophils Relative: 0.7 % (ref 0.0–3.0)
Eosinophils Absolute: 0.2 10*3/uL (ref 0.0–0.7)
Eosinophils Relative: 2.5 % (ref 0.0–5.0)
HCT: 43.9 % (ref 39.0–52.0)
Hemoglobin: 15.2 g/dL (ref 13.0–17.0)
Lymphocytes Relative: 24.8 % (ref 12.0–46.0)
Lymphs Abs: 1.5 10*3/uL (ref 0.7–4.0)
MCHC: 34.6 g/dL (ref 30.0–36.0)
MCV: 88.5 fL (ref 78.0–100.0)
Monocytes Absolute: 0.5 10*3/uL (ref 0.1–1.0)
Monocytes Relative: 7.7 % (ref 3.0–12.0)
Neutro Abs: 4 10*3/uL (ref 1.4–7.7)
Neutrophils Relative %: 64.3 % (ref 43.0–77.0)
Platelets: 224 10*3/uL (ref 150.0–400.0)
RBC: 4.96 Mil/uL (ref 4.22–5.81)
RDW: 13.7 % (ref 11.5–15.5)
WBC: 6.2 10*3/uL (ref 4.0–10.5)

## 2023-03-01 LAB — LIPID PANEL
Cholesterol: 199 mg/dL (ref 0–200)
HDL: 43.8 mg/dL (ref >39.00)
LDL Cholesterol: 102 mg/dL — ABNORMAL HIGH (ref 0–99)
NonHDL: 155.11
Total CHOL/HDL Ratio: 5
Triglycerides: 264 mg/dL — ABNORMAL HIGH (ref 0.0–149.0)
VLDL: 52.8 mg/dL — ABNORMAL HIGH (ref 0.0–40.0)

## 2023-03-01 LAB — HEPATIC FUNCTION PANEL
ALT: 33 U/L (ref 0–53)
AST: 28 U/L (ref 0–37)
Albumin: 4.6 g/dL (ref 3.5–5.2)
Alkaline Phosphatase: 67 U/L (ref 39–117)
Bilirubin, Direct: 0.1 mg/dL (ref 0.0–0.3)
Total Bilirubin: 0.8 mg/dL (ref 0.2–1.2)
Total Protein: 7 g/dL (ref 6.0–8.3)

## 2023-03-01 LAB — BASIC METABOLIC PANEL
BUN: 15 mg/dL (ref 6–23)
CO2: 31 meq/L (ref 19–32)
Calcium: 9.5 mg/dL (ref 8.4–10.5)
Chloride: 103 meq/L (ref 96–112)
Creatinine, Ser: 1.29 mg/dL (ref 0.40–1.50)
GFR: 54.69 mL/min — ABNORMAL LOW (ref >60.00)
Glucose, Bld: 84 mg/dL (ref 70–99)
Potassium: 4.9 meq/L (ref 3.5–5.1)
Sodium: 141 meq/L (ref 135–145)

## 2023-03-01 LAB — PSA: PSA: 0.84 ng/mL (ref 0.10–4.00)

## 2023-03-01 LAB — TSH: TSH: 1.86 u[IU]/mL (ref 0.35–5.50)

## 2023-03-01 LAB — HEMOGLOBIN A1C: Hgb A1c MFr Bld: 5.8 % (ref 4.6–6.5)

## 2023-03-01 MED ORDER — FENOFIBRATE 145 MG PO TABS: 145.0000 mg | ORAL_TABLET | Freq: Every evening | ORAL | 3 refills | Status: AC

## 2023-03-01 MED ORDER — EZETIMIBE 10 MG PO TABS: 10.0000 mg | ORAL_TABLET | Freq: Every day | ORAL | 3 refills | Status: AC

## 2023-03-01 MED ORDER — TEMAZEPAM 30 MG PO CAPS: 30.0000 mg | ORAL_CAPSULE | Freq: Every evening | ORAL | 1 refills | Status: DC | PRN

## 2023-03-01 NOTE — Progress Notes (Signed)
 Subjective:    Patient ID: Steven [name], male    DOB: [birth year], 73 y.o.   MRN: [id number]  HPI:  Here for a well exam. He is doing well in general. He is recovering from a recent bout of epiploic appendagitis which has been causing him to have LLQ pains that come and go. He is taking a stool softener, and he is passing stools easily.    Review of Systems  Constitutional: Negative.   HENT: Negative.    Eyes: Negative.   Respiratory: Negative.    Cardiovascular: Negative.   Gastrointestinal:  Positive for abdominal pain.  Genitourinary: Negative.   Musculoskeletal: Negative.   Skin: Negative.   Neurological: Negative.   Psychiatric/Behavioral: Negative.         Objective:   Physical Exam Constitutional:      General: He is not in acute distress.    Appearance: Normal appearance. He is well-developed. He is not diaphoretic.  HENT:     Head: Normocephalic and atraumatic.     Right Ear: External ear normal.     Left Ear: External ear normal.     Nose: Nose normal.     Mouth/Throat:     Pharynx: No oropharyngeal exudate.  Eyes:     General: No scleral icterus.       Right eye: No discharge.        Left eye: No discharge.     Conjunctiva/sclera: Conjunctivae normal.     Pupils: Pupils are equal, round, and reactive to light.  Neck:     Thyroid: No thyromegaly.     Vascular: No JVD.     Trachea: No tracheal deviation.  Cardiovascular:     Rate and Rhythm: Normal rate and regular rhythm.     Pulses: Normal pulses.     Heart sounds: Normal heart sounds. No murmur heard.    No friction rub. No gallop.  Pulmonary:     Effort: Pulmonary effort is normal. No respiratory distress.     Breath sounds: Normal breath sounds. No wheezing or rales.  Chest:     Chest wall: No tenderness.  Abdominal:     General: Bowel sounds are normal. There is no distension.     Palpations: Abdomen is soft. There is no mass.     Tenderness: There is no guarding or rebound.     Comments:  Mildly tender in the LLQ  Genitourinary:    Penis: Normal. No tenderness.      Prostate: Normal.     Rectum: Normal. Guaiac result negative.  Musculoskeletal:        General: No tenderness. Normal range of motion.     Cervical back: Neck supple.  Lymphadenopathy:     Cervical: No cervical adenopathy.  Skin:    General: Skin is warm and dry.     Coloration: Skin is not pale.     Findings: No erythema or rash.  Neurological:     General: No focal deficit present.     Mental Status: He is alert and oriented to person, place, and time.     Cranial Nerves: No cranial nerve deficit.     Motor: No abnormal muscle tone.     Coordination: Coordination normal.     Deep Tendon Reflexes: Reflexes are normal and symmetric. Reflexes normal.  Psychiatric:        Mood and Affect: Mood normal.        Behavior: Behavior normal.  Thought Content: Thought content normal.        Judgment: Judgment normal.           Assessment & Plan:  Well exam. We discussed diet and exercise. Get fasting labs. Gershon Crane, MD

## 2023-03-03 ENCOUNTER — Telehealth: Payer: Self-pay | Admitting: Family Medicine

## 2023-03-03 NOTE — Telephone Encounter (Signed)
 Copied from CRM [id number]. Topic: General - Other >> Mar 03, 2023 11:01 AM Gurney Maxin H wrote: Reason for CRM: Patients wife is calling to see if she can have 2 copy of patients lab results mailed to address on file, states they need them for insurance purposes.

## 2023-03-03 NOTE — Telephone Encounter (Signed)
 I have called patient and informed him to hold Xarelto 3 days before procedure per Dr Clent Ridges

## 2023-03-04 ENCOUNTER — Encounter: Payer: Self-pay | Admitting: Gastroenterology

## 2023-03-09 ENCOUNTER — Encounter: Payer: Self-pay | Admitting: *Deleted

## 2023-03-09 NOTE — Telephone Encounter (Signed)
 2 Copies of blood work mailed to pt address as requested

## 2023-03-25 ENCOUNTER — Encounter: Payer: Self-pay | Admitting: Gastroenterology

## 2023-04-04 ENCOUNTER — Ambulatory Visit (AMBULATORY_SURGERY_CENTER): Payer: PPO | Admitting: Gastroenterology

## 2023-04-04 ENCOUNTER — Encounter: Payer: Self-pay | Admitting: Gastroenterology

## 2023-04-04 VITALS — BP 117/76 | HR 56 | Temp 97.4°F | Resp 18 | Ht 69.0 in | Wt 195.0 lb

## 2023-04-04 DIAGNOSIS — K449 Diaphragmatic hernia without obstruction or gangrene: Secondary | ICD-10-CM | POA: Diagnosis not present

## 2023-04-04 DIAGNOSIS — K219 Gastro-esophageal reflux disease without esophagitis: Secondary | ICD-10-CM | POA: Diagnosis not present

## 2023-04-04 DIAGNOSIS — R1310 Dysphagia, unspecified: Secondary | ICD-10-CM | POA: Diagnosis not present

## 2023-04-04 DIAGNOSIS — I10 Essential (primary) hypertension: Secondary | ICD-10-CM | POA: Diagnosis not present

## 2023-04-04 DIAGNOSIS — R1319 Other dysphagia: Secondary | ICD-10-CM

## 2023-04-04 MED ORDER — SODIUM CHLORIDE 0.9 % IV SOLN: 500.0000 mL | Freq: Once | INTRAVENOUS | Status: DC

## 2023-04-04 MED ORDER — PANTOPRAZOLE SODIUM 40 MG PO TBEC: 40.0000 mg | DELAYED_RELEASE_TABLET | Freq: Two times a day (BID) | ORAL | 3 refills | Status: DC

## 2023-04-04 NOTE — Op Note (Addendum)
 [facility] Patient Name: Steven [name] Procedure Date: 04/04/2023 9:41 AM MRN: [id number] Endoscopist: Napoleon Form , MD, [id number] Age: 73 Referring MD:  Date of Birth: 1950/02/27 Gender: Male Account #: [id number] Procedure:                Upper GI endoscopy Indications:              Dysphagia Medicines:                Monitored Anesthesia Care Procedure:                Pre-Anesthesia Assessment:                           - Prior to the procedure, a History and Physical                            was performed, and patient medications and                            allergies were reviewed. The patient's tolerance of                            previous anesthesia was also reviewed. The risks                            and benefits of the procedure and the sedation                            options and risks were discussed with the patient.                            All questions were answered, and informed consent                            was obtained. Prior Anticoagulants: The patient                            last took Xarelto (rivaroxaban) 2 days prior to the                            procedure. ASA Grade Assessment: III - A patient                            with severe systemic disease. After reviewing the                            risks and benefits, the patient was deemed in                            satisfactory condition to undergo the procedure.                           After obtaining informed consent, the endoscope was  passed under direct vision. Throughout the                            procedure, the patient's blood pressure, pulse, and                            oxygen saturations were monitored continuously. The                            Olympus scope [id number] was introduced through the                            mouth, and advanced to the second part of duodenum.                            The upper GI endoscopy  was accomplished without                            difficulty. The patient tolerated the procedure                            well. Scope In: Scope Out: Findings:                 No endoscopic abnormality was evident in the                            esophagus to explain the patient's complaint of                            dysphagia. It was decided, however, to proceed with                            dilation of the entire esophagus. A TTS dilator was                            passed through the scope. Dilation with an 18-19-20                            mm x 8 cm CRE balloon dilator was performed to 20                            mm. The dilation site was examined following                            endoscope reinsertion and showed no change.                           A small hiatal hernia was present.                           The cardia and gastric fundus were normal on  retroflexion.                           The examined duodenum was normal. Complications:            No immediate complications. Estimated Blood Loss:     Estimated blood loss was minimal. Impression:               - No endoscopic esophageal abnormality to explain                            patient's dysphagia. Esophagus dilated. Dilated.                           - Small hiatal hernia.                           - Normal examined duodenum.                           - No specimens collected. Recommendation:           - Resume previous diet.                           - Continue present medications.                           - Follow an antireflux regimen.                           - Use Protonix (pantoprazole) 40 mg PO BID.                           - Resume Xarelto (rivaroxaban) at prior dose today.                            Refer to managing physician for further adjustment                            of therapy. Napoleon Form, MD 04/04/2023 10:14:13 AM This report has been signed  electronically.

## 2023-04-04 NOTE — Patient Instructions (Addendum)
 Follow an antireflux regimen.                           - Use Protonix (pantoprazole) 40 mg PO BID.                           - Resume Xarelto (rivaroxaban) at prior dose today.                            Refer to managing physician for further adjustment                            of therapy.  Nothing by mouth until 11:00 am, clear liquids until 12:00 pm, then soft foods for the rest of today.     YOU HAD AN ENDOSCOPIC PROCEDURE TODAY AT THE [facility]:   Refer to the procedure report that was given to you for any specific questions about what was found during the examination.  If the procedure report does not answer your questions, please call your gastroenterologist to clarify.  If you requested that your care partner not be given the details of your procedure findings, then the procedure report has been included in a sealed envelope for you to review at your convenience later.  YOU SHOULD EXPECT: Some feelings of bloating in the abdomen. Passage of more gas than usual.  Walking can help get rid of the air that was put into your GI tract during the procedure and reduce the bloating. If you had a lower endoscopy (such as a colonoscopy or flexible sigmoidoscopy) you may notice spotting of blood in your stool or on the toilet paper. If you underwent a bowel prep for your procedure, you may not have a normal bowel movement for a few days.  Please Note:  You might notice some irritation and congestion in your nose or some drainage.  This is from the oxygen used during your procedure.  There is no need for concern and it should clear up in a day or so.  SYMPTOMS TO REPORT IMMEDIATELY:   Following upper endoscopy (EGD)  Vomiting of blood or coffee ground material  New chest pain or pain under the shoulder blades  Painful or persistently difficult swallowing  New shortness of breath  Fever of 100F or higher  Black, tarry-looking stools  For urgent or emergent issues, a  gastroenterologist can be reached at any hour by calling [phone]. Do not use MyChart messaging for urgent concerns.    DIET:  Nothing by mouth until 11:00 am, clear liquids until 12:00pm, soft foods for the rest of today. Tomorrow you may proceed to your regular diet.  Drink plenty of fluids but you should avoid alcoholic beverages for 24 hours.  ACTIVITY:  You should plan to take it easy for the rest of today and you should NOT DRIVE or use heavy machinery until tomorrow (because of the sedation medicines used during the test).    FOLLOW UP: Our staff will call the number listed on your records the next business day following your procedure.  We will call around 7:15- 8:00 am to check on you and address any questions or concerns that you may have regarding the information given to you following your procedure. If we do not reach you, we will leave a message.  If any biopsies were taken you will be contacted by phone or by letter within the next 1-3 weeks.  Please call us at [phone] if you have not heard about the biopsies in 3 weeks.    SIGNATURES/CONFIDENTIALITY: You and/or your care partner have signed paperwork which will be entered into your electronic medical record.  These signatures attest to the fact that that the information above on your After Visit Summary has been reviewed and is understood.  Full responsibility of the confidentiality of this discharge information lies with you and/or your care-partner.

## 2023-04-04 NOTE — Progress Notes (Signed)
 Called to room to assist during endoscopic procedure.  Patient ID and intended procedure confirmed with present staff. Received instructions for my participation in the procedure from the performing physician.

## 2023-04-04 NOTE — Progress Notes (Signed)
To pacu, Vss.Report to Rn.tb

## 2023-04-04 NOTE — Progress Notes (Signed)
 Pt's states no medical or surgical changes since previsit or office visit.

## 2023-04-04 NOTE — Progress Notes (Signed)
 [facility] History and Physical   Primary Care Physician:  Nelwyn Salisbury, MD   Reason for Procedure:  Dysphagia  Plan:    EGD  with possible interventions as needed     HPI: Steven [name] is a very pleasant 73 y.o. male here for EGD for recurrent dysphagia. Please refer to office visit note for additional details  The risks and benefits as well as alternatives of endoscopic procedure(s) have been discussed and reviewed. All questions answered. The patient agrees to proceed.    Past Medical History:  Diagnosis Date   Arthritis    Clotting disorder (HCC)    Displacement of intervertebral disc, site unspecified, without myelopathy    Esophageal reflux    Headache(784.0)    sinus and migraines   Herpes simplex without mention of complication    Hiatal hernia    HTN (hypertension) 05/10/2012   Hyperlipemia    Hypertrophy (benign) of prostate    Intestinal infection due to other organism, not elsewhere classified    Kidney stones    sees Dr. Mena Goes   Personal history of colonic polyps    Personal history of urinary calculi    Personal history of venous thrombosis and embolism    Renal insufficiency     Past Surgical History:  Procedure Laterality Date   APPENDECTOMY     BOTOX INJECTION N/A 04/13/2012   Procedure: BOTOX INJECTION;  Surgeon: Rachael Fee, MD;  Location: [facility];  Service: Endoscopy;  Laterality: N/A;   CERVICAL DISC SURGERY  1996 and 1998   CHOLECYSTECTOMY     COLONOSCOPY  02/10/2021   per Dr. [place] polyps, widespread diverticula, repeat in 5 yrs   ESOPHAGEAL MANOMETRY N/A 02/26/2013   Procedure: ESOPHAGEAL MANOMETRY (EM);  Surgeon: Rachael Fee, MD;  Location: [facility];  Service: Endoscopy;  Laterality: N/A;   ESOPHAGEAL MANOMETRY N/A 11/03/2018   Procedure: ESOPHAGEAL MANOMETRY (EM);  Surgeon: Rachael Fee, MD;  Location: [facility];  Service: Endoscopy;  Laterality: N/A;   ESOPHAGOGASTRODUODENOSCOPY   02/10/2021   per Dr. Christella Hartigan, mod hiatal hernia, one ring was dilated   ESOPHAGOGASTRODUODENOSCOPY (EGD) WITH ESOPHAGEAL DILATION N/A 04/13/2012   Procedure: ESOPHAGOGASTRODUODENOSCOPY (EGD) WITH ESOPHAGEAL DILATION;  Surgeon: Rachael Fee, MD;  Location: [facility];  Service: Endoscopy;  Laterality: N/A;  possible botox injection   EXTRACORPOREAL SHOCK WAVE LITHOTRIPSY Left 08/14/2020   Procedure: LEFT EXTRACORPOREAL SHOCK WAVE LITHOTRIPSY (ESWL);  Surgeon: Belva Agee, MD;  Location: [facility];  Service: Urology;  Laterality: Left;   FINGER SURGERY     3rd finger trigger bilateral    GALLBLADDER SURGERY     LITHOTRIPSY     REVERSE SHOULDER ARTHROPLASTY Right 06/05/2021   Procedure: REVERSE SHOULDER ARTHROPLASTY;  Surgeon: Beverely Low, MD;  Location: [facility];  Service: Orthopedics;  Laterality: Right;  with ISB   ROTATOR CUFF REPAIR Right 01/10/2012   per Dr. Malon Kindle, also repair torn biceps tendon    SHOULDER SURGERY Right    STERIOD INJECTION Left 07/27/2013   Procedure: STEROID INJECTION;  Surgeon: Verlee Rossetti, MD;  Location: [facility] OR;  Service: Orthopedics;  Laterality: Left;   TONSILLECTOMY     TRIGGER FINGER RELEASE Right 07/27/2013   Procedure: RIGHT RING FINGER A-1 RELEASE;  Surgeon: Verlee Rossetti, MD;  Location: [facility] OR;  Service: Orthopedics;  Laterality: Right;   URETERAL STENT PLACEMENT  07/2010   left ureter, per Dr. Mena Goes, for a stone     Prior  to Admission medications   Medication Sig Start Date End Date Taking? Authorizing Provider  Ascorbic Acid (VITAMIN C PO) Take by mouth.   Yes [provider]  Cholecalciferol (VITAMIN D) 50 MCG (2000 UT) tablet Take 2,000 Units by mouth daily.   Yes [provider]  clobetasol cream (TEMOVATE) 0.05 % Apply 1 application. topically 2 (two) times daily as needed for itching. 03/05/21  Yes [provider]  ezetimibe (ZETIA) 10 MG tablet Take 1 tablet (10 mg total) by mouth  daily. 03/01/23  Yes Nelwyn Salisbury, MD  fenofibrate (TRICOR) 145 MG tablet Take 1 tablet (145 mg total) by mouth every evening. 03/01/23  Yes Nelwyn Salisbury, MD  fluticasone [facility]) 50 MCG/ACT nasal spray Place 1 spray into both nostrils daily as needed for allergies or rhinitis.   Yes [provider]  folic acid (FOLVITE) 800 MCG tablet Take 800 mcg by mouth daily.   Yes [provider]  loratadine (CLARITIN) 10 MG tablet Take 10 mg by mouth daily as needed for allergies.   Yes [provider]  losartan (COZAAR) 50 MG tablet Take 1 tablet (50 mg total) by mouth daily. 04/12/22  Yes Nelwyn Salisbury, MD  Multiple Vitamin (MULTIVITAMIN WITH MINERALS) TABS tablet Take 1 tablet by mouth daily.   Yes [provider]  RABEprazole (ACIPHEX) 20 MG tablet Take 1 tablet (20 mg total) by mouth 2 (two) times daily. 10/01/22  Yes Nelwyn Salisbury, MD  temazepam (RESTORIL) 30 MG capsule Take 1 capsule (30 mg total) by mouth at bedtime as needed for sleep. 03/01/23  Yes Nelwyn Salisbury, MD  zinc gluconate 50 MG tablet Take 100 mg by mouth daily.   Yes [provider]  acetaminophen (TYLENOL) 650 MG CR tablet Take 650-1,300 mg by mouth every 8 (eight) hours as needed for pain.    [provider]  magic mouthwash (nystatin, hydrocortisone, diphenhydrAMINE) suspension Swish and spit 5 mLs 4 (four) times daily as needed for mouth pain. 11/26/22   Nelwyn Salisbury, MD  methocarbamol (ROBAXIN) 500 MG tablet Take 1 tablet (500 mg total) by mouth every 6 (six) hours as needed for muscle spasms. 06/05/21   Beverely Low, MD  rivaroxaban (XARELTO) 20 MG TABS tablet Take 1 tablet (20 mg total) by mouth daily with supper. 07/09/22   Nelwyn Salisbury, MD    Current Outpatient Medications  Medication Sig Dispense Refill   Ascorbic Acid (VITAMIN C PO) Take by mouth.     Cholecalciferol (VITAMIN D) 50 MCG (2000 UT) tablet Take 2,000 Units by mouth daily.     clobetasol cream (TEMOVATE)  0.05 % Apply 1 application. topically 2 (two) times daily as needed for itching.     ezetimibe (ZETIA) 10 MG tablet Take 1 tablet (10 mg total) by mouth daily. 90 tablet 3   fenofibrate (TRICOR) 145 MG tablet Take 1 tablet (145 mg total) by mouth every evening. 90 tablet 3   fluticasone (FLONASE) 50 MCG/ACT nasal spray Place 1 spray into both nostrils daily as needed for allergies or rhinitis.     folic acid (FOLVITE) 800 MCG tablet Take 800 mcg by mouth daily.     loratadine (CLARITIN) 10 MG tablet Take 10 mg by mouth daily as needed for allergies.     losartan (COZAAR) 50 MG tablet Take 1 tablet (50 mg total) by mouth daily. 90 tablet 3   Multiple Vitamin (MULTIVITAMIN WITH MINERALS) TABS tablet Take 1 tablet by mouth  daily.     RABEprazole (ACIPHEX) 20 MG tablet Take 1 tablet (20 mg total) by mouth 2 (two) times daily. 180 tablet 3   temazepam (RESTORIL) 30 MG capsule Take 1 capsule (30 mg total) by mouth at bedtime as needed for sleep. 90 capsule 1   zinc gluconate 50 MG tablet Take 100 mg by mouth daily.     acetaminophen (TYLENOL) 650 MG CR tablet Take 650-1,300 mg by mouth every 8 (eight) hours as needed for pain.     magic mouthwash (nystatin, hydrocortisone, diphenhydrAMINE) suspension Swish and spit 5 mLs 4 (four) times daily as needed for mouth pain. 540 mL 2   methocarbamol (ROBAXIN) 500 MG tablet Take 1 tablet (500 mg total) by mouth every 6 (six) hours as needed for muscle spasms. 40 tablet 1   rivaroxaban (XARELTO) 20 MG TABS tablet Take 1 tablet (20 mg total) by mouth daily with supper. 90 tablet 3   Current Facility-Administered Medications  Medication Dose Route Frequency Provider Last Rate Last Admin   0.9 %  sodium chloride infusion  500 mL Intravenous Once Napoleon Form, MD        Allergies as of 04/04/2023 - Review Complete 04/04/2023  Allergen Reaction Noted   Morphine Other (See Comments)    Levofloxacin Swelling 04/16/2016   Amitriptyline hcl Other (See  Comments)    Amoxicillin-pot clavulanate Nausea Only 01/18/2007   Cyclobenzaprine hcl Other (See Comments) 07/31/2008   Lisinopril-hydrochlorothiazide Other (See Comments) 05/24/2012    Family History  Problem Relation Age of Onset   Colon cancer Father    Heart attack Father    Heart disease Brother    Heart disease Brother    Esophageal cancer Neg Hx    Rectal cancer Neg Hx    Stomach cancer Neg Hx     Social History   Socioeconomic History   Marital status: Married    Spouse name: Not on file   Number of children: Not on file   Years of education: Not on file   Highest education level: Not on file  Occupational History   Occupation: Retired  Tobacco Use   Smoking status: Former    Current packs/day: 0.00    Average packs/day: 1.5 packs/day for 20.0 years (30.0 ttl pk-yrs)    Types: Cigarettes    Start date: 03/03/1953    Quit date: 03/03/1973    Years since quitting: 50.1    Passive exposure: Past   Smokeless tobacco: Never   Tobacco comments:    discussed AAA; did have CT of abd/ pelvis   Vaping Use   Vaping status: Never Used  Substance and Sexual Activity   Alcohol use: No    Alcohol/week: 0.0 standard drinks of alcohol   Drug use: No   Sexual activity: Not on file  Other Topics Concern   Not on file  Social History Narrative   Not on file   Social Drivers of Health   Financial Resource Strain: Low Risk  (07/31/2021)   Overall Financial Resource Strain (CARDIA)    Difficulty of Paying Living Expenses: Not hard at all  Food Insecurity: No Food Insecurity (07/31/2021)   Hunger Vital Sign    Worried About Running Out of Food in the Last Year: Never true    Ran Out of Food in the Last Year: Never true  Transportation Needs: No Transportation Needs (07/31/2021)   PRAPARE - Administrator, Civil Service (Medical): No    Lack of Transportation (  Non-Medical): No  Physical Activity: Insufficiently Active (07/31/2021)   Exercise Vital Sign    Days of  Exercise per Week: 2 days    Minutes of Exercise per Session: 40 min  Stress: No Stress Concern Present (07/31/2021)   [employer] of Occupational Health - Occupational Stress Questionnaire    Feeling of Stress : Not at all  Social Connections: Socially Integrated (07/31/2021)   Social Connection and Isolation Panel [NHANES]    Frequency of Communication with Friends and Family: More than three times a week    Frequency of Social Gatherings with Friends and Family: More than three times a week    Attends Religious Services: More than 4 times per year    Active Member of [employer] or Organizations: Not on file    Attends Banker Meetings: More than 4 times per year    Marital Status: Married  Catering manager Violence: Not At Risk (07/31/2021)   Humiliation, Afraid, Rape, and Kick questionnaire    Fear of Current or Ex-Partner: No    Emotionally Abused: No    Physically Abused: No    Sexually Abused: No    Review of Systems:  All other review of systems negative except as mentioned in the HPI.  Physical Exam: Vital signs in last 24 hours: BP (!) 143/71   Pulse (!) 59   Temp (!) 97.4 F (36.3 C)   Ht 5\' 9"  (1.753 m)   Wt 195 lb (88.5 kg)   SpO2 96%   BMI 28.80 kg/m  General:   Alert, NAD Lungs:  Clear .   Heart:  Regular rate and rhythm Abdomen:  Soft, nontender and nondistended. Neuro/Psych:  Alert and cooperative. Normal mood and affect. A and O x 3  Reviewed labs, radiology imaging, old records and pertinent past GI work up  Patient is appropriate for planned procedure(s) and anesthesia in an ambulatory setting   K. Scherry Ran , MD [phone]

## 2023-04-05 ENCOUNTER — Telehealth: Payer: Self-pay | Admitting: *Deleted

## 2023-04-05 NOTE — Telephone Encounter (Signed)
  Follow up Call-     04/04/2023    9:27 AM 02/10/2021    9:23 AM  Call back number  Post procedure Call Back phone  # [phone]  Permission to leave phone message Yes Yes     Patient questions:  Do you have a fever, pain , or abdominal swelling? No. Pain Score  0 *  Have you tolerated food without any problems? Yes.    Have you been able to return to your normal activities? Yes.    Do you have any questions about your discharge instructions: Diet   No. Medications  No. Follow up visit  No.  Do you have questions or concerns about your Care? No.  Actions: * If pain score is 4 or above: No action needed, pain <4.

## 2023-04-25 ENCOUNTER — Other Ambulatory Visit (HOSPITAL_COMMUNITY): Payer: Self-pay

## 2023-05-02 ENCOUNTER — Other Ambulatory Visit: Payer: Self-pay | Admitting: Family Medicine

## 2023-05-02 DIAGNOSIS — I10 Essential (primary) hypertension: Secondary | ICD-10-CM

## 2023-05-02 NOTE — Telephone Encounter (Signed)
 Copied from CRM [id number]. Topic: Clinical - Medication Refill >> May 02, 2023 11:49 AM Howard Macho wrote: Most Recent Primary Care Visit:  Provider: Corita Diego A  Department: [facility]  Visit Type: PHYSICAL  Date: 03/01/2023  Medication: losartan  (COZAAR ) 50 MG tablet  Has the patient contacted their pharmacy? Yes (Agent: If no, request that the patient contact the pharmacy for the refill. If patient does not wish to contact the pharmacy document the reason why and proceed with request.) (Agent: If yes, when and what did the pharmacy advise?)  Is this the correct pharmacy for this prescription? Yes If no, delete pharmacy and type the correct one.  This is the patient's preferred pharmacy:    [facility] [place], [place] - [address]  Has the prescription been filled recently? No  Is the patient out of the medication? Yes  Has the patient been seen for an appointment in the last year OR does the patient have an upcoming appointment? Yes  Can we respond through MyChart? Yes  Agent: Please be advised that Rx refills may take up to 3 business days. We ask that you follow-up with your pharmacy.

## 2023-05-06 MED ORDER — LOSARTAN POTASSIUM 50 MG PO TABS: 50.0000 mg | ORAL_TABLET | Freq: Every day | ORAL | 3 refills | Status: AC

## 2023-05-09 ENCOUNTER — Telehealth: Payer: Self-pay | Admitting: Family Medicine

## 2023-05-09 NOTE — Telephone Encounter (Signed)
 [facility] called and spoke to [place], [name] about the refill(s) losartan  requested. Advised it was sent on 05/06/23 #90/3 refill(s). He says it's ready for pickup. Patient's wife called, left VM that the medication is available at [facility] and that a refill request to send to Alyssa Jumper will be sent to Dr. Alyne Babinski, call back to the office if any questions.  Copied from CRM [id number]. Topic: Clinical - Medication Refill >> May 02, 2023 11:49 AM Howard Macho wrote: Most Recent Primary Care Visit:  Provider: Corita Diego A  Department: [facility]  Visit Type: PHYSICAL  Date: 03/01/2023  Medication: losartan  (COZAAR ) 50 MG tablet  Has the patient contacted their pharmacy? Yes (Agent: If no, request that the patient contact the pharmacy for the refill. If patient does not wish to contact the pharmacy document the reason why and proceed with request.) (Agent: If yes, when and what did the pharmacy advise?)  Is this the correct pharmacy for this prescription? Yes If no, delete pharmacy and type the correct one.  This is the patient's preferred pharmacy:    [facility] [place], [place] - [address]  Has the prescription been filled recently? No  Is the patient out of the medication? Yes  Has the patient been seen for an appointment in the last year OR does the patient have an upcoming appointment? Yes  Can we respond through MyChart? Yes  Agent: Please be advised that Rx refills may take up to 3 business days. We ask that you follow-up with your pharmacy. >> May 09, 2023  8:43 AM Clydene Darner H wrote: Patient's wife called this morning to follow up on the CRM from 05/02/2023. She stated she has contacted the pharmacy twice and was informed they have not received any prescriptions. The patient's wife is requesting that either a two-week or a one-month supply be sent to [facility], and the full prescription be sent to [facility] [employer] [facility]  - [address], [place], [place]. She mentioned this has been an ongoing issue for three weeks, and the patient is now out of medication. She is requesting a callback regarding the medication refill status.  Callback Number: [phone]

## 2023-05-10 NOTE — Telephone Encounter (Signed)
 Done

## 2023-05-16 ENCOUNTER — Telehealth: Payer: Self-pay | Admitting: Gastroenterology

## 2023-05-16 ENCOUNTER — Other Ambulatory Visit: Payer: Self-pay

## 2023-05-16 DIAGNOSIS — K5792 Diverticulitis of intestine, part unspecified, without perforation or abscess without bleeding: Secondary | ICD-10-CM

## 2023-05-16 MED ORDER — AMOXICILLIN-POT CLAVULANATE 875-125 MG PO TABS: 1.0000 | ORAL_TABLET | Freq: Two times a day (BID) | ORAL | 0 refills | Status: DC

## 2023-05-16 NOTE — Addendum Note (Signed)
 Addended by: Heber Little A on: 05/16/2023 01:11 PM   Modules accepted: Orders

## 2023-05-16 NOTE — Telephone Encounter (Signed)
 Spoke with spouse. The patient developed LLQ to cent abdomen pain described as a twisting pain. Worse when he is up and moving. Less pain if he is laying down. Thinks he had some fever Saturday night. No fever now. He had "left over antibiotics from last time" that he has started. He cannot tell any improvement with the antibiotics. He denies constipation. Taking Colace and Miralax . Appointment 05/18/23.

## 2023-05-16 NOTE — Telephone Encounter (Signed)
 Please send prescription for Augmentin  875 twice daily for 10 days.  Obtain stat CT abdomen pelvis to evaluate for acute diverticulitis or complications from it.  Clear liquid diet today and slowly advance as tolerated.  Please advise him to come to ER if his symptoms worsen.

## 2023-05-16 NOTE — Telephone Encounter (Signed)
 Called spouse's cell phone. No answer. Left message of my call.

## 2023-05-16 NOTE — Telephone Encounter (Signed)
 Called the patient. No answer.

## 2023-05-16 NOTE — Telephone Encounter (Signed)
 CT 05/17/23 at 12:00. Scheduled by Radiology.

## 2023-05-16 NOTE — Telephone Encounter (Signed)
 Spoke with the spouse. Confirmed the pharmacy. Explained the plan of care. Agrees to this plan of care.

## 2023-05-16 NOTE — Telephone Encounter (Signed)
 Inbound call from Patient spouse,states patient had a diverticulitis flare up over the weekend.   Taking prescribed meds but states the medication does not seem to help.   Scheduled for 5/7 with provider. Patient did inquire of a sooner appointment, unfortunately provider has nothing available sooner.   Requesting to speak with a nurse. Please advise.   Thank you

## 2023-05-17 ENCOUNTER — Ambulatory Visit (HOSPITAL_BASED_OUTPATIENT_CLINIC_OR_DEPARTMENT_OTHER)
Admission: RE | Admit: 2023-05-17 | Discharge: 2023-05-17 | Disposition: A | Discharge status: Home / Self Care | Source: Ambulatory Visit | Attending: Gastroenterology | Admitting: Gastroenterology

## 2023-05-17 DIAGNOSIS — N200 Calculus of kidney: Secondary | ICD-10-CM | POA: Diagnosis not present

## 2023-05-17 DIAGNOSIS — Q630 Accessory kidney: Secondary | ICD-10-CM | POA: Diagnosis not present

## 2023-05-17 DIAGNOSIS — K5792 Diverticulitis of intestine, part unspecified, without perforation or abscess without bleeding: Secondary | ICD-10-CM | POA: Diagnosis not present

## 2023-05-17 DIAGNOSIS — K5732 Diverticulitis of large intestine without perforation or abscess without bleeding: Secondary | ICD-10-CM | POA: Diagnosis not present

## 2023-05-17 LAB — POCT I-STAT CREATININE: Creatinine, Ser: 1.4 mg/dL — ABNORMAL HIGH (ref 0.61–1.24)

## 2023-05-17 MED ORDER — IOHEXOL 300 MG/ML  SOLN
100.0000 mL | Freq: Once | INTRAMUSCULAR | Status: AC | PRN
  Administered 2023-05-17: 100 mL via INTRAVENOUS

## 2023-05-18 ENCOUNTER — Ambulatory Visit: Admitting: Gastroenterology

## 2023-05-18 ENCOUNTER — Encounter: Payer: Self-pay | Admitting: Gastroenterology

## 2023-05-18 VITALS — BP 126/68 | HR 70 | Ht 69.5 in | Wt 192.0 lb

## 2023-05-18 DIAGNOSIS — R109 Unspecified abdominal pain: Secondary | ICD-10-CM

## 2023-05-18 DIAGNOSIS — K449 Diaphragmatic hernia without obstruction or gangrene: Secondary | ICD-10-CM

## 2023-05-18 DIAGNOSIS — R1310 Dysphagia, unspecified: Secondary | ICD-10-CM

## 2023-05-18 DIAGNOSIS — R1032 Left lower quadrant pain: Secondary | ICD-10-CM

## 2023-05-18 DIAGNOSIS — K5732 Diverticulitis of large intestine without perforation or abscess without bleeding: Secondary | ICD-10-CM

## 2023-05-18 DIAGNOSIS — K219 Gastro-esophageal reflux disease without esophagitis: Secondary | ICD-10-CM

## 2023-05-18 DIAGNOSIS — K648 Other hemorrhoids: Secondary | ICD-10-CM

## 2023-05-18 DIAGNOSIS — K641 Second degree hemorrhoids: Secondary | ICD-10-CM

## 2023-05-18 DIAGNOSIS — K625 Hemorrhage of anus and rectum: Secondary | ICD-10-CM

## 2023-05-18 MED ORDER — RABEPRAZOLE SODIUM 20 MG PO TBEC: 20.0000 mg | DELAYED_RELEASE_TABLET | Freq: Two times a day (BID) | ORAL | 3 refills | Status: DC

## 2023-05-18 MED ORDER — HYDROCORTISONE ACETATE 25 MG RE SUPP: 25.0000 mg | Freq: Every day | RECTAL | 1 refills | Status: AC

## 2023-05-18 NOTE — Progress Notes (Signed)
 Steven [name]    [id number]    [birth year]  Primary Care Physician:Fry, Lenis Quin, MD  Referring Physician: Donley Furth, MD [address] [place],  [place] [address]   Chief complaint: Left lower quadrant abdominal pain, sigmoid diverticulitis, rectal bleeding, GERD  Discussed the use of AI scribe software for clinical note transcription with the patient, who gave verbal consent to proceed.  History of Present Illness Steven [name] "Steven [name]" is a 73 year old male with diverticulitis who presents with rectal bleeding and abdominal pain.  He has been experiencing rectal bleeding since yesterday, characterized by bright red blood and occurring briefly. He often needs to place something to prevent bleeding through, indicating significant bleeding at times. He suspects hemorrhoids as the cause.  He reports abdominal pain, particularly in the sigmoid area, and notes that touching his stomach is painful. This episode is described as the worst he has experienced, with similar episodes occurring a couple of times before. He is currently on antibiotics, including ciprofloxacin  and Flagyl , taken three times and two times a day, respectively, having started a new course on Monday after a previous course over the weekend.  He has made dietary adjustments due to his condition, having been on a liquid diet yesterday and now consuming soft foods like scrambled eggs and grits. He is avoiding red meat and raw vegetables.  He reports ongoing issues with acid reflux and difficulty swallowing, which he attributes to a hiatal hernia. He describes a sensation of food getting stuck and causing soreness. He has been on a new medication for acid reflux but feels his previous medication was more effective and plans to resume it.  CT abdomen pelvis with contrast 05/17/23 1. Mild mid to distal acute sigmoid diverticulitis. No extraluminal gas or abscess. 2. Mild circumferential distal esophageal  wall thickening, nonspecific although esophagitis would be a common cause. 3. 2 mm left kidney lower pole nonobstructive renal calculus. 4. 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 5. Mild lumbar spondylosis and degenerative disc disease.   Colonoscopy 02/10/21 - Three 1 to 3 mm polyps in the transverse colon and in the cecum, removed with a cold snare. Resected and retrieved. - Diverticulosis in the entire examined colon. - Internal hemorrhoids. - The examination was otherwise normal on direct and retroflexion views. Surgical [P], colon, transverse and cecum, polyp (3) - TUBULAR ADENOMA(S) WITHOUT HIGH-GRADE DYSPLASIA OR MALIGNANCY - HYPERPLASTIC POLYP  EGD April 04, 2023 - No endoscopic esophageal abnormality to explain patient' s dysphagia. Esophagus dilated. Dilated to 20 mm with balloon. - Small hiatal hernia. - Normal examined duodenum.  Outpatient Encounter Medications as of 05/18/2023  Medication Sig   acetaminophen  (TYLENOL ) 650 MG CR tablet Take 650-1,300 mg by mouth every 8 (eight) hours as needed for pain.   amoxicillin -clavulanate (AUGMENTIN ) 875-125 MG tablet Take 1 tablet by mouth 2 (two) times daily.   Ascorbic Acid  (VITAMIN C PO) Take by mouth.   Cholecalciferol  (VITAMIN D) 50 MCG (2000 UT) tablet Take 2,000 Units by mouth daily.   clobetasol  cream (TEMOVATE ) 0.05 % Apply 1 application. topically 2 (two) times daily as needed for itching.   ezetimibe  (ZETIA ) 10 MG tablet Take 1 tablet (10 mg total) by mouth daily.   fenofibrate  (TRICOR ) 145 MG tablet Take 1 tablet (145 mg total) by mouth every evening.   fluticasone  (FLONASE ) 50 MCG/ACT nasal spray Place 1 spray into both nostrils daily as  needed for allergies or rhinitis.   folic acid  (FOLVITE ) 800 MCG tablet Take 800 mcg by mouth daily.   loratadine  (CLARITIN ) 10 MG tablet Take 10 mg by mouth daily as needed for allergies.   losartan  (COZAAR ) 50 MG tablet Take 1 tablet (50 mg total) by mouth  daily.   magic mouthwash (nystatin , hydrocortisone , diphenhydrAMINE ) suspension Swish and spit 5 mLs 4 (four) times daily as needed for mouth pain.   methocarbamol  (ROBAXIN ) 500 MG tablet Take 1 tablet (500 mg total) by mouth every 6 (six) hours as needed for muscle spasms.   Multiple Vitamin (MULTIVITAMIN WITH MINERALS) TABS tablet Take 1 tablet by mouth daily.   pantoprazole  (PROTONIX ) 40 MG tablet Take 1 tablet (40 mg total) by mouth 2 (two) times daily.   rivaroxaban  (XARELTO ) 20 MG TABS tablet Take 1 tablet (20 mg total) by mouth daily with supper.   temazepam  (RESTORIL ) 30 MG capsule Take 1 capsule (30 mg total) by mouth at bedtime as needed for sleep.   zinc  gluconate 50 MG tablet Take 100 mg by mouth daily.   No facility-administered encounter medications on file as of 05/18/2023.    Allergies as of 05/18/2023 - Review Complete 04/04/2023  Allergen Reaction Noted   Morphine Other (See Comments)    Levofloxacin  Swelling 04/16/2016   Amitriptyline hcl Other (See Comments)    Amoxicillin -pot clavulanate Nausea Only 01/18/2007   Cyclobenzaprine  hcl Other (See Comments) 07/31/2008   Lisinopril -hydrochlorothiazide  Other (See Comments) 05/24/2012    Past Medical History:  Diagnosis Date   Arthritis    Clotting disorder (HCC)    Displacement of intervertebral disc, site unspecified, without myelopathy    Esophageal reflux    Headache(784.0)    sinus and migraines   Herpes simplex without mention of complication    Hiatal hernia    HTN (hypertension) 05/10/2012   Hyperlipemia    Hypertrophy (benign) of prostate    Intestinal infection due to other organism, not elsewhere classified    Kidney stones    sees Dr. Derrick Fling   Personal history of colonic polyps    Personal history of urinary calculi    Personal history of venous thrombosis and embolism    Renal insufficiency     Past Surgical History:  Procedure Laterality Date   APPENDECTOMY     BOTOX  INJECTION N/A 04/13/2012    Procedure: BOTOX  INJECTION;  Surgeon: Janel Medford, MD;  Location: [facility];  Service: Endoscopy;  Laterality: N/A;   CERVICAL DISC SURGERY  1996 and 1998   CHOLECYSTECTOMY     COLONOSCOPY  02/10/2021   per Dr. Londa Rival polyps, widespread diverticula, repeat in 5 yrs   ESOPHAGEAL MANOMETRY N/A 02/26/2013   Procedure: ESOPHAGEAL MANOMETRY (EM);  Surgeon: Janel Medford, MD;  Location: [facility];  Service: Endoscopy;  Laterality: N/A;   ESOPHAGEAL MANOMETRY N/A 11/03/2018   Procedure: ESOPHAGEAL MANOMETRY (EM);  Surgeon: Janel Medford, MD;  Location: [facility];  Service: Endoscopy;  Laterality: N/A;   ESOPHAGOGASTRODUODENOSCOPY  02/10/2021   per Dr. Howard Macho, mod hiatal hernia, one ring was dilated   ESOPHAGOGASTRODUODENOSCOPY (EGD) WITH ESOPHAGEAL DILATION N/A 04/13/2012   Procedure: ESOPHAGOGASTRODUODENOSCOPY (EGD) WITH ESOPHAGEAL DILATION;  Surgeon: Janel Medford, MD;  Location: [facility];  Service: Endoscopy;  Laterality: N/A;  possible botox  injection   EXTRACORPOREAL SHOCK WAVE LITHOTRIPSY Left 08/14/2020   Procedure: LEFT EXTRACORPOREAL SHOCK WAVE LITHOTRIPSY (ESWL);  Surgeon: Sherlyn Ditto, MD;  Location[facility];  Service: Urology;  Laterality: Left;  FINGER SURGERY     3rd finger trigger bilateral    GALLBLADDER SURGERY     LITHOTRIPSY     REVERSE SHOULDER ARTHROPLASTY Right 06/05/2021   Procedure: REVERSE SHOULDER ARTHROPLASTY;  Surgeon: Winston Hawking, MD;  Location: [facility];  Service: Orthopedics;  Laterality: Right;  with ISB   ROTATOR CUFF REPAIR Right 01/10/2012   per Dr. Marionette Sick, also repair torn biceps tendon    SHOULDER SURGERY Right    STERIOD INJECTION Left 07/27/2013   Procedure: STEROID INJECTION;  Surgeon: Lorriane Rote, MD;  Location: [facility] OR;  Service: Orthopedics;  Laterality: Left;   TONSILLECTOMY     TRIGGER FINGER RELEASE Right 07/27/2013   Procedure: RIGHT RING FINGER A-1 RELEASE;  Surgeon: Lorriane Rote, MD;  Location: [facility] OR;  Service: Orthopedics;  Laterality: Right;   URETERAL STENT PLACEMENT  07/2010   left ureter, per Dr. Derrick Fling, for a stone     Family History  Problem Relation Age of Onset   Colon cancer Father    Heart attack Father    Heart disease Brother    Heart disease Brother    Esophageal cancer Neg Hx    Rectal cancer Neg Hx    Stomach cancer Neg Hx     Social History   Socioeconomic History   Marital status: Married    Spouse name: Not on file   Number of children: Not on file   Years of education: Not on file   Highest education level: Not on file  Occupational History   Occupation: Retired  Tobacco Use   Smoking status: Former    Current packs/day: 0.00    Average packs/day: 1.5 packs/day for 20.0 years (30.0 ttl pk-yrs)    Types: Cigarettes    Start date: 03/03/1953    Quit date: 03/03/1973    Years since quitting: 50.2    Passive exposure: Past   Smokeless tobacco: Never   Tobacco comments:    discussed AAA; did have CT of abd/ pelvis   Vaping Use   Vaping status: Never Used  Substance and Sexual Activity   Alcohol use: No    Alcohol/week: 0.0 standard drinks of alcohol   Drug use: No   Sexual activity: Not on file  Other Topics Concern   Not on file  Social History Narrative   Not on file   Social Drivers of Health   Financial Resource Strain: Low Risk  (07/31/2021)   Overall Financial Resource Strain (CARDIA)    Difficulty of Paying Living Expenses: Not hard at all  Food Insecurity: No Food Insecurity (07/31/2021)   Hunger Vital Sign    Worried About Running Out of Food in the Last Year: Never true    Ran Out of Food in the Last Year: Never true  Transportation Needs: No Transportation Needs (07/31/2021)   PRAPARE - Administrator, Civil Service (Medical): No    Lack of Transportation (Non-Medical): No  Physical Activity: Insufficiently Active (07/31/2021)   Exercise Vital Sign    Days of Exercise per Week: 2 days     Minutes of Exercise per Session: 40 min  Stress: No Stress Concern Present (07/31/2021)   [employer] of Occupational Health - Occupational Stress Questionnaire    Feeling of Stress : Not at all  Social Connections: Socially Integrated (07/31/2021)   Social Connection and Isolation Panel [NHANES]    Frequency of Communication with Friends and Family: More than three times a week  Frequency of Social Gatherings with Friends and Family: More than three times a week    Attends Religious Services: More than 4 times per year    Active Member of [employer] or Organizations: Not on file    Attends Banker Meetings: More than 4 times per year    Marital Status: Married  Catering manager Violence: Not At Risk (07/31/2021)   Humiliation, Afraid, Rape, and Kick questionnaire    Fear of Current or Ex-Partner: No    Emotionally Abused: No    Physically Abused: No    Sexually Abused: No      Review of systems: All other review of systems negative except as mentioned in the HPI.   Physical Exam: Vitals:   05/18/23 1031  BP: 126/68  Pulse: 70  SpO2: 98%   Body mass index is 27.95 kg/m. Gen:      No acute distress HEENT:  sclera anicteric CV: s1s2 rrr, no murmur Lungs: B/l clear. Abd:      soft, non-tender; no palpable masses, no distension Ext:    No edema Neuro: alert and oriented x 3 Psych: normal mood and affect  Data Reviewed:  Reviewed labs, radiology imaging, old records and pertinent past GI work up     Assessment and Plan Assessment & Plan Diverticulitis Acute diverticulitis confirmed by CT scan in the sigmoid colon without significant complications such as abscess formation. Symptoms include abdominal pain and tenderness, improving with antibiotic therapy. Recurrent episodes raise concern for potential future episodes and consideration of surgical intervention if episodes persist. Advised to avoid popcorn as it may trigger episodes based on anecdotal  experience. - Continue antibiotics for a total of 10 days - Advise soft diet for one week, avoiding red meat and raw vegetables - Avoid popcorn as it may trigger episodes - Schedule follow-up in a couple of months to reassess condition Will plan for follow-up colonoscopy to exclude any neoplastic lesion, he has family history of colon cancer and personal history of adenomatous colon polyps, last colonoscopy in January 2023, 3 adenomatous colon polyps were removed, will be due for surveillance in January 2026 otherwise  Internal hemorrhoids with bleeding Bleeding internal hemorrhoid confirmed via anoscopy with bright red blood, likely exacerbated by current diverticulitis episode. Hemorrhoid is moderate in size and causing significant bleeding. He prefers suppositories over foam for treatment. - Prescribe suppositories for hemorrhoid bleeding, to be used twice daily for 2-3 days - Consider hemorrhoid removal if symptoms persist after diverticulitis resolves He is on chronic anticoagulation with Xarelto  - Schedule follow-up in a few weeks to reassess hemorrhoid condition  Gastroesophageal reflux disease (GERD) Persistent GERD symptoms, including acid reflux and dysphagia, despite new medication. Symptoms are exacerbated by current antibiotic treatment for diverticulitis. Hiatal hernia contributing to GERD symptoms, described as an hourglass shape causing a pinching sensation. - Resume previous medication Aciphex  20 mg twice daily for GERD as it was more effective - Discontinue current GERD medication pantoprazole  40 mg twice daily  Return in 6 to 8 weeks    This visit required 40 minutes of patient care (this includes precharting, chart review, review of results, face-to-face time used for counseling as well as treatment plan and follow-up. The patient was provided an opportunity to ask questions and all were answered. The patient agreed with the plan and demonstrated an understanding of the  instructions.  Lorena Rolling , MD    CC: Donley Furth, MD

## 2023-05-18 NOTE — Patient Instructions (Signed)
 VISIT SUMMARY:  Today, we discussed your recent symptoms of rectal bleeding and abdominal pain related to your diverticulitis. We also addressed your ongoing issues with acid reflux and difficulty swallowing. A treatment plan was established to manage these conditions and improve your overall health.  YOUR PLAN:  -DIVERTICULITIS: Diverticulitis is an inflammation or infection of small pouches that can form in your intestines. You should continue taking your antibiotics for a total of 10 days. Stick to a soft diet for one week, avoiding red meat and raw vegetables. Avoid popcorn as it may trigger episodes. We will reassess your condition in a couple of months.  -INTERNAL HEMORRHOIDS WITH BLEEDING: Internal hemorrhoids are swollen veins in your rectum that can cause bleeding. You should use the prescribed suppositories twice daily for 2-3 days. If symptoms persist after your diverticulitis resolves, we may consider hemorrhoid removal. We will reassess your hemorrhoid condition in a few weeks.  -GASTROESOPHAGEAL REFLUX DISEASE (GERD): GERD is a condition where stomach acid frequently flows back into the tube connecting your mouth and stomach, causing discomfort. You should resume your previous medication for GERD as it was more effective and discontinue the current medication.  INSTRUCTIONS:  Please follow up in a couple of months to reassess your diverticulitis condition and in a few weeks to reassess your hemorrhoid condition.

## 2023-07-05 ENCOUNTER — Encounter: Payer: Self-pay | Admitting: *Deleted

## 2023-07-05 ENCOUNTER — Ambulatory Visit: Admitting: Gastroenterology

## 2023-07-05 ENCOUNTER — Telehealth: Payer: Self-pay | Admitting: *Deleted

## 2023-07-05 ENCOUNTER — Encounter: Payer: Self-pay | Admitting: Gastroenterology

## 2023-07-05 VITALS — BP 124/70 | HR 65 | Ht 69.5 in | Wt 190.0 lb

## 2023-07-05 DIAGNOSIS — Z860101 Personal history of adenomatous and serrated colon polyps: Secondary | ICD-10-CM | POA: Diagnosis not present

## 2023-07-05 DIAGNOSIS — K649 Unspecified hemorrhoids: Secondary | ICD-10-CM

## 2023-07-05 DIAGNOSIS — K641 Second degree hemorrhoids: Secondary | ICD-10-CM

## 2023-07-05 DIAGNOSIS — Z860100 Personal history of colon polyps, unspecified: Secondary | ICD-10-CM

## 2023-07-05 DIAGNOSIS — Z8719 Personal history of other diseases of the digestive system: Secondary | ICD-10-CM

## 2023-07-05 DIAGNOSIS — Z800 Family history of malignant neoplasm of digestive organs: Secondary | ICD-10-CM | POA: Diagnosis not present

## 2023-07-05 DIAGNOSIS — K625 Hemorrhage of anus and rectum: Secondary | ICD-10-CM

## 2023-07-05 MED ORDER — NA SULFATE-K SULFATE-MG SULF 17.5-3.13-1.6 GM/177ML PO SOLN: 1.0000 | Freq: Once | ORAL | 0 refills | Status: AC

## 2023-07-05 NOTE — Telephone Encounter (Signed)
 Informed patient ok to hold Xarelto  2 days before his procedure and to mark it on his calendar

## 2023-07-05 NOTE — Telephone Encounter (Signed)
  Steven [name] 25-Oct-1950 [id number]  07/05/2023   Dear Dr Garnette Olmsted:  We have scheduled the above named patient for a(n) Colonoscopy procedure. Our records show that he is on anticoagulation therapy.  Please advise as to whether the patient may come off their therapy of Xarelto  2 days prior to their procedure which is scheduled for 08/25/2023.  Please route your response to Grayce S or fax response to [phone].  Sincerely,    [facility]

## 2023-07-05 NOTE — Progress Notes (Signed)
 Steven [name]    [id number]    06-Jan-1951  Primary Care Physician:Fry, Garnette LABOR, MD  Referring Physician: Johnny Garnette LABOR, MD [address] [place],  [place] [address]   Chief complaint: Hemorrhoids, history of diverticulitis  Discussed the use of AI scribe software for clinical note transcription with the patient, who gave verbal consent to proceed.  History of Present Illness Steven [name] is a 73 year old male with diverticulitis who presents for follow-up regarding abdominal pain and rectal bleeding.  Abdominal pain has improved significantly since the last visit. The pain is sharp, occurs occasionally, and is less frequent and severe than before. It subsides with ambulation and is rated as 2 out of 10, compared to the worst pain experienced during a diverticulitis episode.  Bowel movements have returned to a normal pattern, occurring daily or every other day. He experienced a small episode of rectal bleeding this past weekend, either on Saturday or Sunday, which was less than before and not accompanied by significant pain.  He underwent hemorrhoid banding previously on one of the bleeding hemorrhoids. Suppositories are used as needed, particularly after episodes of bleeding.  He recalls a past episode of diverticulitis during which polyps were removed.  He is mindful of hydration, drinking at least eighty ounces of water  daily, especially during hot weather. He mows his lawn twice a week due to rapid grass growth, being cautious about doing so in the heat.   GI Hx: CT abdomen pelvis with contrast 05/17/23 1. Mild mid to distal acute sigmoid diverticulitis. No extraluminal gas or abscess. 2. Mild circumferential distal esophageal wall thickening, nonspecific although esophagitis would be a common cause. 3. 2 mm left kidney lower pole nonobstructive renal calculus. 4. 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 5. Mild lumbar  spondylosis and degenerative disc disease.    Colonoscopy 02/10/21 - Three 1 to 3 mm polyps in the transverse colon and in the cecum, removed with a cold snare. Resected and retrieved. - Diverticulosis in the entire examined colon. - Internal hemorrhoids. - The examination was otherwise normal on direct and retroflexion views. Surgical [P], colon, transverse and cecum, polyp (3) - TUBULAR ADENOMA(S) WITHOUT HIGH-GRADE DYSPLASIA OR MALIGNANCY - HYPERPLASTIC POLYP   EGD April 04, 2023 - No endoscopic esophageal abnormality to explain patient' s dysphagia. Esophagus dilated. Dilated to 20 mm with balloon. - Small hiatal hernia. - Normal examined duodenum  Outpatient Encounter Medications as of 07/05/2023  Medication Sig   acetaminophen  (TYLENOL ) 650 MG CR tablet Take 650-1,300 mg by mouth every 8 (eight) hours as needed for pain.   Ascorbic Acid  (VITAMIN C PO) Take by mouth.   Cholecalciferol  (VITAMIN D) 50 MCG (2000 UT) tablet Take 2,000 Units by mouth daily.   clobetasol  cream (TEMOVATE ) 0.05 % Apply 1 application. topically 2 (two) times daily as needed for itching.   ezetimibe  (ZETIA ) 10 MG tablet Take 1 tablet (10 mg total) by mouth daily.   fenofibrate  (TRICOR ) 145 MG tablet Take 1 tablet (145 mg total) by mouth every evening.   fluticasone  (FLONASE ) 50 MCG/ACT nasal spray Place 1 spray into both nostrils daily as needed for allergies or rhinitis.   folic acid  (FOLVITE ) 800 MCG tablet Take 800 mcg by mouth daily.   hydrocortisone  (ANUSOL -HC) 25 MG suppository Place 1 suppository (25 mg total) rectally at bedtime.   loratadine  (CLARITIN ) 10 MG tablet Take 10 mg by mouth daily  as needed for allergies.   losartan  (COZAAR ) 50 MG tablet Take 1 tablet (50 mg total) by mouth daily.   magic mouthwash (nystatin , hydrocortisone , diphenhydrAMINE ) suspension Swish and spit 5 mLs 4 (four) times daily as needed for mouth pain.   methocarbamol  (ROBAXIN ) 500 MG tablet Take 1 tablet (500 mg total) by mouth  every 6 (six) hours as needed for muscle spasms.   Multiple Vitamin (MULTIVITAMIN WITH MINERALS) TABS tablet Take 1 tablet by mouth daily.   RABEprazole  (ACIPHEX ) 20 MG tablet Take 1 tablet (20 mg total) by mouth 2 (two) times daily.   rivaroxaban  (XARELTO ) 20 MG TABS tablet Take 1 tablet (20 mg total) by mouth daily with supper.   temazepam  (RESTORIL ) 30 MG capsule Take 1 capsule (30 mg total) by mouth at bedtime as needed for sleep.   zinc  gluconate 50 MG tablet Take 100 mg by mouth daily.   amoxicillin -clavulanate (AUGMENTIN ) 875-125 MG tablet Take 1 tablet by mouth 2 (two) times daily. (Patient not taking: Reported on 07/05/2023)   No facility-administered encounter medications on file as of 07/05/2023.    Allergies as of 07/05/2023 - Review Complete 07/05/2023  Allergen Reaction Noted   Morphine Other (See Comments)    Levofloxacin  Swelling 04/16/2016   Amitriptyline hcl Other (See Comments)    Amoxicillin -pot clavulanate Nausea Only 01/18/2007   Cyclobenzaprine  hcl Other (See Comments) 07/31/2008   Lisinopril -hydrochlorothiazide  Other (See Comments) 05/24/2012    Past Medical History:  Diagnosis Date   Arthritis    Clotting disorder (HCC)    Displacement of intervertebral disc, site unspecified, without myelopathy    Esophageal reflux    Headache(784.0)    sinus and migraines   Herpes simplex without mention of complication    Hiatal hernia    HTN (hypertension) 05/10/2012   Hyperlipemia    Hypertrophy (benign) of prostate    Intestinal infection due to other organism, not elsewhere classified    Kidney stones    sees Dr. Nieves   Personal history of colonic polyps    Personal history of urinary calculi    Personal history of venous thrombosis and embolism    Renal insufficiency     Past Surgical History:  Procedure Laterality Date   APPENDECTOMY     BOTOX  INJECTION N/A 04/13/2012   Procedure: BOTOX  INJECTION;  Surgeon: Toribio SHAUNNA Cedar, MD;  Location: [facility];  Service: Endoscopy;  Laterality: N/A;   CERVICAL DISC SURGERY  1996 and 1998   CHOLECYSTECTOMY     COLONOSCOPY  02/10/2021   per Dr. Cedar an polyps, widespread diverticula, repeat in 5 yrs   ESOPHAGEAL MANOMETRY N/A 02/26/2013   Procedure: ESOPHAGEAL MANOMETRY (EM);  Surgeon: Toribio SHAUNNA Cedar, MD;  Location: [facility];  Service: Endoscopy;  Laterality: N/A;   ESOPHAGEAL MANOMETRY N/A 11/03/2018   Procedure: ESOPHAGEAL MANOMETRY (EM);  Surgeon: Cedar Toribio SHAUNNA, MD;  Location: [facility];  Service: Endoscopy;  Laterality: N/A;   ESOPHAGOGASTRODUODENOSCOPY  02/10/2021   per Dr. Cedar, mod hiatal hernia, one ring was dilated   ESOPHAGOGASTRODUODENOSCOPY (EGD) WITH ESOPHAGEAL DILATION N/A 04/13/2012   Procedure: ESOPHAGOGASTRODUODENOSCOPY (EGD) WITH ESOPHAGEAL DILATION;  Surgeon: Toribio SHAUNNA Cedar, MD;  Location: [facility];  Service: Endoscopy;  Laterality: N/A;  possible botox  injection   EXTRACORPOREAL SHOCK WAVE LITHOTRIPSY Left 08/14/2020   Procedure: LEFT EXTRACORPOREAL SHOCK WAVE LITHOTRIPSY (ESWL);  Surgeon: Rosalind Zachary NOVAK, MD;  Location: [facility];  Service: Urology;  Laterality: Left;   FINGER SURGERY     3rd finger  trigger bilateral    GALLBLADDER SURGERY     LITHOTRIPSY     REVERSE SHOULDER ARTHROPLASTY Right 06/05/2021   Procedure: REVERSE SHOULDER ARTHROPLASTY;  Surgeon: Kay Kemps, MD;  Location: [facility];  Service: Orthopedics;  Laterality: Right;  with ISB   ROTATOR CUFF REPAIR Right 01/10/2012   per Dr. Elspeth Kay, also repair torn biceps tendon    SHOULDER SURGERY Right    STERIOD INJECTION Left 07/27/2013   Procedure: STEROID INJECTION;  Surgeon: Elspeth JONELLE Kay, MD;  Location: [facility] OR;  Service: Orthopedics;  Laterality: Left;   TONSILLECTOMY     TRIGGER FINGER RELEASE Right 07/27/2013   Procedure: RIGHT RING FINGER A-1 RELEASE;  Surgeon: Elspeth JONELLE Kay, MD;  Location: [facility] OR;  Service: Orthopedics;  Laterality: Right;    URETERAL STENT PLACEMENT  07/2010   left ureter, per Dr. Nieves, for a stone     Family History  Problem Relation Age of Onset   Colon cancer Father    Heart attack Father    Heart disease Brother    Heart disease Brother    Esophageal cancer Neg Hx    Rectal cancer Neg Hx    Stomach cancer Neg Hx     Social History   Socioeconomic History   Marital status: Married    Spouse name: Not on file   Number of children: Not on file   Years of education: Not on file   Highest education level: Not on file  Occupational History   Occupation: Retired  Tobacco Use   Smoking status: Former    Current packs/day: 0.00    Average packs/day: 1.5 packs/day for 20.0 years (30.0 ttl pk-yrs)    Types: Cigarettes    Start date: 03/03/1953    Quit date: 03/03/1973    Years since quitting: 50.3    Passive exposure: Past   Smokeless tobacco: Never   Tobacco comments:    discussed AAA; did have CT of abd/ pelvis   Vaping Use   Vaping status: Never Used  Substance and Sexual Activity   Alcohol use: No    Alcohol/week: 0.0 standard drinks of alcohol   Drug use: No   Sexual activity: Not on file  Other Topics Concern   Not on file  Social History Narrative   Not on file   Social Drivers of Health   Financial Resource Strain: Low Risk  (07/31/2021)   Overall Financial Resource Strain (CARDIA)    Difficulty of Paying Living Expenses: Not hard at all  Food Insecurity: No Food Insecurity (07/31/2021)   Hunger Vital Sign    Worried About Running Out of Food in the Last Year: Never true    Ran Out of Food in the Last Year: Never true  Transportation Needs: No Transportation Needs (07/31/2021)   PRAPARE - Administrator, Civil Service (Medical): No    Lack of Transportation (Non-Medical): No  Physical Activity: Insufficiently Active (07/31/2021)   Exercise Vital Sign    Days of Exercise per Week: 2 days    Minutes of Exercise per Session: 40 min  Stress: No Stress Concern  Present (07/31/2021)   [employer] of Occupational Health - Occupational Stress Questionnaire    Feeling of Stress : Not at all  Social Connections: Socially Integrated (07/31/2021)   Social Connection and Isolation Panel    Frequency of Communication with Friends and Family: More than three times a week    Frequency of Social Gatherings with Friends and Family: More  than three times a week    Attends Religious Services: More than 4 times per year    Active Member of Clubs or Organizations: Not on file    Attends Club or Organization Meetings: More than 4 times per year    Marital Status: Married  Catering manager Violence: Not At Risk (07/31/2021)   Humiliation, Afraid, Rape, and Kick questionnaire    Fear of Current or Ex-Partner: No    Emotionally Abused: No    Physically Abused: No    Sexually Abused: No      Review of systems: All other review of systems negative except as mentioned in the HPI.   Physical Exam: There were no vitals filed for this visit. Body mass index is 27.66 kg/m. Gen:      No acute distress HEENT:  sclera anicteric CV: s1s2 rrr, no murmur Lungs: B/l clear. Abd:      soft, non-tender; no palpable masses, no distension Ext:    No edema Neuro: alert and oriented x 3 Psych: normal mood and affect  Data Reviewed:  Reviewed labs, radiology imaging, old records and pertinent past GI work up     Assessment and Plan Assessment & Plan Diverticulitis Diverticulitis episode has improved significantly with pain now rated at 2/10 at worst and bowel habits returning to normal. Occasional sharp pains that resolve quickly. No need for CT scan at this time due to improvement and low pain severity.  Will plan for follow-up colonoscopy to exclude any neoplastic lesion, he has family history of colon cancer and personal history of adenomatous colon polyps, last colonoscopy in January 2023, 3 adenomatous colon polyps were removed  - Schedule colonoscopy on  Thursday, August 14th at 8:00 AM. - Hold off on CT scan unless pain becomes persistent or worsens. - Advise to call if symptoms worsen or if there are changes in bowel habits or increased bleeding.  Hemorrhoids Hemorrhoid banding was previously performed on one bleeding hemorrhoid. Occasional bleeding episodes persist but are less frequent and less painful. Plan to address remaining hemorrhoids after colonoscopy due to potential irritation from prep. Suppositories are available for use as needed . - Hold off on further hemorrhoid banding until after colonoscopy. - Encourage maintaining regular bowel habits and adequate hydration, aiming for at least 80 ounces of water  daily.  The patient was provided an opportunity to ask questions and all were answered. The patient agreed with the plan and demonstrated an understanding of the instructions.  LOIS Wilkie Mcgee , MD    CC: Johnny Garnette LABOR, MD

## 2023-07-05 NOTE — Telephone Encounter (Signed)
 Yes he can hold the Xarelto  for 2 days prior to the colonoscopy

## 2023-07-05 NOTE — Patient Instructions (Addendum)
 VISIT SUMMARY:  You came in today for a follow-up regarding your abdominal pain and rectal bleeding. Your abdominal pain has improved significantly, and your bowel movements have returned to a normal pattern. You had a small episode of rectal bleeding this past weekend, but it was less severe than before. You are staying hydrated and being cautious with your activities.  YOUR PLAN:  -DIVERTICULITIS: Diverticulitis is an inflammation or infection of small pouches that can form in your intestines. Your condition has improved significantly, with pain now rated at 2/10 at worst and normal bowel habits. We will schedule a colonoscopy within the next few weeks to ensure there are no underlying issues like polyps. No need for a CT scan unless your pain becomes persistent or worsens. Please call if your symptoms worsen or if there are changes in your bowel habits or increased bleeding.  -HEMORRHOIDS: Hemorrhoids are swollen veins in your lower rectum or anus. You had a banding procedure done previously, and while occasional bleeding episodes persist, they are less frequent and less painful. We will address the remaining hemorrhoids after your colonoscopy to avoid irritation from the prep. You have refills on suppositories to use as needed during bleeding episodes. Continue to maintain regular bowel habits and stay hydrated, aiming for at least 80 ounces of water  daily.  INSTRUCTIONS:  Please schedule your colonoscopy on Thursday, August 14th at 8:00 AM. Hold off on further hemorrhoid banding until after the colonoscopy. Call us  if your symptoms worsen or if there are changes in your bowel habits or increased bleeding.  You will be contacted by our office prior to your procedure for directions on holding your Xarelto .  If you do not hear from our office 1 week prior to your scheduled procedure, please call [phone] to discuss.     You have been scheduled for a colonoscopy. Please follow written  instructions given to you at your visit today.   If you use inhalers (even only as needed), please bring them with you on the day of your procedure.  DO NOT TAKE 7 DAYS PRIOR TO TEST- Trulicity (dulaglutide) Ozempic, Wegovy (semaglutide) Mounjaro (tirzepatide) Bydureon Bcise (exanatide extended release)  DO NOT TAKE 1 DAY PRIOR TO YOUR TEST Rybelsus (semaglutide) Adlyxin (lixisenatide) Victoza (liraglutide) Byetta (exanatide) ___________________________________________________________________________  Due to recent changes in healthcare laws, you may see the results of your imaging and laboratory studies on MyChart before your provider has had a chance to review them.  We understand that in some cases there may be results that are confusing or concerning to you. Not all laboratory results come back in the same time frame and the provider may be waiting for multiple results in order to interpret others.  Please give us  48 hours in order for your provider to thoroughly review all the results before contacting the office for clarification of your results.    I appreciate the  opportunity to care for you  Thank You   Kavitha Nandigam , MD

## 2023-07-07 ENCOUNTER — Telehealth: Payer: Self-pay | Admitting: Gastroenterology

## 2023-07-07 MED ORDER — RABEPRAZOLE SODIUM 20 MG PO TBEC: 20.0000 mg | DELAYED_RELEASE_TABLET | Freq: Two times a day (BID) | ORAL | 3 refills | Status: AC

## 2023-07-07 MED ORDER — RABEPRAZOLE SODIUM 20 MG PO TBEC: 20.0000 mg | DELAYED_RELEASE_TABLET | Freq: Two times a day (BID) | ORAL | 3 refills | Status: DC

## 2023-07-07 NOTE — Telephone Encounter (Signed)
 Refilled patients Aciphex , Accidentally sent to patients local pharmacy first but then sent to [facility].

## 2023-07-07 NOTE — Telephone Encounter (Signed)
 Inbound call from patient stated he is in need of a refill for Rabeprazole  medication. Sent to [facility]   Please advise  Thank you

## 2023-07-13 DIAGNOSIS — Z96611 Presence of right artificial shoulder joint: Secondary | ICD-10-CM | POA: Diagnosis not present

## 2023-07-13 DIAGNOSIS — M7542 Impingement syndrome of left shoulder: Secondary | ICD-10-CM | POA: Diagnosis not present

## 2023-07-13 DIAGNOSIS — R311 Benign essential microscopic hematuria: Secondary | ICD-10-CM | POA: Diagnosis not present

## 2023-07-14 ENCOUNTER — Telehealth: Payer: Self-pay

## 2023-07-14 NOTE — Telephone Encounter (Signed)
 Copied from CRM [id number]. Topic: Clinical - Medication Question >> Jul 14, 2023 11:22 AM Revonda D wrote: Reason for CRM: Pt's wife Rojelio is calling to check the medication refill request sent from the pharmacy for the rivaroxaban  (XARELTO ) 20 MG TABS tablet. Rojelio stated that the pharmacy informed her that they haven't received an approval from the office as of yet. Rojelio would like to have the medication approved and sent to the [facility] mail order pharmacy.Wife would like a callback with an update on this request.

## 2023-07-14 NOTE — Telephone Encounter (Signed)
 Spoke with pt pharmacy agent stated that they did not received the refill for pt Xarelto  dated 07/09/23, Verbal Order given as prescribed.

## 2023-08-17 ENCOUNTER — Encounter: Payer: Self-pay | Admitting: Gastroenterology

## 2023-08-25 ENCOUNTER — Ambulatory Visit (AMBULATORY_SURGERY_CENTER): Admitting: Gastroenterology

## 2023-08-25 ENCOUNTER — Encounter: Payer: Self-pay | Admitting: Gastroenterology

## 2023-08-25 ENCOUNTER — Other Ambulatory Visit (HOSPITAL_COMMUNITY): Payer: Self-pay

## 2023-08-25 VITALS — BP 116/61 | HR 61 | Temp 98.6°F | Resp 15 | Ht 69.0 in | Wt 190.0 lb

## 2023-08-25 DIAGNOSIS — K5732 Diverticulitis of large intestine without perforation or abscess without bleeding: Secondary | ICD-10-CM

## 2023-08-25 DIAGNOSIS — K5730 Diverticulosis of large intestine without perforation or abscess without bleeding: Secondary | ICD-10-CM | POA: Diagnosis not present

## 2023-08-25 DIAGNOSIS — Z800 Family history of malignant neoplasm of digestive organs: Secondary | ICD-10-CM | POA: Diagnosis not present

## 2023-08-25 DIAGNOSIS — Z860101 Personal history of adenomatous and serrated colon polyps: Secondary | ICD-10-CM | POA: Diagnosis not present

## 2023-08-25 DIAGNOSIS — Z860100 Personal history of colon polyps, unspecified: Secondary | ICD-10-CM

## 2023-08-25 DIAGNOSIS — K648 Other hemorrhoids: Secondary | ICD-10-CM

## 2023-08-25 DIAGNOSIS — K644 Residual hemorrhoidal skin tags: Secondary | ICD-10-CM | POA: Diagnosis not present

## 2023-08-25 DIAGNOSIS — D123 Benign neoplasm of transverse colon: Secondary | ICD-10-CM

## 2023-08-25 DIAGNOSIS — I10 Essential (primary) hypertension: Secondary | ICD-10-CM | POA: Diagnosis not present

## 2023-08-25 DIAGNOSIS — D125 Benign neoplasm of sigmoid colon: Secondary | ICD-10-CM

## 2023-08-25 DIAGNOSIS — Z1211 Encounter for screening for malignant neoplasm of colon: Secondary | ICD-10-CM | POA: Diagnosis not present

## 2023-08-25 MED ORDER — SODIUM CHLORIDE 0.9 % IV SOLN: 500.0000 mL | INTRAVENOUS | Status: AC

## 2023-08-25 MED ORDER — DICYCLOMINE HCL 10 MG PO CAPS: 10.0000 mg | ORAL_CAPSULE | Freq: Every day | ORAL | 3 refills | Status: DC | PRN

## 2023-08-25 MED ORDER — DICYCLOMINE HCL 10 MG PO CAPS
10.0000 mg | ORAL_CAPSULE | Freq: Every day | ORAL | 3 refills | Status: AC | PRN
  Filled 2023-08-25: qty 30, 30d supply, fill #0

## 2023-08-25 NOTE — Progress Notes (Signed)
 Called to room to assist during endoscopic procedure.  Patient ID and intended procedure confirmed with present staff. Received instructions for my participation in the procedure from the performing physician.

## 2023-08-25 NOTE — Patient Instructions (Addendum)
 Resume previous diet. Continue present medications. Resume Xarelto  at prior dose tomorrow. Refer to managing physician for further adjustment of therapy.  Dicyclomine  10  mg daily prn.  Awaiting pathology results. Repeat colonoscopy in 3-5 years for surveillance based on pathology results. Handouts provided on polyps, diverticulosis, and hemorrhoids.   YOU HAD AN ENDOSCOPIC PROCEDURE TODAY AT THE [facility]:   Refer to the procedure report that was given to you for any specific questions about what was found during the examination.  If the procedure report does not answer your questions, please call your gastroenterologist to clarify.  If you requested that your care partner not be given the details of your procedure findings, then the procedure report has been included in a sealed envelope for you to review at your convenience later.  YOU SHOULD EXPECT: Some feelings of bloating in the abdomen. Passage of more gas than usual.  Walking can help get rid of the air that was put into your GI tract during the procedure and reduce the bloating. If you had a lower endoscopy (such as a colonoscopy or flexible sigmoidoscopy) you may notice spotting of blood in your stool or on the toilet paper. If you underwent a bowel prep for your procedure, you may not have a normal bowel movement for a few days.  Please Note:  You might notice some irritation and congestion in your nose or some drainage.  This is from the oxygen used during your procedure.  There is no need for concern and it should clear up in a day or so.  SYMPTOMS TO REPORT IMMEDIATELY:  Following lower endoscopy (colonoscopy or flexible sigmoidoscopy):  Excessive amounts of blood in the stool  Significant tenderness or worsening of abdominal pains  Swelling of the abdomen that is new, acute  Fever of 100F or higher  For urgent or emergent issues, a gastroenterologist can be reached at any hour by calling [phone]. Do not  use MyChart messaging for urgent concerns.    DIET:  We do recommend a small meal at first, but then you may proceed to your regular diet.  Drink plenty of fluids but you should avoid alcoholic beverages for 24 hours.  ACTIVITY:  You should plan to take it easy for the rest of today and you should NOT DRIVE or use heavy machinery until tomorrow (because of the sedation medicines used during the test).    FOLLOW UP: Our staff will call the number listed on your records the next business day following your procedure.  We will call around 7:15- 8:00 am to check on you and address any questions or concerns that you may have regarding the information given to you following your procedure. If we do not reach you, we will leave a message.     If any biopsies were taken you will be contacted by phone or by letter within the next 1-3 weeks.  Please call us  at [phone] if you have not heard about the biopsies in 3 weeks.    SIGNATURES/CONFIDENTIALITY: You and/or your care partner have signed paperwork which will be entered into your electronic medical record.  These signatures attest to the fact that that the information above on your After Visit Summary has been reviewed and is understood.  Full responsibility of the confidentiality of this discharge information lies with you and/or your care-partner.

## 2023-08-25 NOTE — Op Note (Addendum)
 [facility] Patient Name: Steven [name] Procedure Date: 08/25/2023 8:42 AM MRN: [id number] Endoscopist: Steven [name] , MD, [id number] Age: 73 Referring MD:  Date of Birth: 05/30/1950 Gender: Male Account #: [id number] Procedure:                Colonoscopy Indications:              Follow-up of diverticulitis. H/o multiple                            adenomatous colon polyps and family h/o colon cancer Medicines:                Monitored Anesthesia Care Procedure:                Pre-Anesthesia Assessment:                           - Prior to the procedure, a History and Physical                            was performed, and patient medications and                            allergies were reviewed. The patient's tolerance of                            previous anesthesia was also reviewed. The risks                            and benefits of the procedure and the sedation                            options and risks were discussed with the patient.                            All questions were answered, and informed consent                            was obtained. Prior Anticoagulants: The patient                            last took Xarelto  (rivaroxaban ) 2 days prior to the                            procedure. ASA Grade Assessment: II - A patient                            with mild systemic disease. After reviewing the                            risks and benefits, the patient was deemed in                            satisfactory condition to undergo the procedure.  After obtaining informed consent, the colonoscope                            was passed under direct vision. Throughout the                            procedure, the patient's blood pressure, pulse, and                            oxygen saturations were monitored continuously. The                            Olympus Scope [id number] was introduced through the                             anus and advanced to the the cecum, identified by                            appendiceal orifice and ileocecal valve. The                            colonoscopy was performed without difficulty. The                            patient tolerated the procedure well. The quality                            of the bowel preparation was good. The ileocecal                            valve, appendiceal orifice, and rectum were                            photographed. Scope In: 8:56:25 AM Scope Out: 9:14:33 AM Scope Withdrawal Time: 0 hours 12 minutes 25 seconds  Total Procedure Duration: 0 hours 18 minutes 8 seconds  Findings:                 The perianal and digital rectal examinations were                            normal.                           Three sessile polyps were found in the sigmoid                            colon and transverse colon. The polyps were 3 to 5                            mm in size. These polyps were removed with a cold                            snare. Resection and retrieval were complete.  Scattered large-mouthed, medium-mouthed and                            small-mouthed diverticula were found in the sigmoid                            colon, descending colon, transverse colon,                            ascending colon and cecum.                           Non-bleeding external and internal hemorrhoids were                            found during retroflexion. The hemorrhoids were                            small. Complications:            No immediate complications. Estimated Blood Loss:     Estimated blood loss was minimal. Impression:               - Three 3 to 5 mm polyps in the sigmoid colon and                            in the transverse colon, removed with a cold snare.                            Resected and retrieved.                           - Severe diverticulosis in the sigmoid colon, in                            the  descending colon, in the transverse colon, in                            the ascending colon and in the cecum.                           - Non-bleeding external and internal hemorrhoids. Recommendation:           - Patient has a contact number available for                            emergencies. The signs and symptoms of potential                            delayed complications were discussed with the                            patient. Return to normal activities tomorrow.                            Written discharge instructions were  provided to the                            patient.                           - Resume previous diet.                           - Continue present medications.                           - Await pathology results.                           - Repeat colonoscopy in 3 - 5 years for                            surveillance based on pathology results.                           - Resume Xarelto  (rivaroxaban ) at prior dose                            tomorrow. Refer to managing physician for further                            adjustment of therapy.                           - Dicyclomine  10mg  daily prn , Rx for 30 with 3                            refills Steven Brenner [name], MD 08/25/2023 9:24:16 AM This report has been signed electronically.

## 2023-08-25 NOTE — Progress Notes (Signed)
 [facility] History and Physical   Primary Care Physician:  Johnny Garnette LABOR, MD   Reason for Procedure:  Follow up of diverticulitis, family h/o colon cancer and history of adenomatous colon polyps  Plan:    Surveillance colonoscopy with possible interventions as needed     HPI: Steven [name] is a very pleasant 73 y.o. male here for surveillance colonoscopy for follow up of diverticulitis, family h/o colon cancer and history of adenomatous colon polyps.   The risks and benefits as well as alternatives of endoscopic procedure(s) have been discussed and reviewed. All questions answered. The patient agrees to proceed.    Past Medical History:  Diagnosis Date   Arthritis    Clotting disorder (HCC)    Displacement of intervertebral disc, site unspecified, without myelopathy    Esophageal reflux    Headache(784.0)    sinus and migraines   Herpes simplex without mention of complication    Hiatal hernia    HTN (hypertension) 05/10/2012   Hyperlipemia    Hypertrophy (benign) of prostate    Intestinal infection due to other organism, not elsewhere classified    Kidney stones    sees Dr. Nieves   Personal history of colonic polyps    Personal history of urinary calculi    Personal history of venous thrombosis and embolism    Renal insufficiency     Past Surgical History:  Procedure Laterality Date   APPENDECTOMY     BOTOX  INJECTION N/A 04/13/2012   Procedure: BOTOX  INJECTION;  Surgeon: Toribio SHAUNNA Cedar, MD;  Location: [facility];  Service: Endoscopy;  Laterality: N/A;   CERVICAL DISC SURGERY  1996 and 1998   CHOLECYSTECTOMY     COLONOSCOPY  02/10/2021   per Dr. Cedar an polyps, widespread diverticula, repeat in 5 yrs   ESOPHAGEAL MANOMETRY N/A 02/26/2013   Procedure: ESOPHAGEAL MANOMETRY (EM);  Surgeon: Toribio SHAUNNA Cedar, MD;  Location: [facility];  Service: Endoscopy;  Laterality: N/A;   ESOPHAGEAL MANOMETRY N/A 11/03/2018   Procedure: ESOPHAGEAL  MANOMETRY (EM);  Surgeon: Cedar Toribio SHAUNNA, MD;  Location: [facility];  Service: Endoscopy;  Laterality: N/A;   ESOPHAGOGASTRODUODENOSCOPY  02/10/2021   per Dr. Cedar, mod hiatal hernia, one ring was dilated   ESOPHAGOGASTRODUODENOSCOPY (EGD) WITH ESOPHAGEAL DILATION N/A 04/13/2012   Procedure: ESOPHAGOGASTRODUODENOSCOPY (EGD) WITH ESOPHAGEAL DILATION;  Surgeon: Toribio SHAUNNA Cedar, MD;  Location: [facility];  Service: Endoscopy;  Laterality: N/A;  possible botox  injection   EXTRACORPOREAL SHOCK WAVE LITHOTRIPSY Left 08/14/2020   Procedure: LEFT EXTRACORPOREAL SHOCK WAVE LITHOTRIPSY (ESWL);  Surgeon: Rosalind Zachary NOVAK, MD;  Location: [facility];  Service: Urology;  Laterality: Left;   FINGER SURGERY     3rd finger trigger bilateral    GALLBLADDER SURGERY     LITHOTRIPSY     REVERSE SHOULDER ARTHROPLASTY Right 06/05/2021   Procedure: REVERSE SHOULDER ARTHROPLASTY;  Surgeon: Kay Kemps, MD;  Location: [facility];  Service: Orthopedics;  Laterality: Right;  with ISB   ROTATOR CUFF REPAIR Right 01/10/2012   per Dr. Elspeth Kay, also repair torn biceps tendon    SHOULDER SURGERY Right    STERIOD INJECTION Left 07/27/2013   Procedure: STEROID INJECTION;  Surgeon: Elspeth JONELLE Kay, MD;  Location: [facility] OR;  Service: Orthopedics;  Laterality: Left;   TONSILLECTOMY     TRIGGER FINGER RELEASE Right 07/27/2013   Procedure: RIGHT RING FINGER A-1 RELEASE;  Surgeon: Elspeth JONELLE Kay, MD;  Location: [facility];  Service: Orthopedics;  Laterality: Right;   URETERAL STENT  PLACEMENT  07/2010   left ureter, per Dr. Nieves, for a stone     Prior to Admission medications   Medication Sig Start Date End Date Taking? Authorizing Provider  acetaminophen  (TYLENOL ) 650 MG CR tablet Take 650-1,300 mg by mouth every 8 (eight) hours as needed for pain.   Yes [provider]  Ascorbic Acid  (VITAMIN C PO) Take by mouth.   Yes [provider]  Cholecalciferol  (VITAMIN D) 50 MCG (2000 UT)  tablet Take 2,000 Units by mouth daily.   Yes [provider]  ezetimibe  (ZETIA ) 10 MG tablet Take 1 tablet (10 mg total) by mouth daily. 03/01/23  Yes Johnny Garnette LABOR, MD  fenofibrate  (TRICOR ) 145 MG tablet Take 1 tablet (145 mg total) by mouth every evening. 03/01/23  Yes Johnny Garnette LABOR, MD  fluticasone  (FLONASE ) 50 MCG/ACT nasal spray Place 1 spray into both nostrils daily as needed for allergies or rhinitis.   Yes [provider]  folic acid  (FOLVITE ) 800 MCG tablet Take 800 mcg by mouth daily.   Yes [provider]  hydrocortisone  (ANUSOL -HC) 25 MG suppository Place 1 suppository (25 mg total) rectally at bedtime. 05/18/23  Yes Thaddus Mcdowell V, MD  losartan  (COZAAR ) 50 MG tablet Take 1 tablet (50 mg total) by mouth daily. 05/06/23  Yes Johnny Garnette LABOR, MD  Multiple Vitamin (MULTIVITAMIN WITH MINERALS) TABS tablet Take 1 tablet by mouth daily.   Yes [provider]  RABEprazole  (ACIPHEX ) 20 MG tablet Take 1 tablet (20 mg total) by mouth 2 (two) times daily. 07/07/23  Yes Nature Vogelsang V, MD  temazepam  (RESTORIL ) 30 MG capsule Take 1 capsule (30 mg total) by mouth at bedtime as needed for sleep. 03/01/23  Yes Johnny Garnette LABOR, MD  zinc  gluconate 50 MG tablet Take 100 mg by mouth daily.   Yes [provider]  amoxicillin -clavulanate (AUGMENTIN ) 875-125 MG tablet Take 1 tablet by mouth 2 (two) times daily. Patient not taking: Reported on 07/05/2023 05/16/23   Justn Quale V, MD  clobetasol  cream (TEMOVATE ) 0.05 % Apply 1 application. topically 2 (two) times daily as needed for itching. 03/05/21   [provider]  loratadine  (CLARITIN ) 10 MG tablet Take 10 mg by mouth daily as needed for allergies.    [provider]  magic mouthwash (nystatin , hydrocortisone , diphenhydrAMINE ) suspension Swish and spit 5 mLs 4 (four) times daily as needed for mouth pain. 11/26/22   Johnny Garnette LABOR, MD  methocarbamol  (ROBAXIN ) 500 MG tablet Take 1 tablet (500  mg total) by mouth every 6 (six) hours as needed for muscle spasms. 06/05/21   Kay Kemps, MD  rivaroxaban  (XARELTO ) 20 MG TABS tablet Take 1 tablet (20 mg total) by mouth daily with supper. 07/09/22   Johnny Garnette LABOR, MD    Current Outpatient Medications  Medication Sig Dispense Refill   acetaminophen  (TYLENOL ) 650 MG CR tablet Take 650-1,300 mg by mouth every 8 (eight) hours as needed for pain.     Ascorbic Acid  (VITAMIN C PO) Take by mouth.     Cholecalciferol  (VITAMIN D) 50 MCG (2000 UT) tablet Take 2,000 Units by mouth daily.     ezetimibe  (ZETIA ) 10 MG tablet Take 1 tablet (10 mg total) by mouth daily. 90 tablet 3   fenofibrate  (TRICOR ) 145 MG tablet Take 1 tablet (145 mg total) by mouth every evening. 90 tablet 3   fluticasone  (FLONASE ) 50 MCG/ACT nasal spray Place 1 spray into both nostrils daily as needed for allergies or rhinitis.  folic acid  (FOLVITE ) 800 MCG tablet Take 800 mcg by mouth daily.     hydrocortisone  (ANUSOL -HC) 25 MG suppository Place 1 suppository (25 mg total) rectally at bedtime. 12 suppository 1   losartan  (COZAAR ) 50 MG tablet Take 1 tablet (50 mg total) by mouth daily. 90 tablet 3   Multiple Vitamin (MULTIVITAMIN WITH MINERALS) TABS tablet Take 1 tablet by mouth daily.     RABEprazole  (ACIPHEX ) 20 MG tablet Take 1 tablet (20 mg total) by mouth 2 (two) times daily. 180 tablet 3   temazepam  (RESTORIL ) 30 MG capsule Take 1 capsule (30 mg total) by mouth at bedtime as needed for sleep. 90 capsule 1   zinc  gluconate 50 MG tablet Take 100 mg by mouth daily.     amoxicillin -clavulanate (AUGMENTIN ) 875-125 MG tablet Take 1 tablet by mouth 2 (two) times daily. (Patient not taking: Reported on 07/05/2023) 20 tablet 0   clobetasol  cream (TEMOVATE ) 0.05 % Apply 1 application. topically 2 (two) times daily as needed for itching.     loratadine  (CLARITIN ) 10 MG tablet Take 10 mg by mouth daily as needed for allergies.     magic mouthwash (nystatin , hydrocortisone ,  diphenhydrAMINE ) suspension Swish and spit 5 mLs 4 (four) times daily as needed for mouth pain. 540 mL 2   methocarbamol  (ROBAXIN ) 500 MG tablet Take 1 tablet (500 mg total) by mouth every 6 (six) hours as needed for muscle spasms. 40 tablet 1   rivaroxaban  (XARELTO ) 20 MG TABS tablet Take 1 tablet (20 mg total) by mouth daily with supper. 90 tablet 3   Current Facility-Administered Medications  Medication Dose Route Frequency Provider Last Rate Last Admin   0.9 %  sodium chloride  infusion  500 mL Intravenous Continuous Cledith Abdou V, MD        Allergies as of 08/25/2023 - Review Complete 08/25/2023  Allergen Reaction Noted   Morphine Other (See Comments)    Levofloxacin  Swelling 04/16/2016   Amitriptyline hcl Other (See Comments)    Amoxicillin -pot clavulanate Nausea Only 01/18/2007   Cyclobenzaprine  hcl Other (See Comments) 07/31/2008   Lisinopril -hydrochlorothiazide  Other (See Comments) 05/24/2012    Family History  Problem Relation Age of Onset   Colon cancer Father    Heart attack Father    Prostate cancer Brother    Heart disease Brother    Prostate cancer Brother    Heart disease Brother    Esophageal cancer Neg Hx    Rectal cancer Neg Hx    Stomach cancer Neg Hx     Social History   Socioeconomic History   Marital status: Married    Spouse name: Not on file   Number of children: Not on file   Years of education: Not on file   Highest education level: Not on file  Occupational History   Occupation: Retired  Tobacco Use   Smoking status: Former    Current packs/day: 0.00    Average packs/day: 1.5 packs/day for 20.0 years (30.0 ttl pk-yrs)    Types: Cigarettes    Start date: 03/03/1953    Quit date: 03/03/1973    Years since quitting: 50.5    Passive exposure: Past   Smokeless tobacco: Never   Tobacco comments:    discussed AAA; did have CT of abd/ pelvis   Vaping Use   Vaping status: Never Used  Substance and Sexual Activity   Alcohol use: No     Alcohol/week: 0.0 standard drinks of alcohol   Drug use: No   Sexual activity:  Not on file  Other Topics Concern   Not on file  Social History Narrative   Not on file   Social Drivers of Health   Financial Resource Strain: Low Risk  (07/31/2021)   Overall Financial Resource Strain (CARDIA)    Difficulty of Paying Living Expenses: Not hard at all  Food Insecurity: No Food Insecurity (07/31/2021)   Hunger Vital Sign    Worried About Running Out of Food in the Last Year: Never true    Ran Out of Food in the Last Year: Never true  Transportation Needs: No Transportation Needs (07/31/2021)   PRAPARE - Administrator, Civil Service (Medical): No    Lack of Transportation (Non-Medical): No  Physical Activity: Insufficiently Active (07/31/2021)   Exercise Vital Sign    Days of Exercise per Week: 2 days    Minutes of Exercise per Session: 40 min  Stress: No Stress Concern Present (07/31/2021)   [employer] of Occupational Health - Occupational Stress Questionnaire    Feeling of Stress : Not at all  Social Connections: Socially Integrated (07/31/2021)   Social Connection and Isolation Panel    Frequency of Communication with Friends and Family: More than three times a week    Frequency of Social Gatherings with Friends and Family: More than three times a week    Attends Religious Services: More than 4 times per year    Active Member of [employer] or Organizations: Not on file    Attends Banker Meetings: More than 4 times per year    Marital Status: Married  Catering manager Violence: Not At Risk (07/31/2021)   Humiliation, Afraid, Rape, and Kick questionnaire    Fear of Current or Ex-Partner: No    Emotionally Abused: No    Physically Abused: No    Sexually Abused: No    Review of Systems:  All other review of systems negative except as mentioned in the HPI.  Physical Exam: Vital signs in last 24 hours: BP 119/69   Pulse 73   Temp 98.6 F (37 C)   Ht 5'  9 (1.753 m)   Wt 190 lb (86.2 kg)   SpO2 97%   BMI 28.06 kg/m  General:   Alert, NAD Lungs:  Clear .   Heart:  Regular rate and rhythm Abdomen:  Soft, nontender and nondistended. Neuro/Psych:  Alert and cooperative. Normal mood and affect. A and O x 3  Reviewed labs, radiology imaging, old records and pertinent past GI work up  Patient is appropriate for planned procedure(s) and anesthesia in an ambulatory setting   K. Veena Weda Baumgarner , MD [phone]

## 2023-08-25 NOTE — Progress Notes (Signed)
 Sedate, gd SR, tolerated procedure well, VSS, report to RN

## 2023-08-26 ENCOUNTER — Telehealth: Payer: Self-pay

## 2023-08-26 NOTE — Telephone Encounter (Signed)
  Follow up Call-     08/25/2023    7:56 AM 04/04/2023    9:27 AM 02/10/2021    9:23 AM  Call back number  Post procedure Call Back phone  # [phone]  Permission to leave phone message Yes Yes Yes     Patient questions:  Do you have a fever, pain , or abdominal swelling? No. Pain Score  0 *  Have you tolerated food without any problems? Yes.    Have you been able to return to your normal activities? Yes.    Do you have any questions about your discharge instructions: Diet   No. Medications  No. Follow up visit  No.  Do you have questions or concerns about your Care? No.  Actions: * If pain score is 4 or above: No action needed, pain <4.

## 2023-08-29 LAB — SURGICAL PATHOLOGY

## 2023-08-31 ENCOUNTER — Encounter: Payer: Self-pay | Admitting: Gastroenterology

## 2023-08-31 ENCOUNTER — Ambulatory Visit (INDEPENDENT_AMBULATORY_CARE_PROVIDER_SITE_OTHER): Admitting: Gastroenterology

## 2023-08-31 VITALS — BP 120/72 | HR 68 | Ht 69.5 in | Wt 191.1 lb

## 2023-08-31 DIAGNOSIS — K641 Second degree hemorrhoids: Secondary | ICD-10-CM | POA: Diagnosis not present

## 2023-08-31 NOTE — Patient Instructions (Signed)

## 2023-08-31 NOTE — Progress Notes (Signed)
 PROCEDURE NOTE: The patient presents with symptomatic grade II  hemorrhoids, requesting rubber band ligation of his/her hemorrhoidal disease.  All risks, benefits and alternative forms of therapy were described and informed consent was obtained.  In the Left Lateral Decubitus position anoscopic examination revealed grade II hemorrhoids in the right posterior position(s).  The anorectum was pre-medicated with 0.125% nitroglycerin  and RectiCare The decision was made to band the right posterior internal hemorrhoid, and the [facility] O'Regan System was used to perform band ligation without complication.  Digital anorectal examination was then performed to assure proper positioning of the band, and to adjust the banded tissue as required.  The patient was discharged home without pain or other issues.  Dietary and behavioral recommendations were given and along with follow-up instructions.      The patient will return in 4 to 6 weeks for  follow-up and possible additional banding as required. No complications were encountered and the patient tolerated the procedure well.   LOIS Wilkie Mcgee , MD [phone]

## 2023-09-20 DIAGNOSIS — K5730 Diverticulosis of large intestine without perforation or abscess without bleeding: Secondary | ICD-10-CM | POA: Diagnosis not present

## 2023-09-20 DIAGNOSIS — R109 Unspecified abdominal pain: Secondary | ICD-10-CM | POA: Diagnosis not present

## 2023-09-20 DIAGNOSIS — R6883 Chills (without fever): Secondary | ICD-10-CM | POA: Diagnosis not present

## 2023-09-20 DIAGNOSIS — I10 Essential (primary) hypertension: Secondary | ICD-10-CM | POA: Diagnosis not present

## 2023-09-20 DIAGNOSIS — I2510 Atherosclerotic heart disease of native coronary artery without angina pectoris: Secondary | ICD-10-CM | POA: Diagnosis not present

## 2023-09-20 DIAGNOSIS — R197 Diarrhea, unspecified: Secondary | ICD-10-CM | POA: Diagnosis not present

## 2023-09-20 DIAGNOSIS — Z7901 Long term (current) use of anticoagulants: Secondary | ICD-10-CM | POA: Diagnosis not present

## 2023-09-20 DIAGNOSIS — K529 Noninfective gastroenteritis and colitis, unspecified: Secondary | ICD-10-CM | POA: Diagnosis not present

## 2023-09-20 DIAGNOSIS — N179 Acute kidney failure, unspecified: Secondary | ICD-10-CM | POA: Diagnosis not present

## 2023-09-20 DIAGNOSIS — E785 Hyperlipidemia, unspecified: Secondary | ICD-10-CM | POA: Diagnosis not present

## 2023-10-10 ENCOUNTER — Encounter: Payer: Self-pay | Admitting: Family Medicine

## 2023-10-10 ENCOUNTER — Ambulatory Visit (INDEPENDENT_AMBULATORY_CARE_PROVIDER_SITE_OTHER): Admitting: Family Medicine

## 2023-10-10 VITALS — BP 110/76 | HR 79 | Temp 98.6°F | Wt 186.0 lb

## 2023-10-10 DIAGNOSIS — R5383 Other fatigue: Secondary | ICD-10-CM

## 2023-10-10 DIAGNOSIS — N529 Male erectile dysfunction, unspecified: Secondary | ICD-10-CM | POA: Diagnosis not present

## 2023-10-10 DIAGNOSIS — E538 Deficiency of other specified B group vitamins: Secondary | ICD-10-CM

## 2023-10-10 DIAGNOSIS — K529 Noninfective gastroenteritis and colitis, unspecified: Secondary | ICD-10-CM | POA: Diagnosis not present

## 2023-10-10 LAB — HEPATIC FUNCTION PANEL
ALT: 27 U/L (ref 0–53)
AST: 25 U/L (ref 0–37)
Albumin: 4.7 g/dL (ref 3.5–5.2)
Alkaline Phosphatase: 47 U/L (ref 39–117)
Bilirubin, Direct: 0.1 mg/dL (ref 0.0–0.3)
Total Bilirubin: 0.6 mg/dL (ref 0.2–1.2)
Total Protein: 7.5 g/dL (ref 6.0–8.3)

## 2023-10-10 NOTE — Progress Notes (Signed)
   Subjective:    Patient ID: Steven [name], male    DOB: May 10, 1950, 74 y.o.   MRN: [id number]  HPI Here to follow up an ED visit on 09-20-23 while he was in [facility]. He had the sudden onset of fever, body aches, generalized abdominal pain and diarrhea. No cough or ST. No nausea or vomiting. At the ED his WBC was normal at 8.6. his exam was remarkable only for generalized abdominal tenderness. A chest CTA was clear. An abd-pelvic CT showed colitis. He was treated with Augmentin , and now the abdominal pain and diarrhea has completely resolved. He is eating regularly. However he complains of generalized fatigue since all this happened, and he is concerned that something else is going on.    Review of Systems  Constitutional:  Positive for fatigue. Negative for fever.  Respiratory: Negative.    Cardiovascular: Negative.   Gastrointestinal: Negative.   Genitourinary: Negative.        Objective:   Physical Exam Constitutional:      Appearance: Normal appearance. He is not ill-appearing.  Cardiovascular:     Rate and Rhythm: Normal rate.     Pulses: Normal pulses.     Heart sounds: Normal heart sounds.  Pulmonary:     Effort: Pulmonary effort is normal.     Breath sounds: Normal breath sounds.  Abdominal:     General: Abdomen is flat. Bowel sounds are normal.     Palpations: Abdomen is soft.     Tenderness: There is no right CVA tenderness, left CVA tenderness, guarding or rebound.     Comments: Mild generalized tenderness   Neurological:     Mental Status: He is alert.           Assessment & Plan:  He is recovering from a colitis of some sort. His GI symptoms have resolved but he is still fatigued. I think this is to be expected for the next week or two, but we will check some labs today to make sure. I personally spent a total of in the care of the patient today including getting/reviewing separately obtained history, performing a medically appropriate  exam/evaluation, independently interpreting results, and communicating results.   Garnette Olmsted, MD

## 2023-10-11 ENCOUNTER — Ambulatory Visit: Payer: Self-pay | Admitting: Family Medicine

## 2023-10-11 LAB — VITAMIN B12: Vitamin B-12: 754 pg/mL (ref 211–911)

## 2023-10-11 LAB — TSH: TSH: 2.32 u[IU]/mL (ref 0.35–5.50)

## 2023-10-11 LAB — TESTOSTERONE: Testosterone: 291.38 ng/dL — ABNORMAL LOW (ref 300.00–890.00)

## 2023-10-13 ENCOUNTER — Telehealth: Payer: Self-pay

## 2023-10-13 NOTE — Telephone Encounter (Signed)
 Copied from CRM #[id number]. Topic: Clinical - Lab/Test Results >> Oct 11, 2023 12:07 PM Larissa S wrote: Reason for CRM: Att: Inocente- Patient's wife returning missed phone call regarding lab results. She states that he will take the injections and is open to start today. She is requesting a callback.

## 2023-10-14 ENCOUNTER — Telehealth: Payer: Self-pay

## 2023-10-14 ENCOUNTER — Other Ambulatory Visit (HOSPITAL_COMMUNITY): Payer: Self-pay

## 2023-10-14 ENCOUNTER — Telehealth (HOSPITAL_COMMUNITY): Payer: Self-pay | Admitting: Pharmacy Technician

## 2023-10-14 ENCOUNTER — Encounter (HOSPITAL_COMMUNITY): Payer: Self-pay

## 2023-10-14 MED ORDER — SYRINGE/NEEDLE (DISP) 25G X 1-1/2" 3 ML MISC
1.0000 | 0 refills | Status: AC
  Filled 2023-10-14: qty 7, 98d supply, fill #0

## 2023-10-14 MED ORDER — TESTOSTERONE CYPIONATE 200 MG/ML IM SOLN
200.0000 mg | INTRAMUSCULAR | 2 refills | Status: AC
  Filled 2023-10-14: qty 7, 98d supply, fill #0
  Filled 2023-10-14: qty 10, 140d supply, fill #0
  Filled 2023-10-17: qty 6, 84d supply, fill #0

## 2023-10-14 MED ORDER — SAFETY SYRINGES/NEEDLE 20G X 1-1/2" 3 ML MISC
1.0000 | 0 refills | Status: DC
  Filled 2023-10-14: qty 50, fill #0

## 2023-10-14 NOTE — Telephone Encounter (Signed)
 PA request has been Submitted. New Encounter has been or will be created for follow up. For additional info see Pharmacy Prior Auth telephone encounter from 10/14/23.

## 2023-10-14 NOTE — Addendum Note (Signed)
 Addended by: JOHNNY SENIOR A on: 10/14/2023 07:55 AM   Modules accepted: Orders

## 2023-10-14 NOTE — Telephone Encounter (Signed)
 Spoke with pt spouse stated to request pharmacy to hold off mailing Rx until pt is seen by Dr Johnny on 10/17/23

## 2023-10-14 NOTE — Telephone Encounter (Signed)
 PA request has been Received. New Encounter has been or will be created for follow up. For additional info see Pharmacy Prior Auth telephone encounter from 10/14/23.

## 2023-10-14 NOTE — Telephone Encounter (Signed)
 Pharmacy Patient Advocate Encounter   Received notification from Pt Calls Messages that prior authorization for Testosterone Cypionate 200MG /ML intramuscular solution is required/requested.   Insurance verification completed.   The patient is insured through [facility] ADVANTAGE/RX ADVANCE.   Per test claim: PA required; PA submitted to above mentioned insurance via Latent Key/confirmation #/EOC [id number] Status is pending

## 2023-10-14 NOTE — Telephone Encounter (Signed)
 Done

## 2023-10-17 ENCOUNTER — Encounter: Payer: Self-pay | Admitting: Family Medicine

## 2023-10-17 ENCOUNTER — Ambulatory Visit (INDEPENDENT_AMBULATORY_CARE_PROVIDER_SITE_OTHER): Admitting: Family Medicine

## 2023-10-17 ENCOUNTER — Other Ambulatory Visit (HOSPITAL_COMMUNITY): Payer: Self-pay

## 2023-10-17 VITALS — BP 120/62 | HR 72 | Temp 98.3°F | Wt 187.0 lb

## 2023-10-17 DIAGNOSIS — E291 Testicular hypofunction: Secondary | ICD-10-CM

## 2023-10-17 NOTE — Progress Notes (Signed)
   Subjective:    Patient ID: Steven [name], male    DOB: 1950-02-13, 73 y.o.   MRN: [id number]  HPI Here with his wife to discuss the pros and cons of testosterone replacement. He recently talked to us  about some fatigue and general lack of energy. We took labs that revealed a slightly low testosterone level at 291. We have obtained insurance approval for him to start injections, but they have questions about this. He is concerned about a slightly increased risk of blood clots, and he asks if  he is too old to start this.    Review of Systems  Constitutional:  Positive for fatigue.  Respiratory: Negative.    Cardiovascular: Negative.        Objective:   Physical Exam Constitutional:      Appearance: Normal appearance.  Cardiovascular:     Rate and Rhythm: Normal rate and regular rhythm.     Pulses: Normal pulses.     Heart sounds: Normal heart sounds.  Pulmonary:     Effort: Pulmonary effort is normal.     Breath sounds: Normal breath sounds.  Neurological:     Mental Status: He is alert.           Assessment & Plan:  Testosterone deficiency. We went over possible side effects of this, including the risk of blood clots. Since he will be taking anticoagulants the rest of his life, I think this risk is quite negligible for him. We also discussed a slightly higher risk of heart disease. After our discussion, they said they would go home and talk about it. They will let us  know what they decide to do.  Garnette Olmsted, MD

## 2023-10-17 NOTE — Telephone Encounter (Signed)
 Pharmacy Patient Advocate Encounter  Received notification from [facility] [employer] that Prior Authorization for Testosterone Cypionate 200MG /ML intramuscular solution  has been APPROVED from 10/14/23 to 04/11/24. Ran test claim, Copay is $0. This test claim was processed through [facility]- copay amounts may vary at other pharmacies due to pharmacy/plan contracts, or as the patient moves through the different stages of their insurance plan.   PA #/Case ID/Reference #: [id number]

## 2023-10-18 ENCOUNTER — Other Ambulatory Visit (HOSPITAL_COMMUNITY): Payer: Self-pay

## 2023-11-09 ENCOUNTER — Ambulatory Visit: Payer: Self-pay | Admitting: Gastroenterology

## 2023-11-14 ENCOUNTER — Ambulatory Visit: Admitting: Gastroenterology

## 2023-11-25 ENCOUNTER — Ambulatory Visit: Payer: Self-pay

## 2023-11-25 ENCOUNTER — Other Ambulatory Visit (HOSPITAL_BASED_OUTPATIENT_CLINIC_OR_DEPARTMENT_OTHER): Payer: Self-pay

## 2023-11-25 ENCOUNTER — Other Ambulatory Visit: Payer: Self-pay

## 2023-11-25 ENCOUNTER — Encounter (HOSPITAL_BASED_OUTPATIENT_CLINIC_OR_DEPARTMENT_OTHER): Payer: Self-pay

## 2023-11-25 ENCOUNTER — Emergency Department (HOSPITAL_BASED_OUTPATIENT_CLINIC_OR_DEPARTMENT_OTHER): Admitting: Radiology

## 2023-11-25 ENCOUNTER — Emergency Department (HOSPITAL_BASED_OUTPATIENT_CLINIC_OR_DEPARTMENT_OTHER)
Admission: EM | Admit: 2023-11-25 | Discharge: 2023-11-25 | Disposition: A | Discharge status: Home / Self Care | Source: Ambulatory Visit | Attending: Emergency Medicine | Admitting: Emergency Medicine

## 2023-11-25 DIAGNOSIS — R7989 Other specified abnormal findings of blood chemistry: Secondary | ICD-10-CM | POA: Diagnosis not present

## 2023-11-25 DIAGNOSIS — Z79899 Other long term (current) drug therapy: Secondary | ICD-10-CM | POA: Insufficient documentation

## 2023-11-25 DIAGNOSIS — I10 Essential (primary) hypertension: Secondary | ICD-10-CM | POA: Diagnosis not present

## 2023-11-25 DIAGNOSIS — Z7901 Long term (current) use of anticoagulants: Secondary | ICD-10-CM | POA: Diagnosis not present

## 2023-11-25 DIAGNOSIS — X500XXA Overexertion from strenuous movement or load, initial encounter: Secondary | ICD-10-CM | POA: Insufficient documentation

## 2023-11-25 DIAGNOSIS — Z86711 Personal history of pulmonary embolism: Secondary | ICD-10-CM | POA: Insufficient documentation

## 2023-11-25 DIAGNOSIS — R079 Chest pain, unspecified: Secondary | ICD-10-CM | POA: Diagnosis not present

## 2023-11-25 DIAGNOSIS — S29011A Strain of muscle and tendon of front wall of thorax, initial encounter: Secondary | ICD-10-CM | POA: Insufficient documentation

## 2023-11-25 DIAGNOSIS — Z471 Aftercare following joint replacement surgery: Secondary | ICD-10-CM | POA: Diagnosis not present

## 2023-11-25 DIAGNOSIS — Z96611 Presence of right artificial shoulder joint: Secondary | ICD-10-CM | POA: Diagnosis not present

## 2023-11-25 LAB — BASIC METABOLIC PANEL WITH GFR
Anion gap: 9 (ref 5–15)
BUN: 20 mg/dL (ref 8–23)
CO2: 26 mmol/L (ref 22–32)
Calcium: 10.4 mg/dL — ABNORMAL HIGH (ref 8.9–10.3)
Chloride: 104 mmol/L (ref 98–111)
Creatinine, Ser: 1.28 mg/dL — ABNORMAL HIGH (ref 0.61–1.24)
GFR, Estimated: 58 mL/min — ABNORMAL LOW (ref >60)
Glucose, Bld: 110 mg/dL — ABNORMAL HIGH (ref 70–99)
Potassium: 4.1 mmol/L (ref 3.5–5.1)
Sodium: 140 mmol/L (ref 135–145)

## 2023-11-25 LAB — CBC
HCT: 40 % (ref 39.0–52.0)
Hemoglobin: 13.8 g/dL (ref 13.0–17.0)
MCH: 30.3 pg (ref 26.0–34.0)
MCHC: 34.5 g/dL (ref 30.0–36.0)
MCV: 87.9 fL (ref 80.0–100.0)
Platelets: 232 K/uL (ref 150–400)
RBC: 4.55 MIL/uL (ref 4.22–5.81)
RDW: 13.5 % (ref 11.5–15.5)
WBC: 4.3 K/uL (ref 4.0–10.5)
nRBC: 0 % (ref 0.0–0.2)

## 2023-11-25 LAB — TROPONIN T, HIGH SENSITIVITY: Troponin T High Sensitivity: <15 ng/L (ref 0–19)

## 2023-11-25 MED ORDER — LIDOCAINE 5 % EX PTCH
1.0000 | MEDICATED_PATCH | Freq: Once | CUTANEOUS | Status: DC
  Administered 2023-11-25: 1 via TRANSDERMAL
  Filled 2023-11-25: qty 1

## 2023-11-25 MED ORDER — LIDOCAINE 5 % EX PTCH
1.0000 | MEDICATED_PATCH | CUTANEOUS | 0 refills | Status: AC
  Filled 2023-11-25: qty 30, 30d supply, fill #0

## 2023-11-25 NOTE — ED Triage Notes (Addendum)
 Pt reports 'I think I pulled something in my chest picking up a box yesterday'. Reports was on his knees, picking up a box and heard a 'pop and felt like 2 bones rubbing together'. L sided CP, tender to palpation. Pain worse w/ movement and deep inspiration

## 2023-11-25 NOTE — Telephone Encounter (Signed)
 We are seeing him later today

## 2023-11-25 NOTE — ED Notes (Signed)
 Pt verbalized understanding of dc instructions.

## 2023-11-25 NOTE — Discharge Instructions (Addendum)
 The pain in your chest seems to be due to a pulled muscle.  This will heal with time.  It should be fully healed within the next 3 to 4 weeks.  Your workup today is reassuring. It is very unlikely that your pain is due to an issue in your heart.  Your cardiac enzyme (troponin) was normal today. Your EKG which is a measure of the heart's electrical activity and rhythm is normal today. These would both show abnormalities if you were having a heart attack.  Your chest x-ray is normal today.  No signs of a rib fracture, fluid in the lungs, or any other abnormality.  Your blood counts are normal today.    Your kidney function, creatinine, was slightly abnormal today.  This indicates your kidneys are not filtering as well as they should be.  This could be due to some dehydration.  Please keep well-hydrated at home.  Please have this lab value repeated by your PCP within the next 3 months to further monitor your kidney function.  You may take up to 1000mg  of tylenol  every 6 hours as needed for pain.  Do not take more then 4g per day.  You have been prescribed lidocaine  patches to use for pain.  You may apply 1 patch to the chest wall for up to 12 hours at a time.  You must then fully remove the patch for 12 hours before reapplying a new patch.  Return to the ER if you have any shortness of breath, difficulty breathing, worsening chest pain, dizziness, any other new or concerning symptoms

## 2023-11-25 NOTE — Telephone Encounter (Signed)
 Triaged to ED.  FYI Only or Action Required?: FYI only for provider: ED advised.  Patient was last seen in primary care on 10/17/2023 by Johnny Garnette LABOR, MD.  Called Nurse Triage reporting Chest Pain.  Symptoms began yesterday.  Interventions attempted: Rest, hydration, or home remedies.  Symptoms are: gradually worsening.  Triage Disposition: Go to ED Now (Notify PCP)  Patient/caregiver understands and will follow disposition?: Yes Reason for Disposition  History of prior blood clot in leg or lungs (i.e., deep vein thrombosis, pulmonary embolism)  Answer Assessment - Initial Assessment Questions Reports that he has been having mid/sternal sharp chest pain and back pain since lifting a heavy object yesterdayafternoon, worse when taking a deep breath and with movement. Denies trauma. Pain occurs with movement. Denies diaphoresis.  1. LOCATION: Where does it hurt?       Mid chest/sternum 2. RADIATION: Does the pain go anywhere else? (e.g., into neck, jaw, arms, back)     Pain radiates to back 3. ONSET: When did the chest pain begin? (Minutes, hours or days)      Yesterday afternoon 4. PATTERN: Does the pain come and go, or has it been constant since it started?  Does it get worse with exertion?      Changes with position 5. DURATION: How long does it last (e.g., seconds, minutes, hours)     Depends on activity 6. SEVERITY: How bad is the pain?  (e.g., Scale 1-10; mild, moderate, or severe)     8/10 7. CARDIAC RISK FACTORS: Do you have any history of heart problems or risk factors for heart disease? (e.g., angina, prior heart attack; diabetes, high blood pressure, high cholesterol, smoker, or strong family history of heart disease)     Hx of DVT and PE  Protocols used: Chest Pain-A-AH Copied from CRM #[id number]. Topic: Clinical - Red Word Triage >> Nov 25, 2023  8:03 AM Pinkey ORN wrote: Red Word that prompted transfer to Nurse Triage: Steven Pain In Breast  [name]  Past Medical History:  Diagnosis Date   Arthritis    Clotting disorder    Displacement of intervertebral disc, site unspecified, without myelopathy    Esophageal reflux    Headache(784.0)    sinus and migraines   Herpes simplex without mention of complication    Hiatal hernia    HTN (hypertension) 05/10/2012   Hyperlipemia    Hypertrophy (benign) of prostate    Intestinal infection due to other organism, not elsewhere classified    Kidney stones    sees Dr. Nieves   Personal history of colonic polyps    Personal history of urinary calculi    Personal history of venous thrombosis and embolism    Renal insufficiency

## 2023-11-25 NOTE — ED Provider Notes (Signed)
 [place] EMERGENCY DEPARTMENT AT [facility] Provider Note   CSN: [id number] Arrival date & time: 11/25/23  9053     Patient presents with: Chest Pain   Steven [name] is a 73 y.o. male with history of hypertension, PE on Xarelto , presents with concern for a possible pulled muscle in his chest.  He states he was lifting a heavy box yesterday when he felt something strain in his chest and began to have some pain in the center of his chest.  Reports pain is worse with movement.  Denies any pain with exertion or any shortness of breath.  Denies any pain radiating to the back.    Chest Pain      Prior to Admission medications   Medication Sig Start Date End Date Taking? Authorizing Provider  lidocaine  (LIDODERM ) 5 % Apply 1 patch to the chest wall for up to 12 hours at a time.  You must then remove the patch for a full 12 hours before reapplying a new patch. 11/25/23  Yes Veta Palma, PA-C  acetaminophen  (TYLENOL ) 650 MG CR tablet Take 650-1,300 mg by mouth every 8 (eight) hours as needed for pain.    [provider]  Ascorbic Acid  (VITAMIN C PO) Take by mouth.    [provider]  Cholecalciferol  (VITAMIN D) 50 MCG (2000 UT) tablet Take 2,000 Units by mouth daily.    [provider]  clobetasol  cream (TEMOVATE ) 0.05 % Apply 1 application. topically 2 (two) times daily as needed for itching. 03/05/21   [provider]  dicyclomine  (BENTYL ) 10 MG capsule Take 1 capsule (10 mg total) by mouth daily as needed for spasms. 08/25/23   Nandigam, Kavitha V, MD  ezetimibe  (ZETIA ) 10 MG tablet Take 1 tablet (10 mg total) by mouth daily. 03/01/23   Johnny Garnette LABOR, MD  fenofibrate  (TRICOR ) 145 MG tablet Take 1 tablet (145 mg total) by mouth every evening. 03/01/23   Johnny Garnette LABOR, MD  fluticasone  (FLONASE ) 50 MCG/ACT nasal spray Place 1 spray into both nostrils daily as needed for allergies or rhinitis.    [provider]  folic acid  (FOLVITE )  800 MCG tablet Take 800 mcg by mouth daily.    [provider]  hydrocortisone  (ANUSOL -HC) 25 MG suppository Place 1 suppository (25 mg total) rectally at bedtime. 05/18/23   Nandigam, Kavitha V, MD  loratadine  (CLARITIN ) 10 MG tablet Take 10 mg by mouth daily as needed for allergies.    [provider]  losartan  (COZAAR ) 50 MG tablet Take 1 tablet (50 mg total) by mouth daily. 05/06/23   Johnny Garnette LABOR, MD  magic mouthwash (nystatin , hydrocortisone , diphenhydrAMINE ) suspension Swish and spit 5 mLs 4 (four) times daily as needed for mouth pain. 11/26/22   Johnny Garnette LABOR, MD  methocarbamol  (ROBAXIN ) 500 MG tablet Take 1 tablet (500 mg total) by mouth every 6 (six) hours as needed for muscle spasms. 06/05/21   Kay Kemps, MD  Multiple Vitamin (MULTIVITAMIN WITH MINERALS) TABS tablet Take 1 tablet by mouth daily.    [provider]  RABEprazole  (ACIPHEX ) 20 MG tablet Take 1 tablet (20 mg total) by mouth 2 (two) times daily. 07/07/23   Nandigam, Kavitha V, MD  rivaroxaban  (XARELTO ) 20 MG TABS tablet Take 1 tablet (20 mg total) by mouth daily with supper. 07/09/22   Johnny Garnette LABOR, MD  SYRINGE-NEEDLE, DISP, 3 ML 25G X 1-1/2 3 ML MISC use as directed every 14 (fourteen) days. 10/14/23   Johnny Garnette  A, MD  temazepam  (RESTORIL ) 30 MG capsule Take 1 capsule (30 mg total) by mouth at bedtime as needed for sleep. 03/01/23   Johnny Garnette LABOR, MD  testosterone cypionate (DEPOTESTOSTERONE CYPIONATE) 200 MG/ML injection Inject 1 mL (200 mg total) into the muscle every 14 (fourteen) days. 10/14/23   Johnny Garnette LABOR, MD  zinc  gluconate 50 MG tablet Take 100 mg by mouth daily.    [provider]    Allergies: Morphine, Levofloxacin , Amitriptyline hcl, Amoxicillin -pot clavulanate, Cyclobenzaprine  hcl, and Lisinopril -hydrochlorothiazide     Review of Systems  Cardiovascular:  Positive for chest pain.    Updated Vital Signs BP (!) 146/81   Pulse (!) 59   Temp 98.4 F (36.9 C) (Oral)    Resp 20   Ht 5' 9.5 (1.765 m)   Wt 83 kg   SpO2 97%   BMI 26.64 kg/m   Physical Exam Vitals and nursing note reviewed.  Constitutional:      General: He is not in acute distress.    Appearance: He is well-developed.  HENT:     Head: Normocephalic and atraumatic.  Eyes:     Conjunctiva/sclera: Conjunctivae normal.  Cardiovascular:     Rate and Rhythm: Normal rate and regular rhythm.     Heart sounds: No murmur heard.    Comments: 2+ radial pulse bilaterally Pulmonary:     Effort: Pulmonary effort is normal. No respiratory distress.     Breath sounds: Normal breath sounds.  Chest:    Abdominal:     Palpations: Abdomen is soft.     Tenderness: There is no abdominal tenderness.  Musculoskeletal:        General: No swelling.     Cervical back: Neck supple.     Comments: Reproducible chest tenderness to palpation along the lower aspect of the sternum  Skin:    General: Skin is warm and dry.     Capillary Refill: Capillary refill takes less than 2 seconds.  Neurological:     Mental Status: He is alert.  Psychiatric:        Mood and Affect: Mood normal.     (all labs ordered are listed, but only abnormal results are displayed) Labs Reviewed  BASIC METABOLIC PANEL WITH GFR - Abnormal; Notable for the following components:      Result Value   Glucose, Bld 110 (*)    Creatinine, Ser 1.28 (*)    Calcium 10.4 (*)    GFR, Estimated 58 (*)    All other components within normal limits  CBC  TROPONIN T, HIGH SENSITIVITY    EKG: EKG Interpretation Date/Time:  Friday November 25 2023 09:54:01 EST Ventricular Rate:  69 PR Interval:  192 QRS Duration:  114 QT Interval:  391 QTC Calculation: 419 R Axis:   -26  Text Interpretation: Sinus rhythm Atrial premature complex Incomplete right bundle branch block Low voltage, precordial leads Left ventricular hypertrophy Confirmed by Steven [name] [phone]) on 11/25/2023 12:59:23 PM  Radiology: [facility] Chest 2 View Result Date:  11/25/2023 CLINICAL DATA:  Chest pain EXAM: CHEST - 2 VIEW COMPARISON:  06/19/2021 FINDINGS: The lungs are clear without focal pneumonia, edema, pneumothorax or pleural effusion. The cardiopericardial silhouette is within normal limits for size. No acute bony abnormality. Status post right shoulder replacement. Telemetry leads overlie the chest. IMPRESSION: No active cardiopulmonary disease. Electronically Signed   By: Camellia Candle M.D.   On: 11/25/2023 10:57     Procedures   Medications Ordered in the ED  lidocaine  (  LIDODERM ) 5 % 1 patch (has no administration in time range)                                    Medical Decision Making Amount and/or Complexity of Data Reviewed Labs: ordered. Radiology: ordered.  Risk Prescription drug management.     Differential diagnosis includes but is not limited to ACS, arrhythmia, aortic aneurysm, pericarditis, myocarditis, pericardial effusion, cardiac tamponade, musculoskeletal pain, GERD, Boerhaave's syndrome, DVT/PE, pneumonia, pleural effusion   ED Course:  Upon initial evaluation, patient is very well-appearing, stable vitals.  Reporting chest pain that started yesterday after lifting a heavy box.  He does have reproducible pain upon palpation of his lower sternum.  Pain is worse with movement.   Labs Ordered: I Ordered, and personally interpreted labs.  The pertinent results include:   Troponin within normal limits CBC within normal limits BMP with slightly elevated creatinine at 1.28, mildly elevated calcium of 10.4.  Imaging Studies ordered: I ordered imaging studies including chest x-ray I independently visualized the imaging with scope of interpretation limited to determining acute life threatening conditions related to emergency care. Imaging showed no acute abnormality I agree with the radiologist interpretation   Cardiac Monitoring: / EKG: The patient was maintained on a cardiac monitor.  I personally viewed and  interpreted the cardiac monitored which showed an underlying rhythm of: Normal sinus rhythm   Medications Given: Lidocaine  patch  Upon re-evaluation, patient remains well-appearing with stable vitals.   Low concern for ACS at this time given troponin within normal limits at less than 15, pain non-exertional, and EKG with normal sinus rhythm and no ST changes.  Chest x-ray without any acute abnormality.  Chest pain started after lifting a heavy object.  Pain is reproducible with palpation of the anterior chest wall and worse with movement.  I feel that patient's pain is musculoskeletal.  Very low concern for ACS, PE, dissection.  Patient stable and appropriate for discharge home Patient discussed with my attending Dr. Jakie who is in agreement with plan of care.    Impression: Muscle strain of anterior chest wall  Disposition:  The patient was discharged home with instructions to use lidocaine  patches and Tylenol  as needed for pain.  He will avoid NSAIDs due to him being on Xarelto .  Follow-up with PCP if symptoms not improving within the next 2 to 3 weeks.  Follow-up with PCP within the next 3 months to have creatinine rechecked. Return precautions given and patient verbalized understanding.    This chart was dictated using voice recognition software, Dragon. Despite the best efforts of this provider to proofread and correct errors, errors may still occur which can change documentation meaning.       Final diagnoses:  Muscle strain of anterior chest wall    ED Discharge Orders          Ordered    lidocaine  (LIDODERM ) 5 %  Every 24 hours        11/25/23 1307               Veta Palma, PA-C 11/25/23 1315    Steven Lamar BROCKS, MD 11/26/23 [phone]

## 2023-11-28 ENCOUNTER — Encounter: Payer: Self-pay | Admitting: Cardiovascular Disease

## 2023-11-28 ENCOUNTER — Other Ambulatory Visit (HOSPITAL_BASED_OUTPATIENT_CLINIC_OR_DEPARTMENT_OTHER): Payer: Self-pay

## 2023-11-28 ENCOUNTER — Ambulatory Visit: Attending: Cardiovascular Disease | Admitting: Cardiovascular Disease

## 2023-11-28 ENCOUNTER — Other Ambulatory Visit (HOSPITAL_COMMUNITY): Payer: Self-pay

## 2023-11-28 VITALS — BP 124/68 | HR 73 | Ht 69.0 in | Wt 187.0 lb

## 2023-11-28 DIAGNOSIS — R072 Precordial pain: Secondary | ICD-10-CM | POA: Diagnosis not present

## 2023-11-28 DIAGNOSIS — I82409 Acute embolism and thrombosis of unspecified deep veins of unspecified lower extremity: Secondary | ICD-10-CM | POA: Diagnosis not present

## 2023-11-28 DIAGNOSIS — I10 Essential (primary) hypertension: Secondary | ICD-10-CM

## 2023-11-28 DIAGNOSIS — R0789 Other chest pain: Secondary | ICD-10-CM

## 2023-11-28 DIAGNOSIS — E782 Mixed hyperlipidemia: Secondary | ICD-10-CM | POA: Diagnosis not present

## 2023-11-28 MED ORDER — METOPROLOL TARTRATE 50 MG PO TABS
ORAL_TABLET | ORAL | 0 refills | Status: AC
  Filled 2023-11-28 (×2): qty 1, 1d supply, fill #0

## 2023-11-28 NOTE — Assessment & Plan Note (Signed)
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

## 2023-11-28 NOTE — Assessment & Plan Note (Signed)
 History of atypical chest pain with a coronary CTA performed 02/05/2020 revealed a coronary calcium score 0 without any evidence of CAD.  He did have a 2D echo performed 02/14/2020 which was essentially normal as well.  He has been complaining of increasing frequency of atypical chest pain and profound fatigue.  I am going to repeat the coronary CTA and 2D echocardiogram.

## 2023-11-28 NOTE — Progress Notes (Signed)
 11/28/2023 [name]   03-23-1950  [id number]  Primary Physician Johnny Garnette LABOR, MD Primary Cardiologist: Dorn JINNY Lesches MD [facility] [place] [place], [place]  HPI:  Steven [name] is a 73 y.o.  mildly overweight married Caucasian male father of 3 children, grandfather 2 grandchildren who is accompanied by his wife Rojelio today. He was referred by his PCP, Dr. Johnny, for cardiovascular valuation because of chest pain. He is retired from being an insurance account manager at [employer].. His risk factors include treated hypertension, hyperlipidemia and strong family history for heart disease with father and 3 brothers all of whom had ischemic heart disease. He is never had a heart attack or stroke. He apparently had a normal cardiac cath many years ago and a negative Myoview stress test in 2015 during an admission for chest pains/rule out myocardial infarction. He gets chest pain several times a month that occur randomly without association with any particular activity.  I did a coronary CTA on him 02/05/2020 that revealed a Correy calcium score of 0 with clean coronary arteries and a 2D echo that was essentially normal as well.    Since I saw him 4 years ago he has noted increased frequency of atypical chest pain and profound fatigue.  His primary care provider has evaluated metabolic and hormonal causes as well without finding any significant causes.   Current Meds  Medication Sig   acetaminophen  (TYLENOL ) 650 MG CR tablet Take 650-1,300 mg by mouth every 8 (eight) hours as needed for pain.   Ascorbic Acid  (VITAMIN C PO) Take by mouth.   Cholecalciferol  (VITAMIN D) 50 MCG (2000 UT) tablet Take 2,000 Units by mouth daily.   clobetasol  cream (TEMOVATE ) 0.05 % Apply 1 application. topically 2 (two) times daily as needed for itching.   dicyclomine  (BENTYL ) 10 MG capsule Take 1 capsule (10 mg total) by mouth daily as needed for spasms.   ezetimibe  (ZETIA ) 10 MG tablet Take 1 tablet (10 mg total) by mouth  daily.   fenofibrate  (TRICOR ) 145 MG tablet Take 1 tablet (145 mg total) by mouth every evening.   fluticasone  (FLONASE ) 50 MCG/ACT nasal spray Place 1 spray into both nostrils daily as needed for allergies or rhinitis.   folic acid  (FOLVITE ) 800 MCG tablet Take 800 mcg by mouth daily.   hydrocortisone  (ANUSOL -HC) 25 MG suppository Place 1 suppository (25 mg total) rectally at bedtime.   lidocaine  (LIDODERM ) 5 % Apply 1 patch to the chest wall for up to 12 hours at a time.  You must then remove the patch for a full 12 hours before reapplying a new patch.   loratadine  (CLARITIN ) 10 MG tablet Take 10 mg by mouth daily as needed for allergies.   losartan  (COZAAR ) 50 MG tablet Take 1 tablet (50 mg total) by mouth daily.   magic mouthwash (nystatin , hydrocortisone , diphenhydrAMINE ) suspension Swish and spit 5 mLs 4 (four) times daily as needed for mouth pain.   methocarbamol  (ROBAXIN ) 500 MG tablet Take 1 tablet (500 mg total) by mouth every 6 (six) hours as needed for muscle spasms.   Multiple Vitamin (MULTIVITAMIN WITH MINERALS) TABS tablet Take 1 tablet by mouth daily.   RABEprazole  (ACIPHEX ) 20 MG tablet Take 1 tablet (20 mg total) by mouth 2 (two) times daily.   rivaroxaban  (XARELTO ) 20 MG TABS tablet Take 1 tablet (20 mg total) by mouth daily with supper.   SYRINGE-NEEDLE, DISP, 3 ML 25G X 1-1/2 3 ML MISC use as directed every 14 (  fourteen) days.   temazepam  (RESTORIL ) 30 MG capsule Take 1 capsule (30 mg total) by mouth at bedtime as needed for sleep.   testosterone cypionate (DEPOTESTOSTERONE CYPIONATE) 200 MG/ML injection Inject 1 mL (200 mg total) into the muscle every 14 (fourteen) days.   zinc  gluconate 50 MG tablet Take 100 mg by mouth daily.     Allergies  Allergen Reactions   Morphine Other (See Comments)    headache   Levofloxacin  Swelling   Amitriptyline Hcl Other (See Comments)    Mood changes   Amoxicillin -Pot Clavulanate Nausea Only   Cyclobenzaprine  Hcl Other (See Comments)     unknown   Lisinopril -Hydrochlorothiazide  Other (See Comments)    depression    Social History   Socioeconomic History   Marital status: Married    Spouse name: Not on file   Number of children: Not on file   Years of education: Not on file   Highest education level: Not on file  Occupational History   Occupation: Retired  Tobacco Use   Smoking status: Former    Current packs/day: 0.00    Average packs/day: 1.5 packs/day for 20.0 years (30.0 ttl pk-yrs)    Types: Cigarettes    Start date: 03/03/1953    Quit date: 03/03/1973    Years since quitting: 50.7    Passive exposure: Past   Smokeless tobacco: Never   Tobacco comments:    discussed AAA; did have CT of abd/ pelvis   Vaping Use   Vaping status: Never Used  Substance and Sexual Activity   Alcohol use: No    Alcohol/week: 0.0 standard drinks of alcohol   Drug use: No   Sexual activity: Not on file  Other Topics Concern   Not on file  Social History Narrative   Not on file   Social Drivers of Health   Financial Resource Strain: Low Risk  (07/31/2021)   Overall Financial Resource Strain (CARDIA)    Difficulty of Paying Living Expenses: Not hard at all  Food Insecurity: No Food Insecurity (07/31/2021)   Hunger Vital Sign    Worried About Running Out of Food in the Last Year: Never true    Ran Out of Food in the Last Year: Never true  Transportation Needs: No Transportation Needs (07/31/2021)   PRAPARE - Administrator, Civil Service (Medical): No    Lack of Transportation (Non-Medical): No  Physical Activity: Insufficiently Active (07/31/2021)   Exercise Vital Sign    Days of Exercise per Week: 2 days    Minutes of Exercise per Session: 40 min  Stress: No Stress Concern Present (07/31/2021)   [employer] of Occupational Health - Occupational Stress Questionnaire    Feeling of Stress : Not at all  Social Connections: Socially Integrated (07/31/2021)   Social Connection and Isolation Panel     Frequency of Communication with Friends and Family: More than three times a week    Frequency of Social Gatherings with Friends and Family: More than three times a week    Attends Religious Services: More than 4 times per year    Active Member of [employer] or Organizations: Not on file    Attends Banker Meetings: More than 4 times per year    Marital Status: Married  Catering Manager Violence: Not At Risk (07/31/2021)   Humiliation, Afraid, Rape, and Kick questionnaire    Fear of Current or Ex-Partner: No    Emotionally Abused: No    Physically Abused: No  Sexually Abused: No     Review of Systems: General: negative for chills, fever, night sweats or weight changes.  Cardiovascular: negative for chest pain, dyspnea on exertion, edema, orthopnea, palpitations, paroxysmal nocturnal dyspnea or shortness of breath Dermatological: negative for rash Respiratory: negative for cough or wheezing Urologic: negative for hematuria Abdominal: negative for nausea, vomiting, diarrhea, bright red blood per rectum, melena, or hematemesis Neurologic: negative for visual changes, syncope, or dizziness All other systems reviewed and are otherwise negative except as noted above.    Blood pressure 124/68, pulse 73, height 5' 9 (1.753 m), weight 187 lb (84.8 kg), SpO2 95%.  General appearance: alert and no distress Neck: no adenopathy, no carotid bruit, no JVD, supple, symmetrical, trachea midline, and thyroid  not enlarged, symmetric, no tenderness/mass/nodules Lungs: clear to auscultation bilaterally Heart: regular rate and rhythm, S1, S2 normal, no murmur, click, rub or gallop Extremities: extremities normal, atraumatic, no cyanosis or edema Pulses: 2+ and symmetric Skin: Skin color, texture, turgor normal. 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  HTN (hypertension) History of essential hypertension blood pressure measured today 124/68.  He is on losartan .  Atypical chest pain History of atypical chest pain with a coronary CTA performed 02/05/2020 revealed a coronary calcium score 0 without any evidence of CAD.  He did have a 2D echo performed 02/14/2020 which was essentially normal as well.  He has been complaining of increasing frequency of atypical chest pain and profound fatigue.  I am going to repeat the coronary CTA and 2D echocardiogram.     Dorn DOROTHA Lesches MD [facility], [facility] 11/28/2023 1:49 PM

## 2023-11-28 NOTE — Patient Instructions (Signed)
 Medication Instructions:  Your physician recommends that you continue on your current medications as directed. Please refer to the Current Medication list given to you today.  *If you need a refill on your cardiac medications before your next appointment, please call your pharmacy*  Testing/Procedures: Your physician has requested that you have an echocardiogram. Echocardiography is a painless test that uses sound waves to create images of your heart. It provides your doctor with information about the size and shape of your heart and how well your heart's chambers and valves are working. This procedure takes approximately one hour. There are no restrictions for this procedure. Please do NOT wear cologne, perfume, aftershave, or lotions (deodorant is allowed). Please arrive 15 minutes prior to your appointment time.  Please note: We ask at that you not bring children with you during ultrasound (echo/ vascular) testing. Due to room size and safety concerns, children are not allowed in the ultrasound rooms during exams. Our front office staff cannot provide observation of children in our lobby area while testing is being conducted. An adult accompanying a patient to their appointment will only be allowed in the ultrasound room at the discretion of the ultrasound technician under special circumstances. We apologize for any inconvenience.   Follow-Up: At [facility], you and your health needs are our priority.  As part of our continuing mission to provide you with exceptional heart care, our providers are all part of one team.  This team includes your primary Cardiologist (physician) and Advanced Practice Providers or APPs (Physician Assistants and Nurse Practitioners) who all work together to provide you with the care you need, when you need it.  Your next appointment:   12 month(s)  Provider:   Dorn Lesches, MD    We recommend signing up for the patient portal called MyChart.  Sign  up information is provided on this After Visit Summary.  MyChart is used to connect with patients for Virtual Visits (Telemedicine).  Patients are able to view lab/test results, encounter notes, upcoming appointments, etc.  Non-urgent messages can be sent to your provider as well.   To learn more about what you can do with MyChart, go to [email or web address].   Other Instructions   Your cardiac CT will be scheduled at the below location:   [facility] [address]  [place], [place] [address]   If scheduled at the Heart and [facility] at [address], please enter the parking lot using the [address] entrance and use the FREE valet service at the patient drop-off area. Enter the building and check-in with registration on the main floor.   Please follow these instructions carefully (unless otherwise directed):  An IV will be required for this test and Nitroglycerin  will be given.  Hold all erectile dysfunction medications at least 3 days (72 hrs) prior to test. (Ie viagra, cialis, sildenafil, tadalafil, etc)   On the Night Before the Test: Be sure to Drink plenty of water . Do not consume any caffeinated/decaffeinated beverages or chocolate 12 hours prior to your test. Do not take any antihistamines 12 hours prior to your test.  On the Day of the Test: Drink plenty of water  until 1 hour prior to the test. Do not eat any food 1 hour prior to test. You may take your regular medications prior to the test.  Take metoprolol  (Lopressor ) 50mg  two hours prior to test. If you take Furosemide/Hydrochlorothiazide /Spironolactone/Chlorthalidone, please HOLD on the morning of the test. Patients who wear  a continuous glucose monitor MUST remove the device prior to scanning.      After the Test: Drink plenty of water . After receiving IV contrast, you may experience a mild flushed feeling. This is normal. On occasion, you may experience a mild rash up to  24 hours after the test. This is not dangerous. If this occurs, you can take Benadryl  25 mg, Zyrtec, Claritin , or Allegra and increase your fluid intake. (Patients taking Tikosyn should avoid Benadryl , and may take Zyrtec, Claritin , or Allegra) If you experience trouble breathing, this can be serious. If it is severe call 911 IMMEDIATELY. If it is mild, please call our office.  We will call to schedule your test 2-4 weeks out understanding that some insurance companies will need an authorization prior to the service being performed.   For more information and frequently asked questions, please visit our website : [email or web address]  For non-scheduling related questions, please contact the cardiac imaging nurse navigator should you have any questions/concerns: Cardiac Imaging Nurse Navigators Direct Office Dial: [phone]   For scheduling needs, including cancellations and rescheduling, please call Brittany, [phone].

## 2023-11-28 NOTE — Assessment & Plan Note (Signed)
 History of essential hypertension blood pressure measured today 124/68.  He is on losartan .

## 2023-11-28 NOTE — Assessment & Plan Note (Signed)
 History of hyperlipidemia on Zetia  and fenofibrate 

## 2023-12-01 IMAGING — DX DG CHEST 1V PORT
1 series · 1 of 1 positions shown · non-contrast
Comparison: None.

CLINICAL DATA: Hiccups for 2 days

EXAM:
PORTABLE CHEST 1 VIEW

[chest]
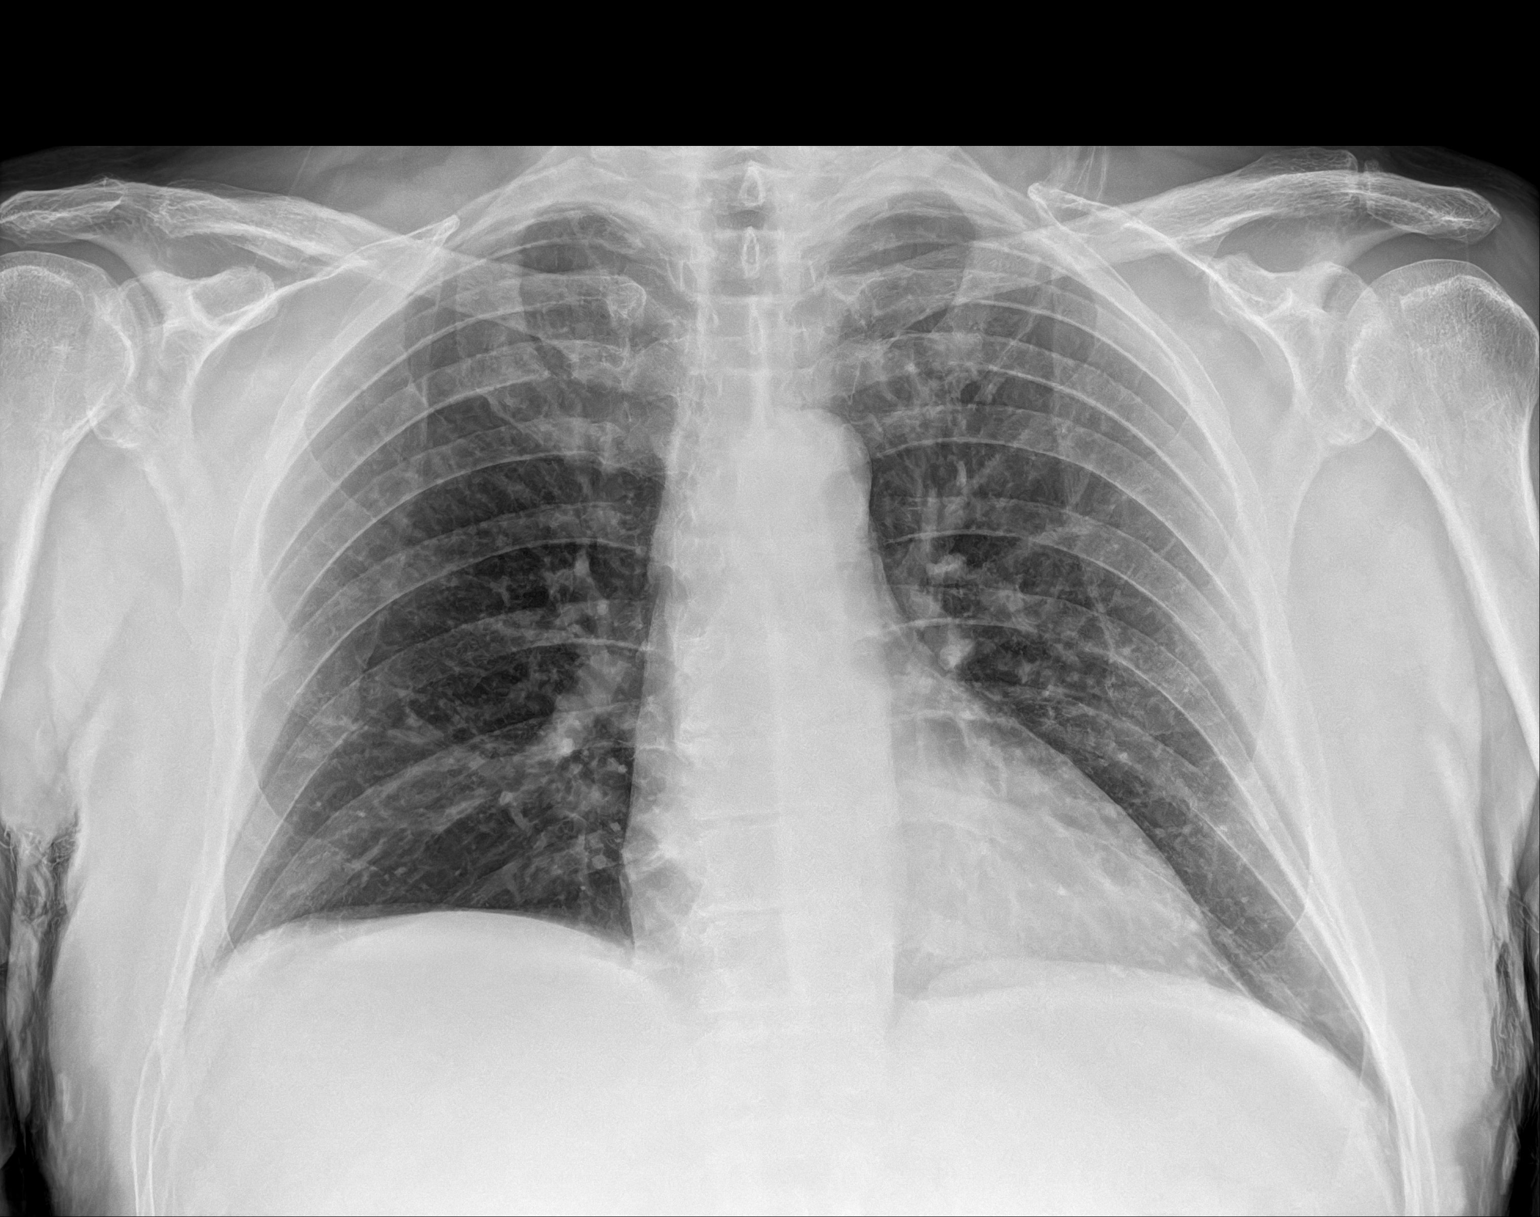

[1 of 1 positions shown; findings below may reference images not displayed]

FINDINGS: The heart size and mediastinal contours are within normal limits.
Both lungs are clear. The visualized skeletal structures are
unremarkable.
IMPRESSION: No active disease.

## 2023-12-05 ENCOUNTER — Ambulatory Visit (HOSPITAL_COMMUNITY)
Admission: RE | Admit: 2023-12-05 | Discharge: 2023-12-05 | Disposition: A | Discharge status: Home / Self Care | Source: Ambulatory Visit | Attending: Cardiovascular Disease | Admitting: Cardiovascular Disease

## 2023-12-05 DIAGNOSIS — R0789 Other chest pain: Secondary | ICD-10-CM | POA: Insufficient documentation

## 2023-12-05 DIAGNOSIS — E782 Mixed hyperlipidemia: Secondary | ICD-10-CM | POA: Diagnosis not present

## 2023-12-05 DIAGNOSIS — R072 Precordial pain: Secondary | ICD-10-CM | POA: Diagnosis not present

## 2023-12-05 DIAGNOSIS — I10 Essential (primary) hypertension: Secondary | ICD-10-CM | POA: Diagnosis not present

## 2023-12-05 LAB — ECHOCARDIOGRAM COMPLETE
Area-P 1/2: 3.83 cm2
S' Lateral: 2.74 cm

## 2023-12-06 ENCOUNTER — Ambulatory Visit: Payer: Self-pay | Admitting: Cardiovascular Disease

## 2023-12-15 ENCOUNTER — Encounter (HOSPITAL_COMMUNITY): Payer: Self-pay

## 2023-12-19 ENCOUNTER — Ambulatory Visit: Payer: Self-pay

## 2023-12-19 ENCOUNTER — Ambulatory Visit (HOSPITAL_COMMUNITY): Admission: RE | Admit: 2023-12-19 | Source: Ambulatory Visit

## 2023-12-19 ENCOUNTER — Other Ambulatory Visit: Payer: Self-pay | Admitting: Family Medicine

## 2023-12-19 MED ORDER — MOLNUPIRAVIR EUA 200MG CAPSULE: 4.0000 | ORAL_CAPSULE | Freq: Two times a day (BID) | ORAL | 0 refills | Status: AC

## 2023-12-19 NOTE — Progress Notes (Signed)
 Done

## 2023-12-19 NOTE — Telephone Encounter (Signed)
 FYI Only or Action Required?: Action required by provider: clinical question for provider. Pt and spouse Covid positive and symptomatic requesting Lagevrio  200 mg as prescribed for him in 2022. Declines both OV and VV.  Patient was last seen in primary care on 10/17/2023 by Johnny Garnette LABOR, MD.  Called Nurse Triage reporting Chest Pain.  Symptoms began several days ago.  Interventions attempted: Rest, hydration, or home remedies.  Symptoms are: gradually worsening.  Triage Disposition: Call PCP Within 24 Hours  Patient/caregiver understands and will follow disposition?: Yes  Reason for Disposition  [1] Chest pain lasts < 5 minutes AND [2] NO chest pain or cardiac symptoms (e.g., breathing difficulty, sweating) now  (Exception: Chest pains that last only a few seconds.)  [1] HIGH RISK patient (e.g., weak immune system, 65 years and older, obesity with BMI 30 or higher, pregnant, chronic lung disease) AND [2] COVID symptoms (e.g., cough, fever)  (Exceptions: Already seen by PCP and no new or worsening symptoms.)  Answer Assessment - Initial Assessment Questions Pt reports this CP is not new or changed, he is currently under the care of Cardiology and has had recent testing. No new complaints at this time. This RN educated pt on home care, new-worsening symptoms, when to call back/seek emergent care. Pt verbalized understanding and agrees to plan.      1. LOCATION: Where does it hurt?       Center, sharp 2. RADIATION: Does the pain go anywhere else? (e.g., into neck, jaw, arms, back)     None 3. ONSET: When did the chest pain begin? (Minutes, hours or days)      Yesterday 4. PATTERN: Does the pain come and go, or has it been constant since it started?  Does it get worse with exertion?      Intermittent 5. DURATION: How long does it last (e.g., seconds, minutes, hours)     Few seconds 6. SEVERITY: How bad is the pain?  (e.g., Scale 1-10; mild, moderate, or severe)      5/10 7. CARDIAC RISK FACTORS: Do you have any history of heart problems or risk factors for heart disease? (e.g., angina, prior heart attack; diabetes, high blood pressure, high cholesterol, smoker, or strong family history of heart disease)     HTN 8. PULMONARY RISK FACTORS: Do you have any history of lung disease?  (e.g., blood clots in lung, asthma, emphysema, birth control pills)     History of PE 9. CAUSE: What do you think is causing the chest pain?     Unknown, Covid positive 10. OTHER SYMPTOMS: Do you have any other symptoms? (e.g., dizziness, nausea, vomiting, sweating, fever, difficulty breathing, cough)       Sweating  Answer Assessment - Initial Assessment Questions 1. SYMPTOMS: What is your main symptom or concern? (e.g., cough, fever, shortness of breath, muscle aches)     Cough, chills, sweats, ST, HA 2. ONSET: When did the symptoms start?      Saturday evening 3. COUGH: Do you have a cough? If Yes, ask: How bad is the cough?       Yes, thick clear sputum 4. FEVER: Do you have a fever? If Yes, ask: What is your temperature, how was it measured, and when did it start?     Chills, sweats 5. BREATHING DIFFICULTY: Are you having any difficulty breathing? (e.g., normal; shortness of breath, wheezing, unable to speak)      Denies 6. BETTER-SAME-WORSE: Are you getting better, staying the same or getting  worse compared to yesterday?  If getting worse, ask, In what way?     Worse 7. OTHER SYMPTOMS: Do you have any other symptoms?  (e.g., chills, fatigue, headache, loss of smell or taste, muscle pain, sore throat)     Fatigue, body aches 8. COVID-19 DIAGNOSIS: How do you know that you have COVID? (e.g., positive lab test or self-test, diagnosed by doctor or NP/PA, symptoms after exposure).     Positive home test 9. COVID-19 EXPOSURE: Was there any known exposure to COVID before the symptoms began?      Unknown, recently returned from vacation 10.  COVID-19 VACCINE: Have you had the COVID-19 vaccine? If Yes, ask: When did you last get it?       No 11. HIGH RISK DISEASE: Do you have any chronic medical problems? (e.g., asthma, heart or lung disease, weak immune system, obesity, etc.)       HTN, Hx of PE  13. O2 SATURATION MONITOR:  Do you use an oxygen saturation monitor (pulse oximeter) at home? If Yes, ask What is your reading (oxygen level) today? What is your usual oxygen saturation reading? (e.g., 95%)       98%  Protocols used: Chest Pain-A-AH, COVID-19 - Diagnosed or Suspected-A-AH

## 2023-12-19 NOTE — Telephone Encounter (Signed)
 Done

## 2024-01-02 ENCOUNTER — Encounter (HOSPITAL_COMMUNITY): Payer: Self-pay

## 2024-01-06 ENCOUNTER — Ambulatory Visit (HOSPITAL_COMMUNITY)
Admission: RE | Admit: 2024-01-06 | Discharge: 2024-01-06 | Disposition: A | Discharge status: Home / Self Care | Source: Ambulatory Visit | Attending: Cardiovascular Disease | Admitting: Cardiovascular Disease

## 2024-01-06 DIAGNOSIS — R072 Precordial pain: Secondary | ICD-10-CM | POA: Diagnosis not present

## 2024-01-06 MED ORDER — IOHEXOL 350 MG/ML SOLN
100.0000 mL | Freq: Once | INTRAVENOUS | Status: AC | PRN
  Administered 2024-01-06: 100 mL via INTRAVENOUS

## 2024-01-06 MED ORDER — NITROGLYCERIN 0.4 MG SL SUBL
0.8000 mg | SUBLINGUAL_TABLET | Freq: Once | SUBLINGUAL | Status: AC
  Administered 2024-01-06: 0.8 mg via SUBLINGUAL

## 2024-01-25 ENCOUNTER — Other Ambulatory Visit (HOSPITAL_COMMUNITY): Payer: Self-pay

## 2024-01-25 MED ORDER — METHYLPREDNISOLONE 4 MG PO TBPK
ORAL_TABLET | ORAL | 0 refills | Status: DC
  Filled 2024-01-25: qty 21, 6d supply, fill #0

## 2024-02-14 ENCOUNTER — Other Ambulatory Visit (HOSPITAL_COMMUNITY): Payer: Self-pay

## 2024-02-14 ENCOUNTER — Ambulatory Visit: Payer: Self-pay

## 2024-02-14 ENCOUNTER — Encounter: Payer: Self-pay | Admitting: Family Medicine

## 2024-02-14 ENCOUNTER — Ambulatory Visit: Admitting: Family Medicine

## 2024-02-14 VITALS — BP 120/70 | HR 78 | Temp 99.1°F | Wt 188.6 lb

## 2024-02-14 DIAGNOSIS — R059 Cough, unspecified: Secondary | ICD-10-CM

## 2024-02-14 DIAGNOSIS — J40 Bronchitis, not specified as acute or chronic: Secondary | ICD-10-CM | POA: Diagnosis not present

## 2024-02-14 LAB — POCT INFLUENZA A/B
Influenza A, POC: NEGATIVE
Influenza B, POC: NEGATIVE

## 2024-02-14 LAB — POC COVID19 BINAXNOW: SARS Coronavirus 2 Ag: NEGATIVE

## 2024-02-14 MED ORDER — TEMAZEPAM 30 MG PO CAPS
30.0000 mg | ORAL_CAPSULE | Freq: Every evening | ORAL | 1 refills | Status: AC | PRN
  Filled 2024-02-14: qty 90, 90d supply, fill #0

## 2024-02-14 MED ORDER — METHYLPREDNISOLONE ACETATE 40 MG/ML IJ SUSP
40.0000 mg | Freq: Once | INTRAMUSCULAR | Status: AC
  Administered 2024-02-14: 40 mg via INTRAMUSCULAR

## 2024-02-14 MED ORDER — NYSTATIN 100000 UNIT/ML MT SUSP
5.0000 mL | Freq: Four times a day (QID) | OROMUCOSAL | 2 refills | Status: AC | PRN
  Filled 2024-02-14: qty 280, 14d supply, fill #0

## 2024-02-14 MED ORDER — METHYLPREDNISOLONE ACETATE 80 MG/ML IJ SUSP
80.0000 mg | Freq: Once | INTRAMUSCULAR | Status: AC
  Administered 2024-02-14: 80 mg via INTRAMUSCULAR

## 2024-02-14 MED ORDER — CLARITHROMYCIN 500 MG PO TABS
500.0000 mg | ORAL_TABLET | Freq: Two times a day (BID) | ORAL | 0 refills | Status: AC
  Filled 2024-02-14: qty 20, 10d supply, fill #0

## 2024-02-14 NOTE — Telephone Encounter (Signed)
 He was seen here this morning for a bronchitis

## 2024-02-14 NOTE — Telephone Encounter (Signed)
 FYI Only or Action Required?: FYI only for provider: ED advised. And refused. Pt wants to be seen by Dr. Johnny this morning. Pt will be there today for appt  for his wife at 11am.  Patient was last seen in primary care on 10/17/2023 by Johnny Garnette LABOR, MD.  Called Nurse Triage reporting Cough and Chest Pain.  Symptoms began today.   Chest pain started this morning. Has had chest pain in the past and saw cardiologist  - no significant findings per pt and wife. Chest pain sharp pain lasting a few minutes - middle of chest and to the left. 6-7/10  Pt has a wet cough, yellow phlegm, sore throat and chest congestion.   Interventions attempted: Nothing.  Symptoms are: gradually worsening.  Triage Disposition: Go to ED Now (or PCP Triage)  Patient/caregiver understands and will follow disposition?: No  - pt refuses ED. Pt will be at clinic this morning with wife for her appt.                              Reason for Triage: Patient's wife Rojelio states patient has a sore throat and congestion, along with coughing up yellow phlegm has been getting worse    Reason for Disposition  [1] Chest pain lasts > 5 minutes AND [2] occurred in past 3 days (72 hours) (Exception: Feels exactly the same as previously diagnosed heartburn and has accompanying sour taste in mouth.)  Answer Assessment - Initial Assessment Questions 1. LOCATION: Where does it hurt?       Center - left 2. RADIATION: Does the pain go anywhere else? (e.g., into neck, jaw, arms, back)     Center of chest and went to the left. 3. ONSET: When did the chest pain begin? (Minutes, hours or days)      This morning 4. PATTERN: Does the pain come and go, or has it been constant since it started?  Does it get worse with exertion?      Comes and goes 5. DURATION: How long does it last (e.g., seconds, minutes, hours)     A couple of minutes 6. SEVERITY: How bad is the pain?  (e.g., Scale 1-10; mild,  moderate, or severe)     7-8/10 7. CARDIAC RISK FACTORS: Do you have any history of heart problems or risk factors for heart disease? (e.g., angina, prior heart attack; diabetes, high blood pressure, high cholesterol, smoker, or strong family history of heart disease)     yes 8. PULMONARY RISK FACTORS: Do you have any history of lung disease?  (e.g., blood clots in lung, asthma, emphysema, birth control pills)     Right now  9. CAUSE: What do you think is causing the chest pain?     unsure 10. OTHER SYMPTOMS: Do you have any other symptoms? (e.g., dizziness, nausea, vomiting, sweating, fever, difficulty breathing, cough)       Coughing up phlegm, sore throat and chest congestion  Protocols used: Chest Pain-A-AH

## 2024-02-14 NOTE — Progress Notes (Signed)
" ° °  Subjective:    Patient ID: Steven [name], male    DOB: 1950-10-20, 74 y.o.   MRN: [id number]  HPI Here for 3 days of ST, burning in the chest, and coughing up yellow sputum. No fever or SOB.    Review of Systems  Constitutional: Negative.   HENT:  Positive for congestion and sore throat. Negative for ear pain, postnasal drip and sinus pressure.   Eyes: Negative.   Respiratory:  Positive for cough and chest tightness. Negative for shortness of breath and wheezing.        Objective:   Physical Exam Constitutional:      Appearance: He is not ill-appearing.     Comments: He is hoarse   HENT:     Right Ear: Tympanic membrane, ear canal and external ear normal.     Left Ear: Tympanic membrane, ear canal and external ear normal.     Nose: Nose normal.     Mouth/Throat:     Pharynx: Posterior oropharyngeal erythema present. No oropharyngeal exudate.  Eyes:     Conjunctiva/sclera: Conjunctivae normal.  Pulmonary:     Effort: Pulmonary effort is normal.     Breath sounds: Rhonchi present. No wheezing or rales.  Lymphadenopathy:     Cervical: No cervical adenopathy.  Neurological:     Mental Status: He is alert.           Assessment & Plan:  Bronchitis, treat with 10 days of Biaxin  and he is given a shot of DepoMedrol.  Garnette Olmsted, MD   "

## 2024-03-01 ENCOUNTER — Encounter: Admitting: Family Medicine
